# Patient Record
Sex: Male | Born: 1960 | Race: Black or African American | Hispanic: No | Marital: Married
Health system: Midwestern US, Academic
[De-identification: ages and names within clinical notes are randomized; demographics above are authoritative.]

## PROBLEM LIST (undated history)

## (undated) DIAGNOSIS — M4800 Spinal stenosis, site unspecified: Secondary | ICD-10-CM

## (undated) DIAGNOSIS — F329 Major depressive disorder, single episode, unspecified: Secondary | ICD-10-CM

## (undated) DIAGNOSIS — M5430 Sciatica, unspecified side: Secondary | ICD-10-CM

## (undated) DIAGNOSIS — F32A Depression, unspecified: Secondary | ICD-10-CM

## (undated) DIAGNOSIS — Z77120 Contact with and (suspected) exposure to mold (toxic): Secondary | ICD-10-CM

## (undated) DIAGNOSIS — K5790 Diverticulosis of intestine, part unspecified, without perforation or abscess without bleeding: Secondary | ICD-10-CM

## (undated) DIAGNOSIS — E785 Hyperlipidemia, unspecified: Secondary | ICD-10-CM

## (undated) DIAGNOSIS — I10 Essential (primary) hypertension: Secondary | ICD-10-CM

## (undated) DIAGNOSIS — M2021 Hallux rigidus, right foot: Secondary | ICD-10-CM

## (undated) HISTORY — PX: KNEE ARTHROSCOPY: SUR90

## (undated) HISTORY — DX: Diverticulosis of intestine, part unspecified, without perforation or abscess without bleeding: K57.90

## (undated) HISTORY — PX: SPINE SURGERY: SHX786

## (undated) HISTORY — PX: BACK SURGERY: SHX140

## (undated) HISTORY — DX: Essential (primary) hypertension: I10

## (undated) HISTORY — DX: Major depressive disorder, single episode, unspecified: F32.9

## (undated) HISTORY — DX: Hyperlipidemia, unspecified: E78.5

## (undated) HISTORY — DX: Depression, unspecified: F32.A

## (undated) HISTORY — DX: Sciatica, unspecified side: M54.30

---

## 2000-01-22 ENCOUNTER — Emergency Department (HOSPITAL_COMMUNITY): Admission: EM | Admit: 2000-01-22 | Discharge: 2000-01-22 | Payer: Self-pay | Admitting: Emergency Medicine

## 2000-01-22 ENCOUNTER — Encounter: Payer: Self-pay | Admitting: Emergency Medicine

## 2000-07-08 ENCOUNTER — Encounter: Payer: Self-pay | Admitting: Emergency Medicine

## 2000-07-08 ENCOUNTER — Emergency Department (HOSPITAL_COMMUNITY): Admission: EM | Admit: 2000-07-08 | Discharge: 2000-07-08 | Payer: Self-pay | Admitting: Emergency Medicine

## 2000-08-23 ENCOUNTER — Ambulatory Visit (HOSPITAL_BASED_OUTPATIENT_CLINIC_OR_DEPARTMENT_OTHER): Admission: RE | Admit: 2000-08-23 | Discharge: 2000-08-23 | Payer: Self-pay | Admitting: Orthopedic Surgery

## 2001-10-17 HISTORY — PX: CERVICAL SPINE SURGERY: SHX589

## 2002-02-07 ENCOUNTER — Emergency Department (HOSPITAL_COMMUNITY): Admission: EM | Admit: 2002-02-07 | Discharge: 2002-02-07 | Payer: Self-pay | Admitting: *Deleted

## 2002-10-11 ENCOUNTER — Emergency Department (HOSPITAL_COMMUNITY): Admission: EM | Admit: 2002-10-11 | Discharge: 2002-10-11 | Payer: Self-pay | Admitting: Emergency Medicine

## 2002-10-26 ENCOUNTER — Encounter: Payer: Self-pay | Admitting: Internal Medicine

## 2002-10-26 ENCOUNTER — Encounter: Admission: RE | Admit: 2002-10-26 | Discharge: 2002-10-26 | Payer: Self-pay | Admitting: Internal Medicine

## 2002-12-10 ENCOUNTER — Encounter: Payer: Self-pay | Admitting: Neurosurgery

## 2002-12-10 ENCOUNTER — Ambulatory Visit (HOSPITAL_COMMUNITY): Admission: RE | Admit: 2002-12-10 | Discharge: 2002-12-10 | Payer: Self-pay | Admitting: Neurosurgery

## 2003-01-23 ENCOUNTER — Emergency Department (HOSPITAL_COMMUNITY): Admission: EM | Admit: 2003-01-23 | Discharge: 2003-01-23 | Payer: Self-pay | Admitting: Emergency Medicine

## 2003-01-23 ENCOUNTER — Encounter: Payer: Self-pay | Admitting: Emergency Medicine

## 2003-01-24 ENCOUNTER — Emergency Department (HOSPITAL_COMMUNITY): Admission: EM | Admit: 2003-01-24 | Discharge: 2003-01-24 | Payer: Self-pay | Admitting: Emergency Medicine

## 2003-02-03 ENCOUNTER — Encounter: Payer: Self-pay | Admitting: Neurosurgery

## 2003-02-05 ENCOUNTER — Ambulatory Visit (HOSPITAL_COMMUNITY): Admission: RE | Admit: 2003-02-05 | Discharge: 2003-02-06 | Payer: Self-pay | Admitting: Neurosurgery

## 2003-02-05 ENCOUNTER — Encounter: Payer: Self-pay | Admitting: Neurosurgery

## 2003-06-20 ENCOUNTER — Emergency Department (HOSPITAL_COMMUNITY): Admission: EM | Admit: 2003-06-20 | Discharge: 2003-06-21 | Payer: Self-pay

## 2003-08-08 ENCOUNTER — Emergency Department (HOSPITAL_COMMUNITY): Admission: EM | Admit: 2003-08-08 | Discharge: 2003-08-08 | Payer: Self-pay | Admitting: Emergency Medicine

## 2005-07-09 ENCOUNTER — Emergency Department (HOSPITAL_COMMUNITY): Admission: EM | Admit: 2005-07-09 | Discharge: 2005-07-09 | Payer: Self-pay | Admitting: Emergency Medicine

## 2005-09-28 ENCOUNTER — Ambulatory Visit: Payer: Self-pay | Admitting: Internal Medicine

## 2006-06-28 ENCOUNTER — Emergency Department (HOSPITAL_COMMUNITY): Admission: EM | Admit: 2006-06-28 | Discharge: 2006-06-29 | Payer: Self-pay | Admitting: Emergency Medicine

## 2006-06-30 ENCOUNTER — Emergency Department (HOSPITAL_COMMUNITY): Admission: EM | Admit: 2006-06-30 | Discharge: 2006-06-30 | Payer: Self-pay | Admitting: *Deleted

## 2006-07-05 ENCOUNTER — Emergency Department (HOSPITAL_COMMUNITY): Admission: EM | Admit: 2006-07-05 | Discharge: 2006-07-05 | Payer: Self-pay | Admitting: Emergency Medicine

## 2006-07-17 ENCOUNTER — Emergency Department (HOSPITAL_COMMUNITY): Admission: EM | Admit: 2006-07-17 | Discharge: 2006-07-17 | Payer: Self-pay | Admitting: Emergency Medicine

## 2007-05-15 ENCOUNTER — Emergency Department (HOSPITAL_COMMUNITY): Admission: EM | Admit: 2007-05-15 | Discharge: 2007-05-15 | Payer: Self-pay | Admitting: Emergency Medicine

## 2007-06-21 ENCOUNTER — Emergency Department (HOSPITAL_COMMUNITY): Admission: EM | Admit: 2007-06-21 | Discharge: 2007-06-21 | Payer: Self-pay | Admitting: Emergency Medicine

## 2007-10-20 ENCOUNTER — Emergency Department (HOSPITAL_COMMUNITY): Admission: EM | Admit: 2007-10-20 | Discharge: 2007-10-20 | Payer: Self-pay | Admitting: Emergency Medicine

## 2008-06-29 ENCOUNTER — Emergency Department (HOSPITAL_COMMUNITY): Admission: EM | Admit: 2008-06-29 | Discharge: 2008-06-29 | Payer: Self-pay | Admitting: Emergency Medicine

## 2011-01-23 ENCOUNTER — Emergency Department (HOSPITAL_COMMUNITY): Payer: Medicare Other

## 2011-01-23 ENCOUNTER — Emergency Department (HOSPITAL_COMMUNITY)
Admission: EM | Admit: 2011-01-23 | Discharge: 2011-01-23 | Disposition: A | Discharge status: Home / Self Care | Payer: Medicare Other | Attending: Emergency Medicine | Admitting: Emergency Medicine

## 2011-01-23 DIAGNOSIS — M25519 Pain in unspecified shoulder: Secondary | ICD-10-CM | POA: Insufficient documentation

## 2011-01-23 DIAGNOSIS — M5412 Radiculopathy, cervical region: Secondary | ICD-10-CM | POA: Insufficient documentation

## 2011-01-23 DIAGNOSIS — F319 Bipolar disorder, unspecified: Secondary | ICD-10-CM | POA: Insufficient documentation

## 2011-01-23 DIAGNOSIS — M542 Cervicalgia: Secondary | ICD-10-CM | POA: Insufficient documentation

## 2011-03-04 NOTE — Op Note (Signed)
[place]  Patient:    Adrian Cox, Adrian Cox                      MRN: [id number] Proc. Date: 08/23/00 Adm. Date:  [id number] Attending:  Alinda Deem                           Operative Report  PREOPERATIVE DIAGNOSIS:  Right knee lateral meniscal tear.  POSTOPERATIVE DIAGNOSIS:  Right knee lateral meniscal tear.  PROCEDURE:  Arthroscopic removal of bucket-handle tear of the right lateral meniscus.  SURGEON:  Alinda Deem, M.D.  FIRST ASSISTANT:  Dorthula Matas, P.A.-C.  ANESTHESIA:  General LMA.  ESTIMATED BLOOD LOSS:  Minimal.  FLUID REPLACEMENT:  800 cc crystalloid.  DRAINS:  None.  TOURNIQUET TIME:  None.  INDICATIONS FOR PROCEDURE:  A 50 year old man who injured his knee a few weeks ago and has had persistent effusions and lateral joint line pain as well as a palpable mass consistent with a bucket handle tear of the lateral meniscus. He is taken for arthroscopic evaluation and treatment of same.  Risks and benefits of surgery understood by the patient.  He has significant pain which prevents normal activities and has an effusion.  DESCRIPTION OF PROCEDURE:  Patient identified by arm band, taken to the operating room at [facility], appropriate anesthetic monitor attached, and general LMA anesthesia induced with the patient in the supine position.  A lateral post was applied to the table and the right lower extremity prepped and draped in the usual sterile fashion from the ankle to the mid-thigh.  We began the procedure by making standard inferomedial and inferolateral peripatellar portals with a #11 blade, allowing introduction of the arthroscope with the inflow attached to the inferolateral portal and the outflow to the inferomedial portal.  The suprapatellar pouch, patella, trochlea, and medial compartment were in excellent condition, as were the cruciate ligaments.  Moving into the lateral compartment,  we immediately identified incarcerated bucket-handle tear of the lateral meniscus that went from posterior medial all the way around to anterolateral, including pretty much the entire rim of the lateral meniscus.  We began the treatment by cutting the attachment of the bucket-handle tear posterior medially, and then we made a supplemental anterolateral portal allowing introduction of a small knife and amputated the lateral limb of the bucket-handle tear, which was then extracted through the inferomedial portal.  A 4.2 mm Great White sucker shaver was then brought up to trim up any loose ends of the meniscal tear, which was smoothed and scalloped as necessary.  The articular cartilage was in excellent condition, and the cruciates were once again evaluated and found to be in good shape.  The gutters were cleared.  The scope was taken medial and lateral to the PCL, looking for any loose bodies in the posterior compartment.  At this point the knee was washed out with normal saline solution, the arthroscopic instruments removed, and a dressing of Xeroform, followed by dressing sponges, Webril, and an Ace wrap applied.  None of the portals were sutured, since none were over a quarter-inch in length.  The patient tolerated this procedure well, was awakened, and taken to the recovery room without difficulty. DD:  08/23/00 TD:  08/24/00 Job: [id number]

## 2011-03-04 NOTE — Op Note (Signed)
NAME:  Adrian Cox, Adrian Cox                         ACCOUNT NO.:  [id number]   MEDICAL RECORD NO.:  [id number]                   PATIENT TYPE:  OIB   LOCATION:  3007                                 FACILITY:  [facility]   PHYSICIAN:  Coletta Memos, M.D.                  DATE OF BIRTH:  22-Sep-1961   DATE OF PROCEDURE:  02/05/2003  DATE OF DISCHARGE:                                 OPERATIVE REPORT   PREOPERATIVE DIAGNOSIS:  1. Displaced disk C6-7 left.  2. Left cervical radiculopathy.   POSTOPERATIVE DIAGNOSIS:  1. Displaced disk C6-7 left.  2. Left cervical radiculopathy.   OPERATION PERFORMED:  Anterior cervical decompression of C6-7, arthrodesis  C6-7 with interbody plug, anterior plating Synthes small stature 18 mm  plate.   SURGEON:  Coletta Memos, M.D.   ANESTHESIA:  General.   ASSISTANT:  Hewitt Shorts, M.D.   COMPLICATIONS:  None.   INDICATIONS FOR PROCEDURE:  The patient is a 50 year old with a large disk  at C6-7 on the left side causing left upper extremity pain.  After  conservative treatment has failed and after consideration on his part, he  has agreed to undergo an anterior cervical decompression and arthrodesis.   DESCRIPTION OF PROCEDURE:  The patient was then draped in sterile fashion.  I opened the skin incision with a #10 blade and took this down to the  platysma.  I opened the platysma in a horizontal fashion using Metzenbaum  scissors in line with the incision.  The incision was made from the midline  to the medial border of the left sternocleidomastoid muscle approximately  three fingerbreadths above the sternal notch.  After dissecting rostrally  and caudally underneath the platysma, I then created an avascular plane  between the medial strap muscles and the sternocleidomastoid muscle  laterally.  The carotid artery was brought to the lateral aspect of the  wound.  I identified the anterior cervical spine and placed a spinal needle.  It showed that I  was at C6-7.  I then opened the disk space to mark it.  I  then resected the longus colli muscles using monopolar cautery.  Placed self-  retaining Caspar retractors to expose the disk space.  I then used a 15  blade to remove more of the disk and end plate and then curets.  Pituitary  rongeur was also used as was the Kerrison punches.  I then placed  distraction pins one in C6, one in C7 and opened the disk space further.  I  brought the microscope into the field and using that, removed yet more disk  material.  I used high speed air drill to remove osteophytes.  I  decompressed the nerve roots.  I was able to inspect and felt that both  nerve roots were very well decompressed.  I then fashioned the end plates  using a barrel-shaped bur and a drill.  I then placed an 8 mm bone graft  into the space.  The plate and screws were then placed, two screws in C6,  two screws in C7 first by drilling and tapping, locking screws placed.  X-  ray showed the plate and plug to be in good  position.  I then irrigated the wound.  I then closed the wound in layered  fashion using Vicryl sutures to reapproximate platysma, then subcutaneous  tissues.  Sterile dressing with Dermabond.  The patient tolerated the  procedure without difficulty.  Awakened he was moving all extremities.                                                Coletta Memos, M.D.    KC/MEDQ  D:  02/05/2003  T:  02/05/2003  Job:  [id number]

## 2011-07-03 ENCOUNTER — Emergency Department (HOSPITAL_COMMUNITY)
Admission: EM | Admit: 2011-07-03 | Discharge: 2011-07-03 | Disposition: A | Discharge status: Home / Self Care | Payer: Medicare Other | Attending: Emergency Medicine | Admitting: Emergency Medicine

## 2011-07-03 DIAGNOSIS — M25519 Pain in unspecified shoulder: Secondary | ICD-10-CM | POA: Insufficient documentation

## 2011-07-03 DIAGNOSIS — F319 Bipolar disorder, unspecified: Secondary | ICD-10-CM | POA: Insufficient documentation

## 2011-07-03 DIAGNOSIS — Z79899 Other long term (current) drug therapy: Secondary | ICD-10-CM | POA: Insufficient documentation

## 2011-12-21 ENCOUNTER — Ambulatory Visit (INDEPENDENT_AMBULATORY_CARE_PROVIDER_SITE_OTHER): Payer: Medicare Other | Admitting: Family Medicine

## 2011-12-21 VITALS — BP 146/88 | HR 94 | Temp 98.3°F | Resp 20 | Ht 72.5 in | Wt 224.0 lb

## 2011-12-21 DIAGNOSIS — K625 Hemorrhage of anus and rectum: Secondary | ICD-10-CM

## 2011-12-21 DIAGNOSIS — R5381 Other malaise: Secondary | ICD-10-CM

## 2011-12-21 DIAGNOSIS — B356 Tinea cruris: Secondary | ICD-10-CM

## 2011-12-21 DIAGNOSIS — I10 Essential (primary) hypertension: Secondary | ICD-10-CM

## 2011-12-21 DIAGNOSIS — K639 Disease of intestine, unspecified: Secondary | ICD-10-CM

## 2011-12-21 DIAGNOSIS — I517 Cardiomegaly: Secondary | ICD-10-CM

## 2011-12-21 DIAGNOSIS — R5383 Other fatigue: Secondary | ICD-10-CM

## 2011-12-21 DIAGNOSIS — E785 Hyperlipidemia, unspecified: Secondary | ICD-10-CM

## 2011-12-21 LAB — POCT CBC
HCT, POC: 47.7 % (ref 43.5–53.7)
Lymph, poc: 3 (ref 0.6–3.4)
MCH, POC: 31.1 pg (ref 27–31.2)
MCHC: 31.7 g/dL — AB (ref 31.8–35.4)
MCV: 98.3 fL — AB (ref 80–97)
POC Granulocyte: 10 — AB (ref 2–6.9)
POC LYMPH PERCENT: 22.1 %L (ref 10–50)
RDW, POC: 13.8 %
WBC: 13.7 10*3/uL — AB (ref 4.6–10.2)

## 2011-12-21 LAB — POCT UA - MICROSCOPIC ONLY
Casts, Ur, LPF, POC: NEGATIVE
Crystals, Ur, HPF, POC: NEGATIVE
Mucus, UA: NEGATIVE
WBC, Ur, HPF, POC: NEGATIVE
Yeast, UA: NEGATIVE

## 2011-12-21 LAB — POCT URINALYSIS DIPSTICK
Blood, UA: NEGATIVE
Leukocytes, UA: NEGATIVE
Nitrite, UA: NEGATIVE
Urobilinogen, UA: 1
pH, UA: 5

## 2011-12-21 MED ORDER — TERBINAFINE HCL 250 MG PO TABS: 250.0000 mg | ORAL_TABLET | Freq: Every day | ORAL | Status: AC

## 2011-12-21 MED ORDER — BUDESONIDE 32 MCG/ACT NA SUSP: 2.0000 | Freq: Every day | NASAL | Status: DC

## 2011-12-21 NOTE — Progress Notes (Signed)
History  Adrian Cox is here for his first time. He is 51 years of age. He is on a couple of medicines for his nerves, Abilify and Wellbutrin, and has had problems with depression and bipolar disorder. He is on disability, and is very sedentary in his life. He just does not have energy to do anything much. He is to keep medical problems today primarily involve a rash in his groin and in his tiredness. He has problem with tingling in his shoulders, having had a history of cervical disc disease and a titanium plate in his neck.  Past medical history: History of high blood pressure, high cholesterol, depression, and bipolar. History of several surgeries on his spine and knees  Social history: He is married no children does not smoke drink or use drugs has a high school education, not employed, does not do regular exercise although he does walk some.  Family history both parents are deceased. Father died from dementia dementia, and his mother had colon cancer.  Review of systems: Patient has had a previous colonoscopy in 2008 or blood in his stool He has some general weakness. He has night sweats at times. He does have some problems sleeping. He has lost some weight. HEENT: He has had some problems with his sinuses. He does wear glasses. His last eye exam was one year ago. Respiratory unremarkable Cardiovascular unremarkable Gastrointestinal: Appetite is diminished from what it used to be. He has been seeing some blood in his stool. Sometimes it is when he wipes, but there is more on the stool and in the toilet bowel. He gets some cramping abdominal pain. He has had a history of some constipation. No history of diabetes or thyroid problems.  Genitourinary is normal with the except of a shunt of some sexual difficulties. Musculoskeletal he has the numbness of his shoulders and upper arm portions as noted above. Neurologic normal Psychiatric carries the diagnosis of bipolar as noted above. Hematologic  unremarkable.  Physical examination  pleasant alert Afro-American male has a hand full of dread locks and a white beard. Eyes PERRLA throat clear neck supple without significant nodes or thyromegaly no carotid bruits. Chest is clear. Heart regular without murmurs. Abdomen soft without organomegaly masses or tenderness. Normal male external genitalia. Has some jock itch type rash. Digital rectal exam reveals prostate gland is normal. A smear was taken for hemasure Extremities unremarkable.  Assessment: Fatigue Tinea cruris Bipolar Sleep disturbance Chronic sinusitis Decreased appetite Right rectal bleeding Cervical discomfort The EEG History of high blood pressure History of high cholesterol  Plan: Check labs. Treat the rash. Discussed the cervical discomfort.  Results for orders placed in visit on 12/21/11  POCT CBC      Component Value Range   WBC 13.7 (*) 4.6 - 10.2 (K/uL)   Lymph, poc 3.0  0.6 - 3.4    POC LYMPH PERCENT 22.1  10 - 50 (%L)   MID (cbc) 0.7  0 - 0.9    POC MID % 5.0  0 - 12 (%M)   POC Granulocyte 10.0 (*) 2 - 6.9    Granulocyte percent 72.9  37 - 80 (%G)   RBC 4.85  4.69 - 6.13 (M/uL)   Hemoglobin 15.1  14.1 - 18.1 (g/dL)   HCT, POC 45.4  09.8 - 53.7 (%)   MCV 98.3 (*) 80 - 97 (fL)   MCH, POC 31.1  27 - 31.2 (pg)   MCHC 31.7 (*) 31.8 - 35.4 (g/dL)   RDW,  POC 13.8     Platelet Count, POC 263  142 - 424 (K/uL)   MPV 10.4  0 - 99.8 (fL)  POCT UA - MICROSCOPIC ONLY      Component Value Range   WBC, Ur, HPF, POC neg     RBC, urine, microscopic 0-1     Bacteria, U Microscopic neg     Mucus, UA neg     Epithelial cells, urine per micros 0-1     Crystals, Ur, HPF, POC neg     Casts, Ur, LPF, POC neg     Yeast, UA neg    POCT URINALYSIS DIPSTICK      Component Value Range   Color, UA yellow     Clarity, UA clear     Glucose, UA neg     Bilirubin, UA neg     Ketones, UA trace     Spec Grav, UA 1.020     Blood, UA neg     pH, UA 5.0     Protein, UA  trace     Urobilinogen, UA 1.0     Nitrite, UA neg     Leukocytes, UA Negative    POCT GLYCOSYLATED HEMOGLOBIN (HGB A1C)      Component Value Range   Hemoglobin A1C 5.0    IFOBT (OCCULT BLOOD)      Component Value Range   IFOBT Positive

## 2011-12-21 NOTE — Patient Instructions (Signed)
We are referring you to a cardiologist for evaluation of your heart. You have evidence of possible enlargement of the heart and this should be checked.  We are referring you to a gastroenterologist for reevaluation of your colon since there is blood in your stool.  Take the medications one daily for 2 weeks for the jock itch.

## 2011-12-22 ENCOUNTER — Encounter: Payer: Self-pay | Admitting: Family Medicine

## 2011-12-22 LAB — LIPID PANEL
Cholesterol: 181 mg/dL (ref 0–200)
HDL: 30 mg/dL — ABNORMAL LOW (ref >39)
Total CHOL/HDL Ratio: 6 Ratio
VLDL: 45 mg/dL — ABNORMAL HIGH (ref 0–40)

## 2011-12-22 LAB — COMPREHENSIVE METABOLIC PANEL
ALT: 30 U/L (ref 0–53)
AST: 25 U/L (ref 0–37)
BUN: 9 mg/dL (ref 6–23)
Calcium: 9.9 mg/dL (ref 8.4–10.5)
Creat: 0.92 mg/dL (ref 0.50–1.35)
Total Bilirubin: 0.9 mg/dL (ref 0.3–1.2)

## 2011-12-22 LAB — PSA: PSA: 1.2 ng/mL (ref <=4.00)

## 2011-12-22 LAB — TSH: TSH: 0.62 u[IU]/mL (ref 0.350–4.500)

## 2011-12-27 ENCOUNTER — Telehealth: Payer: Self-pay

## 2011-12-27 DIAGNOSIS — K625 Hemorrhage of anus and rectum: Secondary | ICD-10-CM

## 2011-12-27 NOTE — Telephone Encounter (Signed)
.  [facility] PT STATES HE WAS REFERRED TO HAVE A COLONOSPY DONE BUT THEY DON'T TAKE MEDICARE COMPLETE WOULD LIKE TO HAVE ANOTHER REFERRAL. PLEASE CALL [phone]

## 2011-12-28 NOTE — Telephone Encounter (Signed)
Dr Alwyn Ren please make another choice for this patient to have a colonoscopy that accept medicare complete

## 2012-01-02 NOTE — Telephone Encounter (Signed)
Can we order the referral for pt?

## 2012-01-02 NOTE — Telephone Encounter (Signed)
Spoke with patient, he states that he does not need new referral-- he is going to Dr. Elnoria Howard.  Pls d/c recent request.

## 2012-01-02 NOTE — Telephone Encounter (Signed)
Pt can go anywhere that takes his insurance.  I did the referral.

## 2012-01-02 NOTE — Telephone Encounter (Signed)
[place]?  I really do not care which place if we can find one that meets his needs.

## 2012-08-10 ENCOUNTER — Ambulatory Visit (INDEPENDENT_AMBULATORY_CARE_PROVIDER_SITE_OTHER): Payer: Medicare Other | Admitting: Family Medicine

## 2012-08-10 VITALS — BP 134/76 | HR 92 | Temp 98.4°F | Resp 18 | Ht 72.0 in | Wt 238.0 lb

## 2012-08-10 DIAGNOSIS — M549 Dorsalgia, unspecified: Secondary | ICD-10-CM

## 2012-08-10 DIAGNOSIS — N529 Male erectile dysfunction, unspecified: Secondary | ICD-10-CM

## 2012-08-10 DIAGNOSIS — J309 Allergic rhinitis, unspecified: Secondary | ICD-10-CM

## 2012-08-10 MED ORDER — SILDENAFIL CITRATE 100 MG PO TABS: 50.0000 mg | ORAL_TABLET | Freq: Every day | ORAL | Status: DC | PRN

## 2012-08-10 MED ORDER — MELOXICAM 7.5 MG PO TABS: 7.5000 mg | ORAL_TABLET | Freq: Every day | ORAL | Status: DC

## 2012-08-10 MED ORDER — FLUTICASONE PROPIONATE 50 MCG/ACT NA SUSP: 2.0000 | Freq: Every day | NASAL | Status: DC

## 2012-08-10 MED ORDER — CYCLOBENZAPRINE HCL 10 MG PO TABS: 10.0000 mg | ORAL_TABLET | Freq: Every evening | ORAL | Status: DC | PRN

## 2012-08-10 NOTE — Progress Notes (Signed)
[facility]:  Office Visit  Chief Complaint:  Chief Complaint  Patient presents with  . Sinus Problem  . Back Pain    lower back (esp when twist or bend)  . Erectile Dysfunction    HPI: Adrian Cox is a 51 y.o. male who complains of: 1. Sinusitis and rhinorhea sxs on rhinocort but needs something less expensive b/c of cost. + sinus congestion. Stopped taking it after March.  2. Erectile dysfunction x 1 month, has had problems with getting erection and maintaing erection. Deneis diabetes. Has been on Abilify for 2 years for Bipolar.  3. Back pain x 1 month, does a lot of bending and works at [employer]. Right lower back without numbness or tingling. OTC NSAID. Had back surgery 20 years ago. Had spinal surgery, lower back disc removed.   Past Medical History  Diagnosis Date  . Hypertension   . Hyperlipidemia   . Depression     bipolar   Past Surgical History  Procedure Date  . Spine surgery   . Knees   . Cervical spine surgery 2003    fusion   History   Social History  . Marital Status: Married    Spouse Name: N/A    Number of Children: N/A  . Years of Education: N/A   Social History Main Topics  . Smoking status: Never Smoker   . Smokeless tobacco: None  . Alcohol Use: No  . Drug Use: No  . Sexually Active: Yes   Other Topics Concern  . None   Social History Narrative   Patient is married, has no children. He does not smoke drink or use any substances. He has a high school education. He is currently not employed. He does some walking, but really does not get a lot of regular exercise. He has membership at [employer] but he doesn't use it   Family History  Problem Relation Age of Onset  . Cancer Mother    No Known Allergies Prior to Admission medications   Medication Sig Start Date End Date Taking? Authorizing Provider  ARIPiprazole (ABILIFY) 10 MG tablet Take 10 mg by mouth daily.   Yes Historical Provider, MD  chlorthalidone (HYGROTON)  25 MG tablet Take 25 mg by mouth daily.   Yes Historical Provider, MD  budesonide (RHINOCORT AQUA) 32 MCG/ACT nasal spray Place 2 sprays into the nose daily. 12/21/11 12/20/12  Peyton Najjar, MD  buPROPion (WELLBUTRIN) 100 MG tablet Take 100 mg by mouth 2 (two) times daily.    Historical Provider, MD  terbinafine (LAMISIL) 250 MG tablet Take 1 tablet (250 mg total) by mouth daily. 12/21/11 12/20/12  Peyton Najjar, MD     ROS: The patient denies fevers, chills, night sweats, unintentional weight loss, chest pain, palpitations, wheezing, dyspnea on exertion, nausea, vomiting, abdominal pain, dysuria, hematuria, melena, numbness, weakness, or tingling.   All other systems have been reviewed and were otherwise negative with the exception of those mentioned in the HPI and as above.    PHYSICAL EXAM: Filed Vitals:   08/10/12 1353  BP: 134/76  Pulse: 92  Temp: 98.4 F (36.9 C)  Resp: 18   Filed Vitals:   08/10/12 1353  Height: 6' (1.829 m)  Weight: 238 lb (107.956 kg)   Body mass index is 32.28 kg/(m^2).  General: Alert, no acute distress HEENT:  Normocephalic, atraumatic, oropharynx patent.  Cardiovascular:  Regular rate and rhythm, no rubs murmurs or gallops.  No Carotid bruits, radial  pulse intact. No pedal edema.  Respiratory: Clear to auscultation bilaterally.  No wheezes, rales, or rhonchi.  No cyanosis, no use of accessory musculature GI: No organomegaly, abdomen is soft and non-tender, positive bowel sounds.  No masses. Skin: No rashes. Neurologic: Facial musculature symmetric. Psychiatric: Patient is appropriate throughout our interaction. Lymphatic: No cervical lymphadenopathy Musculoskeletal: Gait intact. Right para spinal msk tenderness ROMintact but sight pain with ROM 2/2 DTR 5/5 Strength Bilateral hips nl Straight leg + right   LABS: Results for orders placed in visit on 12/21/11  POCT CBC      Component Value Range   WBC 13.7 (*) 4.6 - 10.2 K/uL   Lymph, poc 3.0   0.6 - 3.4   POC LYMPH PERCENT 22.1  10 - 50 %L   MID (cbc) 0.7  0 - 0.9   POC MID % 5.0  0 - 12 %M   POC Granulocyte 10.0 (*) 2 - 6.9   Granulocyte percent 72.9  37 - 80 %G   RBC 4.85  4.69 - 6.13 M/uL   Hemoglobin 15.1  14.1 - 18.1 g/dL   HCT, POC 16.1  09.6 - 53.7 %   MCV 98.3 (*) 80 - 97 fL   MCH, POC 31.1  27 - 31.2 pg   MCHC 31.7 (*) 31.8 - 35.4 g/dL   RDW, POC 04.5     Platelet Count, POC 263  142 - 424 K/uL   MPV 10.4  0 - 99.8 fL  POCT UA - MICROSCOPIC ONLY      Component Value Range   WBC, Ur, HPF, POC neg     RBC, urine, microscopic 0-1     Bacteria, U Microscopic neg     Mucus, UA neg     Epithelial cells, urine per micros 0-1     Crystals, Ur, HPF, POC neg     Casts, Ur, LPF, POC neg     Yeast, UA neg    POCT URINALYSIS DIPSTICK      Component Value Range   Color, UA yellow     Clarity, UA clear     Glucose, UA neg     Bilirubin, UA neg     Ketones, UA trace     Spec Grav, UA 1.020     Blood, UA neg     pH, UA 5.0     Protein, UA trace     Urobilinogen, UA 1.0     Nitrite, UA neg     Leukocytes, UA Negative    COMPREHENSIVE METABOLIC PANEL      Component Value Range   Sodium 141  135 - 145 mEq/L   Potassium 4.4  3.5 - 5.3 mEq/L   Chloride 103  96 - 112 mEq/L   CO2 26  19 - 32 mEq/L   Glucose, Bld 93  70 - 99 mg/dL   BUN 9  6 - 23 mg/dL   Creat 4.09  8.11 - 9.14 mg/dL   Total Bilirubin 0.9  0.3 - 1.2 mg/dL   Alkaline Phosphatase 86  39 - 117 U/L   AST 25  0 - 37 U/L   ALT 30  0 - 53 U/L   Total Protein 8.0  6.0 - 8.3 g/dL   Albumin 4.7  3.5 - 5.2 g/dL   Calcium 9.9  8.4 - 78.2 mg/dL  LIPID PANEL      Component Value Range   Cholesterol 181  0 - 200 mg/dL   Triglycerides 956 (*) <  150 mg/dL   HDL 30 (*) >16 mg/dL   Total CHOL/HDL Ratio 6.0     VLDL 45 (*) 0 - 40 mg/dL   LDL Cholesterol 109 (*) 0 - 99 mg/dL  TSH      Component Value Range   TSH 0.620  0.350 - 4.500 uIU/mL  PSA      Component Value Range   PSA 1.20  <=4.00 ng/mL  POCT  GLYCOSYLATED HEMOGLOBIN (HGB A1C)      Component Value Range   Hemoglobin A1C 5.0    IFOBT (OCCULT BLOOD)      Component Value Range   IFOBT Positive       EKG/XRAY:   Primary read interpreted by Dr. Conley Rolls at [facility].   ASSESSMENT/PLAN: Encounter Diagnoses  Name Primary?  . Back pain Yes  . Allergic rhinitis   . Erectile dysfunction    Rx Flonase preferred since will be covered by Medicare Rx Mobic with precuations, ROM exercises given, f/u in 2-4 weeks if no improvement or worsening sxs. No xrays for now since just msk sprain/strain. Rx Viagra for ED.    Hamilton Capri PHUONG, DO 08/10/2012 2:42 PM

## 2012-09-07 ENCOUNTER — Ambulatory Visit (INDEPENDENT_AMBULATORY_CARE_PROVIDER_SITE_OTHER): Payer: Medicare Other | Admitting: Family Medicine

## 2012-09-07 ENCOUNTER — Ambulatory Visit: Payer: Medicare Other

## 2012-09-07 VITALS — BP 128/79 | HR 90 | Temp 98.1°F | Resp 18 | Ht 72.0 in | Wt 235.8 lb

## 2012-09-07 DIAGNOSIS — R05 Cough: Secondary | ICD-10-CM

## 2012-09-07 DIAGNOSIS — R042 Hemoptysis: Secondary | ICD-10-CM

## 2012-09-07 DIAGNOSIS — R059 Cough, unspecified: Secondary | ICD-10-CM

## 2012-09-07 DIAGNOSIS — J40 Bronchitis, not specified as acute or chronic: Secondary | ICD-10-CM

## 2012-09-07 LAB — POCT CBC
Hemoglobin: 15.5 g/dL (ref 14.1–18.1)
Lymph, poc: 4.5 — AB (ref 0.6–3.4)
MCH, POC: 30.9 pg (ref 27–31.2)
MCHC: 31.6 g/dL — AB (ref 31.8–35.4)
MID (cbc): 0.7 (ref 0–0.9)
MPV: 10.2 fL (ref 0–99.8)
POC Granulocyte: 9.7 — AB (ref 2–6.9)
POC MID %: 4.5 %M (ref 0–12)
Platelet Count, POC: 317 10*3/uL (ref 142–424)
WBC: 14.9 10*3/uL — AB (ref 4.6–10.2)

## 2012-09-07 MED ORDER — ALBUTEROL SULFATE HFA 108 (90 BASE) MCG/ACT IN AERS: 2.0000 | INHALATION_SPRAY | Freq: Four times a day (QID) | RESPIRATORY_TRACT | Status: DC | PRN

## 2012-09-07 MED ORDER — LEVOFLOXACIN 500 MG PO TABS: 500.0000 mg | ORAL_TABLET | Freq: Every day | ORAL | Status: DC

## 2012-09-07 MED ORDER — BENZONATATE 100 MG PO CAPS: ORAL_CAPSULE | ORAL | Status: DC

## 2012-09-07 NOTE — Patient Instructions (Addendum)
Drink plenty of fluids  Get sufficient rest  Use the medications as ordered  Return if in all worse or if coughing up blood persists

## 2012-09-07 NOTE — Progress Notes (Signed)
Subjective: 51 year old man with history of having a cough for almost 2 weeks. He's been coughing hard at times. Today he has had about 3 instances of coughing up red blood. He does not smoke, and has not been a smoker. He is not until excessive number of coughs. He has not had your pain or head congestion or sore throat to speak of. He has not been feverish.  On disability for a metal plate in his neck.  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  Has a daughter and wife with him.  Objective: Afro-American male in no major acute distress. TMs are normal. Throat has minimal erythema on the right side, probably from a heart coughing. Neck was supple without nodes. Chest is clear to auscultation. He has minimal wheezes at end expiration of forced expiration, and it triggers a cough. Heart regular without murmurs  Assessment: Hemoptysis Cough History of metal plate and neck  Plan: Chest x-ray and CBC  [facility] reading (PRIMARY) by  Dr. Alwyn Ren Negative chest  Results for orders placed in visit on 09/07/12  POCT CBC      Component Value Range   WBC 14.9 (*) 4.6 - 10.2 K/uL   Lymph, poc 4.5 (*) 0.6 - 3.4   POC LYMPH PERCENT 30.4  10 - 50 %L   MID (cbc) 0.7  0 - 0.9   POC MID % 4.5  0 - 12 %M   POC Granulocyte 9.7 (*) 2 - 6.9   Granulocyte percent 65.1  37 - 80 %G   RBC 5.02  4.69 - 6.13 M/uL   Hemoglobin 15.5  14.1 - 18.1 g/dL   HCT, POC 45.4  09.8 - 53.7 %   MCV 97.7 (*) 80 - 97 fL   MCH, POC 30.9  27 - 31.2 pg   MCHC 31.6 (*) 31.8 - 35.4 g/dL   RDW, POC 11.9     Platelet Count, POC 317  142 - 424 K/uL   MPV 10.2  0 - 99.8 fL    Impression: Bronchitis Hemoptysisi  rx abx If blood persists rtc.

## 2013-08-22 ENCOUNTER — Other Ambulatory Visit: Payer: Self-pay | Admitting: Family Medicine

## 2013-12-27 ENCOUNTER — Emergency Department (INDEPENDENT_AMBULATORY_CARE_PROVIDER_SITE_OTHER)
Admission: EM | Admit: 2013-12-27 | Discharge: 2013-12-27 | Disposition: A | Discharge status: Home / Self Care | Payer: Medicare Other | Source: Home / Self Care | Attending: Emergency Medicine | Admitting: Emergency Medicine

## 2013-12-27 ENCOUNTER — Encounter (HOSPITAL_COMMUNITY): Payer: Self-pay | Admitting: Emergency Medicine

## 2013-12-27 DIAGNOSIS — M545 Low back pain, unspecified: Secondary | ICD-10-CM

## 2013-12-27 HISTORY — DX: Contact with and (suspected) exposure to mold (toxic): Z77.120

## 2013-12-27 LAB — POCT URINALYSIS DIP (DEVICE)
BILIRUBIN URINE: NEGATIVE
Glucose, UA: NEGATIVE mg/dL
HGB URINE DIPSTICK: NEGATIVE
KETONES UR: NEGATIVE mg/dL
Leukocytes, UA: NEGATIVE
Nitrite: NEGATIVE
PH: 7.5 (ref 5.0–8.0)
Protein, ur: NEGATIVE mg/dL
SPECIFIC GRAVITY, URINE: 1.015 (ref 1.005–1.030)
Urobilinogen, UA: 1 mg/dL (ref 0.0–1.0)

## 2013-12-27 MED ORDER — HYDROCODONE-ACETAMINOPHEN 5-325 MG PO TABS: ORAL_TABLET | ORAL | Status: DC

## 2013-12-27 MED ORDER — CYCLOBENZAPRINE HCL 5 MG PO TABS: 5.0000 mg | ORAL_TABLET | Freq: Three times a day (TID) | ORAL | Status: DC | PRN

## 2013-12-27 MED ORDER — DICLOFENAC SODIUM 75 MG PO TBEC: 75.0000 mg | DELAYED_RELEASE_TABLET | Freq: Two times a day (BID) | ORAL | Status: DC

## 2013-12-27 NOTE — Discharge Instructions (Signed)
Do exercises twice daily followed by moist heat for 15 minutes. ° ° ° ° ° °Try to be as active as possible. ° °If no better in 2 weeks, follow up with orthopedist. ° ° °

## 2013-12-27 NOTE — ED Notes (Signed)
Describes a "soreness" in right hip intermittently x 1 month with some low back pain; yesterday after using treadmill pain was worse than usual, but continues to be intermittent.  C/O frontal HA, night sweats (but describes 80 degree house with heavy covers).  Has tried soaking in hot tub and ASA.  Has hx of HTN - has been on & off meds for several years; was most recently taken off meds 1 yr ago per PCP.

## 2013-12-27 NOTE — ED Provider Notes (Signed)
Chief Complaint   Chief Complaint  Patient presents with  . Back Pain    History of Present Illness   Adrian Cox is a 53 year old male who has had a long-standing history of lower back pain going back to 2003 when he had a spinal fusion. He's had 2 surgeries on his lower back, one done by Dr. Doristine SectionVincent [name] and 1 by Dr. Barbaraann BarthelKyle Cabell. The pain has been controlled up until the last 2-3 days when it got worse after using a treadmill at his apartment complex. Right now the pain is rated 2-3/10 in intensity. His legs feel tired and tight. The pain is worse with prolonged sitting, when he stands up or use a treadmill and is better if he moves around. He denies any numbness or tingling in the legs, muscle weakness, bladder or bowel dysfunction, or loss of control of bladder or bowel. He has had some headaches. Denies any abdominal pain, fever, chills, or weight loss.  Review of Systems   Other than as noted above, the patient denies any of the following symptoms: Systemic:  No fever, chills, or unexplained weight loss. GI:  No abdominal painor incontinence of bowel. GU:  No dysuria, frequency, urgency, or hematuria. No incontinence of urine or urinary retention.  M-S:  No neck pain or arthritis. Neuro:  No paresthesias, headache, saddle anesthesia, muscular weakness, or progressive neurological deficit.  PMFSH   Past medical history, family history, social history, meds, and allergies were reviewed. Specifically, there is no history of cancer, major trauma, osteoporosis, immunosuppression, or HIV infection. He has high blood pressure.  Physical Examination    Vital signs:  BP 151/92  Pulse 96  Temp(Src) 98.6 F (37 C) (Oral)  Resp 16  SpO2 94% General:  Alert, oriented, in no distress. Abdomen:  Soft, non-tender.  No organomegaly or mass.  No pulsatile midline abdominal mass or bruit. Back:  His back has 90 of flexion, 20 of extension, 30 of lateral bending, and 90 of rotation with  pain. Straight leg raising is negative. Neuro:  Normal muscle strength, sensations and DTRs. Extremities: Pedal pulses were full, there was no edema. Skin:  Clear, warm and dry.  No rash.  Labs   Results for orders placed during the hospital encounter of 12/27/13  POCT URINALYSIS DIP (DEVICE)      Result Value Ref Range   Glucose, UA NEGATIVE  NEGATIVE mg/dL   Bilirubin Urine NEGATIVE  NEGATIVE   Ketones, ur NEGATIVE  NEGATIVE mg/dL   Specific Gravity, Urine 1.015  1.005 - 1.030   Hgb urine dipstick NEGATIVE  NEGATIVE   pH 7.5  5.0 - 8.0   Protein, ur NEGATIVE  NEGATIVE mg/dL   Urobilinogen, UA 1.0  0.0 - 1.0 mg/dL   Nitrite NEGATIVE  NEGATIVE   Leukocytes, UA NEGATIVE  NEGATIVE    Assessment   The encounter diagnosis was Lumbago.  No evidence of cauda equina syndrome. He may have spinal stenosis and I recommended a followup with Dr. Mikal Planeabell or Dr. Renae FicklePaul if no improvement in 2 weeks.  Plan     1.  Meds:  The following meds were prescribed:   Discharge Medication List as of 12/27/2013  7:20 PM    START taking these medications   Details  !! cyclobenzaprine (FLEXERIL) 5 MG tablet Take 1 tablet (5 mg total) by mouth 3 (three) times daily as needed for muscle spasms., Starting 12/27/2013, Until Discontinued, Normal    diclofenac (VOLTAREN) 75 MG EC tablet  Take 1 tablet (75 mg total) by mouth 2 (two) times daily., Starting 12/27/2013, Until Discontinued, Normal    HYDROcodone-acetaminophen (NORCO/VICODIN) 5-325 MG per tablet 1 to 2 tabs every 4 to 6 hours as needed for pain., Print     !! - Potential duplicate medications found. Please discuss with provider.      2.  Patient Education/Counseling:  The patient was given appropriate handouts, self care instructions, and instructed in symptomatic relief. The patient was encouraged to try to be as active as possible and given some exercises to do followed by moist heat.   3.  Follow up:  The patient was told to follow up here if no  better in 3 to 4 days, or sooner if becoming worse in any way, and given some red flag symptoms such as worsening pain or new neurological symptoms which would prompt immediate return.      Reuben Likes, MD 12/27/13 2147

## 2014-07-25 ENCOUNTER — Encounter (HOSPITAL_COMMUNITY): Payer: Self-pay | Admitting: Emergency Medicine

## 2014-07-25 ENCOUNTER — Emergency Department (HOSPITAL_COMMUNITY)
Admission: EM | Admit: 2014-07-25 | Discharge: 2014-07-25 | Disposition: A | Discharge status: Home / Self Care | Payer: Medicare Other | Attending: Emergency Medicine | Admitting: Emergency Medicine

## 2014-07-25 DIAGNOSIS — Z792 Long term (current) use of antibiotics: Secondary | ICD-10-CM | POA: Diagnosis not present

## 2014-07-25 DIAGNOSIS — I10 Essential (primary) hypertension: Secondary | ICD-10-CM | POA: Diagnosis not present

## 2014-07-25 DIAGNOSIS — J020 Streptococcal pharyngitis: Secondary | ICD-10-CM | POA: Diagnosis not present

## 2014-07-25 DIAGNOSIS — Z8639 Personal history of other endocrine, nutritional and metabolic disease: Secondary | ICD-10-CM | POA: Diagnosis not present

## 2014-07-25 DIAGNOSIS — Z79899 Other long term (current) drug therapy: Secondary | ICD-10-CM | POA: Insufficient documentation

## 2014-07-25 DIAGNOSIS — Z7951 Long term (current) use of inhaled steroids: Secondary | ICD-10-CM | POA: Insufficient documentation

## 2014-07-25 DIAGNOSIS — Z791 Long term (current) use of non-steroidal anti-inflammatories (NSAID): Secondary | ICD-10-CM | POA: Insufficient documentation

## 2014-07-25 DIAGNOSIS — F319 Bipolar disorder, unspecified: Secondary | ICD-10-CM | POA: Insufficient documentation

## 2014-07-25 DIAGNOSIS — J029 Acute pharyngitis, unspecified: Secondary | ICD-10-CM | POA: Diagnosis present

## 2014-07-25 LAB — RAPID STREP SCREEN (MED CTR MEBANE ONLY): Streptococcus, Group A Screen (Direct): POSITIVE — AB

## 2014-07-25 MED ORDER — PENICILLIN G BENZATHINE 1200000 UNIT/2ML IM SUSP
1.2000 10*6.[IU] | Freq: Once | INTRAMUSCULAR | Status: AC
  Administered 2014-07-25: 1.2 10*6.[IU] via INTRAMUSCULAR
  Filled 2014-07-25: qty 2

## 2014-07-25 NOTE — ED Provider Notes (Signed)
Medical screening examination/treatment/procedure(s) were performed by non-physician practitioner and as supervising physician I was immediately available for consultation/collaboration.   EKG Interpretation None      Nicky Kras, MD, FACEP   Oni Dietzman L Faryal Marxen, MD 07/25/14 1511 

## 2014-07-25 NOTE — ED Notes (Signed)
Reports sore throat, body aches, chills since Tuesday.

## 2014-07-25 NOTE — ED Provider Notes (Signed)
CSN: [id number]     Arrival date & time 07/25/14  [id number] History   First MD Initiated Contact with Patient 07/25/14 [phone]     Chief Complaint  Patient presents with  . Sore Throat     (Consider location/radiation/quality/duration/timing/severity/associated sxs/prior Treatment) HPI Comments: Patient presents today with a chief complaint of sore throat, body aches, and chills which have been present the past 3 days.  Symptoms gradually worsening.  He has not taken anything for his symptoms.  Sore throat becomes worse when swallowing.  He reports that his grand daughter was diagnosed with Strep throat 2 weeks ago.  He is unsure if he has had a fever, but reports that he has felt warm.  Temperature 99.7 F orally upon arrival in the ED.  Patient denies difficulty swallowing.  Denies cough, congestion, rhinorrhea, sinus pain, ear pain, nausea, or vomiting.    The history is provided by the patient.    Past Medical History  Diagnosis Date  . Hypertension   . Hyperlipidemia   . Depression     bipolar  . Mold exposure    Past Surgical History  Procedure Laterality Date  . Spine surgery    . Knees    . Cervical spine surgery  2003    fusion  . Back surgery     Family History  Problem Relation Age of Onset  . Cancer Mother    History  Substance Use Topics  . Smoking status: Never Smoker   . Smokeless tobacco: Not on file  . Alcohol Use: No    Review of Systems  Constitutional: Positive for fever.  HENT: Positive for sore throat. Negative for ear pain, postnasal drip, rhinorrhea, trouble swallowing and voice change.   Respiratory: Negative for cough and shortness of breath.   Gastrointestinal: Negative for nausea and vomiting.  Skin: Negative for rash.      Allergies  Review of patient's allergies indicates no known allergies.  Home Medications   Prior to Admission medications   Medication Sig Start Date End Date Taking? Authorizing Provider  albuterol (PROVENTIL  HFA;VENTOLIN HFA) 108 (90 BASE) MCG/ACT inhaler Inhale 2 puffs into the lungs every 6 (six) hours as needed for wheezing. 09/07/12   Peyton Najjaravid H Hopper, MD  ARIPiprazole (ABILIFY) 10 MG tablet Take 10 mg by mouth daily.    Historical Provider, MD  benzonatate (TESSALON) 100 MG capsule Use 1-2 tablets 3 times daily as necessary for cough. May be used with other cough medicines if needed. 09/07/12   Peyton Najjaravid H Hopper, MD  budesonide (RHINOCORT AQUA) 32 MCG/ACT nasal spray Place 2 sprays into the nose daily. 12/21/11 12/20/12  Peyton Najjaravid H Hopper, MD  buPROPion (WELLBUTRIN) 100 MG tablet Take 100 mg by mouth 2 (two) times daily.    Historical Provider, MD  chlorthalidone (HYGROTON) 25 MG tablet Take 25 mg by mouth daily.    Historical Provider, MD  cyclobenzaprine (FLEXERIL) 10 MG tablet Take 1 tablet (10 mg total) by mouth at bedtime as needed for muscle spasms. 08/10/12   Thao P Le, DO  cyclobenzaprine (FLEXERIL) 5 MG tablet Take 1 tablet (5 mg total) by mouth 3 (three) times daily as needed for muscle spasms. 12/27/13   Reuben Likesavid C Keller, MD  diclofenac (VOLTAREN) 75 MG EC tablet Take 1 tablet (75 mg total) by mouth 2 (two) times daily. 12/27/13   Reuben Likesavid C Keller, MD  fluticasone (FLONASE) 50 MCG/ACT nasal spray Place 2 sprays into the nose daily. 08/10/12   Thao P  Le, DO  HYDROcodone-acetaminophen (NORCO/VICODIN) 5-325 MG per tablet 1 to 2 tabs every 4 to 6 hours as needed for pain. 12/27/13   Reuben Likesavid C Keller, MD  levofloxacin (LEVAQUIN) 500 MG tablet Take 1 tablet (500 mg total) by mouth daily. 09/07/12   Peyton Najjaravid H Hopper, MD  meloxicam (MOBIC) 7.5 MG tablet Take 1 tablet (7.5 mg total) by mouth daily. Take with food. Do not take with other NSAIDs ie advil, Aleve, Ibuprofen 08/10/12   Thao P Le, DO  sildenafil (VIAGRA) 100 MG tablet Take 0.5-1 tablets (50-100 mg total) by mouth daily as needed for erectile dysfunction. 08/10/12   Thao P Le, DO   BP 165/91  Pulse 89  Temp(Src) 99.7 F (37.6 C) (Oral)  Resp 16  Ht 6'  (1.829 m)  Wt 235 lb (106.595 kg)  BMI 31.86 kg/m2  SpO2 99% Physical Exam  Nursing note and vitals reviewed. Constitutional: He appears well-developed and well-nourished.  HENT:  Right Ear: Tympanic membrane and ear canal normal.  Left Ear: Tympanic membrane and ear canal normal.  Nose: Nose normal.  Mouth/Throat: Uvula is midline. No trismus in the jaw. No uvula swelling. Posterior oropharyngeal edema and posterior oropharyngeal erythema present.  No trismus Normal voice phonation No drooling   Neck: Normal range of motion. Neck supple.  Cardiovascular: Normal rate, regular rhythm and normal heart sounds.   Pulmonary/Chest: Effort normal and breath sounds normal.  Lymphadenopathy:    He has cervical adenopathy.  Neurological: He is alert.  Skin: Skin is warm and dry. No rash noted.  Psychiatric: He has a normal mood and affect.    ED Course  Procedures (including critical care time) Labs Review Labs Reviewed  RAPID STREP SCREEN - Abnormal; Notable for the following:    Streptococcus, Group A Screen (Direct) POSITIVE (*)    All other components within normal limits    Imaging Review No results found.   EKG Interpretation None      MDM   Final diagnoses:  None   Patient presenting with sore throat x 3 days.  Rapid strep positive.  No signs of Peritonsillar Abscess or Retropharyngeal Abscess at this time.  Patient given PCN IM in the ED.  Patient stable for discharge.  Return precautions given.    Santiago GladHeather Nazaret Chea, PA-C 07/25/14 [phone]

## 2014-10-31 ENCOUNTER — Encounter (HOSPITAL_COMMUNITY): Payer: Self-pay | Admitting: *Deleted

## 2014-10-31 ENCOUNTER — Emergency Department (HOSPITAL_COMMUNITY)
Admission: EM | Admit: 2014-10-31 | Discharge: 2014-10-31 | Disposition: A | Discharge status: Home / Self Care | Payer: Commercial Managed Care - HMO | Attending: Emergency Medicine | Admitting: Emergency Medicine

## 2014-10-31 ENCOUNTER — Encounter: Payer: Medicare Other | Admitting: Family Medicine

## 2014-10-31 ENCOUNTER — Emergency Department (HOSPITAL_COMMUNITY): Payer: Commercial Managed Care - HMO

## 2014-10-31 DIAGNOSIS — M542 Cervicalgia: Secondary | ICD-10-CM | POA: Insufficient documentation

## 2014-10-31 DIAGNOSIS — Z77120 Contact with and (suspected) exposure to mold (toxic): Secondary | ICD-10-CM | POA: Insufficient documentation

## 2014-10-31 DIAGNOSIS — I10 Essential (primary) hypertension: Secondary | ICD-10-CM | POA: Diagnosis not present

## 2014-10-31 DIAGNOSIS — Z7951 Long term (current) use of inhaled steroids: Secondary | ICD-10-CM | POA: Insufficient documentation

## 2014-10-31 DIAGNOSIS — Z8639 Personal history of other endocrine, nutritional and metabolic disease: Secondary | ICD-10-CM | POA: Diagnosis not present

## 2014-10-31 DIAGNOSIS — Z792 Long term (current) use of antibiotics: Secondary | ICD-10-CM | POA: Diagnosis not present

## 2014-10-31 DIAGNOSIS — Z791 Long term (current) use of non-steroidal anti-inflammatories (NSAID): Secondary | ICD-10-CM | POA: Diagnosis not present

## 2014-10-31 DIAGNOSIS — Z79899 Other long term (current) drug therapy: Secondary | ICD-10-CM | POA: Diagnosis not present

## 2014-10-31 DIAGNOSIS — M25511 Pain in right shoulder: Secondary | ICD-10-CM | POA: Diagnosis not present

## 2014-10-31 DIAGNOSIS — M25519 Pain in unspecified shoulder: Secondary | ICD-10-CM

## 2014-10-31 DIAGNOSIS — M47812 Spondylosis without myelopathy or radiculopathy, cervical region: Secondary | ICD-10-CM | POA: Diagnosis not present

## 2014-10-31 DIAGNOSIS — F329 Major depressive disorder, single episode, unspecified: Secondary | ICD-10-CM | POA: Insufficient documentation

## 2014-10-31 MED ORDER — CYCLOBENZAPRINE HCL 10 MG PO TABS: 10.0000 mg | ORAL_TABLET | Freq: Two times a day (BID) | ORAL | Status: DC | PRN

## 2014-10-31 MED ORDER — PREDNISONE 20 MG PO TABS: ORAL_TABLET | ORAL | Status: DC

## 2014-10-31 MED ORDER — PREDNISONE 10 MG PO TABS: 20.0000 mg | ORAL_TABLET | Freq: Every day | ORAL | Status: DC

## 2014-10-31 MED ORDER — HYDROCODONE-ACETAMINOPHEN 5-325 MG PO TABS
2.0000 | ORAL_TABLET | Freq: Once | ORAL | Status: AC
  Administered 2014-10-31: 2 via ORAL
  Filled 2014-10-31: qty 2

## 2014-10-31 NOTE — ED Notes (Signed)
Pt given a gingerale

## 2014-10-31 NOTE — ED Provider Notes (Signed)
CSN: [id number]     Arrival date & time 10/31/14  1731 History  This chart was scribed for Louann Sjogren, PA-C with Elwin Mocha, MD by Tonye Royalty, ED Scribe. This patient was seen in room TR10C/TR10C and the patient's care was started at 7:43 PM.    Chief Complaint  Patient presents with  . Shoulder Pain   HPI  HPI Comments: Adrian Cox is a 54 y.o. male who presents to the Emergency Department complaining of right-sided neck and shoulder pain with onset 1 week ago. He states that since he had spinal fusion surgery to his cervical spine with titanium plate installed in 2003, he has had this problem every year, triggered by cold weather. He denies new injury. He states he has used Tylenol Extra Strength Arthritis and topical cream, but has not had improvement. He states he has pain even when still, and it is worse with movement. He reports associated swelling to his shoulder as well as tingling from his neck and shoulder blade to his hand. He denies numbness or weakness.   Past Medical History  Diagnosis Date  . Hypertension   . Hyperlipidemia   . Depression     bipolar  . Mold exposure    Past Surgical History  Procedure Laterality Date  . Spine surgery    . Knees    . Cervical spine surgery  2003    fusion  . Back surgery     Family History  Problem Relation Age of Onset  . Cancer Mother    History  Substance Use Topics  . Smoking status: Never Smoker   . Smokeless tobacco: Not on file  . Alcohol Use: No    Review of Systems  Constitutional: Negative for fever, chills and unexpected weight change.  Musculoskeletal:       Pain to right shoulder and neck  Neurological: Negative for weakness and numbness.       Positive tingling      Allergies  Review of patient's allergies indicates no known allergies.  Home Medications   Prior to Admission medications   Medication Sig Start Date End Date Taking? Authorizing Provider  albuterol (PROVENTIL HFA;VENTOLIN  HFA) 108 (90 BASE) MCG/ACT inhaler Inhale 2 puffs into the lungs every 6 (six) hours as needed for wheezing. 09/07/12   Peyton Najjar, MD  ARIPiprazole (ABILIFY) 10 MG tablet Take 10 mg by mouth daily.    Historical Provider, MD  benzonatate (TESSALON) 100 MG capsule Use 1-2 tablets 3 times daily as necessary for cough. May be used with other cough medicines if needed. 09/07/12   Peyton Najjar, MD  budesonide (RHINOCORT AQUA) 32 MCG/ACT nasal spray Place 2 sprays into the nose daily. 12/21/11 12/20/12  Peyton Najjar, MD  buPROPion (WELLBUTRIN) 100 MG tablet Take 100 mg by mouth 2 (two) times daily.    Historical Provider, MD  chlorthalidone (HYGROTON) 25 MG tablet Take 25 mg by mouth daily.    Historical Provider, MD  cyclobenzaprine (FLEXERIL) 10 MG tablet Take 1 tablet (10 mg total) by mouth 2 (two) times daily as needed for muscle spasms. 10/31/14   Louann Sjogren, PA-C  diclofenac (VOLTAREN) 75 MG EC tablet Take 1 tablet (75 mg total) by mouth 2 (two) times daily. 12/27/13   Reuben Likes, MD  fluticasone (FLONASE) 50 MCG/ACT nasal spray Place 2 sprays into the nose daily. 08/10/12   Thao P Le, DO  HYDROcodone-acetaminophen (NORCO/VICODIN) 5-325 MG per tablet 1 to  2 tabs every 4 to 6 hours as needed for pain. 12/27/13   Reuben Likesavid C Keller, MD  levofloxacin (LEVAQUIN) 500 MG tablet Take 1 tablet (500 mg total) by mouth daily. 09/07/12   Peyton Najjaravid H Hopper, MD  meloxicam (MOBIC) 7.5 MG tablet Take 1 tablet (7.5 mg total) by mouth daily. Take with food. Do not take with other NSAIDs ie advil, Aleve, Ibuprofen 08/10/12   Thao P Le, DO  predniSONE (DELTASONE) 20 MG tablet 2 tabs po daily x 5 days 10/31/14   Louann SjogrenVictoria L Daquana Paddock, PA-C  sildenafil (VIAGRA) 100 MG tablet Take 0.5-1 tablets (50-100 mg total) by mouth daily as needed for erectile dysfunction. 08/10/12   Thao P Le, DO   BP 155/77 mmHg  Pulse 74  Temp(Src) 97.1 F (36.2 C) (Oral)  Resp 18  SpO2 98% Physical Exam  Constitutional: He appears  well-developed and well-nourished. No distress.  HENT:  Head: Normocephalic and atraumatic.  Eyes: Conjunctivae are normal. Right eye exhibits no discharge. Left eye exhibits no discharge.  Neck: Normal range of motion.  Pulmonary/Chest: Effort normal. No respiratory distress.  Musculoskeletal:  5/5 strength equal bilaterally to upper extremities No right clavicular stepoffs, crepitus, or tenderness No deformity to right shoulder No redness, erythema, swelling. Full range of motion of right shoulder. Right-sided neck tenderness worse with range of motion. No midline tenderness.  Neurological: He is alert. Coordination normal.  Skin: He is not diaphoretic.  Psychiatric: He has a normal mood and affect. His behavior is normal.  Nursing note and vitals reviewed.   ED Course  Procedures (including critical care time)  DIAGNOSTIC STUDIES: Oxygen Saturation is 97% on room air, normal by my interpretation.    COORDINATION OF CARE: 7:51 PM Discussed treatment plan with patient at beside, including pain medication, x-ray, rest, ice, compression, Ibuprofen, and follow up to his neurosurgeon or orthopedist. The patient agrees with the plan and has no further questions at this time.   Labs Review Labs Reviewed - No data to display  Imaging Review Dg Cervical Spine Complete  10/31/2014   CLINICAL DATA:  Initial encounter for right neck and shoulder pain. Cervical fusion 13 years ago.  EXAM: CERVICAL SPINE  4+ VIEWS  COMPARISON:  01/23/2011  FINDINGS: The lateral view images through the bottom of C7. Prevertebral soft tissues are within normal limits. Anterior fixation at C6-7, without acute hardware complication. Multilevel spondylosis, with loss of intervertebral disc height and endplate osteophytes anteriorly at C3-4, C4-5, and C7-T1. Mild straightening and reversal expected cervical lordosis is likely postoperative. Bilateral neural foraminal narrowing at multiple levels, primarily secondary  to uncovertebral joint hypertrophy. Example C3-4 and C4-5 bilaterally. Lateral masses and odontoid process within normal limits.  IMPRESSION: Postsurgical and degenerative changes, without acute superimposed process.   Electronically Signed   By: Jeronimo GreavesKyle  Talbot M.D.   On: 10/31/2014 20:29   Dg Shoulder Right  10/31/2014   CLINICAL DATA:  Initial encounter for right shoulder pain. No recent trauma.  EXAM: RIGHT SHOULDER - 2+ VIEW  COMPARISON:  None.  FINDINGS: No acute fracture or dislocation. Joint spaces maintained. Visualized portion of the right hemithorax is normal.  IMPRESSION: No acute osseous abnormality.   Electronically Signed   By: Jeronimo GreavesKyle  Talbot M.D.   On: 10/31/2014 20:30     EKG Interpretation None      Meds given in ED:  Medications  HYDROcodone-acetaminophen (NORCO/VICODIN) 5-325 MG per tablet 2 tablet (2 tablets Oral Given 10/31/14 2002)    Discharge Medication  List as of 10/31/2014  8:49 PM    START taking these medications   Details  predniSONE (DELTASONE) 20 MG tablet 2 tabs po daily x 5 days, Print          MDM   Final diagnoses:  Neck pain  Shoulder pain  Right shoulder pain  Neck pain on right side   Patient X-Ray negative for obvious fracture or dislocation. Note hardware complication. Pain managed in ED. Pt without erythema, edema or warmth to joint. I doubt septic arthritis. Pt advised to follow up with PCP/orthopedics if symptoms persist. Patient given brace while in ED, RICE, ibuprofen and conservative therapy recommended and discussed. Patient to follow-up with his neurosurgeon for worsening or persistent pain or symptoms. Patient given prescription for Flexeril. Driving and sedation precautions provided.  Discussed return precautions with patient. Discussed all results and patient verbalizes understanding and agrees with plan.  I personally performed the services described in this documentation, which was scribed in my presence. The recorded information  has been reviewed and is accurate.   Louann Sjogren, PA-C 11/01/14 0127  Elwin Mocha, MD 11/01/14 [phone]

## 2014-10-31 NOTE — ED Notes (Addendum)
Pt reports every year having the same pain to right side neck/shoulder pain, triggered by cold weather. No relief with otc meds and heat.

## 2014-10-31 NOTE — Discharge Instructions (Signed)
Return to the emergency room with worsening of symptoms, new symptoms or with symptoms that are concerning, especially fevers, redness, swelling, warmth, numbness, tingling, weakness. RICE: Rest, Ice (three cycles of 20 mins on, 20mins off at least twice a day), compression/brace, elevation. Heating pad works well for back pain. Ibuprofen 400mg  (2 tablets 200mg ) every 5-6 hours for 3-5 days Call to make a follow-up appointment with your neurosurgeon for persistent or worsening symptoms. Reviewed below information and follow recommendations.  Radicular Pain Radicular pain in either the arm or leg is usually from a bulging or herniated disk in the spine. A piece of the herniated disk may press against the nerves as the nerves exit the spine. This causes pain which is felt at the tips of the nerves down the arm or leg. Other causes of radicular pain may include:  Fractures.  Heart disease.  Cancer.  An abnormal and usually degenerative state of the nervous system or nerves (neuropathy). Diagnosis may require CT or MRI scanning to determine the primary cause.  Nerves that start at the neck (nerve roots) may cause radicular pain in the outer shoulder and arm. It can spread down to the thumb and fingers. The symptoms vary depending on which nerve root has been affected. In most cases radicular pain improves with conservative treatment. Neck problems may require physical therapy, a neck collar, or cervical traction. Treatment may take many weeks, and surgery may be considered if the symptoms do not improve.  Conservative treatment is also recommended for sciatica. Sciatica causes pain to radiate from the lower back or buttock area down the leg into the foot. Often there is a history of back problems. Most patients with sciatica are better after 2 to 4 weeks of rest and other supportive care. Short term bed rest can reduce the disk pressure considerably. Sitting, however, is not a good position since this  increases the pressure on the disk. You should avoid bending, lifting, and all other activities which make the problem worse. Traction can be used in severe cases. Surgery is usually reserved for patients who do not improve within the first months of treatment. Only take over-the-counter or prescription medicines for pain, discomfort, or fever as directed by your caregiver. Narcotics and muscle relaxants may help by relieving more severe pain and spasm and by providing mild sedation. Cold or massage can give significant relief. Spinal manipulation is not recommended. It can increase the degree of disc protrusion. Epidural steroid injections are often effective treatment for radicular pain. These injections deliver medicine to the spinal nerve in the space between the protective covering of the spinal cord and back bones (vertebrae). Your caregiver can give you more information about steroid injections. These injections are most effective when given within two weeks of the onset of pain.  You should see your caregiver for follow up care as recommended. A program for neck and back injury rehabilitation with stretching and strengthening exercises is an important part of management.  SEEK IMMEDIATE MEDICAL CARE IF:  You develop increased pain, weakness, or numbness in your arm or leg.  You develop difficulty with bladder or bowel control.  You develop abdominal pain. Document Released: 11/10/2004 Document Revised: 12/26/2011 Document Reviewed: 01/26/2009 [facility] Patient Information 2015 [place], [place]. This information is not intended to replace advice given to you by your health care provider. Make sure you discuss any questions you have with your health care provider.  Shoulder Pain The shoulder is the joint that connects your arms to  your body. The bones that form the shoulder joint include the upper arm bone (humerus), the shoulder blade (scapula), and the collarbone (clavicle). The top of the  humerus is shaped like a ball and fits into a rather flat socket on the scapula (glenoid cavity). A combination of muscles and strong, fibrous tissues that connect muscles to bones (tendons) support your shoulder joint and hold the ball in the socket. Small, fluid-filled sacs (bursae) are located in different areas of the joint. They act as cushions between the bones and the overlying soft tissues and help reduce friction between the gliding tendons and the bone as you move your arm. Your shoulder joint allows a wide range of motion in your arm. This range of motion allows you to do things like scratch your back or throw a ball. However, this range of motion also makes your shoulder more prone to pain from overuse and injury. Causes of shoulder pain can originate from both injury and overuse and usually can be grouped in the following four categories:  Redness, swelling, and pain (inflammation) of the tendon (tendinitis) or the bursae (bursitis).  Instability, such as a dislocation of the joint.  Inflammation of the joint (arthritis).  Broken bone (fracture). HOME CARE INSTRUCTIONS   Apply ice to the sore area.  Put ice in a plastic bag.  Place a towel between your skin and the bag.  Leave the ice on for 15-20 minutes, 3-4 times per day for the first 2 days, or as directed by your health care provider.  Stop using cold packs if they do not help with the pain.  If you have a shoulder sling or immobilizer, wear it as long as your caregiver instructs. Only remove it to shower or bathe. Move your arm as little as possible, but keep your hand moving to prevent swelling.  Squeeze a soft ball or foam pad as much as possible to help prevent swelling.  Only take over-the-counter or prescription medicines for pain, discomfort, or fever as directed by your caregiver. SEEK MEDICAL CARE IF:   Your shoulder pain increases, or new pain develops in your arm, hand, or fingers.  Your hand or fingers  become cold and numb.  Your pain is not relieved with medicines. SEEK IMMEDIATE MEDICAL CARE IF:   Your arm, hand, or fingers are numb or tingling.  Your arm, hand, or fingers are significantly swollen or turn white or blue. MAKE SURE YOU:   Understand these instructions.  Will watch your condition.  Will get help right away if you are not doing well or get worse. Document Released: 07/13/2005 Document Revised: 02/17/2014 Document Reviewed: 09/17/2011 [facility] Patient Information 2015 [place] , [place]. This information is not intended to replace advice given to you by your health care provider. Make sure you discuss any questions you have with your health care provider.

## 2015-02-02 ENCOUNTER — Ambulatory Visit (INDEPENDENT_AMBULATORY_CARE_PROVIDER_SITE_OTHER): Payer: Commercial Managed Care - HMO | Admitting: Family Medicine

## 2015-02-02 VITALS — BP 156/88 | HR 91 | Temp 97.6°F | Resp 18 | Ht 72.5 in | Wt 237.0 lb

## 2015-02-02 DIAGNOSIS — R102 Pelvic and perineal pain: Secondary | ICD-10-CM | POA: Diagnosis not present

## 2015-02-02 DIAGNOSIS — L02215 Cutaneous abscess of perineum: Secondary | ICD-10-CM | POA: Diagnosis not present

## 2015-02-02 MED ORDER — DOXYCYCLINE HYCLATE 100 MG PO CAPS: 100.0000 mg | ORAL_CAPSULE | Freq: Two times a day (BID) | ORAL | Status: DC

## 2015-02-02 NOTE — Patient Instructions (Signed)
We drained an abscess of your perineum today.   Take a warm tub soak (10- 15 minute) three times a day for the next few days.  You can use ibuprofen and/ or tylenol as needed for pain Take the doxycyline antibiotic as directed Let me know if the area does not continue to get better

## 2015-02-02 NOTE — Progress Notes (Signed)
[facility] [address], [place] [id number] [phone]  Date:  02/02/2015   Name:  Adrian Cox   DOB:  [birth year]   MRN:  [id number]  PCP:  Janace HoardHOPPER,DAVID, MD    Chief Complaint: Recurrent Skin Infections   History of Present Illness:  Adrian Patternthony G Setzler is a 54 y.o. very pleasant male patient who presents with the following:  He has noted a lesion near his anus recently that seemed to start after straining due to some constipation.  He tried to deal with it over the last week at home. It had been really painful, but it is now feeling better.  He still has some pain with BM.  He notes that the area seems to be draining some pus.  He also has an area more on the perineum that is now also painful and swollen.  He has not had this problem in the past. He has not noted a fever.   NKDA No nausea or vomiting  There are no active problems to display for this patient.   Past Medical History  Diagnosis Date  . Hypertension   . Hyperlipidemia   . Depression     bipolar  . Mold exposure     Past Surgical History  Procedure Laterality Date  . Spine surgery    . Knees    . Cervical spine surgery  2003    fusion  . Back surgery      History  Substance Use Topics  . Smoking status: Never Smoker   . Smokeless tobacco: Not on file  . Alcohol Use: No    Family History  Problem Relation Age of Onset  . Cancer Mother     No Known Allergies  Medication list has been reviewed and updated.  Current Outpatient Prescriptions on File Prior to Visit  Medication Sig Dispense Refill  . albuterol (PROVENTIL HFA;VENTOLIN HFA) 108 (90 BASE) MCG/ACT inhaler Inhale 2 puffs into the lungs every 6 (six) hours as needed for wheezing. (Patient not taking: Reported on 02/02/2015) 1 Inhaler 0  . ARIPiprazole (ABILIFY) 10 MG tablet Take 10 mg by mouth daily.    . benzonatate (TESSALON) 100 MG capsule Use 1-2 tablets 3 times daily as necessary for cough. May be used with  other cough medicines if needed. (Patient not taking: Reported on 02/02/2015) 30 capsule 0  . budesonide (RHINOCORT AQUA) 32 MCG/ACT nasal spray Place 2 sprays into the nose daily. (Patient not taking: Reported on 02/02/2015) 1 Bottle 5  . buPROPion (WELLBUTRIN) 100 MG tablet Take 100 mg by mouth 2 (two) times daily.    . chlorthalidone (HYGROTON) 25 MG tablet Take 25 mg by mouth daily.    . cyclobenzaprine (FLEXERIL) 10 MG tablet Take 1 tablet (10 mg total) by mouth 2 (two) times daily as needed for muscle spasms. (Patient not taking: Reported on 02/02/2015) 20 tablet 0  . diclofenac (VOLTAREN) 75 MG EC tablet Take 1 tablet (75 mg total) by mouth 2 (two) times daily. (Patient not taking: Reported on 02/02/2015) 20 tablet 0  . fluticasone (FLONASE) 50 MCG/ACT nasal spray Place 2 sprays into the nose daily. (Patient not taking: Reported on 02/02/2015) 16 g 6  . HYDROcodone-acetaminophen (NORCO/VICODIN) 5-325 MG per tablet 1 to 2 tabs every 4 to 6 hours as needed for pain. (Patient not taking: Reported on 02/02/2015) 20 tablet 0  . levofloxacin (LEVAQUIN) 500 MG tablet Take 1 tablet (500 mg total) by mouth daily. (Patient  not taking: Reported on 02/02/2015) 7 tablet 0  . meloxicam (MOBIC) 7.5 MG tablet Take 1 tablet (7.5 mg total) by mouth daily. Take with food. Do not take with other NSAIDs ie advil, Aleve, Ibuprofen (Patient not taking: Reported on 02/02/2015) 30 tablet 1  . predniSONE (DELTASONE) 20 MG tablet 2 tabs po daily x 5 days (Patient not taking: Reported on 02/02/2015) 10 tablet 0  . sildenafil (VIAGRA) 100 MG tablet Take 0.5-1 tablets (50-100 mg total) by mouth daily as needed for erectile dysfunction. (Patient not taking: Reported on 02/02/2015) 5 tablet 5   No current facility-administered medications on file prior to visit.    Review of Systems:  As per HPI- otherwise negative.   Physical Examination: Filed Vitals:   02/02/15 1353  BP: 156/88  Pulse: 91  Temp: 97.6 F (36.4 C)  Resp:  18   Filed Vitals:   02/02/15 1353  Height: 6' 0.5" (1.842 m)  Weight: 237 lb (107.502 kg)   Body mass index is 31.68 kg/(m^2). Ideal Body Weight: Weight in (lb) to have BMI = 25: 186.5  GEN: WDWN, NAD, Non-toxic, A & O x 3, overweight, looks well HEENT: Atraumatic, Normocephalic. Neck supple. No masses, No LAD. Ears and Nose: No external deformity. CV: RRR, No M/G/R. No JVD. No thrill. No extra heart sounds. PULM: CTA B, no wheezes, crackles, rhonchi. No retractions. No resp. distress. No accessory muscle use. ABD: S, NT, ND. No rebound. No HSM. EXTR: No c/c/e NEURO Normal gait.  PSYCH: Normally interactive. Conversant. Not depressed or anxious appearing.  Calm demeanor.  He has an already draining small abscess at 2:00 to the anus.  It is minimally tender and is not fluctuant. Rectal exam does not reveal any fistula.  He also has a small active abscess on the perineum just posterior to the scrotum.  It is superficial, fluctuant and tender.   Procedure note:  VC obtained.  Prepped area on perineum with betadine and injected 1ml of 1% lidocaine plain.  Made small stab with 11 blade and immediately expressed a large amount of pus.  Pt tolerated well and felt relief  Assessment and Plan: Perineal abscess - Plan: doxycycline (VIBRAMYCIN) 100 MG capsule  I and D of perineal abscess as above.  Perianal abscess is already draining.  Treat with doxycycline and warm soaks for the next several days.  He will seek care if not continuing to improve   Signed Abbe Amsterdam, MD

## 2015-02-03 DIAGNOSIS — H5213 Myopia, bilateral: Secondary | ICD-10-CM | POA: Diagnosis not present

## 2015-02-03 DIAGNOSIS — Z0100 Encounter for examination of eyes and vision without abnormal findings: Secondary | ICD-10-CM | POA: Diagnosis not present

## 2015-02-03 DIAGNOSIS — H5210 Myopia, unspecified eye: Secondary | ICD-10-CM | POA: Diagnosis not present

## 2015-04-18 ENCOUNTER — Encounter (HOSPITAL_COMMUNITY): Payer: Self-pay | Admitting: *Deleted

## 2015-04-18 ENCOUNTER — Emergency Department (HOSPITAL_COMMUNITY): Payer: Commercial Managed Care - HMO

## 2015-04-18 ENCOUNTER — Emergency Department (HOSPITAL_COMMUNITY)
Admission: EM | Admit: 2015-04-18 | Discharge: 2015-04-18 | Disposition: A | Discharge status: Home / Self Care | Payer: Commercial Managed Care - HMO | Attending: Emergency Medicine | Admitting: Emergency Medicine

## 2015-04-18 DIAGNOSIS — I10 Essential (primary) hypertension: Secondary | ICD-10-CM | POA: Insufficient documentation

## 2015-04-18 DIAGNOSIS — Z8639 Personal history of other endocrine, nutritional and metabolic disease: Secondary | ICD-10-CM | POA: Insufficient documentation

## 2015-04-18 DIAGNOSIS — F319 Bipolar disorder, unspecified: Secondary | ICD-10-CM | POA: Insufficient documentation

## 2015-04-18 DIAGNOSIS — J069 Acute upper respiratory infection, unspecified: Secondary | ICD-10-CM | POA: Diagnosis not present

## 2015-04-18 DIAGNOSIS — Z792 Long term (current) use of antibiotics: Secondary | ICD-10-CM | POA: Diagnosis not present

## 2015-04-18 DIAGNOSIS — R509 Fever, unspecified: Secondary | ICD-10-CM | POA: Diagnosis not present

## 2015-04-18 DIAGNOSIS — R05 Cough: Secondary | ICD-10-CM | POA: Diagnosis not present

## 2015-04-18 DIAGNOSIS — M791 Myalgia: Secondary | ICD-10-CM | POA: Diagnosis present

## 2015-04-18 DIAGNOSIS — Z79899 Other long term (current) drug therapy: Secondary | ICD-10-CM | POA: Diagnosis not present

## 2015-04-18 LAB — RAPID STREP SCREEN (MED CTR MEBANE ONLY): Streptococcus, Group A Screen (Direct): NEGATIVE

## 2015-04-18 MED ORDER — CHLORHEXIDINE GLUCONATE 0.12 % MT SOLN: 15.0000 mL | Freq: Two times a day (BID) | OROMUCOSAL | Status: DC

## 2015-04-18 MED ORDER — ACETAMINOPHEN 325 MG PO TABS
650.0000 mg | ORAL_TABLET | Freq: Once | ORAL | Status: AC
  Administered 2015-04-18: 650 mg via ORAL
  Filled 2015-04-18: qty 2

## 2015-04-18 NOTE — ED Provider Notes (Signed)
CSN: [id number]     Arrival date & time 04/18/15  1949 History   First MD Initiated Contact with Patient 04/18/15 2142     Chief Complaint  Patient presents with  . Generalized Body Aches     (Consider location/radiation/quality/duration/timing/severity/associated sxs/prior Treatment) HPI   54 year old male with hx of HTN, [name], bipolar who presents for evaluation of URI symptoms. Patient states the past 2 days he has had fever as high as 101.0, now frontal headache, sore throat and neck discomfort. He endorsed occasional dry cough. Also complaining of generalized body aches. He denies any runny nose, sneezing, ear pain, neck stiffness, chest pain, shortness of breath, abdominal pain, nausea vomiting diarrhea, dysuria, or rash. He did take some Excedrin headache that has helped his symptoms. He has been playing golf outside for the past 2 days and decided to come here to ER after playing golf. He has had strep infection the past. He denies any recent travel and no recent sick contacts.  Past Medical History  Diagnosis Date  . Hypertension   . Hyperlipidemia   . Depression     bipolar  . Mold exposure    Past Surgical History  Procedure Laterality Date  . Spine surgery    . Knees    . Cervical spine surgery  2003    fusion  . Back surgery     Family History  Problem Relation Age of Onset  . Cancer Mother    History  Substance Use Topics  . Smoking status: Never Smoker   . Smokeless tobacco: Not on file  . Alcohol Use: No    Review of Systems  All other systems reviewed and are negative.     Allergies  Review of patient's allergies indicates no known allergies.  Home Medications   Prior to Admission medications   Medication Sig Start Date End Date Taking? Authorizing Provider  amLODipine (NORVASC) 5 MG tablet Take 5 mg by mouth daily.    Historical Provider, MD  ARIPiprazole (ABILIFY) 10 MG tablet Take 10 mg by mouth daily.    Historical Provider, MD  buPROPion  (WELLBUTRIN) 100 MG tablet Take 100 mg by mouth 2 (two) times daily.    Historical Provider, MD  chlorthalidone (HYGROTON) 25 MG tablet Take 25 mg by mouth daily.    Historical Provider, MD  doxycycline (VIBRAMYCIN) 100 MG capsule Take 1 capsule (100 mg total) by mouth 2 (two) times daily. 02/02/15   Gwenlyn Found Copland, MD  levofloxacin (LEVAQUIN) 500 MG tablet Take 1 tablet (500 mg total) by mouth daily. Patient not taking: Reported on 02/02/2015 09/07/12   Peyton Najjar, MD  lisinopril (PRINIVIL,ZESTRIL) 5 MG tablet Take 5 mg by mouth daily.    Historical Provider, MD  testosterone cypionate (DEPOTESTOTERONE CYPIONATE) 100 MG/ML injection Inject 200 mg into the muscle every 14 (fourteen) days. For IM use only    Historical Provider, MD   BP 144/68 mmHg  Pulse 104  Temp(Src) 100.1 F (37.8 C)  Resp 18  Ht 6' (1.829 m)  Wt 234 lb 5 oz (106.283 kg)  BMI 31.77 kg/m2  SpO2 95% Physical Exam  Constitutional: He is oriented to person, place, and time. He appears well-developed and well-nourished. No distress.  African-American male, appears to be in no acute distress, pleasant and conversant  HENT:  Head: Atraumatic.  Right Ear: External ear normal.  Left Ear: External ear normal.  Nose: Nose normal.  Mouth/Throat: Oropharynx is clear and moist. No oropharyngeal exudate.  Eyes: Conjunctivae are normal. Pupils are equal, round, and reactive to light.  Neck: Normal range of motion. Neck supple.  No nuchal rigidity  Cardiovascular: Normal rate, regular rhythm and intact distal pulses.   Pulmonary/Chest: Effort normal and breath sounds normal. No respiratory distress. He has no wheezes. He has no rales. He exhibits no tenderness.  Abdominal: Soft. There is no tenderness.  Lymphadenopathy:    He has no cervical adenopathy.  Neurological: He is alert and oriented to person, place, and time.  Skin: No rash noted.  Psychiatric: He has a normal mood and affect.  Nursing note and vitals  reviewed.   ED Course  Procedures (including critical care time)  Patient here with sore throat, fever, myalgias, suggestive of viral infection. Strep test is negative, chest x-ray shows no evidence of focal infiltrate concerning for pneumonia. Vital signs stable. Abdomen is nontender, no evidence of hepatosplenomegaly. Recommend rest, symptomatic treatment, return if symptoms worsen. Patient can follow-up with PCP as needed.  Labs Review Labs Reviewed  RAPID STRE[facility] SCREEN ([facility])  CULTURE, GROUP A STREP    Imaging Review Dg Chest 2 View  04/18/2015   CLINICAL DATA:  Fever and cough. Sore throat. Headache. Hypertension.  EXAM: CHEST  2 VIEW  COMPARISON:  09/07/2012  FINDINGS: Lateral view degraded by patient arm position. Lower cervical spine fixation. Midline trachea. Borderline cardiomegaly. Mediastinal contours otherwise within normal limits. No pleural effusion or pneumothorax. Clear lungs.  IMPRESSION: Borderline cardiomegaly, without acute disease or explanation for fever.   Electronically Signed   By: Jeronimo GreavesKyle  Talbot M.D.   On: 04/18/2015 20:43     EKG Interpretation None      MDM   Final diagnoses:  URI (upper respiratory infection)    BP 136/70 mmHg  Pulse 88  Temp(Src) 98.6 F (37 C) (Oral)  Resp 15  Ht 6' (1.829 m)  Wt 234 lb 5 oz (106.283 kg)  BMI 31.77 kg/m2  SpO2 96%  I have reviewed nursing notes and vital signs. I personally viewed the imaging tests through PACS system and agrees with radiologist's intepretation I reviewed available ER/hospitalization records through the EMR     Fayrene HelperBowie Masao Junker, PA-C 04/18/15 2201  Rolland PorterMark James, MD 04/23/15 1520

## 2015-04-18 NOTE — ED Notes (Signed)
The pt played golf in the hot sun all day  Feeling bad

## 2015-04-18 NOTE — ED Notes (Signed)
Pa Bowie at bedside. 

## 2015-04-18 NOTE — ED Notes (Signed)
The pt has ha d a dry cough body aches headache sorethroat since yesterday with a fever.  No nausea vomiting or diarrhea

## 2015-04-18 NOTE — Discharge Instructions (Signed)
Upper Respiratory Infection, Adult An upper respiratory infection (URI) is also sometimes known as the common cold. The upper respiratory tract includes the nose, sinuses, throat, trachea, and bronchi. Bronchi are the airways leading to the lungs. Most people improve within 1 week, but symptoms can last up to 2 weeks. A residual cough may last even longer.  CAUSES Many different viruses can infect the tissues lining the upper respiratory tract. The tissues become irritated and inflamed and often become very moist. Mucus production is also common. A cold is contagious. You can easily spread the virus to others by oral contact. This includes kissing, sharing a glass, coughing, or sneezing. Touching your mouth or nose and then touching a surface, which is then touched by another person, can also spread the virus. SYMPTOMS  Symptoms typically develop 1 to 3 days after you come in contact with a cold virus. Symptoms vary from person to person. They may include:  Runny nose.  Sneezing.  Nasal congestion.  Sinus irritation.  Sore throat.  Loss of voice (laryngitis).  Cough.  Fatigue.  Muscle aches.  Loss of appetite.  Headache.  Low-grade fever. DIAGNOSIS  You might diagnose your own cold based on familiar symptoms, since most people get a cold 2 to 3 times a year. Your caregiver can confirm this based on your exam. Most importantly, your caregiver can check that your symptoms are not due to another disease such as strep throat, sinusitis, pneumonia, asthma, or epiglottitis. Blood tests, throat tests, and X-rays are not necessary to diagnose a common cold, but they may sometimes be helpful in excluding other more serious diseases. Your caregiver will decide if any further tests are required. RISKS AND COMPLICATIONS  You may be at risk for a more severe case of the common cold if you smoke cigarettes, have chronic heart disease (such as heart failure) or lung disease (such as asthma), or if  you have a weakened immune system. The very young and very old are also at risk for more serious infections. Bacterial sinusitis, middle ear infections, and bacterial pneumonia can complicate the common cold. The common cold can worsen asthma and chronic obstructive pulmonary disease (COPD). Sometimes, these complications can require emergency medical care and may be life-threatening. PREVENTION  The best way to protect against getting a cold is to practice good hygiene. Avoid oral or hand contact with people with cold symptoms. Wash your hands often if contact occurs. There is no clear evidence that vitamin C, vitamin E, echinacea, or exercise reduces the chance of developing a cold. However, it is always recommended to get plenty of rest and practice good nutrition. TREATMENT  Treatment is directed at relieving symptoms. There is no cure. Antibiotics are not effective, because the infection is caused by a virus, not by bacteria. Treatment may include:  Increased fluid intake. Sports drinks offer valuable electrolytes, sugars, and fluids.  Breathing heated mist or steam (vaporizer or shower).  Eating chicken soup or other clear broths, and maintaining good nutrition.  Getting plenty of rest.  Using gargles or lozenges for comfort.  Controlling fevers with ibuprofen or acetaminophen as directed by your caregiver.  Increasing usage of your inhaler if you have asthma. Zinc gel and zinc lozenges, taken in the first 24 hours of the common cold, can shorten the duration and lessen the severity of symptoms. Pain medicines may help with fever, muscle aches, and throat pain. A variety of non-prescription medicines are available to treat congestion and runny nose. Your caregiver   can make recommendations and may suggest nasal or lung inhalers for other symptoms.  HOME CARE INSTRUCTIONS   Only take over-the-counter or prescription medicines for pain, discomfort, or fever as directed by your  caregiver.  Use a warm mist humidifier or inhale steam from a shower to increase air moisture. This may keep secretions moist and make it easier to breathe.  Drink enough water and fluids to keep your urine clear or pale yellow.  Rest as needed.  Return to work when your temperature has returned to normal or as your caregiver advises. You may need to stay home longer to avoid infecting others. You can also use a face mask and careful hand washing to prevent spread of the virus. SEEK MEDICAL CARE IF:   After the first few days, you feel you are getting worse rather than better.  You need your caregiver's advice about medicines to control symptoms.  You develop chills, worsening shortness of breath, or brown or red sputum. These may be signs of pneumonia.  You develop yellow or brown nasal discharge or pain in the face, especially when you bend forward. These may be signs of sinusitis.  You develop a fever, swollen neck glands, pain with swallowing, or white areas in the back of your throat. These may be signs of strep throat. SEEK IMMEDIATE MEDICAL CARE IF:   You have a fever.  You develop severe or persistent headache, ear pain, sinus pain, or chest pain.  You develop wheezing, a prolonged cough, cough up blood, or have a change in your usual mucus (if you have chronic lung disease).  You develop sore muscles or a stiff neck. Document Released: 03/29/2001 Document Revised: 12/26/2011 Document Reviewed: 01/08/2014 [employer] Patient Information 2015 [employer]. This information is not intended to replace advice given to you by your health care provider. Make sure you discuss any questions you have with your health care provider.  

## 2015-04-20 ENCOUNTER — Ambulatory Visit (INDEPENDENT_AMBULATORY_CARE_PROVIDER_SITE_OTHER): Payer: Commercial Managed Care - HMO | Admitting: Internal Medicine

## 2015-04-20 VITALS — BP 120/80 | HR 90 | Temp 99.3°F | Resp 16 | Ht 72.5 in | Wt 233.0 lb

## 2015-04-20 DIAGNOSIS — J36 Peritonsillar abscess: Secondary | ICD-10-CM | POA: Diagnosis not present

## 2015-04-20 LAB — POCT CBC
GRANULOCYTE PERCENT: 88.7 % — AB (ref 37–80)
HEMATOCRIT: 40.4 % — AB (ref 43.5–53.7)
Hemoglobin: 13.4 g/dL — AB (ref 14.1–18.1)
LYMPH, POC: 2.8 (ref 0.6–3.4)
MCH, POC: 31 pg (ref 27–31.2)
MCHC: 33.2 g/dL (ref 31.8–35.4)
MCV: 93.2 fL (ref 80–97)
MID (cbc): 0.4 (ref 0–0.9)
MPV: 9 fL (ref 0–99.8)
PLATELET COUNT, POC: 223 10*3/uL (ref 142–424)
POC Granulocyte: 25.4 — AB (ref 2–6.9)
POC LYMPH PERCENT: 9.9 %L — AB (ref 10–50)
POC MID %: 1.4 % (ref 0–12)
RBC: 4.34 M/uL — AB (ref 4.69–6.13)
RDW, POC: 13.6 %
WBC: 28.6 10*3/uL — AB (ref 4.6–10.2)

## 2015-04-20 LAB — CULTURE, GROUP A STREP: Strep A Culture: POSITIVE — AB

## 2015-04-20 MED ORDER — AMOXICILLIN 500 MG PO CAPS: 1000.0000 mg | ORAL_CAPSULE | Freq: Two times a day (BID) | ORAL | Status: AC

## 2015-04-20 MED ORDER — CEFTRIAXONE SODIUM 1 G IJ SOLR
1.0000 g | Freq: Once | INTRAMUSCULAR | Status: AC
  Administered 2015-04-20: 1 g via INTRAMUSCULAR

## 2015-04-20 MED ORDER — PREDNISONE 20 MG PO TABS: ORAL_TABLET | ORAL | Status: DC

## 2015-04-20 NOTE — Progress Notes (Signed)
Subjective:  This chart was scribed for Ellamae Siaobert [place] Retana, MD by [facility]Nadim Abu Hashem, medical scribe at [facility].The patient was seen in exam room 12 and the patient's care was started at 10:58 AM.   Patient ID: Adrian Cox, male    DOB: 1961/05/17, 54 y.o.   MRN: [id number] Chief Complaint  Patient presents with  . Sore Throat    x 3 days   HPI HPI Comments: Adrian Patternthony G Kowalke is a 54 y.o. male who presents to [facility] complaining of sore throat with associated pain with swallowing on the right side. Pain began three days ago. Seen at the ER yesterday, strep test was negative and chest x-ray was normal. He did have a fever yesterday of 101 and 99 today. He has been taking tylenol for relief. He is having more difficulty swallowing today being yesterday.  No cough or rash or abdominal pain.  Past Medical History  Diagnosis Date  . Hypertension   . Hyperlipidemia   . Depression     bipolar  . Mold exposure    Prior to Admission medications   Medication Sig Start Date End Date Taking? Authorizing Provider  amLODipine (NORVASC) 5 MG tablet Take 5 mg by mouth daily.   Yes Historical Provider, MD  chlorhexidine (PERIDEX) 0.12 % solution Use as directed 15 mLs in the mouth or throat 2 (two) times daily. 04/18/15  Yes Fayrene HelperBowie Tran, PA-C  chlorthalidone (HYGROTON) 25 MG tablet Take 25 mg by mouth daily.   Yes Historical Provider, MD  lisinopril (PRINIVIL,ZESTRIL) 5 MG tablet Take 5 mg by mouth daily.   Yes Historical Provider, MD  testosterone cypionate (DEPOTESTOTERONE CYPIONATE) 100 MG/ML injection Inject 200 mg into the muscle every 14 (fourteen) days. For IM use only   Yes Historical Provider, MD  ARIPiprazole (ABILIFY) 10 MG tablet Take 10 mg by mouth daily.    Historical Provider, MD  buPROPion (WELLBUTRIN) 100 MG tablet Take 100 mg by mouth 2 (two) times daily.    Historical Provider, MD   No Known Allergies  Review of Systems  Constitutional:  Positive for fever.  HENT: Positive for sore throat and trouble swallowing.   Eyes: Negative for visual disturbance.  Respiratory: Negative for cough and wheezing.   Cardiovascular: Negative for chest pain.  Gastrointestinal: Negative for abdominal pain.  Skin: Negative for rash.      Objective:  BP 120/80 mmHg  Pulse 90  Temp(Src) 99.3 F (37.4 C) (Oral)  Resp 16  Ht 6' 0.5" (1.842 m)  Wt 233 lb (105.688 kg)  BMI 31.15 kg/m2  SpO2 97% Physical Exam  Constitutional: He is oriented to person, place, and time. He appears well-developed and well-nourished. No distress.  HENT:  Head: Normocephalic and atraumatic.  Right Ear: External ear normal.  Left Ear: External ear normal.  Nose: Nose normal.  The throat is red with scant exudate. There is swelling and the soft palate anterior to the tonsillar pillar on the right which is tender to palpation and slightly fluctuant. It is not very erythematous and there is no purulent drainage. Right anterior cervical node is only 1+ but is tender. He has a right PC node 1 cm and tender  Eyes: Pupils are equal, round, and reactive to light.  Neck: Normal range of motion. Neck supple. No thyromegaly present.  Cardiovascular: Normal rate, regular rhythm and normal heart sounds.   No murmur heard. Pulmonary/Chest: Effort normal and breath sounds normal. No  respiratory distress.  Neurological: He is alert and oriented to person, place, and time.  Skin: Skin is warm and dry. No rash noted.  Psychiatric: He has a normal mood and affect. His behavior is normal.  Nursing note and vitals reviewed.     Results for orders placed or performed in visit on 04/20/15  POCT CBC  Result Value Ref Range   WBC 28.6 (A) 4.6 - 10.2 K/uL   Lymph, poc 2.8 0.6 - 3.4   POC LYMPH PERCENT 9.9 (A) 10 - 50 %L   MID (cbc) 0.4 0 - 0.9   POC MID % 1.4 0 - 12 %M   POC Granulocyte 25.4 (A) 2 - 6.9   Granulocyte percent 88.7 (A) 37 - 80 %G   RBC 4.34 (A) 4.69 - 6.13 M/uL    Hemoglobin 13.4 (A) 14.1 - 18.1 g/dL   HCT, POC 81.1 (A) 91.4 - 53.7 %   MCV 93.2 80 - 97 fL   MCH, POC 31.0 27 - 31.2 pg   MCHC 33.2 31.8 - 35.4 g/dL   RDW, POC 78.2 %   Platelet Count, POC 223 142 - 424 K/uL   MPV 9.0 0 - 99.8 fL    Assessment & Plan:  Abscess, peritonsillar -R  Meds ordered this encounter  Medications  . cefTRIAXone (ROCEPHIN) injection 1 g given in the office   . amoxicillin (AMOXIL) 500 MG capsule    Sig: Take 2 capsules (1,000 mg total) by mouth 2 (two) times daily.    Dispense:  40 capsule    Refill:  0  . predniSONE (DELTASONE) 20 MG tablet    Sig: 3/3/2/2/1/1 single daily dose for 6 days    Dispense:  12 tablet    Refill:  0   Recheck in 24-48 hours  ENT for I&D if not responding  I have completed the patient encounter in its entirety as documented by the scribe, with editing by me where necessary. Yavier Snider P. Merla Riches, M.D.

## 2015-04-21 ENCOUNTER — Telehealth (HOSPITAL_BASED_OUTPATIENT_CLINIC_OR_DEPARTMENT_OTHER): Payer: Self-pay | Admitting: Emergency Medicine

## 2015-04-21 NOTE — Telephone Encounter (Signed)
Post ED Visit - Positive Culture Follow-up  Culture report reviewed by antimicrobial stewardship pharmacist: []  Wes Dulaney, Pharm.D., BCPS []  Celedonio MiyamotoJeremy Frens, [address] BCPS []  Georgina PillionElizabeth Martin, Pharm.D., BCPS []  RioMinh Pham, [address]Ph[address] BCPS, AAHIVP []  Estella HuskMichelle Turner, Pharm.D., BCPS, AAHIVP []  Elder CyphersLorie Poole, [address] BCPS Garvin FilaMike Maccia PharmD  Positive strep culture Treated with amoxicillin and rocephin in PCP office, organism sensitive to the same and no further patient follow-up is required at this time.  Berle MullMiller, Darlyne Schmiesing 04/21/2015, 8:35 AM

## 2015-11-20 ENCOUNTER — Emergency Department (HOSPITAL_COMMUNITY)
Admission: EM | Admit: 2015-11-20 | Discharge: 2015-11-20 | Disposition: A | Discharge status: Home / Self Care | Payer: Commercial Managed Care - HMO | Attending: Emergency Medicine | Admitting: Emergency Medicine

## 2015-11-20 ENCOUNTER — Encounter (HOSPITAL_COMMUNITY): Payer: Self-pay | Admitting: Family Medicine

## 2015-11-20 DIAGNOSIS — Z8639 Personal history of other endocrine, nutritional and metabolic disease: Secondary | ICD-10-CM | POA: Insufficient documentation

## 2015-11-20 DIAGNOSIS — I10 Essential (primary) hypertension: Secondary | ICD-10-CM | POA: Diagnosis not present

## 2015-11-20 DIAGNOSIS — Z79899 Other long term (current) drug therapy: Secondary | ICD-10-CM | POA: Diagnosis not present

## 2015-11-20 DIAGNOSIS — M542 Cervicalgia: Secondary | ICD-10-CM

## 2015-11-20 DIAGNOSIS — F319 Bipolar disorder, unspecified: Secondary | ICD-10-CM | POA: Insufficient documentation

## 2015-11-20 LAB — CBC WITH DIFFERENTIAL/PLATELET
BASOS ABS: 0 10*3/uL (ref 0.0–0.1)
Basophils Relative: 0 %
EOS ABS: 0.1 10*3/uL (ref 0.0–0.7)
Eosinophils Relative: 1 %
HCT: 41.3 % (ref 39.0–52.0)
Hemoglobin: 13.9 g/dL (ref 13.0–17.0)
LYMPHS ABS: 4.5 10*3/uL — AB (ref 0.7–4.0)
LYMPHS PCT: 31 %
MCH: 32.6 pg (ref 26.0–34.0)
MCHC: 33.7 g/dL (ref 30.0–36.0)
MCV: 96.7 fL (ref 78.0–100.0)
Monocytes Absolute: 0.6 10*3/uL (ref 0.1–1.0)
Monocytes Relative: 4 %
NEUTROS ABS: 9.3 10*3/uL — AB (ref 1.7–7.7)
Neutrophils Relative %: 64 %
Platelets: 244 10*3/uL (ref 150–400)
RBC: 4.27 MIL/uL (ref 4.22–5.81)
RDW: 12.8 % (ref 11.5–15.5)
WBC: 14.5 10*3/uL — ABNORMAL HIGH (ref 4.0–10.5)

## 2015-11-20 LAB — BASIC METABOLIC PANEL
Anion gap: 10 (ref 5–15)
BUN: 12 mg/dL (ref 6–20)
CALCIUM: 9.1 mg/dL (ref 8.9–10.3)
CO2: 25 mmol/L (ref 22–32)
CREATININE: 0.81 mg/dL (ref 0.61–1.24)
Chloride: 105 mmol/L (ref 101–111)
GFR calc Af Amer: >60 mL/min (ref >60)
Glucose, Bld: 98 mg/dL (ref 65–99)
Potassium: 4.5 mmol/L (ref 3.5–5.1)
SODIUM: 140 mmol/L (ref 135–145)

## 2015-11-20 MED ORDER — CYCLOBENZAPRINE HCL 10 MG PO TABS: 10.0000 mg | ORAL_TABLET | Freq: Two times a day (BID) | ORAL | Status: DC | PRN

## 2015-11-20 MED ORDER — PREDNISONE 20 MG PO TABS: ORAL_TABLET | ORAL | Status: DC

## 2015-11-20 MED ORDER — PREDNISONE 20 MG PO TABS
60.0000 mg | ORAL_TABLET | Freq: Once | ORAL | Status: AC
  Administered 2015-11-20: 60 mg via ORAL
  Filled 2015-11-20: qty 3

## 2015-11-20 MED ORDER — KETOROLAC TROMETHAMINE 60 MG/2ML IM SOLN
60.0000 mg | Freq: Once | INTRAMUSCULAR | Status: AC
  Administered 2015-11-20: 60 mg via INTRAMUSCULAR
  Filled 2015-11-20: qty 2

## 2015-11-20 NOTE — ED Notes (Signed)
Pt reports neck pain every winter d/t titanium plate. States he normally is given toradol and prednisone w/ relief.

## 2015-11-20 NOTE — ED Provider Notes (Signed)
CSN: [id number]     Arrival date & time 11/20/15  1450 History   First MD Initiated Contact with Patient 11/20/15 1850     Chief Complaint  Patient presents with  . Neck Pain     (Consider location/radiation/quality/duration/timing/severity/associated sxs/prior Treatment) HPI Comments: 56 year old male nonsmoker, history of neck surgery by Dr. Mikal Plane 2003 presents with left-sided neck pain similar to multiple previous episodes he gets approximately once yearly in the winter. No new injuries. No neurologic symptoms. Patient feels well otherwise tried over-the-counter medicines with mild relief. Patient said that prednisone has worked for him in the past.  Patient is a 55 y.o. male presenting with neck pain. The history is provided by the patient.  Neck Pain Associated symptoms: no chest pain, no fever and no headaches     Past Medical History  Diagnosis Date  . Hypertension   . Hyperlipidemia   . Depression     bipolar  . Mold exposure    Past Surgical History  Procedure Laterality Date  . Spine surgery    . Knees    . Cervical spine surgery  2003    fusion  . Back surgery     Family History  Problem Relation Age of Onset  . Cancer Mother    Social History  Substance Use Topics  . Smoking status: Never Smoker   . Smokeless tobacco: None  . Alcohol Use: No    Review of Systems  Constitutional: Negative for fever and chills.  HENT: Negative for congestion.   Eyes: Negative for visual disturbance.  Respiratory: Negative for shortness of breath.   Cardiovascular: Negative for chest pain.  Gastrointestinal: Negative for vomiting and abdominal pain.  Genitourinary: Negative for dysuria and flank pain.  Musculoskeletal: Positive for neck pain. Negative for back pain and neck stiffness.  Skin: Negative for rash.  Neurological: Negative for light-headedness and headaches.      Allergies  Review of patient's allergies indicates no known allergies.  Home Medications    Prior to Admission medications   Medication Sig Start Date End Date Taking? Authorizing Provider  amLODipine (NORVASC) 5 MG tablet Take 5 mg by mouth daily.    Historical Provider, MD  ARIPiprazole (ABILIFY) 10 MG tablet Take 10 mg by mouth daily.    Historical Provider, MD  buPROPion (WELLBUTRIN) 100 MG tablet Take 100 mg by mouth 2 (two) times daily.    Historical Provider, MD  chlorhexidine (PERIDEX) 0.12 % solution Use as directed 15 mLs in the mouth or throat 2 (two) times daily. 04/18/15   Fayrene Helper, PA-C  chlorthalidone (HYGROTON) 25 MG tablet Take 25 mg by mouth daily.    Historical Provider, MD  cyclobenzaprine (FLEXERIL) 10 MG tablet Take 1 tablet (10 mg total) by mouth 2 (two) times daily as needed for muscle spasms. 11/20/15   Blane Ohara, MD  lisinopril (PRINIVIL,ZESTRIL) 5 MG tablet Take 5 mg by mouth daily.    Historical Provider, MD  predniSONE (DELTASONE) 20 MG tablet 3/3/2/2/1/1 single daily dose for 6 days 04/20/15   Tonye Pearson, MD  predniSONE (DELTASONE) 20 MG tablet 3 tabs po day one, then 2 tabs daily x 4 days 11/20/15   Blane Ohara, MD  testosterone cypionate (DEPOTESTOTERONE CYPIONATE) 100 MG/ML injection Inject 200 mg into the muscle every 14 (fourteen) days. For IM use only    Historical Provider, MD   BP 156/83 mmHg  Pulse 82  Temp(Src) 98 F (36.7 C)  Resp 16  SpO2 97% Physical  Exam  Constitutional: He is oriented to person, place, and time. He appears well-developed and well-nourished.  HENT:  Head: Normocephalic and atraumatic.  Eyes: Conjunctivae are normal. Right eye exhibits no discharge. Left eye exhibits no discharge.  Neck: Normal range of motion. Neck supple. No tracheal deviation present.  Cardiovascular: Normal rate and regular rhythm.   Pulmonary/Chest: Effort normal and breath sounds normal.  Abdominal: Soft. He exhibits no distension. There is no tenderness. There is no guarding.  Musculoskeletal: He exhibits tenderness. He exhibits no  edema.  Mild left paraspinal cervical and left trapezius mild muscle tightness.  Neurological: He is alert and oriented to person, place, and time. GCS eye subscore is 4. GCS verbal subscore is 5. GCS motor subscore is 6.  Patient has 5+ strength with flexion extension of shoulders elbows and wrists. Sensation intact and major nerve groups.  Skin: Skin is warm. No rash noted.  Psychiatric: He has a normal mood and affect.  Nursing note and vitals reviewed.   ED Course  Procedures (including critical care time) Labs Review Labs Reviewed  CBC WITH DIFFERENTIAL/PLATELET - Abnormal; Notable for the following:    WBC 14.5 (*)    Neutro Abs 9.3 (*)    Lymphs Abs 4.5 (*)    All other components within normal limits  BASIC METABOLIC PANEL    Imaging Review No results found. I have personally reviewed and evaluated these images and lab results as part of my medical decision-making.   EKG Interpretation None      MDM   Final diagnoses:  Neck pain on left side   Patient presents with left-sided neck pain no injuries, no neuro deficits. Toradol and prednisone with outpatient follow-up muscle relaxant prescription.  Results and differential diagnosis were discussed with the patient/parent/guardian. Xrays were independently reviewed by myself.  Close follow up outpatient was discussed, comfortable with the plan.   Medications  predniSONE (DELTASONE) tablet 60 mg (not administered)  ketorolac (TORADOL) injection 60 mg (not administered)    Filed Vitals:   11/20/15 1719 11/20/15 1815  BP: 112/58 156/83  Pulse: 82 82  Temp: 98 F (36.7 C)   Resp: 18 16  SpO2: 96% 97%    Final diagnoses:  Neck pain on left side       Blane Ohara, MD 11/20/15 1918

## 2015-11-20 NOTE — ED Notes (Signed)
Pt here for neck pain and swelling to the left side of neck. sts hx of neck surgery and this happens every year about this time.

## 2015-11-20 NOTE — Discharge Instructions (Signed)
If you were given medicines take as directed.  If you are on coumadin or contraceptives realize their levels and effectiveness is altered by many different medicines.  If you have any reaction (rash, tongues swelling, other) to the medicines stop taking and see a physician.    If your blood pressure was elevated in the ER make sure you follow up for management with a primary doctor or return for chest pain, shortness of breath or stroke symptoms.  Please follow up as directed and return to the ER or see a physician for new or worsening symptoms.  Thank you. Filed Vitals:   11/20/15 1719 11/20/15 1815  BP: 112/58 156/83  Pulse: 82 82  Temp: 98 F (36.7 C)   Resp: 18 16  SpO2: 96% 97%

## 2016-02-09 DIAGNOSIS — Z0100 Encounter for examination of eyes and vision without abnormal findings: Secondary | ICD-10-CM | POA: Diagnosis not present

## 2016-02-09 DIAGNOSIS — H5210 Myopia, unspecified eye: Secondary | ICD-10-CM | POA: Diagnosis not present

## 2016-02-09 DIAGNOSIS — H524 Presbyopia: Secondary | ICD-10-CM | POA: Diagnosis not present

## 2016-02-11 DIAGNOSIS — R69 Illness, unspecified: Secondary | ICD-10-CM | POA: Diagnosis not present

## 2016-02-22 ENCOUNTER — Encounter (HOSPITAL_COMMUNITY): Payer: Self-pay | Admitting: *Deleted

## 2016-02-22 ENCOUNTER — Emergency Department (HOSPITAL_COMMUNITY)
Admission: EM | Admit: 2016-02-22 | Discharge: 2016-02-22 | Disposition: A | Discharge status: Home / Self Care | Payer: Commercial Managed Care - HMO | Attending: Emergency Medicine | Admitting: Emergency Medicine

## 2016-02-22 DIAGNOSIS — M25512 Pain in left shoulder: Secondary | ICD-10-CM | POA: Insufficient documentation

## 2016-02-22 DIAGNOSIS — M549 Dorsalgia, unspecified: Secondary | ICD-10-CM

## 2016-02-22 DIAGNOSIS — M546 Pain in thoracic spine: Secondary | ICD-10-CM | POA: Insufficient documentation

## 2016-02-22 DIAGNOSIS — Z8639 Personal history of other endocrine, nutritional and metabolic disease: Secondary | ICD-10-CM | POA: Diagnosis not present

## 2016-02-22 DIAGNOSIS — R042 Hemoptysis: Secondary | ICD-10-CM | POA: Diagnosis not present

## 2016-02-22 DIAGNOSIS — F329 Major depressive disorder, single episode, unspecified: Secondary | ICD-10-CM | POA: Insufficient documentation

## 2016-02-22 DIAGNOSIS — I10 Essential (primary) hypertension: Secondary | ICD-10-CM | POA: Insufficient documentation

## 2016-02-22 DIAGNOSIS — Z79899 Other long term (current) drug therapy: Secondary | ICD-10-CM | POA: Insufficient documentation

## 2016-02-22 MED ORDER — IBUPROFEN 800 MG PO TABS: 800.0000 mg | ORAL_TABLET | Freq: Three times a day (TID) | ORAL | Status: DC

## 2016-02-22 MED ORDER — PREDNISONE 20 MG PO TABS: ORAL_TABLET | ORAL | Status: DC

## 2016-02-22 MED ORDER — KETOROLAC TROMETHAMINE 60 MG/2ML IM SOLN
60.0000 mg | Freq: Once | INTRAMUSCULAR | Status: AC
  Administered 2016-02-22: 60 mg via INTRAMUSCULAR
  Filled 2016-02-22: qty 2

## 2016-02-22 NOTE — ED Notes (Signed)
Declined W/C at D/C and was escorted to lobby by RN. 

## 2016-02-22 NOTE — ED Notes (Signed)
Pt reports hx of spinal fusion, increase in left side neck and back pain/spasms. Increases with movement. Reports this is usually relieved with toradol inj.

## 2016-02-22 NOTE — Discharge Instructions (Signed)

## 2016-02-22 NOTE — ED Provider Notes (Signed)
CSN: [id number]     Arrival date & time 02/22/16  1103 History  By signing my name below, I, Sonum Patel, attest that this documentation has been prepared under the direction and in the presence of Fayrene Helper, PA-C . Electronically Signed: Sonum Patel, Neurosurgeon. 02/22/2016. 1:09 PM.    Chief Complaint  Patient presents with  . Back Pain    The history is provided by the patient. No language interpreter was used.     HPI Comments: JADDEN YIM is a 55 y.o. male with past medical history of cervical spinal fusion who presents to the Emergency Department complaining of intermittent left shoulder pain and back pain that began a couple of months ago. He states the pain is brought on by cold weather and reports similar episodes in the past. He describes the pain as sharp and stabbing in nature. He denies fever, hemoptysis.    Past Medical History  Diagnosis Date  . Hypertension   . Hyperlipidemia   . Depression     bipolar  . Mold exposure    Past Surgical History  Procedure Laterality Date  . Spine surgery    . Knees    . Cervical spine surgery  2003    fusion  . Back surgery     Family History  Problem Relation Age of Onset  . Cancer Mother    Social History  Substance Use Topics  . Smoking status: Never Smoker   . Smokeless tobacco: None  . Alcohol Use: No    Review of Systems  Constitutional: Negative for fever.  Respiratory:       -hemoptysis       Allergies  Review of patient's allergies indicates no known allergies.  Home Medications   Prior to Admission medications   Medication Sig Start Date End Date Taking? Authorizing Provider  amLODipine (NORVASC) 5 MG tablet Take 5 mg by mouth daily.    Historical Provider, MD  ARIPiprazole (ABILIFY) 10 MG tablet Take 10 mg by mouth daily.    Historical Provider, MD  buPROPion (WELLBUTRIN) 100 MG tablet Take 100 mg by mouth 2 (two) times daily.    Historical Provider, MD  chlorhexidine (PERIDEX) 0.12 % solution Use  as directed 15 mLs in the mouth or throat 2 (two) times daily. 04/18/15   Fayrene Helper, PA-C  chlorthalidone (HYGROTON) 25 MG tablet Take 25 mg by mouth daily.    Historical Provider, MD  cyclobenzaprine (FLEXERIL) 10 MG tablet Take 1 tablet (10 mg total) by mouth 2 (two) times daily as needed for muscle spasms. 11/20/15   Blane Ohara, MD  lisinopril (PRINIVIL,ZESTRIL) 5 MG tablet Take 5 mg by mouth daily.    Historical Provider, MD  predniSONE (DELTASONE) 20 MG tablet 3/3/2/2/1/1 single daily dose for 6 days 04/20/15   Tonye Pearson, MD  predniSONE (DELTASONE) 20 MG tablet 3 tabs po day one, then 2 tabs daily x 4 days 11/20/15   Blane Ohara, MD  testosterone cypionate (DEPOTESTOTERONE CYPIONATE) 100 MG/ML injection Inject 200 mg into the muscle every 14 (fourteen) days. For IM use only    Historical Provider, MD   BP 141/81 mmHg  Pulse 75  Temp(Src) 98.2 F (36.8 C) (Oral)  Resp 18  SpO2 96% Physical Exam  Constitutional: He is oriented to person, place, and time. He appears well-developed and well-nourished.  HENT:  Head: Normocephalic and atraumatic.  Cardiovascular: Normal rate.   Pulmonary/Chest: Effort normal.  Musculoskeletal:  Tenderness to left para thoracic region  on palpation. Tenderness also along left scapular region without overlying skin change. Left shoulder and neck with full ROM.   Neurological: He is alert and oriented to person, place, and time.  Skin: Skin is warm and dry.  Psychiatric: He has a normal mood and affect.  Nursing note and vitals reviewed.   ED Course  Procedures (including critical care time)  DIAGNOSTIC STUDIES: Oxygen Saturation is 96% on RA, adequate by my interpretation.    COORDINATION OF CARE: 1:13 PM Acute on chronic upper back pain.  No red flags.  Will give Toradol injection and discharge home with prednisone and ibuprofen. Discussed treatment plan with pt at bedside and pt agreed to plan.     MDM   Final diagnoses:  Upper back  pain on left side    BP 141/81 mmHg  Pulse 75  Temp(Src) 98.2 F (36.8 C) (Oral)  Resp 18  SpO2 96%   I personally performed the services described in this documentation, which was scribed in my presence. The recorded information has been reviewed and is accurate.     Fayrene HelperBowie Natosha Bou, PA-C 02/22/16 1342  Alvira MondayErin Schlossman, MD 02/23/16 1310

## 2016-03-20 ENCOUNTER — Emergency Department (HOSPITAL_COMMUNITY)
Admission: EM | Admit: 2016-03-20 | Discharge: 2016-03-20 | Disposition: A | Discharge status: Home / Self Care | Payer: Commercial Managed Care - HMO | Attending: Emergency Medicine | Admitting: Emergency Medicine

## 2016-03-20 ENCOUNTER — Encounter (HOSPITAL_COMMUNITY): Payer: Self-pay

## 2016-03-20 DIAGNOSIS — I10 Essential (primary) hypertension: Secondary | ICD-10-CM | POA: Diagnosis not present

## 2016-03-20 DIAGNOSIS — F319 Bipolar disorder, unspecified: Secondary | ICD-10-CM | POA: Diagnosis not present

## 2016-03-20 DIAGNOSIS — M542 Cervicalgia: Secondary | ICD-10-CM | POA: Diagnosis not present

## 2016-03-20 DIAGNOSIS — E785 Hyperlipidemia, unspecified: Secondary | ICD-10-CM | POA: Diagnosis not present

## 2016-03-20 DIAGNOSIS — M549 Dorsalgia, unspecified: Secondary | ICD-10-CM | POA: Diagnosis not present

## 2016-03-20 MED ORDER — KETOROLAC TROMETHAMINE 30 MG/ML IJ SOLN
30.0000 mg | Freq: Once | INTRAMUSCULAR | Status: AC
  Administered 2016-03-20: 30 mg via INTRAMUSCULAR
  Filled 2016-03-20: qty 1

## 2016-03-20 NOTE — Discharge Instructions (Signed)
You have neck pain, possibly from a cervical strain and/or pinched nerve.  ° °SEEK IMMEDIATE MEDICAL ATTENTION IF: °You develop difficulties swallowing or breathing.  °You have new or worse numbness, weakness, tingling, or movement problems in your arms or legs.  °You develop increasing pain which is uncontrolled with medications.  °You have change in bowel or bladder function, or other concerns. ° °SEEK IMMEDIATE MEDICAL ATTENTION IF: °New numbness, tingling, weakness, or problem with the use of your arms or legs.  °Severe back pain not relieved with medications.  °Change in bowel or bladder control (if you lose control of stool or urine, or if you are unable to urinate) °Increasing pain in any areas of the body (such as chest or abdominal pain).  °Shortness of breath, dizziness or fainting.  °Nausea (feeling sick to your stomach), vomiting, fever, or sweats. ° °

## 2016-03-20 NOTE — ED Notes (Signed)
Bed: WA03 Expected date:  Expected time:  Means of arrival:  Comments: 

## 2016-03-20 NOTE — ED Notes (Signed)
Pt. C/o left upper back pain. Pain is chronic but has become worse over the past month. Pt has a doctor's appointment for next Friday for it, put pain is too bad to wait. Hx of spinal fusion.

## 2016-03-20 NOTE — ED Provider Notes (Signed)
CSN: [id number]     Arrival date & time 03/20/16  0055 History  By signing my name below, I, Phillis Haggis, attest that this documentation has been prepared under the direction and in the presence of Zadie Rhine, MD. Electronically Signed: Phillis Haggis, ED Scribe. 03/20/2016. 4:01 AM.   Chief Complaint  Patient presents with  . Back Pain   Patient is a 55 y.o. male presenting with back pain. The history is provided by the patient. No language interpreter was used.  Back Pain Location:  Generalized Quality: sharp. Radiates to:  Does not radiate Pain severity:  Mild Onset quality:  Gradual Duration:  1 month Timing:  Constant Progression:  Worsening Chronicity:  Chronic Relieved by: Toradol. Worsened by:  Sitting (being still) Associated symptoms: no abdominal pain, no bladder incontinence, no bowel incontinence, no chest pain, no fever, no headaches, no numbness and no weakness   HPI Comments: NUSSEN PULLIN is a 55 y.o. Male with a hx of HTN who presents to the Emergency Department complaining of worsening of his generalized chronic back pain onset one month ago. Pt reports a hx of C-spine fusion surgery. He is followed by a doctor for the back pain but states that the pain is too painful to wait until next week. He reports relief with the Toradol and Cortisone shots he has received in the past. He states that his pain was fine as long as he was moving, but being still worsens the pain. He denies fever, chest pain, abdominal pain, vomiting, bladder or bowel incontinence, headaches, numbness, or weakness. He denies hx of bleeding disorder or kidney disorder.   Dr. Murtis Sink is who pt is due to see next week.   Past Medical History  Diagnosis Date  . Hypertension   . Hyperlipidemia   . Depression     bipolar  . Mold exposure    Past Surgical History  Procedure Laterality Date  . Spine surgery    . Knees    . Cervical spine surgery  2003    fusion  . Back surgery      Family History  Problem Relation Age of Onset  . Cancer Mother    Social History  Substance Use Topics  . Smoking status: Never Smoker   . Smokeless tobacco: None  . Alcohol Use: No    Review of Systems  Constitutional: Negative for fever.  Cardiovascular: Negative for chest pain.  Gastrointestinal: Negative for vomiting, abdominal pain and bowel incontinence.  Genitourinary: Negative for bladder incontinence.  Musculoskeletal: Positive for back pain.  Neurological: Negative for weakness, numbness and headaches.  All other systems reviewed and are negative.  Allergies  Review of patient's allergies indicates no known allergies.  Home Medications   Prior to Admission medications   Medication Sig Start Date End Date Taking? Authorizing Provider  amLODipine (NORVASC) 5 MG tablet Take 5 mg by mouth daily.    Historical Provider, MD  ARIPiprazole (ABILIFY) 10 MG tablet Take 10 mg by mouth daily.    Historical Provider, MD  buPROPion (WELLBUTRIN) 100 MG tablet Take 100 mg by mouth 2 (two) times daily.    Historical Provider, MD  chlorhexidine (PERIDEX) 0.12 % solution Use as directed 15 mLs in the mouth or throat 2 (two) times daily. 04/18/15   Fayrene Helper, PA-C  chlorthalidone (HYGROTON) 25 MG tablet Take 25 mg by mouth daily.    Historical Provider, MD  cyclobenzaprine (FLEXERIL) 10 MG tablet Take 1 tablet (10 mg total) by  mouth 2 (two) times daily as needed for muscle spasms. 11/20/15   Blane OharaJoshua Zavitz, MD  ibuprofen (ADVIL,MOTRIN) 800 MG tablet Take 1 tablet (800 mg total) by mouth 3 (three) times daily. 02/22/16   Fayrene HelperBowie Tran, PA-C  lisinopril (PRINIVIL,ZESTRIL) 5 MG tablet Take 5 mg by mouth daily.    Historical Provider, MD  predniSONE (DELTASONE) 20 MG tablet 3 tabs po day one, then 2 tabs daily x 4 days 02/22/16   Fayrene HelperBowie Tran, PA-C  testosterone cypionate (DEPOTESTOTERONE CYPIONATE) 100 MG/ML injection Inject 200 mg into the muscle every 14 (fourteen) days. For IM use only    Historical  Provider, MD   BP 149/80 mmHg  Pulse 69  Temp(Src) 98.1 F (36.7 C) (Oral)  Resp 15  SpO2 97% Physical Exam CONSTITUTIONAL: Well developed/well nourished HEAD: Normocephalic/atraumatic EYES: EOMI/PERRL ENMT: Mucous membranes moist NECK: supple no meningeal signs SPINE/BACK:entire spine nontender, left cervical paraspinal tenderness CV: S1/S2 noted, no murmurs/rubs/gallops noted LUNGS: Lungs are clear to auscultation bilaterally, no apparent distress ABDOMEN: soft, nontender, no rebound or guarding GU:no cva tenderness NEURO: Awake/alert, equal motor 5/5 strength noted with the following: hip flexion/knee flexion/extension, foot dorsi/plantar flexion, great toe extension intact bilaterally, no clonus bilaterally,  no sensory deficit in any dermatome.  Equal patellar/achilles reflex noted (2+) in bilateral lower extremities.  Pt is able to ambulate unassisted. Equal power (5/5) with hand grip, wrist flex/extension, elbow flex/extension, and equal power with shoulder abduction/adduction.  No focal sensory deficit to light touch is noted in either UE.   Equal (2+) biceps/brachioradialis reflex in bilateral UE EXTREMITIES: pulses normal, full ROM SKIN: warm, color normal PSYCH: no abnormalities of mood noted, alert and oriented to situation    ED Course  Procedures  Medications  ketorolac (TORADOL) 30 MG/ML injection 30 mg (30 mg Intramuscular Given 03/20/16 0417)    DIAGNOSTIC STUDIES: Oxygen Saturation is 97% on RA, normal by my interpretation.    COORDINATION OF CARE: 3:59 AM-Discussed treatment plan which includes Toradol shot with pt at bedside and pt agreed to plan.   Pt improved He is ambulatory  no focal weakness Discussed strict ER return precautions   MDM   Final diagnoses:  Neck pain  Back pain, unspecified location    Nursing notes including past medical history and social history reviewed and considered in documentation  I personally performed the services  described in this documentation, which was scribed in my presence. The recorded information has been reviewed and is accurate.       Zadie Rhineonald Alfreddie Consalvo, MD 03/20/16 [phone]

## 2016-08-17 ENCOUNTER — Encounter: Payer: Self-pay | Admitting: Primary Care

## 2016-08-17 ENCOUNTER — Ambulatory Visit (INDEPENDENT_AMBULATORY_CARE_PROVIDER_SITE_OTHER): Payer: Commercial Managed Care - HMO | Admitting: Primary Care

## 2016-08-17 VITALS — BP 146/88 | HR 98 | Temp 98.0°F | Ht 73.0 in | Wt 203.8 lb

## 2016-08-17 DIAGNOSIS — F319 Bipolar disorder, unspecified: Secondary | ICD-10-CM | POA: Insufficient documentation

## 2016-08-17 DIAGNOSIS — Z0000 Encounter for general adult medical examination without abnormal findings: Secondary | ICD-10-CM

## 2016-08-17 DIAGNOSIS — I10 Essential (primary) hypertension: Secondary | ICD-10-CM | POA: Diagnosis not present

## 2016-08-17 DIAGNOSIS — E785 Hyperlipidemia, unspecified: Secondary | ICD-10-CM | POA: Diagnosis not present

## 2016-08-17 LAB — COMPREHENSIVE METABOLIC PANEL
ALT: 27 U/L (ref 0–53)
AST: 25 U/L (ref 0–37)
Albumin: 4.5 g/dL (ref 3.5–5.2)
Alkaline Phosphatase: 73 U/L (ref 39–117)
BUN: 13 mg/dL (ref 6–23)
CALCIUM: 10 mg/dL (ref 8.4–10.5)
CHLORIDE: 103 meq/L (ref 96–112)
CO2: 32 meq/L (ref 19–32)
Creatinine, Ser: 0.77 mg/dL (ref 0.40–1.50)
GFR: 134.48 mL/min (ref >60.00)
Glucose, Bld: 95 mg/dL (ref 70–99)
POTASSIUM: 5 meq/L (ref 3.5–5.1)
Sodium: 141 mEq/L (ref 135–145)
Total Bilirubin: 0.6 mg/dL (ref 0.2–1.2)
Total Protein: 7.6 g/dL (ref 6.0–8.3)

## 2016-08-17 LAB — LIPID PANEL
CHOL/HDL RATIO: 5
Cholesterol: 183 mg/dL (ref 0–200)
HDL: 35 mg/dL — ABNORMAL LOW (ref >39.00)
NONHDL: 148.26
TRIGLYCERIDES: 275 mg/dL — AB (ref 0.0–149.0)
VLDL: 55 mg/dL — AB (ref 0.0–40.0)

## 2016-08-17 LAB — LDL CHOLESTEROL, DIRECT: LDL DIRECT: 98 mg/dL

## 2016-08-17 LAB — PSA: PSA: 1.98 ng/mL (ref 0.10–4.00)

## 2016-08-17 NOTE — Progress Notes (Signed)
Pre visit review using our clinic review tool, if applicable. No additional management support is needed unless otherwise documented below in the visit note. 

## 2016-08-17 NOTE — Progress Notes (Signed)
Subjective:    Patient ID: Adrian Cox, male    DOB: Jul 27, 1961, 55 y.o.   MRN: [id number]  HPI  Mr. Adrian Cox is a 55 year old male who presents today to establish care and discuss the problems mentioned below. Will obtain old records.  1) Essential Hypertension: Previously managed on Amlodipine 5 mg and Lisinopril 5 mg. He's not had his medication for the past 1 month. He's been checking his BP at home and is getting readings of 130's-140's/60's-70's. He's been working on improvements in his lifestyle through frequent exercise and improvements in diet. He denies chest pain, visual changes, shortness of breath.  2) Bipolar Disorder: Diagnosed in early 2000's. He was previously managed on Wellbutrin and Abilify. He stopped taking his medication for years as he developed suicidal thoughts. He is able to manage his symptoms well. He denies concerns for anxiety or depression.  3) Hyperlipidemia: Lipids panel in 2013 with Triglycerides of 227, TC and LDL stable. He was previously managed on medication which he's not taken in several years. He is leading an active lifestyle through healthy diet and regular exercise.  Immunizations: -Tetanus: Completed 5-6 years ago.  -Influenza: Completed in September 2017   Diet: He endorses a healthy diet Breakfast: Protein shake Lunch: Fruit Dinner: Meat, vegetables, starch Snacks: Pretzels, nuts, trail mix, fig newtons Desserts: Occasional candy Beverages: Water  Exercise: Daily Eye exam: Completed in 2017 Dental exam: Completes annually Colonoscopy: Completed at age 55. PSA: Completed in the past.    Review of Systems  Constitutional: Negative for unexpected weight change.  HENT: Negative for rhinorrhea.   Respiratory: Negative for cough and shortness of breath.   Cardiovascular: Negative for chest pain.  Gastrointestinal: Negative for constipation and diarrhea.  Genitourinary: Negative for difficulty urinating.  Musculoskeletal: Negative  for arthralgias and myalgias.  Skin: Negative for rash.  Allergic/Immunologic: Negative for environmental allergies.  Neurological: Negative for dizziness, numbness and headaches.  Psychiatric/Behavioral:       Prior history of bipolar disorder, no recent symptoms. Denies concerns for anxiety or depression       Past Medical History:  Diagnosis Date  . Depression    bipolar  . Diverticulosis   . Hyperlipidemia   . Hypertension   . Mold exposure      Social History   Social History  . Marital status: Married    Spouse name: N/A  . Number of children: N/A  . Years of education: N/A   Occupational History  . Not on file.   Social History Main Topics  . Smoking status: Never Smoker  . Smokeless tobacco: Not on file  . Alcohol use No  . Drug use: No     Comment: Hx marijuana use - none x 2 yrs  . Sexual activity: Yes   Other Topics Concern  . Not on file   Social History Narrative   Patient is married, has no children. He does not smoke drink or use any substances. He has a high school education. He is currently not employed. He does some walking, but really does not get a lot of regular exercise. He has membership at [employer] but he doesn't use it    Past Surgical History:  Procedure Laterality Date  . BACK SURGERY    . CERVICAL SPINE SURGERY  2003   fusion  . knees    . SPINE SURGERY      Family History  Problem Relation Age of Onset  . Cancer Mother  No Known Allergies  No current outpatient prescriptions on file prior to visit.   No current facility-administered medications on file prior to visit.     BP (!) 146/88   Pulse 98   Temp 98 F (36.7 C) (Oral)   Ht 6\' 1"  (1.854 m)   Wt 203 lb 12.8 oz (92.4 kg)   SpO2 97%   BMI 26.89 kg/m    Objective:   Physical Exam  Constitutional: He is oriented to person, place, and time. He appears well-nourished.  HENT:  Right Ear: Tympanic membrane and ear canal normal.  Left Ear: Tympanic membrane  and ear canal normal.  Nose: Nose normal. Right sinus exhibits no maxillary sinus tenderness and no frontal sinus tenderness. Left sinus exhibits no maxillary sinus tenderness and no frontal sinus tenderness.  Mouth/Throat: Oropharynx is clear and moist.  Eyes: Conjunctivae and EOM are normal. Pupils are equal, round, and reactive to light.  Neck: Neck supple. Carotid bruit is not present. No thyromegaly present.  Cardiovascular: Normal rate, regular rhythm and normal heart sounds.   Pulmonary/Chest: Effort normal and breath sounds normal. He has no wheezes. He has no rales.  Abdominal: Soft. Bowel sounds are normal. There is no tenderness.  Musculoskeletal: Normal range of motion.  Neurological: He is alert and oriented to person, place, and time. He has normal reflexes. No cranial nerve deficit.  Skin: Skin is warm and dry.  Psychiatric: He has a normal mood and affect.          Assessment & Plan:

## 2016-08-17 NOTE — Assessment & Plan Note (Signed)
Elevated trigs in 2013 based off of records in [facility]. Lipid panel pending today. Encouraged to continue healthy lifestyle.

## 2016-08-17 NOTE — Patient Instructions (Addendum)
Complete lab work prior to leaving today. I will notify you of your results once received.   Continue to monitor your blood pressure. Please report readings at or above 140/90 on a consistent basis.  Continue your efforts towards a healthy lifestyle through exercise and diet.  Ensure you are consuming 64 ounces of water daily.  It was a pleasure to meet you today! Please don't hesitate to call me with any questions. Welcome to [facility]!

## 2016-08-17 NOTE — Assessment & Plan Note (Signed)
Diagnosed years ago, managed on Abilify and Wellbutrin. No medications in years per patient. Feels well managed without medications.

## 2016-08-17 NOTE — Assessment & Plan Note (Addendum)
Immunizations UTD. Colonoscopy UTD. PSA due today. Commended him healthy lifestyle changes, encouraged to continue his efforts. Exam unremarkable. Labs pending. Follow up in 1 year for repeat physical.

## 2016-08-17 NOTE — Assessment & Plan Note (Signed)
Prior history, managed on Amlodipine 5 mg and Lisinopril 5 mg. Home BP readings mostly 130/70's.  Is working on diet and is exercising regularly. Will continue to monitor. If remains above goal of 140/90, then will add in low dose Amlodipine.

## 2016-08-18 ENCOUNTER — Encounter: Payer: Self-pay | Admitting: Primary Care

## 2016-08-24 ENCOUNTER — Telehealth: Payer: Self-pay | Admitting: Primary Care

## 2016-08-24 NOTE — Telephone Encounter (Signed)
Pt called to schedule a tenutus shot. Is it ok to schedule?

## 2016-08-24 NOTE — Telephone Encounter (Signed)
Called patient and scheduled a nurse appt on 08/30/2016.

## 2016-08-24 NOTE — Telephone Encounter (Signed)
Okay to schedule

## 2016-08-25 ENCOUNTER — Other Ambulatory Visit: Payer: Self-pay | Admitting: Primary Care

## 2016-08-25 ENCOUNTER — Encounter: Payer: Self-pay | Admitting: Primary Care

## 2016-08-25 DIAGNOSIS — Z1159 Encounter for screening for other viral diseases: Secondary | ICD-10-CM

## 2016-08-29 DIAGNOSIS — R0789 Other chest pain: Secondary | ICD-10-CM | POA: Diagnosis not present

## 2016-08-29 DIAGNOSIS — R079 Chest pain, unspecified: Secondary | ICD-10-CM | POA: Diagnosis not present

## 2016-08-29 DIAGNOSIS — I10 Essential (primary) hypertension: Secondary | ICD-10-CM | POA: Diagnosis not present

## 2016-08-30 ENCOUNTER — Ambulatory Visit: Payer: Commercial Managed Care - HMO

## 2016-08-30 ENCOUNTER — Emergency Department (HOSPITAL_COMMUNITY): Payer: Commercial Managed Care - HMO

## 2016-08-30 ENCOUNTER — Other Ambulatory Visit (INDEPENDENT_AMBULATORY_CARE_PROVIDER_SITE_OTHER): Payer: Commercial Managed Care - HMO

## 2016-08-30 ENCOUNTER — Encounter (HOSPITAL_COMMUNITY): Payer: Self-pay | Admitting: Emergency Medicine

## 2016-08-30 ENCOUNTER — Emergency Department (HOSPITAL_COMMUNITY)
Admission: EM | Admit: 2016-08-30 | Discharge: 2016-08-30 | Disposition: A | Discharge status: Home / Self Care | Payer: Commercial Managed Care - HMO | Attending: Emergency Medicine | Admitting: Emergency Medicine

## 2016-08-30 DIAGNOSIS — R079 Chest pain, unspecified: Secondary | ICD-10-CM

## 2016-08-30 DIAGNOSIS — Z1159 Encounter for screening for other viral diseases: Secondary | ICD-10-CM | POA: Diagnosis not present

## 2016-08-30 LAB — BASIC METABOLIC PANEL
ANION GAP: 10 (ref 5–15)
BUN: 12 mg/dL (ref 6–20)
CHLORIDE: 104 mmol/L (ref 101–111)
CO2: 26 mmol/L (ref 22–32)
Calcium: 9.1 mg/dL (ref 8.9–10.3)
Creatinine, Ser: 0.86 mg/dL (ref 0.61–1.24)
GFR calc Af Amer: >60 mL/min (ref >60)
GLUCOSE: 105 mg/dL — AB (ref 65–99)
POTASSIUM: 3.5 mmol/L (ref 3.5–5.1)
SODIUM: 140 mmol/L (ref 135–145)

## 2016-08-30 LAB — I-STAT TROPONIN, ED
TROPONIN I, POC: 0 ng/mL (ref 0.00–0.08)
Troponin i, poc: 0 ng/mL (ref 0.00–0.08)

## 2016-08-30 LAB — CBC
HEMATOCRIT: 38.5 % — AB (ref 39.0–52.0)
HEMOGLOBIN: 13.2 g/dL (ref 13.0–17.0)
MCH: 33.1 pg (ref 26.0–34.0)
MCHC: 34.3 g/dL (ref 30.0–36.0)
MCV: 96.5 fL (ref 78.0–100.0)
Platelets: 274 10*3/uL (ref 150–400)
RBC: 3.99 MIL/uL — ABNORMAL LOW (ref 4.22–5.81)
RDW: 12.2 % (ref 11.5–15.5)
WBC: 13.8 10*3/uL — AB (ref 4.0–10.5)

## 2016-08-30 NOTE — ED Triage Notes (Signed)
Pt. reports left chest pain with mild SOB onset this evening ,.denies nausea or diaphoresis .

## 2016-08-30 NOTE — ED Provider Notes (Signed)
By signing my name below, I, Linna DarnerRussell Turner, attest that this documentation has been prepared under the direction and in the presence of physician practitioner, Layla MawKristen N Ward, DO. Electronically Signed: Linna Darnerussell Turner, Scribe. 08/30/2016. 1:56 AM.  TIME SEEN: 1:56 AM  CHIEF COMPLAINT: Chest Pain  HPI: HPI Comments: Adrian Cox is a 55 y.o. male with PMHx significant for HTN and [name] who presents to the Emergency Department complaining of sudden onset, intermittent, aching, burning, left-sided chest pain beginning shortly PTA. He notes associated chills, palpitations, chest tightness, nausea, and SOB. He believes his symptoms are related to stress in his personal life. Pt states he works out every day and does not experience any CP, SOB while he is working out. He believes he would feel better if he were "left alone" to relax. He notes a h/o stress test many years ago. He denies h/o blood clot or cardiac catheterization. Pt is not a smoker. He denies vomiting, dizziness, headache, fever, cough, or any other associated symptoms. He notes he has an appointment with his PCP coming up at 9 AM this morning and intends to discuss his chest pain and associated symptoms.  No history of PE, DVT, exogenous estrogen use, fracture, surgery, trauma, hospitalization, prolonged travel. No lower extremity swelling or pain. No calf tenderness.   PCP: Vernona RiegerKatherine Clark, NP  ROS: See HPI Constitutional: no fever  Eyes: no drainage  ENT: no runny nose   Cardiovascular:  chest pain  Resp: SOB  GI: no vomiting GU: no dysuria Integumentary: no rash  Allergy: no hives  Musculoskeletal: no leg swelling  Neurological: no slurred speech ROS otherwise negative  PAST MEDICAL HISTORY/PAST SURGICAL HISTORY:  Past Medical History:  Diagnosis Date  . Depression    bipolar  . Diverticulosis   . Hyperlipidemia   . Hypertension   . Mold exposure     MEDICATIONS:  Prior to Admission medications   Not on File     ALLERGIES:  No Known Allergies  SOCIAL HISTORY:  Social History  Substance Use Topics  . Smoking status: Never Smoker  . Smokeless tobacco: Never Used  . Alcohol use No    FAMILY HISTORY: Family History  Problem Relation Age of Onset  . Cancer Mother     EXAM: BP 165/88 (BP Location: Right Arm)   Pulse 92   Temp 97.7 F (36.5 C) (Oral)   Resp 20   SpO2 98%  CONSTITUTIONAL: Alert and oriented and responds appropriately to questions. Well-appearing; well-nourished HEAD: Normocephalic EYES: Conjunctivae clear, PERRL, EOMI ENT: normal nose; no rhinorrhea; moist mucous membranes NECK: Supple, no meningismus, no nuchal rigidity, no LAD  CARD: RRR; S1 and S2 appreciated; no murmurs, no clicks, no rubs, no gallops CHEST: TTP to left chest wall with no crepitus, ecchymosis, deformity, rash, or lesions RESP: Normal chest excursion without splinting or tachypnea; breath sounds clear and equal bilaterally; no wheezes, no rhonchi, no rales, no hypoxia or respiratory distress, speaking full sentences ABD/GI: Normal bowel sounds; non-distended; soft, non-tender, no rebound, no guarding, no peritoneal signs, no hepatosplenomegaly BACK:  The back appears normal and is non-tender to palpation, there is no CVA tenderness EXT: Normal ROM in all joints; non-tender to palpation; no edema; normal capillary refill; no cyanosis, no calf tenderness or swelling    SKIN: Normal color for age and race; warm; no rash NEURO: Moves all extremities equally, sensation to light touch intact diffusely, cranial nerves II through XII intact, normal speech PSYCH: The patient's mood and manner  are appropriate. Grooming and personal hygiene are appropriate.  MEDICAL DECISION MAKING: Patient here with atypical chest pain. Suspect that this may be musculoskeletal or related to anxiety, stress. He states he is able to workout strenuously every day and never develops any symptoms. States he thinks his symptoms are  related to stress. He is currently fighting with his wife in the room. EKG shows no new ischemic abnormality. First troponin ordered in the waiting room is negative and chest x-ray clear. He declines any pain medication or any medication to help him relax at this time. Is asking to be "left alone". No risk factors for pulmonary embolus other than age.  Plan is to repeat second troponin 3 hours after the first. Patient is comfortable with this plan. Has PCP follow-up in the morning.  ED PROGRESS: Patient is asymptomatic. Reports feeling much better after he was able to relax. Second troponin negative. I feel he is safe to be discharged with close outpatient follow-up with his PCP that he has scheduled tomorrow.  At this time, I do not feel there is any life-threatening condition present. I have reviewed and discussed all results (EKG, imaging, lab, urine as appropriate) and exam findings with patient/family. I have reviewed nursing notes and appropriate previous records.  I feel the patient is safe to be discharged home without further emergent workup and can continue workup as an outpatient as needed. Discussed usual and customary return precautions. Patient/family verbalize understanding and are comfortable with this plan.  Outpatient follow-up has been provided. All questions have been answered.    EKG Interpretation  Date/Time:  Monday August 29 2016 23:55:24 EST Ventricular Rate:  92 PR Interval:  138 QRS Duration: 90 QT Interval:  346 QTC Calculation: 427 R Axis:   68 Text Interpretation:  Normal sinus rhythm Left ventricular hypertrophy Cannot rule out Septal infarct , age undetermined Abnormal ECG No significant change since last tracing Confirmed by WARD,  DO, KRISTEN ([id number]) on 08/30/2016 12:01:39 AM      I personally performed the services described in this documentation, which was scribed in my presence. The recorded information has been reviewed and is accurate.    Layla MawKristen N  Ward, DO 08/30/16 0730

## 2016-08-30 NOTE — Discharge Instructions (Signed)
Please follow-up with your primary care provider. This is unlikely cardiac in nature given you're able to workout every day strenuously and do not ever develop chest pain or shortness of breath. Your cardiac labs, chest x-ray were normal today. Your EKG showed no new changes compared to your prior EKGs.

## 2016-08-31 LAB — HEPATITIS C ANTIBODY: HCV AB: NEGATIVE

## 2016-11-04 ENCOUNTER — Telehealth: Payer: Self-pay | Admitting: Family Medicine

## 2016-11-04 ENCOUNTER — Encounter: Payer: Self-pay | Admitting: Family Medicine

## 2016-11-04 ENCOUNTER — Ambulatory Visit (INDEPENDENT_AMBULATORY_CARE_PROVIDER_SITE_OTHER): Payer: Commercial Managed Care - HMO | Admitting: Family Medicine

## 2016-11-04 DIAGNOSIS — R6889 Other general symptoms and signs: Secondary | ICD-10-CM | POA: Diagnosis not present

## 2016-11-04 NOTE — Progress Notes (Signed)
   Subjective:    Patient ID: Adrian Cox, male    DOB: 09-25-1961, 56 y.o.   MRN: [id number]  Cough  This is a new problem. The problem has been rapidly worsening. The cough is non-productive. Associated symptoms include chills, a fever, headaches, myalgias, nasal congestion and sweats. Pertinent negatives include no ear pain, postnasal drip, sore throat or wheezing. Associated symptoms comments: Nausea and one episode of emesis  decreased appetite  lightheaded  yesterday subjective fever. The symptoms are aggravated by lying down ( chills and sweats keeping him up at night.). Risk factors: nonsmoker. Treatments tried: flonase, ibuprofen, benadryl. The treatment provided mild relief. His past medical history is significant for environmental allergies. There is no history of asthma, COPD, emphysema or pneumonia.   No recent antibiotics.  Sick contacts: cleans medical byuildings   Review of Systems  Constitutional: Positive for chills and fever.  HENT: Negative for ear pain, postnasal drip and sore throat.   Respiratory: Positive for cough. Negative for wheezing.   Musculoskeletal: Positive for myalgias.  Allergic/Immunologic: Positive for environmental allergies.  Neurological: Positive for headaches.       Objective:   Physical Exam  Constitutional: Vital signs are normal. He appears well-developed and well-nourished.  Non-toxic appearance. He does not appear ill. No distress.  HENT:  Head: Normocephalic and atraumatic.  Right Ear: Hearing, tympanic membrane, external ear and ear canal normal. No tenderness. No foreign bodies. Tympanic membrane is not retracted and not bulging.  Left Ear: Hearing, tympanic membrane, external ear and ear canal normal. No tenderness. No foreign bodies. Tympanic membrane is not retracted and not bulging.  Nose: Nose normal. No mucosal edema or rhinorrhea. Right sinus exhibits no maxillary sinus tenderness and no frontal sinus tenderness. Left sinus  exhibits no maxillary sinus tenderness and no frontal sinus tenderness.  Mouth/Throat: Uvula is midline, oropharynx is clear and moist and mucous membranes are normal. Normal dentition. No dental caries. No oropharyngeal exudate or tonsillar abscesses.  Eyes: Conjunctivae, EOM and lids are normal. Pupils are equal, round, and reactive to light. Lids are everted and swept, no foreign bodies found.  Neck: Trachea normal, normal range of motion and phonation normal. Neck supple. Carotid bruit is not present. No thyroid mass and no thyromegaly present.  Cardiovascular: Normal rate, regular rhythm, S1 normal, S2 normal, normal heart sounds, intact distal pulses and normal pulses.  Exam reveals no gallop.   No murmur heard. Pulmonary/Chest: Effort normal and breath sounds normal. No respiratory distress. He has no wheezes. He has no rhonchi. He has no rales.  Abdominal: Soft. Normal appearance and bowel sounds are normal. There is no hepatosplenomegaly. There is no tenderness. There is no rebound, no guarding and no CVA tenderness. No hernia.  Neurological: He is alert. He has normal reflexes.  Skin: Skin is warm, dry and intact. No rash noted.  Psychiatric: He has a normal mood and affect. His speech is normal and behavior is normal. Judgment normal.          Assessment & Plan:

## 2016-11-04 NOTE — Telephone Encounter (Signed)
Pt answered the phone and when I advised I was calling back after team health had not been able to contact pt; I was hung up on; I called back and got v/m. Pt would not answer the phone again. FYI to Dr Ermalene SearingBedsole

## 2016-11-04 NOTE — Assessment & Plan Note (Addendum)
Most likely flu ( high pretest probability so did not don test given flu test shortage).. Given past 72 hours.. Not candidate for tamiflu.  Pt is healthy and low risk for serious flu complications. Discussed symptomatic care.  Hydration, rest. Call if SOB, cough worsening or prolongued fever. Reviewed flu timeline. Discussed family prophylaxis, his children will call pediatrition to consider tamiflu prophylaxis. He was advised to not return to work until 24 hour after fever resolved on no antipyretics.

## 2016-11-04 NOTE — Progress Notes (Signed)
Pre visit review using our clinic review tool, if applicable. No additional management support is needed unless otherwise documented below in the visit note. 

## 2016-11-04 NOTE — Patient Instructions (Addendum)
Rest, fluids. Ibuprofen 800 mg every 8 hour with food for fever or bodyaches. Call if SOB, cough worsening or prolongued fever.  He was advised to not return to work until 24 hour after fever resolved on no antipyretics.

## 2016-11-04 NOTE — Telephone Encounter (Signed)
[facility] Client TELEPHONE ADVICE RECORD [facility] Patient Name: Nat MathNTHONY Prewitt DOB: Aug 28, 1961 Initial Comment Caller was seen today, diagnosed with flu, was told to eat soup, now his stomach hurts Nurse Assessment Guidelines Guideline Title Affirmed Question Affirmed Notes Final Disposition User FINAL ATTEMPT MADE - message left Gaddy, RN, Sunny SchleinFelicia

## 2016-11-08 ENCOUNTER — Other Ambulatory Visit: Payer: Self-pay | Admitting: Primary Care

## 2016-11-08 DIAGNOSIS — E785 Hyperlipidemia, unspecified: Secondary | ICD-10-CM

## 2016-11-17 ENCOUNTER — Other Ambulatory Visit (INDEPENDENT_AMBULATORY_CARE_PROVIDER_SITE_OTHER): Payer: Medicare HMO

## 2016-11-17 DIAGNOSIS — E785 Hyperlipidemia, unspecified: Secondary | ICD-10-CM

## 2016-11-17 LAB — LIPID PANEL
CHOLESTEROL: 181 mg/dL (ref 0–200)
HDL: 32.5 mg/dL — ABNORMAL LOW (ref >39.00)
LDL Cholesterol: 131 mg/dL — ABNORMAL HIGH (ref 0–99)
NONHDL: 148.86
Total CHOL/HDL Ratio: 6
Triglycerides: 87 mg/dL (ref 0.0–149.0)
VLDL: 17.4 mg/dL (ref 0.0–40.0)

## 2016-11-30 ENCOUNTER — Encounter: Payer: Self-pay | Admitting: Family Medicine

## 2016-12-01 ENCOUNTER — Other Ambulatory Visit: Payer: Self-pay | Admitting: Primary Care

## 2016-12-01 DIAGNOSIS — F319 Bipolar disorder, unspecified: Secondary | ICD-10-CM

## 2016-12-28 ENCOUNTER — Encounter: Payer: Self-pay | Admitting: Primary Care

## 2016-12-28 NOTE — Telephone Encounter (Signed)
error 

## 2016-12-30 ENCOUNTER — Other Ambulatory Visit: Payer: Self-pay | Admitting: Primary Care

## 2017-01-03 ENCOUNTER — Ambulatory Visit (INDEPENDENT_AMBULATORY_CARE_PROVIDER_SITE_OTHER): Payer: Medicare HMO

## 2017-01-03 VITALS — BP 130/80 | HR 79 | Temp 98.0°F | Ht 71.25 in | Wt 205.5 lb

## 2017-01-03 DIAGNOSIS — Z114 Encounter for screening for human immunodeficiency virus [HIV]: Secondary | ICD-10-CM

## 2017-01-03 DIAGNOSIS — Z0000 Encounter for general adult medical examination without abnormal findings: Secondary | ICD-10-CM

## 2017-01-03 NOTE — Patient Instructions (Signed)
Adrian Cox , Thank you for taking time to come for your Medicare Wellness Visit. I appreciate your ongoing commitment to your health goals. Please review the following plan we discussed and let me know if I can assist you in the future.   These are the goals we discussed: Goals    . Increase physical activity          Starting 01/03/17, I will continue to exercise at least 60 min daily.        This is a list of the screening recommended for you and due dates:  Health Maintenance  Topic Date Due  . Tetanus Vaccine  01/04/2027*  . Colon Cancer Screening  07/17/2021  . Flu Shot  Completed  .  Hepatitis C: One time screening is recommended by Center for Disease Control  (CDC) for  adults born from [birth year] through 1965.   Completed  . HIV Screening  Completed  *Topic was postponed. The date shown is not the original due date.   Preventive Care for Adults  A healthy lifestyle and preventive care can promote health and wellness. Preventive health guidelines for adults include the following key practices.  . A routine yearly physical is a good way to check with your health care provider about your health and preventive screening. It is a chance to share any concerns and updates on your health and to receive a thorough exam.  . Visit your dentist for a routine exam and preventive care every 6 months. Brush your teeth twice a day and floss once a day. Good oral hygiene prevents tooth decay and gum disease.  . The frequency of eye exams is based on your age, health, family medical history, use  of contact lenses, and other factors. Follow your health care provider's ecommendations for frequency of eye exams.  . Eat a healthy diet. Foods like vegetables, fruits, whole grains, low-fat dairy products, and lean protein foods contain the nutrients you need without too many calories. Decrease your intake of foods high in solid fats, added sugars, and salt. Eat the right amount of calories for you. Get  information about a proper diet from your health care provider, if necessary.  . Regular physical exercise is one of the most important things you can do for your health. Most adults should get at least 150 minutes of moderate-intensity exercise (any activity that increases your heart rate and causes you to sweat) each week. In addition, most adults need muscle-strengthening exercises on 2 or more days a week.  Silver Sneakers may be a benefit available to you. To determine eligibility, you may visit the website: www.silversneakers.com or contact program at 432 193 [phone] Mon-Fri between 8AM-8PM.   . Maintain a healthy weight. The body mass index (BMI) is a screening tool to identify possible weight problems. It provides an estimate of body fat based on height and weight. Your health care provider can find your BMI and can help you achieve or maintain a healthy weight.   For adults 20 years and older: ? A BMI below 18.5 is considered underweight. ? A BMI of 18.5 to 24.9 is normal. ? A BMI of 25 to 29.9 is considered overweight. ? A BMI of 30 and above is considered obese.   . Maintain normal blood lipids and cholesterol levels by exercising and minimizing your intake of saturated fat. Eat a balanced diet with plenty of fruit and vegetables. Blood tests for lipids and cholesterol should begin at age 57 and be repeated every  5 years. If your lipid or cholesterol levels are high, you are over 50, or you are at high risk for heart disease, you may need your cholesterol levels checked more frequently. Ongoing high lipid and cholesterol levels should be treated with medicines if diet and exercise are not working.  . If you smoke, find out from your health care provider how to quit. If you do not use tobacco, please do not start.  . If you choose to drink alcohol, please do not consume more than 2 drinks per day. One drink is considered to be 12 ounces (355 mL) of beer, 5 ounces (148 mL) of wine, or 1.5  ounces (44 mL) of liquor.  . If you are 67-75 years old, ask your health care provider if you should take aspirin to prevent strokes.  . Use sunscreen. Apply sunscreen liberally and repeatedly throughout the day. You should seek shade when your shadow is shorter than you. Protect yourself by wearing long sleeves, pants, a wide-brimmed hat, and sunglasses year round, whenever you are outdoors.  . Once a month, do a whole body skin exam, using a mirror to look at the skin on your back. Tell your health care provider of new moles, moles that have irregular borders, moles that are larger than a pencil eraser, or moles that have changed in shape or color.

## 2017-01-03 NOTE — Progress Notes (Signed)
Pre visit review using our clinic review tool, if applicable. No additional management support is needed unless otherwise documented below in the visit note. 

## 2017-01-03 NOTE — Progress Notes (Signed)
Subjective:   Adrian Cox is a 56 y.o. male who presents for an Initial Medicare Annual Wellness Visit.  Review of Systems  N/A Cardiac Risk Factors include: advanced age (>31men, >79 women);male gender;dyslipidemia;hypertension    Objective:    Today's Vitals   01/03/17 1313 01/03/17 1314  BP: 130/80   Pulse: 79   Temp: 98 F (36.7 C)   TempSrc: Oral   SpO2: 94%   Weight: 205 lb 8 oz (93.2 kg)   Height: 5' 11.25" (1.81 m)   PainSc: 7  7   PainLoc: Shoulder    Body mass index is 28.46 kg/m.  Current Medications (verified) Outpatient Encounter Prescriptions as of 01/03/2017  Medication Sig  . cholecalciferol (VITAMIN D) 1000 units tablet Take 1,000 Units by mouth daily.  Marland Kitchen FIBER PO Take 4 tablets by mouth daily.  . Multiple Vitamin (MULTIVITAMIN WITH MINERALS) TABS tablet Take 1 tablet by mouth daily.  Marland Kitchen omega-3 acid ethyl esters (LOVAZA) 1 g capsule Take 2 g by mouth daily.   No facility-administered encounter medications on file as of 01/03/2017.     Allergies (verified) Patient has no known allergies.   History: Past Medical History:  Diagnosis Date  . Depression    bipolar  . Diverticulosis   . Hyperlipidemia   . Hypertension   . Mold exposure    Past Surgical History:  Procedure Laterality Date  . BACK SURGERY    . CERVICAL SPINE SURGERY  2003   fusion  . knees    . SPINE SURGERY     Family History  Problem Relation Age of Onset  . Cancer Mother    Social History   Occupational History  . Not on file.   Social History Main Topics  . Smoking status: Never Smoker  . Smokeless tobacco: Never Used  . Alcohol use No  . Drug use: No     Comment: Hx marijuana use - none x 2 yrs  . Sexual activity: Yes   Tobacco Counseling Counseling given: No   Activities of Daily Living In your present state of health, do you have any difficulty performing the following activities: 01/03/2017  Hearing? N  Vision? N  Difficulty concentrating or  making decisions? N  Walking or climbing stairs? N  Dressing or bathing? N  Doing errands, shopping? N  Preparing Food and eating ? N  Using the Toilet? N  In the past six months, have you accidently leaked urine? N  Do you have problems with loss of bowel control? N  Managing your Medications? N  Managing your Finances? N  Housekeeping or managing your Housekeeping? N  Some recent data might be hidden    Immunizations and Health Maintenance Immunization History  Administered Date(s) Administered  . Influenza-Unspecified 07/08/2016   There are no preventive care reminders to display for this patient.  Patient Care Team: Doreene Nest, NP as PCP - General (Internal Medicine)    Assessment:   This is a routine wellness examination for Adrian Cox.    Hearing Screening   125Hz  250Hz  500Hz  1000Hz  2000Hz  3000Hz  4000Hz  6000Hz  8000Hz   Right ear:   40 40 40  40    Left ear:   40 40 40  0    Vision Screening Comments: Last vision exam in 2017 @ [facility]    Dietary issues and exercise activities discussed: Current Exercise Habits: Home exercise routine, Type of exercise: strength training/weights;stretching;Other - see comments (cardio), Time (Minutes): 60, Frequency (Times/Week):  7, Weekly Exercise (Minutes/Week): 420, Intensity: Moderate, Exercise limited by: None identified  Goals    . Increase physical activity          Starting 01/03/17, I will continue to exercise at least 60 min daily.       Depression Screen PHQ 2/9 Scores 01/03/2017 04/20/2015  PHQ - 2 Score 0 0    Fall Risk Fall Risk  01/03/2017  Falls in the past year? No    Cognitive Function: MMSE - Mini Mental State Exam 01/03/2017  Orientation to time 5  Orientation to Place 5  Registration 3  Attention/ Calculation 0  Recall 3  Language- name 2 objects 0  Language- repeat 1  Language- follow 3 step command 3  Language- read & follow direction 0  Write a sentence 0  Copy design 0  Total score 20       PLEASE NOTE: A Mini-Cog screen was completed. Maximum score is 20. A value of 0 denotes this part of Folstein MMSE was not completed or the patient failed this part of the Mini-Cog screening.   Mini-Cog Screening Orientation to Time - Max 5 pts Orientation to Place - Max 5 pts Registration - Max 3 pts Recall - Max 3 pts Language Repeat - Max 1 pts Language Follow 3 Step Command - Max 3 pts     Screening Tests Health Maintenance  Topic Date Due  . TETANUS/TDAP  01/04/2027 (Originally 10/17/1979)  . COLONOSCOPY  07/17/2021  . INFLUENZA VACCINE  Completed  . Hepatitis C Screening  Completed  . HIV Screening  Completed        Plan:     I have personally reviewed and addressed the Medicare Annual Wellness questionnaire and have noted the following in the patient's chart:  A. Medical and social history B. Use of alcohol, tobacco or illicit drugs  C. Current medications and supplements D. Functional ability and status E.  Nutritional status F.  Physical activity G. Advance directives H. List of other physicians I.  Hospitalizations, surgeries, and ER visits in previous 12 months J.  Vitals K. Screenings to include hearing, vision, cognitive, depression L. Referrals and appointments - none  In addition, I have reviewed and discussed with patient certain preventive protocols, quality metrics, and best practice recommendations. A written personalized care plan for preventive services as well as general preventive health recommendations were provided to patient.  See attached scanned questionnaire for additional information.   Signed,   Randa EvensLesia Sophiah Rolin, MHA, BS, LPN Health Coach

## 2017-01-03 NOTE — Progress Notes (Signed)
PCP notes:   Health maintenance:  Tetanus - pt declined HIV screening - completed  Abnormal screenings:   Hearing - failed  Patient concerns:   None  Nurse concerns:  None  Next PCP appt:   01/10/17 @ 1445

## 2017-01-03 NOTE — Progress Notes (Signed)
I reviewed health advisor's note, was available for consultation, and agree with documentation and plan.  

## 2017-01-07 LAB — HIV ANTIBODY (ROUTINE TESTING W REFLEX): HIV: NONREACTIVE

## 2017-01-08 ENCOUNTER — Encounter: Payer: Self-pay | Admitting: Primary Care

## 2017-01-09 ENCOUNTER — Ambulatory Visit (INDEPENDENT_AMBULATORY_CARE_PROVIDER_SITE_OTHER)
Admission: RE | Admit: 2017-01-09 | Discharge: 2017-01-09 | Disposition: A | Discharge status: Home / Self Care | Payer: Medicare HMO | Source: Ambulatory Visit | Attending: Primary Care | Admitting: Primary Care

## 2017-01-09 ENCOUNTER — Ambulatory Visit (INDEPENDENT_AMBULATORY_CARE_PROVIDER_SITE_OTHER): Payer: Medicare HMO | Admitting: Primary Care

## 2017-01-09 ENCOUNTER — Encounter: Payer: Self-pay | Admitting: Primary Care

## 2017-01-09 VITALS — BP 142/80 | HR 83 | Temp 97.8°F | Ht 71.0 in | Wt 208.0 lb

## 2017-01-09 DIAGNOSIS — G8929 Other chronic pain: Secondary | ICD-10-CM

## 2017-01-09 DIAGNOSIS — E785 Hyperlipidemia, unspecified: Secondary | ICD-10-CM

## 2017-01-09 DIAGNOSIS — M25511 Pain in right shoulder: Secondary | ICD-10-CM

## 2017-01-09 DIAGNOSIS — I10 Essential (primary) hypertension: Secondary | ICD-10-CM | POA: Diagnosis not present

## 2017-01-09 MED ORDER — DICLOFENAC SODIUM 75 MG PO TBEC: 75.0000 mg | DELAYED_RELEASE_TABLET | Freq: Two times a day (BID) | ORAL | 0 refills | Status: DC | PRN

## 2017-01-09 NOTE — Assessment & Plan Note (Signed)
Present for years. Suspect frozen shoulder vs arthritic changes. Will obtain xray today. Will have him see Sports Medicine for evaluation and possible injection. Rx for Diclofenac sent to pharmacy.

## 2017-01-09 NOTE — Assessment & Plan Note (Signed)
Overall stable, will be seeing therapist later this week.

## 2017-01-09 NOTE — Assessment & Plan Note (Signed)
Slightly above goal today, home readings stable. BP last week in our clinic stable. Continue to monitor. Continue to work on healthy lifestyle changes.

## 2017-01-09 NOTE — Assessment & Plan Note (Signed)
LDL slightly above goal, continue healthy diet/exercise. Continue Fish Oil.

## 2017-01-09 NOTE — Patient Instructions (Addendum)
Complete xray(s) prior to leaving today. I will notify you of your results once received.  You may take diclofenac 75 mg tablets twice daily as needed for pain/inflammation of the shoulder. Do not take any ibuprofen, Motrin, naproxen, Aleve with this medication.  Schedule an appointment with Dr. Dallas Schimkeopeland for further evaluation of your shoulder.  Follow up in 1 year for your annual exam or sooner if needed.  It was a pleasure to see you today!

## 2017-01-09 NOTE — Progress Notes (Signed)
Pre visit review using our clinic review tool, if applicable. No additional management support is needed unless otherwise documented below in the visit note. 

## 2017-01-09 NOTE — Progress Notes (Signed)
Subjective:    Patient ID: Adrian Cox, male    DOB: January 06, 1961, 56 y.o.   MRN: [id number]  HPI  Adrian Cox is a 56 year old male who presents today for [place] Part 2.  He met with our health advisor last week.  1) Essential Hypertension: Previously managed on antihypertensives, no recent use in months. His BP in the office last week was 130/80 and today is 142/80. He denies chest pain, shortness of breath, dizziness. He's checking his BP at home which runs 130's/80's.  2) Bipolar Disorder: Currently following with therapy, first appointment is on March 29th. No recent medication use. Overall feels better off of medications. Denies SI/HI.   3) Hyperlipidemia: LDL slightly above goal on recent labs. Currently taking Lovaza 1 gm. He is working on improvement in diet and is exercising regularly.   4) Right Shoulder Pain: Present for years, worse for the last several months. Previously had injections with significant improvement. His pain is worse with abduction any higher than his right shoulder height. His last injection was about 1 year ago. He denies numbness/tingling, recent injury/trauma. He's able to swing a golf club and do some weight lifting without any pain. Anytime he's reaching above the height of his shoulder he will experience pain. He applies medical tape and takes Ibuprofen with some improvement.   Review of Systems  Eyes: Negative for visual disturbance.  Respiratory: Negative for shortness of breath.   Cardiovascular: Negative for chest pain.  Musculoskeletal: Positive for arthralgias and myalgias.  Neurological: Negative for dizziness, weakness, numbness and headaches.  Psychiatric/Behavioral: Negative for suicidal ideas. The patient is nervous/anxious.        Past Medical History:  Diagnosis Date  . Depression    bipolar  . Diverticulosis   . Hyperlipidemia   . Hypertension   . Mold exposure      Social History   Social History  . Marital status: Married   Spouse name: N/A  . Number of children: N/A  . Years of education: N/A   Occupational History  . Not on file.   Social History Main Topics  . Smoking status: Never Smoker  . Smokeless tobacco: Never Used  . Alcohol use No  . Drug use: No     Comment: Hx marijuana use - none x 2 yrs  . Sexual activity: Yes   Other Topics Concern  . Not on file   Social History Narrative   Patient is married, has no children. He does not smoke drink or use any substances. He has a high school education. He is currently not employed. He does some walking, but really does not get a lot of regular exercise. He has membership at [employer] but he doesn't use it    Past Surgical History:  Procedure Laterality Date  . BACK SURGERY    . CERVICAL SPINE SURGERY  2003   fusion  . knees    . SPINE SURGERY      Family History  Problem Relation Age of Onset  . Cancer Mother     No Known Allergies  Current Outpatient Prescriptions on File Prior to Visit  Medication Sig Dispense Refill  . cholecalciferol (VITAMIN D) 1000 units tablet Take 1,000 Units by mouth daily.    Marland Kitchen FIBER PO Take 4 tablets by mouth daily.    . Multiple Vitamin (MULTIVITAMIN WITH MINERALS) TABS tablet Take 1 tablet by mouth daily.    Marland Kitchen omega-3 acid ethyl esters (LOVAZA) 1 g  capsule Take 2 g by mouth daily.     No current facility-administered medications on file prior to visit.     BP (!) 142/80   Pulse 83   Temp 97.8 F (36.6 C) (Oral)   Ht 5' 11" (1.803 m)   Wt 208 lb (94.3 kg)   SpO2 96%   BMI 29.01 kg/m    Objective:   Physical Exam  Constitutional: He is oriented to person, place, and time. He appears well-nourished.  HENT:  Right Ear: Tympanic membrane and ear canal normal.  Left Ear: Tympanic membrane and ear canal normal.  Nose: Nose normal. Right sinus exhibits no maxillary sinus tenderness and no frontal sinus tenderness. Left sinus exhibits no maxillary sinus tenderness and no frontal sinus tenderness.    Mouth/Throat: Oropharynx is clear and moist.  Eyes: Conjunctivae and EOM are normal. Pupils are equal, round, and reactive to light.  Neck: Neck supple. Carotid bruit is not present. No thyromegaly present.  Cardiovascular: Normal rate, regular rhythm and normal heart sounds.   Pulmonary/Chest: Effort normal and breath sounds normal. He has no wheezes. He has no rales.  Abdominal: Soft. Bowel sounds are normal. There is no tenderness.  Musculoskeletal:       Right shoulder: He exhibits decreased range of motion and pain. He exhibits no tenderness, no bony tenderness and no deformity.  Pain with lateral and frontal abduction. Unable to lift past shoulder height.   Neurological: He is alert and oriented to person, place, and time. He has normal reflexes. No cranial nerve deficit.  Skin: Skin is warm and dry.  Psychiatric: He has a normal mood and affect.          Assessment & Plan:

## 2017-01-11 DIAGNOSIS — H524 Presbyopia: Secondary | ICD-10-CM | POA: Diagnosis not present

## 2017-01-12 ENCOUNTER — Encounter: Payer: Self-pay | Admitting: Family Medicine

## 2017-01-12 ENCOUNTER — Ambulatory Visit: Payer: Medicare HMO | Admitting: Psychology

## 2017-01-12 ENCOUNTER — Ambulatory Visit (INDEPENDENT_AMBULATORY_CARE_PROVIDER_SITE_OTHER): Payer: Medicare HMO | Admitting: Family Medicine

## 2017-01-12 VITALS — BP 124/70 | HR 71 | Temp 98.6°F | Ht 71.25 in | Wt 211.0 lb

## 2017-01-12 DIAGNOSIS — M545 Low back pain, unspecified: Secondary | ICD-10-CM

## 2017-01-12 DIAGNOSIS — M7581 Other shoulder lesions, right shoulder: Secondary | ICD-10-CM

## 2017-01-12 DIAGNOSIS — G8929 Other chronic pain: Secondary | ICD-10-CM

## 2017-01-12 NOTE — Progress Notes (Signed)
Pre visit review using our clinic review tool, if applicable. No additional management support is needed unless otherwise documented below in the visit note. 

## 2017-01-12 NOTE — Progress Notes (Signed)
Dr. Karleen HampshireSpencer T. Nimra Puccinelli, MD, CAQ Sports Medicine [facility] [address] [place], [id number] Phone: [phone] Fax: [phone]  01/12/2017  [name], MRN: [id number], DOB: 02/09/1961, 56 y.o.  Primary Physician:  Morrie Sheldonlark,Katherine Kendal, NP   Chief Complaint  Patient presents with  . Shoulder Pain    Right  . Gluteus Pain    Left   Subjective:   Adrian Cox is a 56 y.o. very pleasant male patient who presents with the following:  2003 had c-spine fusion and prior L-spine surgery.  The patient noted above presents with shoulder pain that has been ongoing for many years, worsened in the last few months.   there is no history of trauma or accident. The patient denies neck pain or radicular symptoms - but h/o c-spine surgery. Denies dislocation, subluxation, separation of the shoulder. The patient does complain of pain in the overhead plane and pain with bench press and overhead press at the gym.  Pain 1-2/10 with diclofenac now  Medications Tried: diclofenac - helping a lot. Also motrin. Ice or Heat: +/- Tried PT: No KT Tape helps a lot  Prior shoulder Injury: intermittent difficulities for years Prior surgery: No Prior fracture: No Has had injections in the past  Also with L sided gluteal pain - concerned it might be his hip.  Past Medical History, Surgical History, Social History, Family History, Problem List, Medications, and Allergies have been reviewed and updated if relevant.  Patient Active Problem List   Diagnosis Date Noted  . Chronic right shoulder pain 01/09/2017  . Essential hypertension 08/17/2016  . Bipolar disorder (HCC) 08/17/2016  . Preventative health care 08/17/2016  . Hyperlipidemia 08/17/2016    Past Medical History:  Diagnosis Date  . Depression    bipolar  . Diverticulosis   . Hyperlipidemia   . Hypertension   . Mold exposure     Past Surgical History:  Procedure Laterality Date    . BACK SURGERY    . CERVICAL SPINE SURGERY  2003   fusion  . knees    . SPINE SURGERY      Social History   Social History  . Marital status: Married    Spouse name: N/A  . Number of children: N/A  . Years of education: N/A   Occupational History  . Not on file.   Social History Main Topics  . Smoking status: Never Smoker  . Smokeless tobacco: Never Used  . Alcohol use No  . Drug use: No     Comment: Hx marijuana use - none x 2 yrs  . Sexual activity: Yes   Other Topics Concern  . Not on file   Social History Narrative   Patient is married, has no children. He does not smoke drink or use any substances. He has a high school education. He is currently not employed. He does some walking, but really does not get a lot of regular exercise. He has membership at [employer] but he doesn't use it    Family History  Problem Relation Age of Onset  . Cancer Mother     No Known Allergies  Medication list reviewed and updated in full in [facility].  GEN: No fevers, chills. Nontoxic. Primarily MSK c/o today. MSK: Detailed in the HPI GI: tolerating PO intake without difficulty Neuro: No numbness, parasthesias, or tingling associated. Otherwise the pertinent positives of the ROS are noted above.   Objective:   BP  124/70   Pulse 71   Temp 98.6 F (37 C) (Oral)   Ht 5' 11.25" (1.81 m)   Wt 211 lb (95.7 kg)   BMI 29.22 kg/m    GEN: WDWN, NAD, Non-toxic, Alert & Oriented x 3 HEENT: Atraumatic, Normocephalic.  Ears and Nose: No external deformity. EXTR: No clubbing/cyanosis/edema NEURO: Normal gait.  PSYCH: Normally interactive. Conversant. Not depressed or anxious appearing.  Calm demeanor.   Shoulder: R Inspection: No muscle wasting or winging Ecchymosis/edema: neg  AC joint, scapula, clavicle: NT Cervical spine: NT, full ROM Spurling's: neg Abduction: full, 5/5 Flexion: full, 5/5 IR, full, lift-off: 5/5 ER at neutral: full, 5/5 AC crossover and  compression: neg Neer: neg Hawkins: mild pos Drop Test: neg Empty Can: neg Supraspinatus insertion: NT Bicipital groove: NT Speed's: neg Yergason's: neg Sulcus sign: neg Scapular dyskinesis: none C5-T1 intact Sensation intact Grip 5/5    HIP EXAM: SIDE: L ROM: Abduction, Flexion, Internal and External range of motion: full Pain with terminal IROM and EROM: none GTB: NT SLR: NEG Knees: No effusion FABER: NT REVERSE FABER: NT, neg Piriformis: NT at direct palpation Str: flexion: 5/5 abduction: 5/5 adduction: 5/5 Strength testing non-tender     Radiology: Dg Shoulder Right  Result Date: 01/09/2017 CLINICAL DATA:  Chronic right shoulder pain and stiff scratch the right shoulder pain and stiffness since November, 2017. No known injury. EXAM: RIGHT SHOULDER - 2+ VIEW COMPARISON:  Plain films right shoulder 10/31/2014. FINDINGS: There is no evidence of fracture or dislocation. 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 The glenohumeral joint is unremarkable. Imaged right lung and ribs appear normal. Soft tissues are unremarkable. IMPRESSION: No notable change in mild acromioclavicular osteoarthritis. The study is otherwise negative. Electronically Signed   By: Drusilla Kanner M.D.   On: 01/09/2017 15:50   The radiological images were independently reviewed by myself in the office and results were reviewed with the patient. My independent interpretation of images:  No significant GH joint OA. Mild AC joint arthropathy only. Type 2 acromium. Electronically Signed  By: Adrian Beat, MD On: 01/13/2017 3:06 PM   Assessment and Plan:   Rotator cuff tendonitis, right  Chronic left-sided low back pain without sciatica  >25 minutes spent in face to face time with patient, >50% spent in counselling or coordination of care   Mild RTC tendinopathy improved with Voltaren. Discussed ways to alter his lifting to take stress off of his RTC. Basic RTC and scap training.   I would do  no other intervention unless he worsens.   L gluteus pain is referred from spine. He has done a great job losing 50 pounds, working on leg and core str. Continue great work.  I appreciate the opportunity to evaluate this very friendly patient. If you have any question regarding her care or prognosis, do not hesitate to ask.   Follow-up: No Follow-up on file.  Signed,  Elpidio Galea. Aero Drummonds, MD   Allergies as of 01/12/2017   No Known Allergies     Medication List       Accurate as of 01/12/17 11:59 PM. Always use your most recent med list.          aspirin EC 81 MG tablet Take 81 mg by mouth daily.   cholecalciferol 1000 units tablet Commonly known as:  VITAMIN D Take 1,000 Units by mouth daily.   diclofenac 75 MG EC tablet Commonly known as:  VOLTAREN Take 1 tablet (75 mg total) by mouth 2 (two) times daily as  needed for moderate pain.   FIBER PO Take 4 tablets by mouth daily.   multivitamin with minerals Tabs tablet Take 1 tablet by mouth daily.   omega-3 acid ethyl esters 1 g capsule Commonly known as:  LOVAZA Take 2 g by mouth daily.

## 2017-01-16 ENCOUNTER — Ambulatory Visit: Payer: Medicare HMO | Admitting: Psychology

## 2017-01-20 ENCOUNTER — Encounter: Payer: Self-pay | Admitting: Family Medicine

## 2017-01-20 NOTE — Telephone Encounter (Signed)
This is Kate's patient, but can you take care of getting him rescheduled or does he need to do this himself?  See the patient's mychart note.

## 2017-01-24 DIAGNOSIS — Z0100 Encounter for examination of eyes and vision without abnormal findings: Secondary | ICD-10-CM | POA: Diagnosis not present

## 2017-01-27 ENCOUNTER — Ambulatory Visit (INDEPENDENT_AMBULATORY_CARE_PROVIDER_SITE_OTHER): Payer: Medicare HMO | Admitting: Psychology

## 2017-01-27 ENCOUNTER — Encounter: Payer: Self-pay | Admitting: Family Medicine

## 2017-01-27 DIAGNOSIS — F411 Generalized anxiety disorder: Secondary | ICD-10-CM

## 2017-01-31 NOTE — Telephone Encounter (Signed)
Did you see the attachment?   It is a Physicist, medical from [facility] stating they are due for a colonoscopy.  Looks like Jae Dire is PCP.  Will forward to her.

## 2017-02-02 ENCOUNTER — Encounter: Payer: Self-pay | Admitting: Family Medicine

## 2017-02-02 DIAGNOSIS — Z1211 Encounter for screening for malignant neoplasm of colon: Secondary | ICD-10-CM

## 2017-02-04 ENCOUNTER — Other Ambulatory Visit: Payer: Self-pay | Admitting: Primary Care

## 2017-02-04 DIAGNOSIS — G8929 Other chronic pain: Secondary | ICD-10-CM

## 2017-02-04 DIAGNOSIS — M25511 Pain in right shoulder: Principal | ICD-10-CM

## 2017-02-06 NOTE — Telephone Encounter (Signed)
Ok to refill? Electronically refill request for diclofenac (VOLTAREN) 75 MG EC tablet. Last prescribed and seen on 01/09/2017.

## 2017-02-07 ENCOUNTER — Encounter: Payer: Self-pay | Admitting: Family Medicine

## 2017-02-08 NOTE — Telephone Encounter (Signed)
See my chart messages. I printed the letter he attached to My Chart and placed it in your box. Can you get this to [facility]? Please message patient to confirm.

## 2017-02-13 ENCOUNTER — Ambulatory Visit (INDEPENDENT_AMBULATORY_CARE_PROVIDER_SITE_OTHER): Payer: Medicare HMO | Admitting: Psychology

## 2017-02-13 DIAGNOSIS — F411 Generalized anxiety disorder: Secondary | ICD-10-CM

## 2017-02-14 ENCOUNTER — Encounter: Payer: Self-pay | Admitting: Family Medicine

## 2017-02-14 NOTE — Telephone Encounter (Signed)
Yes, please have the patient follow-up non-urgently

## 2017-02-20 ENCOUNTER — Ambulatory Visit (INDEPENDENT_AMBULATORY_CARE_PROVIDER_SITE_OTHER): Payer: Medicare HMO | Admitting: Psychology

## 2017-02-20 ENCOUNTER — Ambulatory Visit: Payer: Self-pay | Admitting: Family Medicine

## 2017-02-20 DIAGNOSIS — F411 Generalized anxiety disorder: Secondary | ICD-10-CM

## 2017-02-22 ENCOUNTER — Encounter: Payer: Self-pay | Admitting: Family Medicine

## 2017-02-23 ENCOUNTER — Telehealth: Payer: Self-pay

## 2017-02-23 ENCOUNTER — Ambulatory Visit (INDEPENDENT_AMBULATORY_CARE_PROVIDER_SITE_OTHER): Payer: Medicare HMO | Admitting: Sports Medicine

## 2017-02-23 ENCOUNTER — Ambulatory Visit: Payer: Medicare HMO | Admitting: Family Medicine

## 2017-02-23 ENCOUNTER — Emergency Department (HOSPITAL_COMMUNITY)
Admission: EM | Admit: 2017-02-23 | Discharge: 2017-02-23 | Disposition: A | Discharge status: Home / Self Care | Payer: Medicare HMO | Attending: Dermatology | Admitting: Dermatology

## 2017-02-23 ENCOUNTER — Ambulatory Visit (INDEPENDENT_AMBULATORY_CARE_PROVIDER_SITE_OTHER): Payer: Medicare HMO

## 2017-02-23 ENCOUNTER — Encounter (HOSPITAL_COMMUNITY): Payer: Self-pay | Admitting: *Deleted

## 2017-02-23 ENCOUNTER — Ambulatory Visit: Payer: Self-pay

## 2017-02-23 ENCOUNTER — Encounter: Payer: Self-pay | Admitting: Sports Medicine

## 2017-02-23 VITALS — BP 180/98 | HR 64 | Ht 71.0 in | Wt 205.0 lb

## 2017-02-23 DIAGNOSIS — M25511 Pain in right shoulder: Secondary | ICD-10-CM

## 2017-02-23 DIAGNOSIS — F319 Bipolar disorder, unspecified: Secondary | ICD-10-CM

## 2017-02-23 DIAGNOSIS — M4722 Other spondylosis with radiculopathy, cervical region: Secondary | ICD-10-CM

## 2017-02-23 DIAGNOSIS — Z5321 Procedure and treatment not carried out due to patient leaving prior to being seen by health care provider: Secondary | ICD-10-CM | POA: Diagnosis not present

## 2017-02-23 DIAGNOSIS — M50321 Other cervical disc degeneration at C4-C5 level: Secondary | ICD-10-CM | POA: Diagnosis not present

## 2017-02-23 DIAGNOSIS — G8929 Other chronic pain: Secondary | ICD-10-CM | POA: Diagnosis not present

## 2017-02-23 DIAGNOSIS — M479 Spondylosis, unspecified: Secondary | ICD-10-CM | POA: Insufficient documentation

## 2017-02-23 MED ORDER — GABAPENTIN 300 MG PO CAPS: ORAL_CAPSULE | ORAL | 1 refills | Status: DC

## 2017-02-23 NOTE — Assessment & Plan Note (Signed)
Patient does have some intrinsic rotator cuff strength weakness but I believe the majority of its pain and discomfort is secondary to radicular symptoms given the fact that he is improved with activities and has post isometric pain as opposed to concentric.  He has failed underlying prior subacromial injections as well as therapeutic exercises.  If any lack of improvement with the neurologic workup consider further diagnostic imaging with MSK ultrasound.

## 2017-02-23 NOTE — ED Triage Notes (Signed)
The pt is c/o rt shoulder pain for at least  A week.  He has arthritis in it and he wants it injected he has had injections in the past.  His regular doctor is out for 3 weeks  c/o pain

## 2017-02-23 NOTE — Telephone Encounter (Addendum)
Patient was not seen at [facility] ED, he left before seen, he was evaluated at [facility] this morning. Note reviewed.

## 2017-02-23 NOTE — Telephone Encounter (Signed)
Per chart review tab pt went to [place]. 

## 2017-02-23 NOTE — Assessment & Plan Note (Addendum)
Status post single level fusion at C6-7 by Dr. 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  Cautioned on possible exacerbation of underlying bipolar disorder.  We will plan to avoid steroids due to this as well.

## 2017-02-23 NOTE — Telephone Encounter (Signed)
Pt seen at [facility] location.  Will cc PCP and [facility] as fyi.

## 2017-02-23 NOTE — ED Notes (Signed)
Patient overheard this RN attempting to find room for patient in lobby.  Patient became agitated and states "Why can't we all have rooms?  Are you just going to make us all sit out here in pain?".  This Rn approached patient and discussed wait times, apologized for delay and his discomfort, and patient states "y'all just let people die out here".  This RN apologized again and patient returned to waiting.

## 2017-02-23 NOTE — Progress Notes (Signed)
OFFICE VISIT NOTE Veverly FellsMichael D. Delorise Shinerigby, DO  [facility] [phone]  Adrian Cox - 56 y.o. male MRN [id number]  Date of birth: 05/29/1961  Visit Date: 02/23/2017  PCP: Doreene Nestlark, Katherine K, NP   Referred by: Doreene Nestlark, Katherine K, NP  Brandy/CMA acting as scribe for Dr. Berline Choughigby.  SUBJECTIVE:   Chief Complaint  Patient presents with  . pain in right shoulder   HPI: As below and per problem based documentation when appropriate.  Pt c/o ongoing right shoulder pain for several years. Pain started after he has surgery on c-spine.  No specific injury to the shoulder.  He does play golf on a regular basis and has no significant pain with this.  The pain was rated as severe last night he went to the emergency department due to severity of the pain. Subacromial injection has been beneficial in the past.  He has had issues with bipolar disorder but has reportedly tolerated cortisone injections well. The pain is described as throbbing, sharp but dull at times and is rated as 8/10.  Worsened with inactivity, raising above his head. Improves with compression sleeve, heating pad, KT tape Therapies tried include Voltaren with some relief, steroid injections a couple of years ago.   Other associated symptoms include: neck pain, pain in trap.   Pt had xray 12/2016, results were as follows: IMPRESSION: No notable change in mild acromioclavicular osteoarthritis. The study is otherwise negative.    Review of Systems  Constitutional: Negative for chills and fever.  Respiratory: Negative for shortness of breath.   Cardiovascular: Negative for chest pain and palpitations.  Musculoskeletal: Positive for joint pain and neck pain.  Skin: Negative for rash.  Neurological: Positive for tingling. Negative for tremors.    Otherwise per HPI.  HISTORY & PERTINENT PRIOR DATA:  No specialty comments available. He reports that he has never smoked. He has  never used smokeless tobacco. No results for input(s): HGBA1C, LABURIC in the last 8760 hours. Medications & Allergies reviewed per EMR Patient Active Problem List   Diagnosis Date Noted  . Osteoarthritis of spine 02/23/2017  . Chronic right shoulder pain 01/09/2017  . Essential hypertension 08/17/2016  . Bipolar disorder (HCC) 08/17/2016  . Preventative health care 08/17/2016  . Hyperlipidemia 08/17/2016   Past Medical History:  Diagnosis Date  . Depression    bipolar  . Diverticulosis   . Hyperlipidemia   . Hypertension   . Mold exposure    Family History  Problem Relation Age of Onset  . Cancer Mother    Past Surgical History:  Procedure Laterality Date  . BACK SURGERY    . CERVICAL SPINE SURGERY  2003   fusion  . knees    . SPINE SURGERY     Social History   Occupational History  . Not on file.   Social History Main Topics  . Smoking status: Never Smoker  . Smokeless tobacco: Never Used  . Alcohol use No  . Drug use: No     Comment: Hx marijuana use - none x 2 yrs  . Sexual activity: Yes    OBJECTIVE:  VS:  HT:5\' 11"  (180.3 cm)   WT:205 lb (93 kg)  BMI:28.7    BP:(!) 180/98  HR:64bpm  TEMP: ( )  RESP:98 % EXAM:  Findings:  WDWN, NAD, Non-toxic appearing Alert & appropriately interactive.  Hypomanic and pleasant Not depressed or anxious appearing No increased work of breathing. Pupils are equal. EOM  intact without nystagmus No clubbing or cyanosis of the extremities appreciated No significant rashes/lesions/ulcerations overlying the examined area. Radial pulses 2+/4.  No significant generalized UE edema. Sensation intact to light touch in upper and lower extremities.  Right Shoulder and neck:  Normal alignment. No significant deformity.  Overall quite muscular. Generalized TTP over the anterior shoulder.  No pain over the [facility] joint or posterior shoulder. Marked pain with brachial plexus squeeze on the right as well as arm squeeze test on the  right.  No significant pain on the left. Cervical range of motion significantly limited in side bending and rotation.  He has no pain with Spurling's compression tests or Lhermitte's compression test.  Internal rotation strength is 5 out of 5.  Grip strength is 4 out of 5 in the right hand compared to the left which is normal. Empty can testing, speeds testing, O'Brien's testing are all painful with a minimal amount of weakness. Minimal pain with only mild crepitation with axial loading and circumduction. Once again he has mainly only post isometric contraction pain with no pain with active contraction of the rotator cuff.  2 view x-ray of the cervical spine obtained today shows stable hardware from prior single level fusion.  No lytic lesions.  He has progression of adjacent segment disease at C4-5 and C5-6     Dg Cervical Spine 2 Or 3 Views  Result Date: 02/23/2017 CLINICAL DATA:  Chronic right shoulder pain EXAM: CERVICAL SPINE - 2-3 VIEW COMPARISON:  10/31/2014 FINDINGS: C6-7 ACDF with solid bony fusion. Degenerative disc narrowing throughout the unfused levels, most advanced at C3-4 and C4-5 where there is greatest ventral spurring. Slight retrolisthesis versus posterior ridging at C3-4, C4-5, and C5-6. No evidence of bone lesion or endplate erosion. IMPRESSION: 1. Diffuse degenerative disc narrowing and spurring, stable from 10/31/2014. 2. C6-7 ACDF with solid bony fusion. Electronically Signed   By: Marnee Spring M.D.   On: 02/23/2017 09:35   ASSESSMENT & PLAN:   Problem List Items Addressed This Visit    Bipolar disorder (HCC)    Followed by Dr. Dellia Cloud.  Reports doing well with counseling and is currently not on any mood stabilizing medications.  Discussed mood altering effects of gabapentin as well as steroids and mutually decided on avoiding corticosteroids at this time.       Chronic right shoulder pain - Primary    Patient does have some intrinsic rotator cuff strength  weakness but I believe the majority of its pain and discomfort is secondary to radicular symptoms given the fact that he is improved with activities and has post isometric pain as opposed to concentric.  He has failed underlying prior subacromial injections as well as therapeutic exercises.  If any lack of improvement with the neurologic workup consider further diagnostic imaging with MSK ultrasound.       Relevant Medications   gabapentin (NEURONTIN) 300 MG capsule   Other Relevant Orders   DG Cervical Spine 2 or 3 views (Completed)   Osteoarthritis of spine    Status post single level fusion at C6-7 by Dr. 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  Cautioned on possible exacerbation of underlying bipolar disorder.  We will plan to avoid steroids due to this as well.         Follow-up: Return in about 4 weeks (around 03/23/2017) for repeat clinical exam.   CMA/ATC served as scribe during this visit. History, Physical, and  Plan performed by medical provider. Documentation and orders reviewed and attested to.      Gaspar Bidding, DO    [facility] Physician    02/23/2017 10:47 AM

## 2017-02-23 NOTE — Patient Instructions (Addendum)
I believe that your shoulder and arm pain are coming from your neck.  We are going to try medication to see if we can get it to calm down.  If you are not improving or have any worsening in your mood please be in touch as soon as possible.

## 2017-02-23 NOTE — Assessment & Plan Note (Signed)
Followed by Dr. Dellia CloudGutterman.  Reports doing well with counseling and is currently not on any mood stabilizing medications.  Discussed mood altering effects of gabapentin as well as steroids and mutually decided on avoiding corticosteroids at this time.

## 2017-02-23 NOTE — Telephone Encounter (Signed)
PLEASE NOTE: All timestamps contained within this report are represented as [place] Standard Time. CONFIDENTIALTY NOTICE: This fax transmission is intended only for the addressee. It contains information that is legally privileged, confidential or otherwise protected from use or disclosure. If you are not the intended recipient, you are strictly prohibited from reviewing, disclosing, copying using or disseminating any of this information or taking any action in reliance on or regarding this information. If you have received this fax in error, please notify us immediately by telephone so that we can arrange for its return to us. Phone: [phone], Toll-Free: [phone], Fax: [phone] Page: 1 of 1 Call Id: [id number] [facility] Night - Client TELEPHONE ADVICE RECORD [facility] Patient Name: Adrian Cox Gender: Male DOB: [birth year] Age: [age] Y 4 M 10 D Return Phone Number: [phone] (Primary), [phone] (Secondary) City/State/Zip: [place] [address] Client [facility] - Client Client Site [facility] - Night Physician Hannah Beatopland, [place] - MD Who Is Calling Patient / Member / Family / Caregiver Call Type Triage / Clinical Relationship To Patient Self Return Phone Number [phone] (Primary) Chief Complaint Pain - Generalized Reason for Call Symptomatic / Request for Health Information Initial Comment Caller has an appt for tomorrow and has a lot of pain wants to know if he needs to go to the ER he cant sleep due to the pain. Has Shoulder pain. Nurse Assessment Nurse: Tawanna Coolerodd, RN, Pam Date/Time Adrian Cox(Eastern Time): 02/23/2017 12:09:04 AM Confirm and document reason for call. If symptomatic, describe symptoms. ---Caller has an appt for tomorrow and has a lot of pain wants to know if he needs to go to the ER he cant sleep due to the pain. Has Shoulder pain. Does the PT have any chronic  conditions? (i.e. diabetes, asthma, etc.) ---Yes List chronic conditions. ---Arthritis in his shoulder Guidelines Guideline Title Affirmed Question Disp. Time Adrian Cox(Eastern Time) Disposition Final User 02/23/2017 12:17:56 AM Clinical Call Yes Tawanna Coolerodd, RN, Pam Comments User: Egbert GaribaldiPam, Todd, RN Date/Time Adrian Cox(Eastern Time): 02/23/2017 12:17:47 AM Caller stated he was tired of answering my questions and that he was getting irritated. Stated he was going to ED and hung up.

## 2017-03-06 ENCOUNTER — Ambulatory Visit: Payer: Medicare HMO | Admitting: Family Medicine

## 2017-03-17 ENCOUNTER — Ambulatory Visit (INDEPENDENT_AMBULATORY_CARE_PROVIDER_SITE_OTHER): Payer: Medicare HMO | Admitting: Psychology

## 2017-03-17 DIAGNOSIS — F411 Generalized anxiety disorder: Secondary | ICD-10-CM | POA: Diagnosis not present

## 2017-03-20 ENCOUNTER — Ambulatory Visit: Payer: Medicare HMO | Admitting: Family Medicine

## 2017-03-20 DIAGNOSIS — Z0289 Encounter for other administrative examinations: Secondary | ICD-10-CM

## 2017-03-21 ENCOUNTER — Telehealth: Payer: Self-pay | Admitting: Primary Care

## 2017-03-21 NOTE — Telephone Encounter (Signed)
No f/u unless patient contacts for f/u

## 2017-03-21 NOTE — Telephone Encounter (Signed)
Patient did not come in for their appointment on 03/20/17 for shoulder. Please let me know if patient needs to be contacted immediately for follow up or no follow up needed. Do you want to charge the NSF?

## 2017-03-23 ENCOUNTER — Ambulatory Visit: Payer: Medicare HMO | Admitting: Sports Medicine

## 2017-03-24 ENCOUNTER — Ambulatory Visit (INDEPENDENT_AMBULATORY_CARE_PROVIDER_SITE_OTHER): Payer: Medicare HMO | Admitting: Psychology

## 2017-03-24 ENCOUNTER — Other Ambulatory Visit: Payer: Self-pay

## 2017-03-24 ENCOUNTER — Encounter: Payer: Self-pay | Admitting: Sports Medicine

## 2017-03-24 ENCOUNTER — Telehealth: Payer: Self-pay | Admitting: Primary Care

## 2017-03-24 DIAGNOSIS — F411 Generalized anxiety disorder: Secondary | ICD-10-CM

## 2017-03-24 MED ORDER — GABAPENTIN 300 MG PO CAPS: ORAL_CAPSULE | ORAL | 2 refills | Status: DC

## 2017-03-24 NOTE — Telephone Encounter (Signed)
**  Remind patient they can make refill requests via MyChart**  Medication refill request (Name & Dosage):  gabapentin (NEURONTIN) 300 MG capsule  Preferred pharmacy (Name & Address):  [facility] [phone] - [place], [place] - [address]  Other comments (if applicable):

## 2017-03-24 NOTE — Telephone Encounter (Signed)
rx refilled.

## 2017-03-27 ENCOUNTER — Encounter: Payer: Self-pay | Admitting: Sports Medicine

## 2017-03-29 ENCOUNTER — Ambulatory Visit (INDEPENDENT_AMBULATORY_CARE_PROVIDER_SITE_OTHER): Payer: Medicare HMO | Admitting: Sports Medicine

## 2017-03-29 ENCOUNTER — Encounter: Payer: Self-pay | Admitting: Sports Medicine

## 2017-03-29 VITALS — BP 160/86 | HR 81 | Ht 71.0 in | Wt 208.0 lb

## 2017-03-29 DIAGNOSIS — G8929 Other chronic pain: Secondary | ICD-10-CM | POA: Diagnosis not present

## 2017-03-29 DIAGNOSIS — M25511 Pain in right shoulder: Secondary | ICD-10-CM | POA: Diagnosis not present

## 2017-03-29 DIAGNOSIS — M4722 Other spondylosis with radiculopathy, cervical region: Secondary | ICD-10-CM | POA: Diagnosis not present

## 2017-03-29 NOTE — Progress Notes (Signed)
OFFICE VISIT NOTE Veverly FellsMichael D. Delorise Shinerigby, DO  [place] [facility] [phone]  Adrian Cox - 56 y.o. male MRN [id number]  Date of birth: Feb 26, 1961  Visit Date: 03/29/2017  PCP: Doreene Nestlark, Katherine K, NP   Referred by: Doreene Nestlark, Katherine K, NP  Orlie DakinBrandy Shelton, CMA acting as scribe for Dr. Berline Choughigby.  SUBJECTIVE:   Chief Complaint  Patient presents with  . Follow-up    chronic right shoulder pain, osetoarthritis of c-spine   HPI: As below and per problem based documentation when appropriate.  Pt presents today in follow-up of chronic right shoulder pain and osteoarthritis of c-spine. He is still taking Gabapentin and getting relief from pain with this. He is no longer taking Diclofenac. He is still using his compression sleeve but has stopped using the KT tape. He reports that over all he is doing well. He doesn't have much but, just occasional stiffness. He is very happy because he can play golf now. He reports that his shoulder sometimes will feel "exhausted" after a long active day.   Pt denies fever, chills, night sweats.     Review of Systems  Constitutional: Negative for chills and fever.  Respiratory: Negative for shortness of breath and wheezing.   Cardiovascular: Negative for chest pain, palpitations and leg swelling.  Musculoskeletal: Negative for falls.  Neurological: Negative for dizziness, tingling and headaches.  Endo/Heme/Allergies: Does not bruise/bleed easily.    Otherwise per HPI.  HISTORY & PERTINENT PRIOR DATA:  No specialty comments available. He reports that he has never smoked. He has never used smokeless tobacco. No results for input(s): HGBA1C, LABURIC in the last 8760 hours. Medications & Allergies reviewed per EMR Patient Active Problem List   Diagnosis Date Noted  . Osteoarthritis of spine 02/23/2017  . Chronic right shoulder pain 01/09/2017  . Essential hypertension 08/17/2016  . Bipolar disorder (HCC)  08/17/2016  . Preventative health care 08/17/2016  . Hyperlipidemia 08/17/2016   Past Medical History:  Diagnosis Date  . Depression    bipolar  . Diverticulosis   . Hyperlipidemia   . Hypertension   . Mold exposure    Family History  Problem Relation Age of Onset  . Cancer Mother    Past Surgical History:  Procedure Laterality Date  . BACK SURGERY    . CERVICAL SPINE SURGERY  2003   fusion  . knees    . SPINE SURGERY     Social History   Occupational History  . Not on file.   Social History Main Topics  . Smoking status: Never Smoker  . Smokeless tobacco: Never Used  . Alcohol use No  . Drug use: No     Comment: Hx marijuana use - none x 2 yrs  . Sexual activity: Yes    OBJECTIVE:  VS:  HT:5\' 11"  (180.3 cm)   WT:208 lb (94.3 kg)  BMI:29.1    BP:(!) 160/86  HR:81bpm  TEMP: ( )  RESP:94 % EXAM: Findings:  WDWN, NAD, Non-toxic appearing Alert & appropriately interactive Not depressed or anxious appearing No increased work of breathing. Pupils are equal. EOM intact without nystagmus No clubbing or cyanosis of the extremities appreciated No significant rashes/lesions/ulcerations overlying the examined area. Radial pulses 2+/4.  No significant generalized UE edema. Sensation intact to light touch in upper extremities.  Shoulder and neck: Limited side bending and rotation of the neck this is only minimally uncomfortable for him.  Strength is 5+/5.  He has no significant  pain with Hawkins or Neer's.  Mild pain with brachial plexus squeeze as well as arm squeeze test.  Upper extremity reflexes symmetric.     No results found. ASSESSMENT & PLAN:   Problem List Items Addressed This Visit    Chronic right shoulder pain    Some underlying scapular dyskinesis in the setting of cervical radiculitis.  Continue with posterior chain exercises and plan to consider titrating off of gabapentin 8 weeks if he is doing well.      Osteoarthritis of spine - Primary     Symptoms have significantly improved. We will continue with gabapentin for additional 8 weeks we will plan to see him back at that time to discuss titrating off this.  He should continue with his therapeutic exercises as previously outlined.         Follow-up: Return in about 8 weeks (around 05/24/2017).   CMA/ATC served as Neurosurgeon during this visit. History, Physical, and Plan performed by medical provider. Documentation and orders reviewed and attested to.      Gaspar Bidding, DO    [facility] Physician

## 2017-04-05 NOTE — Assessment & Plan Note (Signed)
Symptoms have significantly improved. We will continue with gabapentin for additional 8 weeks we will plan to see him back at that time to discuss titrating off this.  He should continue with his therapeutic exercises as previously outlined.

## 2017-04-05 NOTE — Assessment & Plan Note (Signed)
Some underlying scapular dyskinesis in the setting of cervical radiculitis.  Continue with posterior chain exercises and plan to consider titrating off of gabapentin 8 weeks if he is doing well.

## 2017-04-17 ENCOUNTER — Ambulatory Visit: Payer: Medicare HMO | Admitting: Psychology

## 2017-04-18 ENCOUNTER — Ambulatory Visit: Payer: Medicare HMO | Admitting: Psychology

## 2017-05-16 ENCOUNTER — Telehealth: Payer: Self-pay | Admitting: Internal Medicine

## 2017-05-16 NOTE — Telephone Encounter (Signed)
Received referral on 4.19.18 for patient to have next colon at our office. Patient last colon was 2013 with Dr.Hung. Patient not requesting certain doctor, he just wants all [facility] docs. Previous records received and placed on DOD for morning 4.19.18; Dr.Pyrtle's desk for review.

## 2017-05-17 ENCOUNTER — Encounter: Payer: Self-pay | Admitting: Internal Medicine

## 2017-05-17 ENCOUNTER — Ambulatory Visit: Payer: Self-pay | Admitting: Psychology

## 2017-05-17 NOTE — Telephone Encounter (Signed)
Dr.Pyrtle reviewed records and accepted for patient to be scheduled for a direct colonoscopy. Patient has been notified of this and has been scheduled.

## 2017-05-24 ENCOUNTER — Ambulatory Visit: Payer: Self-pay | Admitting: Sports Medicine

## 2017-05-24 ENCOUNTER — Ambulatory Visit (INDEPENDENT_AMBULATORY_CARE_PROVIDER_SITE_OTHER): Payer: Medicare HMO | Admitting: Psychology

## 2017-05-24 DIAGNOSIS — F411 Generalized anxiety disorder: Secondary | ICD-10-CM

## 2017-06-01 ENCOUNTER — Other Ambulatory Visit: Payer: Self-pay

## 2017-06-01 MED ORDER — GABAPENTIN 300 MG PO CAPS: ORAL_CAPSULE | ORAL | 0 refills | Status: DC

## 2017-06-05 ENCOUNTER — Other Ambulatory Visit: Payer: Self-pay | Admitting: Sports Medicine

## 2017-07-04 ENCOUNTER — Ambulatory Visit (AMBULATORY_SURGERY_CENTER): Payer: Self-pay | Admitting: *Deleted

## 2017-07-04 VITALS — Ht 71.0 in | Wt 207.2 lb

## 2017-07-04 DIAGNOSIS — Z800 Family history of malignant neoplasm of digestive organs: Secondary | ICD-10-CM

## 2017-07-04 MED ORDER — NA SULFATE-K SULFATE-MG SULF 17.5-3.13-1.6 GM/177ML PO SOLN: ORAL | 0 refills | Status: DC

## 2017-07-04 NOTE — Progress Notes (Signed)
No allergies to eggs or soy. No problems with anesthesia.  Pt given Emmi instructions for colonoscopy  No oxygen use  No diet drug use  

## 2017-07-05 ENCOUNTER — Encounter: Payer: Self-pay | Admitting: Internal Medicine

## 2017-07-07 ENCOUNTER — Ambulatory Visit: Payer: Medicare HMO | Admitting: Psychology

## 2017-07-10 ENCOUNTER — Telehealth: Payer: Self-pay | Admitting: Internal Medicine

## 2017-07-10 NOTE — Telephone Encounter (Signed)
Prep is $92. Notified patient that Suprep sample kit will be at front desk on 4 th floor for him to pick up. He states he will come on Friday to get this. suprep sample 1 kit Lot# [id number], exp. 6/20, take as directed.

## 2017-07-18 ENCOUNTER — Encounter: Payer: Self-pay | Admitting: Internal Medicine

## 2017-07-18 ENCOUNTER — Ambulatory Visit (AMBULATORY_SURGERY_CENTER): Payer: Medicare HMO | Admitting: Internal Medicine

## 2017-07-18 VITALS — BP 124/69 | HR 61 | Temp 98.0°F | Resp 16 | Ht 71.0 in | Wt 207.0 lb

## 2017-07-18 DIAGNOSIS — I10 Essential (primary) hypertension: Secondary | ICD-10-CM | POA: Diagnosis not present

## 2017-07-18 DIAGNOSIS — Z1211 Encounter for screening for malignant neoplasm of colon: Secondary | ICD-10-CM

## 2017-07-18 DIAGNOSIS — Z1212 Encounter for screening for malignant neoplasm of rectum: Secondary | ICD-10-CM

## 2017-07-18 DIAGNOSIS — Z800 Family history of malignant neoplasm of digestive organs: Secondary | ICD-10-CM | POA: Diagnosis present

## 2017-07-18 DIAGNOSIS — K5730 Diverticulosis of large intestine without perforation or abscess without bleeding: Secondary | ICD-10-CM | POA: Diagnosis not present

## 2017-07-18 MED ORDER — SODIUM CHLORIDE 0.9 % IV SOLN: 500.0000 mL | INTRAVENOUS | Status: DC

## 2017-07-18 NOTE — Patient Instructions (Addendum)
  Handouts given: Diverticulosis and Hemorrhoids.   YOU HAD AN ENDOSCOPIC PROCEDURE TODAY AT THE [facility]:   Refer to the procedure report that was given to you for any specific questions about what was found during the examination.  If the procedure report does not answer your questions, please call your gastroenterologist to clarify.  If you requested that your care partner not be given the details of your procedure findings, then the procedure report has been included in a sealed envelope for you to review at your convenience later.  YOU SHOULD EXPECT: Some feelings of bloating in the abdomen. Passage of more gas than usual.  Walking can help get rid of the air that was put into your GI tract during the procedure and reduce the bloating. If you had a lower endoscopy (such as a colonoscopy or flexible sigmoidoscopy) you may notice spotting of blood in your stool or on the toilet paper. If you underwent a bowel prep for your procedure, you may not have a normal bowel movement for a few days.  Please Note:  You might notice some irritation and congestion in your nose or some drainage.  This is from the oxygen used during your procedure.  There is no need for concern and it should clear up in a day or so.  SYMPTOMS TO REPORT IMMEDIATELY:   Following lower endoscopy (colonoscopy or flexible sigmoidoscopy):  Excessive amounts of blood in the stool  Significant tenderness or worsening of abdominal pains  Swelling of the abdomen that is new, acute  Fever of 100F or higher    For urgent or emergent issues, a gastroenterologist can be reached at any hour by calling [phone].   DIET:  We do recommend a small meal at first, but then you may proceed to your regular diet.  Drink plenty of fluids but you should avoid alcoholic beverages for 24 hours.  ACTIVITY:  You should plan to take it easy for the rest of today and you should NOT DRIVE or use heavy machinery until tomorrow  (because of the sedation medicines used during the test).    FOLLOW UP: Our staff will call the number listed on your records the next business day following your procedure to check on you and address any questions or concerns that you may have regarding the information given to you following your procedure. If we do not reach you, we will leave a message.  However, if you are feeling well and you are not experiencing any problems, there is no need to return our call.  We will assume that you have returned to your regular daily activities without incident.  If any biopsies were taken you will be contacted by phone or by letter within the next 1-3 weeks.  Please call us at [phone] if you have not heard about the biopsies in 3 weeks.    SIGNATURES/CONFIDENTIALITY: You and/or your care partner have signed paperwork which will be entered into your electronic medical record.  These signatures attest to the fact that that the information above on your After Visit Summary has been reviewed and is understood.  Full responsibility of the confidentiality of this discharge information lies with you and/or your care-partner. 

## 2017-07-18 NOTE — Progress Notes (Signed)
Report given to PACU, vss 

## 2017-07-18 NOTE — Op Note (Signed)
[facility] Patient Name: Adrian Cox Procedure Date: 07/18/2017 10:40 AM MRN: [id number] Endoscopist: Beverley Fiedler , MD Age: 56 Referring MD:  Date of Birth: 05-12-1961 Gender: Male Account #: [id number] Procedure:                Colonoscopy Indications:              Screening in patient at increased risk: Family                            history of 1st-degree relative with colorectal                            cancer, Last colonoscopy 5 years ago (with Dr.                            Elnoria Howard, removal of 1 hyperplastic colon polyp) Medicines:                Monitored Anesthesia Care Procedure:                Pre-Anesthesia Assessment:                           - Prior to the procedure, a History and Physical                            was performed, and patient medications and                            allergies were reviewed. The patient's tolerance of                            previous anesthesia was also reviewed. The risks                            and benefits of the procedure and the sedation                            options and risks were discussed with the patient.                            All questions were answered, and informed consent                            was obtained. Prior Anticoagulants: The patient has                            taken no previous anticoagulant or antiplatelet                            agents. ASA Grade Assessment: II - A patient with                            mild systemic disease. After reviewing the risks  and benefits, the patient was deemed in                            satisfactory condition to undergo the procedure.                           After obtaining informed consent, the colonoscope                            was passed under direct vision. Throughout the                            procedure, the patient's blood pressure, pulse, and                            oxygen saturations were  monitored continuously. The                            Colonoscope was introduced through the anus and                            advanced to the the terminal ileum. The colonoscopy                            was performed without difficulty. The patient                            tolerated the procedure well. The quality of the                            bowel preparation was good. The terminal ileum,                            ileocecal valve, appendiceal orifice, and rectum                            were photographed. Scope In: 10:52:05 AM Scope Out: 11:04:07 AM Scope Withdrawal Time: 0 hours 9 minutes 52 seconds  Total Procedure Duration: 0 hours 12 minutes 2 seconds  Findings:                 The digital rectal exam was normal.                           Scattered aphthous appearing erosions in the                            terminal ileum with other ileal mucosa appearing                            normal.                           Multiple small and large-mouthed diverticula were  found in the sigmoid colon, ascending colon and                            cecum.                           Internal hemorrhoids were found during                            retroflexion. The hemorrhoids were small. Complications:            No immediate complications. Estimated Blood Loss:     Estimated blood loss: none. Impression:               - Scattered ileal erosions (query NSAID-related),                            otherwise the examined portion of the ileum was                            normal.                           - Moderate diverticulosis in the sigmoid colon, in                            the ascending colon and in the cecum.                           - Internal hemorrhoids.                           - No specimens collected. Recommendation:           - Patient has a contact number available for                            emergencies. The signs and symptoms  of potential                            delayed complications were discussed with the                            patient. Return to normal activities tomorrow.                            Written discharge instructions were provided to the                            patient.                           - Resume previous diet.                           - Continue present medications.                           -  Repeat colonoscopy in 5 years for screening                            purposes. Beverley Fiedler, MD 07/18/2017 11:09:16 AM This report has been signed electronically.

## 2017-07-19 ENCOUNTER — Telehealth: Payer: Self-pay | Admitting: *Deleted

## 2017-07-19 NOTE — Telephone Encounter (Signed)
  Follow up Call-  Call back number 07/18/2017  Post procedure Call Back phone  # [phone]  Permission to leave phone message Yes  Some recent data might be hidden     Patient questions:  Do you have a fever, pain , or abdominal swelling? No. Pain Score  0 *  Have you tolerated food without any problems? Yes.    Have you been able to return to your normal activities? Yes.    Do you have any questions about your discharge instructions: Diet   No. Medications  No. Follow up visit  No.  Do you have questions or concerns about your Care? No.  Actions: * If pain score is 4 or above: No action needed, pain <4.

## 2017-08-08 ENCOUNTER — Ambulatory Visit: Payer: Self-pay | Admitting: Psychology

## 2017-08-23 ENCOUNTER — Ambulatory Visit: Payer: Self-pay | Admitting: Psychology

## 2017-08-29 ENCOUNTER — Other Ambulatory Visit: Payer: Self-pay | Admitting: Sports Medicine

## 2017-09-07 ENCOUNTER — Other Ambulatory Visit: Payer: Self-pay | Admitting: Sports Medicine

## 2017-10-20 ENCOUNTER — Ambulatory Visit (INDEPENDENT_AMBULATORY_CARE_PROVIDER_SITE_OTHER): Payer: Medicare HMO | Admitting: Psychology

## 2017-10-20 DIAGNOSIS — F411 Generalized anxiety disorder: Secondary | ICD-10-CM

## 2017-10-23 ENCOUNTER — Ambulatory Visit: Payer: Medicare HMO | Admitting: Sports Medicine

## 2017-10-23 DIAGNOSIS — Z0289 Encounter for other administrative examinations: Secondary | ICD-10-CM

## 2017-10-24 ENCOUNTER — Ambulatory Visit: Payer: Self-pay | Admitting: Family Medicine

## 2017-10-27 DIAGNOSIS — H524 Presbyopia: Secondary | ICD-10-CM | POA: Diagnosis not present

## 2017-10-30 ENCOUNTER — Other Ambulatory Visit: Payer: Self-pay

## 2017-10-30 ENCOUNTER — Emergency Department (HOSPITAL_COMMUNITY)
Admission: EM | Admit: 2017-10-30 | Discharge: 2017-10-30 | Disposition: A | Discharge status: Home / Self Care | Payer: Medicare HMO | Attending: Emergency Medicine | Admitting: Emergency Medicine

## 2017-10-30 ENCOUNTER — Encounter (HOSPITAL_COMMUNITY): Payer: Self-pay

## 2017-10-30 DIAGNOSIS — G8929 Other chronic pain: Secondary | ICD-10-CM

## 2017-10-30 DIAGNOSIS — M5440 Lumbago with sciatica, unspecified side: Secondary | ICD-10-CM | POA: Diagnosis not present

## 2017-10-30 DIAGNOSIS — Z79899 Other long term (current) drug therapy: Secondary | ICD-10-CM | POA: Insufficient documentation

## 2017-10-30 DIAGNOSIS — Z7982 Long term (current) use of aspirin: Secondary | ICD-10-CM | POA: Diagnosis not present

## 2017-10-30 DIAGNOSIS — M549 Dorsalgia, unspecified: Secondary | ICD-10-CM | POA: Diagnosis present

## 2017-10-30 DIAGNOSIS — I10 Essential (primary) hypertension: Secondary | ICD-10-CM | POA: Diagnosis not present

## 2017-10-30 DIAGNOSIS — M545 Low back pain: Secondary | ICD-10-CM

## 2017-10-30 DIAGNOSIS — M5442 Lumbago with sciatica, left side: Secondary | ICD-10-CM | POA: Diagnosis not present

## 2017-10-30 MED ORDER — METHOCARBAMOL 500 MG PO TABS: 500.0000 mg | ORAL_TABLET | Freq: Two times a day (BID) | ORAL | 0 refills | Status: DC | PRN

## 2017-10-30 MED ORDER — KETOROLAC TROMETHAMINE 60 MG/2ML IM SOLN
30.0000 mg | Freq: Once | INTRAMUSCULAR | Status: AC
  Administered 2017-10-30: 30 mg via INTRAMUSCULAR
  Filled 2017-10-30: qty 2

## 2017-10-30 MED ORDER — METHYLPREDNISOLONE 4 MG PO TBPK: ORAL_TABLET | ORAL | 0 refills | Status: DC

## 2017-10-30 NOTE — ED Provider Notes (Signed)
[facility] EMERGENCY DEPARTMENT Provider Note   CSN: [id number] Arrival date & time: 10/30/17  1705     History   Chief Complaint Chief Complaint  Patient presents with  . Back Pain    HPI Adrian Cox is a 57 y.o. male.  The history is provided by the patient and medical records. No language interpreter was used.  Back Pain   Pertinent negatives include no chest pain, no fever, no numbness, no abdominal pain, no dysuria and no weakness.   Adrian Cox is a 57 y.o. male  with a PMH of HTN, [name], bipolar disporder who presents to the Emergency Department complaining of left-sided aching back pain intermittently over the last 4-5 months. Pain will occasionally radiate down left leg to the knee. Never has had radiation go down to the foot. Patient just started a job at [employer] a few weeks ago and feels as if the prolonged standing and/or cold has worsened his back pain. He has been taking gabapentin for his chronic shoulder pain but that does not seem to be improving his back pain. No other medications or treatments prior to arrival for symptoms. Patient denies upper back or neck pain. No fever, saddle anesthesia, weakness, numbness, urinary complaints including retention/incontinence. No history of cancer, IVDU, or recent spinal procedures. He has a physical coming up on January 29th.   Past Medical History:  Diagnosis Date  . Depression    bipolar  . Diverticulosis   . Hyperlipidemia   . Hypertension   . Mold exposure     Patient Active Problem List   Diagnosis Date Noted  . Osteoarthritis of spine 02/23/2017  . Chronic right shoulder pain 01/09/2017  . Essential hypertension 08/17/2016  . Bipolar disorder (HCC) 08/17/2016  . Preventative health care 08/17/2016  . Hyperlipidemia 08/17/2016    Past Surgical History:  Procedure Laterality Date  . BACK SURGERY    . CERVICAL SPINE SURGERY  2003   fusion  . KNEE ARTHROSCOPY Right   . KNEE ARTHROSCOPY  Left   . SPINE SURGERY         Home Medications    Prior to Admission medications   Medication Sig Start Date End Date Taking? Authorizing Provider  aspirin EC 81 MG tablet Take 81 mg by mouth daily.    [provider]  cholecalciferol (VITAMIN D) 1000 units tablet Take 1,000 Units by mouth daily.    [provider]  FIBER PO Take 4 tablets by mouth daily.    [provider]  gabapentin (NEURONTIN) 300 MG capsule TAKE 1 CAPSULE THREE TIMES DAILY AS NEEDED (ov before next refill) 09/11/17   Andrena Mews, DO  methocarbamol (ROBAXIN) 500 MG tablet Take 1 tablet (500 mg total) by mouth 2 (two) times daily as needed. 10/30/17   Ollivander See, Chase Picket, PA-C  Multiple Vitamin (MULTIVITAMIN WITH MINERALS) TABS tablet Take 1 tablet by mouth daily.    [provider]  omega-3 acid ethyl esters (LOVAZA) 1 g capsule Take 2 g by mouth daily.    [provider]    Family History Family History  Problem Relation Age of Onset  . Cancer Mother   . Colon cancer Mother 38    Social History Social History   Tobacco Use  . Smoking status: Never Smoker  . Smokeless tobacco: Never Used  Substance Use Topics  . Alcohol use: No  . Drug use: No    Comment: Hx marijuana use - none  x 2 yrs     Allergies   Patient has no known allergies.   Review of Systems Review of Systems  Constitutional: Negative for chills and fever.  Respiratory: Negative for shortness of breath.   Cardiovascular: Negative for chest pain.  Gastrointestinal: Negative for abdominal pain, nausea and vomiting.  Genitourinary: Negative for difficulty urinating and dysuria.  Musculoskeletal: Positive for back pain.  Skin: Negative for color change and wound.  Neurological: Negative for weakness and numbness.     Physical Exam Updated Vital Signs BP (!) 165/88 (BP Location: Right Arm)   Pulse 86   Resp 20   Ht 5\' 11"  (1.803 m)   Wt 92.5 kg (204 lb)   SpO2 95%   BMI 28.45  kg/m   Physical Exam  Constitutional: He is oriented to person, place, and time. He appears well-developed and well-nourished.  Neck:  No midline or paraspinal tenderness. Full ROM without pain.  Cardiovascular: Normal rate, regular rhythm, normal heart sounds and intact distal pulses.  Pulmonary/Chest: Effort normal and breath sounds normal. No respiratory distress.  Abdominal: Soft. Bowel sounds are normal. He exhibits no distension. There is no tenderness.  Musculoskeletal:       Back:  No midline T/L tenderness. Tenderness to palpation as depicted in image. 5/5 muscle strength in bilateral LE's. Straight leg raises are negative bilaterally for radicular symptoms but left SLR does reproduce low back pain. Able to ambulate independently with steady gait.  Neurological: He is alert and oriented to person, place, and time. He has normal reflexes.  Bilateral lower extremities neurovascularly intact.  Skin: Skin is warm and dry. No rash noted. No erythema.  Nursing note and vitals reviewed.    ED Treatments / Results  Labs (all labs ordered are listed, but only abnormal results are displayed) Labs Reviewed - No data to display  EKG  EKG Interpretation None       Radiology No results found.  Procedures Procedures (including critical care time)  Medications Ordered in ED Medications  ketorolac (TORADOL) injection 30 mg (30 mg Intramuscular Given 10/30/17 1924)     Initial Impression / Assessment and Plan / ED Course  I have reviewed the triage vital signs and the nursing notes.  Pertinent labs & imaging results that were available during my care of the patient were reviewed by me and considered in my medical decision making (see chart for details).    Adrian Cox is a 57 y.o. male who presents to ED for several months of left-sided back pain. Hx of similar and told it could be sciatica or fibromyalgia.   Patient demonstrates no lower extremity weakness, saddle  anesthesia, bowel or bladder incontinence or neuro deficits. No concern for cauda equina. No fevers or other infectious symptoms to suggest that the patient's back pain is due to an infection. Lower extremities are neurovascularly intact and patient is ambulating without difficulty. I have reviewed return precautions, including the development of any of these signs or symptoms and the patient has voiced understanding. I reviewed symptomatic home care instructions and PCP follow-up if symptoms do not improve. He has physical scheduled with PCP on 1/29 and was encouraged to keep this appointment. RX for robaxin given. Toradol given in ED. Patient voiced understanding and agreement with plan. All questions answered.   Final Clinical Impressions(s) / ED Diagnoses   Final diagnoses:  Chronic left-sided low back pain, with sciatica presence unspecified    ED Discharge Orders  Ordered    methylPREDNISolone (MEDROL DOSEPAK) 4 MG TBPK tablet  Status:  Discontinued     10/30/17 1915    methocarbamol (ROBAXIN) 500 MG tablet  2 times daily PRN     10/30/17 1919       Lan Entsminger, Chase Picket, PA-C 10/30/17 1928    Jacalyn Lefevre, MD 10/30/17 1948

## 2017-10-30 NOTE — Discharge Instructions (Signed)
It was my pleasure taking care of you today!   You have been seen in the Emergency Department today for back pain.   Take muscle relaxer (robaxin) twice daily and 500mg  Tylenol as needed for pain. In addition to this, use ice and/or heat for additional pain relief.  Your back pain should get better over the next 2 weeks. Please follow up with your doctor this week for a recheck if still having symptoms.  COLD THERAPY DIRECTIONS:  Ice or gel packs can be used to reduce both pain and swelling. Ice is the most helpful within the first 24 to 48 hours after an injury or flareup from overusing a muscle or joint.  Ice is effective, has very few side effects, and is safe for most people to use.    Return to the ED for worsening back pain, fever, weakness or numbness of either leg, or if you develop either (1) an inability to urinate or have bowel movements, or (2) loss of your ability to control your bathroom functions (if you start having "accidents"), or if you develop other new symptoms that concern you.

## 2017-10-30 NOTE — ED Notes (Signed)
Declined W/C at D/C and was escorted to lobby by RN. 

## 2017-10-30 NOTE — ED Triage Notes (Addendum)
Pt reports pain in his lower back that radiates down his left leg. Pt reports pain has been ongoing for months. Pt ambulatory. Denies any bowel or bladder changes.

## 2017-11-01 ENCOUNTER — Telehealth: Payer: Self-pay | Admitting: Family Medicine

## 2017-11-01 NOTE — Telephone Encounter (Signed)
Patient needs a  f/u for Sciatica and I don't see an appt I can get him in soon but a first slot of the day and we were told not to do that. Please advise.

## 2017-11-03 ENCOUNTER — Ambulatory Visit: Payer: Self-pay | Admitting: Family Medicine

## 2017-11-08 NOTE — Telephone Encounter (Signed)
Pt was set up for 1/17 but canceled and is rescheduled for 11/09/17. Pt is aware.

## 2017-11-09 ENCOUNTER — Ambulatory Visit (INDEPENDENT_AMBULATORY_CARE_PROVIDER_SITE_OTHER)
Admission: RE | Admit: 2017-11-09 | Discharge: 2017-11-09 | Disposition: A | Discharge status: Home / Self Care | Payer: Medicare HMO | Source: Ambulatory Visit | Attending: Family Medicine | Admitting: Family Medicine

## 2017-11-09 ENCOUNTER — Other Ambulatory Visit: Payer: Self-pay

## 2017-11-09 ENCOUNTER — Encounter: Payer: Self-pay | Admitting: Family Medicine

## 2017-11-09 ENCOUNTER — Ambulatory Visit (INDEPENDENT_AMBULATORY_CARE_PROVIDER_SITE_OTHER): Payer: Medicare HMO | Admitting: Family Medicine

## 2017-11-09 DIAGNOSIS — G8929 Other chronic pain: Secondary | ICD-10-CM

## 2017-11-09 DIAGNOSIS — M545 Low back pain, unspecified: Secondary | ICD-10-CM

## 2017-11-09 DIAGNOSIS — M5440 Lumbago with sciatica, unspecified side: Secondary | ICD-10-CM

## 2017-11-09 MED ORDER — PREDNISONE 20 MG PO TABS
ORAL_TABLET | ORAL | 0 refills | Status: DC
Start: 2017-11-09 — End: 2017-12-15

## 2017-11-09 MED ORDER — OXYCODONE HCL 5 MG PO CAPS: 5.0000 mg | ORAL_CAPSULE | Freq: Three times a day (TID) | ORAL | 0 refills | Status: DC | PRN

## 2017-11-09 NOTE — Addendum Note (Signed)
Addended by: Alvina ChouWALSH, Jakarie Pember J on: 11/09/2017 10:38 AM   Modules accepted: Orders

## 2017-11-09 NOTE — Progress Notes (Signed)
   Subjective:    Patient ID: Adrian Cox, male    DOB: August 15, 1961, 57 y.o.   MRN: [id number]  HPI    57 year old male presents for ER follow up for chronic left sided back pain   Seen in ER on 10/30/2016   Pain started 4-5 months ago.  Left low back pain occ radiating to left leg.  Cannot get comfortable  Worse in last few months significantly worse.  Worse with prolonged standing  No fall, cannot play golf because of this Works at [employer] Not heavy lifting.    Occ numbness in left leg.  Left leg feels some weaker, occ gives out.  No groin numbness.   Treated with robaxin and toradol in ED. Helped some for a few days.  He is using tylenol over the counter He has been using gabapentin 300 mg TID for chronic shoulder pain and OA of  C-spine followed by Dr. Berline Choughigby.   Dr.  Renae FicklePaul, [facility] treated bulging disc in 90s.     2008 X-ray of lumbar spine Impression: 1. No evidence of spondylolysis or spondylolisthesis.  2. Mild Bilateral facet arthropathy at L4-L5 and L5-S1 is similar to prior.   Review of Systems  Constitutional: Negative for fatigue and fever.  HENT: Negative for ear pain.   Eyes: Negative for pain.  Respiratory: Negative for cough and shortness of breath.   Cardiovascular: Negative for chest pain, palpitations and leg swelling.  Gastrointestinal: Negative for abdominal pain.  Genitourinary: Negative for dysuria.  Musculoskeletal: Positive for back pain and gait problem. Negative for arthralgias.  Neurological: Negative for syncope, light-headedness and headaches.  Psychiatric/Behavioral: Negative for dysphoric mood.       Objective:   Physical Exam  Constitutional: Vital signs are normal. He appears well-developed and well-nourished. He appears distressed.  unable to get comfortable sitting, has to stand  HENT:  Head: Normocephalic.  Right Ear: Hearing normal.  Left Ear: Hearing normal.  Nose: Nose normal.  Mouth/Throat: Oropharynx is clear and  moist and mucous membranes are normal.  Neck: Trachea normal. Carotid bruit is not present. No thyroid mass and no thyromegaly present.  Cardiovascular: Normal rate, regular rhythm and normal pulses. Exam reveals no gallop, no distant heart sounds and no friction rub.  No murmur heard. No peripheral edema  Pulmonary/Chest: Effort normal and breath sounds normal. No respiratory distress.  Musculoskeletal:       Lumbar back: He exhibits decreased range of motion, tenderness and bony tenderness.  ttp in left sciatic notch, SLR positive  Neurological: He displays no atrophy. No sensory deficit. He exhibits abnormal muscle tone.   Tingling in left leg, decreased strength in fott flexion  Skin: Skin is warm, dry and intact. No rash noted.  Psychiatric: He has a normal mood and affect. His speech is normal and behavior is normal. Thought content normal.          Assessment & Plan:

## 2017-11-09 NOTE — Patient Instructions (Addendum)
We will call with  X-ray results.  Start prednisone taper. Continue low back stretches.  Use narcotic pain med for severe breakthrough pain.  Keep follow up as scheduled.

## 2017-11-09 NOTE — Assessment & Plan Note (Addendum)
Send for X-ray. Start pred taper and home PT. Treat breakthrough pain with oxycodone short term as SE to acetaminophen  in past and  Will be on prednisone.  Tramadol not effective in past.  [facility]CRS shows no red flags for pt.. Last narcotic prescription 2016  Send in 5 day supply and send for UDS and pain management contract. Already on fairly high dose gabapentin..  May need to try higher dose for control of chronic pain.  Close follow up.

## 2017-11-12 LAB — PAIN MGMT, PROFILE 8 W/CONF, U
6 ACETYLMORPHINE: NEGATIVE ng/mL (ref <10)
ALCOHOL METABOLITES: NEGATIVE ng/mL (ref <500)
AMPHETAMINES: NEGATIVE ng/mL (ref <500)
BENZODIAZEPINES: NEGATIVE ng/mL (ref <100)
Buprenorphine, Urine: NEGATIVE ng/mL (ref <5)
Cocaine Metabolite: NEGATIVE ng/mL (ref <150)
Creatinine: 306 mg/dL
MARIJUANA METABOLITE: POSITIVE ng/mL — AB (ref <20)
MDMA: NEGATIVE ng/mL (ref <500)
Marijuana Metabolite: 346 ng/mL — ABNORMAL HIGH (ref <5)
OPIATES: NEGATIVE ng/mL (ref <100)
OXIDANT: NEGATIVE ug/mL (ref <200)
Oxycodone: NEGATIVE ng/mL (ref <100)
pH: 7.01 (ref 4.5–9.0)

## 2017-11-14 ENCOUNTER — Ambulatory Visit (INDEPENDENT_AMBULATORY_CARE_PROVIDER_SITE_OTHER): Payer: Medicare HMO | Admitting: Family Medicine

## 2017-11-14 ENCOUNTER — Encounter: Payer: Self-pay | Admitting: Family Medicine

## 2017-11-14 ENCOUNTER — Other Ambulatory Visit: Payer: Self-pay

## 2017-11-14 VITALS — BP 171/97 | HR 71 | Temp 97.7°F | Ht 71.5 in | Wt 201.5 lb

## 2017-11-14 DIAGNOSIS — Z125 Encounter for screening for malignant neoplasm of prostate: Secondary | ICD-10-CM | POA: Diagnosis not present

## 2017-11-14 DIAGNOSIS — Z0000 Encounter for general adult medical examination without abnormal findings: Secondary | ICD-10-CM

## 2017-11-14 DIAGNOSIS — Z23 Encounter for immunization: Secondary | ICD-10-CM

## 2017-11-14 DIAGNOSIS — E785 Hyperlipidemia, unspecified: Secondary | ICD-10-CM

## 2017-11-14 DIAGNOSIS — F1290 Cannabis use, unspecified, uncomplicated: Secondary | ICD-10-CM | POA: Insufficient documentation

## 2017-11-14 DIAGNOSIS — M545 Low back pain: Secondary | ICD-10-CM | POA: Diagnosis not present

## 2017-11-14 DIAGNOSIS — G8929 Other chronic pain: Secondary | ICD-10-CM | POA: Diagnosis not present

## 2017-11-14 DIAGNOSIS — I10 Essential (primary) hypertension: Secondary | ICD-10-CM | POA: Diagnosis not present

## 2017-11-14 DIAGNOSIS — F1291 Cannabis use, unspecified, in remission: Secondary | ICD-10-CM | POA: Insufficient documentation

## 2017-11-14 DIAGNOSIS — F1210 Cannabis abuse, uncomplicated: Secondary | ICD-10-CM | POA: Diagnosis not present

## 2017-11-14 LAB — COMPREHENSIVE METABOLIC PANEL
ALK PHOS: 65 U/L (ref 39–117)
ALT: 21 U/L (ref 0–53)
AST: 16 U/L (ref 0–37)
Albumin: 4.4 g/dL (ref 3.5–5.2)
BUN: 12 mg/dL (ref 6–23)
CHLORIDE: 104 meq/L (ref 96–112)
CO2: 34 mEq/L — ABNORMAL HIGH (ref 19–32)
Calcium: 9.4 mg/dL (ref 8.4–10.5)
Creatinine, Ser: 0.78 mg/dL (ref 0.40–1.50)
GFR: 131.9 mL/min (ref >60.00)
GLUCOSE: 101 mg/dL — AB (ref 70–99)
POTASSIUM: 4.6 meq/L (ref 3.5–5.1)
SODIUM: 142 meq/L (ref 135–145)
Total Bilirubin: 0.5 mg/dL (ref 0.2–1.2)
Total Protein: 7.8 g/dL (ref 6.0–8.3)

## 2017-11-14 LAB — LIPID PANEL
Cholesterol: 182 mg/dL (ref 0–200)
HDL: 39.6 mg/dL (ref >39.00)
LDL CALC: 125 mg/dL — AB (ref 0–99)
NONHDL: 142.59
Total CHOL/HDL Ratio: 5
Triglycerides: 90 mg/dL (ref 0.0–149.0)
VLDL: 18 mg/dL (ref 0.0–40.0)

## 2017-11-14 LAB — PSA, MEDICARE: PSA: 1.63 ng/ml (ref 0.10–4.00)

## 2017-11-14 MED ORDER — DICLOFENAC SODIUM 75 MG PO TBEC: 75.0000 mg | DELAYED_RELEASE_TABLET | Freq: Two times a day (BID) | ORAL | 0 refills | Status: DC

## 2017-11-14 NOTE — Assessment & Plan Note (Signed)
Recommended cessation. Discussed risks.

## 2017-11-14 NOTE — Progress Notes (Signed)
Subjective:    Patient ID: Adrian Cox, male    DOB: [birth year], 57 y.o.   MRN: [id number]  HPI  The patient presents for annual medicare wellness, complete physical and review of chronic health problems. He/She also has the following acute concerns today: ACUTE FLARE OF CHRONIC LOW BACK PAIN  I have personally reviewed the Medicare Annual Wellness questionnaire and have noted 1. The patient's medical and social history 2. Their use of alcohol, tobacco or illicit drugs 3. Their current medications and supplements 4. The patient's functional ability including ADL's, fall risks, home safety risks and hearing or visual             impairment. 5. Diet and physical activities 6. Evidence for depression or mood disorders 7.         Updated provider list Cognitive evaluation was performed and recorded on pt medicare questionnaire form. The patients weight, height, BMI and visual acuity have been recorded in the chart  I have made referrals, counseling and provided education to the patient based review of the above and I have provided the pt with a written personalized care plan for preventive services.   Documentation of this information was scanned into the electronic record under the media tab.    Acute flare of  Chronic low back pain... Left radiculopathy    > 6 weeks  Per pt prednisone helped some with back pain initially but still flaring up On day 5 of 6.  Oxycodone helps some but temporarily.  No nausea. No perineal numbness,   His chronic shoulder pain feels better.  Feels PT in past.  On gabapentin.  Discussed UDS with pt.. Positive for marijuana.. Encouraged to stop.  He has trouble going to work at [employer]... Pain with prolongued standing siting and twisting of low back..   1/24 X-ray lumbar spine:  Degenerative changes L4-L5 and L5-S1. 5 mm anterolisthesis L4 on L5.  Hypertension:   Poor control but pt in pain.  Using medication without problems or lightheadedness:   none Chest pain with exertion:none Edema:none Short of breath:none Average home BPs: 130/70s Other issues:  Elevated Cholesterol:  Due for re-eval. Lab Results  Component Value Date   CHOL 181 11/17/2016   HDL 32.50 (L) 11/17/2016   LDLCALC 131 (H) 11/17/2016   LDLDIRECT 98.0 08/17/2016   TRIG 87.0 11/17/2016   CHOLHDL 6 11/17/2016   Using medications without problems: Muscle aches:  Diet compliance: good Exercise:  [employer] and golf when no back pain.Marland Kitchen. daily Other complaints:  Body mass index is 27.71 kg/m.  Wt Readings from Last 3 Encounters:  11/14/17 201 lb 8 oz (91.4 kg)  11/09/17 203 lb (92.1 kg)  10/30/17 204 lb (92.5 kg)     Social History /Family History/Past Medical History reviewed in detail and updated in EMR if needed. Blood pressure (!) 171/97, pulse 71, temperature 97.7 F (36.5 C), temperature source Oral, height 5' 11.5" (1.816 m), weight 201 lb 8 oz (91.4 kg).   Advance directives and end of life planning reviewed in detail with patient and documented in EMR. Patient given handout on advance care directives if needed. HCPOA and living will updated if needed.  Fall Risk  11/14/2017 01/03/2017  Falls in the past year? No No   Dep[facility] screen [facility] 2[facility] 11/14/2017 01/03/2017 04/20/2015  Decreased Interest 0 0 0  Down, Depressed, Hopeless 1 0 0  PHQ - 2 Score 1 0 0    Review of Systems  Constitutional: Negative for  fatigue and fever.  HENT: Negative for ear pain.   Eyes: Negative for pain.  Respiratory: Negative for cough and shortness of breath.   Cardiovascular: Negative for chest pain, palpitations and leg swelling.  Gastrointestinal: Negative for abdominal pain.  Genitourinary: Negative for dysuria.  Musculoskeletal: Negative for arthralgias.  Neurological: Negative for syncope, light-headedness and headaches.  Psychiatric/Behavioral: Negative for dysphoric mood.       Objective:   Physical Exam  Constitutional: Vital signs are normal. He appears  well-developed and well-nourished.  Non-toxic appearance. He does not appear ill. No distress.  unable to get comfortable sitting, has to stand  HENT:  Head: Normocephalic and atraumatic.  Right Ear: Hearing, tympanic membrane, external ear and ear canal normal.  Left Ear: Hearing, tympanic membrane, external ear and ear canal normal.  Nose: Nose normal.  Mouth/Throat: Uvula is midline, oropharynx is clear and moist and mucous membranes are normal.  Eyes: Conjunctivae, EOM and lids are normal. Pupils are equal, round, and reactive to light. Lids are everted and swept, no foreign bodies found.  Neck: Trachea normal, normal range of motion and phonation normal. Neck supple. Carotid bruit is not present. No thyroid mass and no thyromegaly present.  Cardiovascular: Normal rate, regular rhythm, S1 normal, S2 normal, intact distal pulses and normal pulses. Exam reveals no gallop, no distant heart sounds and no friction rub.  No murmur heard. No peripheral edema  Pulmonary/Chest: Effort normal and breath sounds normal. No respiratory distress. He has no wheezes. He has no rhonchi. He has no rales.  Abdominal: Soft. Normal appearance and bowel sounds are normal. There is no hepatosplenomegaly. There is no tenderness. There is no rebound, no guarding and no CVA tenderness. No hernia.  Musculoskeletal:       Lumbar back: He exhibits decreased range of motion, tenderness and bony tenderness.  ttp in left sciatic notch, SLR positive  Lymphadenopathy:    He has no cervical adenopathy.  Neurological: He is alert. He has normal strength and normal reflexes. He displays no atrophy. No cranial nerve deficit or sensory deficit. He exhibits abnormal muscle tone. Gait normal.   Tingling in left leg, decreased strength in fott flexion  Skin: Skin is warm, dry and intact. No rash noted.  Psychiatric: He has a normal mood and affect. His speech is normal and behavior is normal. Judgment and thought content normal.            Assessment & Plan:  The patient's preventative maintenance and recommended screening tests for an annual wellness exam were reviewed in full today. Brought up to date unless services declined.  Counselled on the importance of diet, exercise, and its role in overall health and mortality. The patient's FH and SH was reviewed, including their home life, tobacco status, and drug and alcohol status.    Vaccines: Uptodate with flu per pt  Tdap due..  Given Prostate Cancer Screen: Due  Colon Cancer Screen:  07/2017  Dr. Rhea Belton.. Repeat in 10 years.      Smoking Status:none ETOH/ drug ZOX:WRUE/[id number].. Discussed  Associated health risk  Hep C: done   reports that  has never smoked. he has never used smokeless tobacco. He reports that he uses drugs. Drug: Marijuana. He reports that he does not drink alcohol.  Marland Kitchen

## 2017-11-14 NOTE — Assessment & Plan Note (Signed)
Elevated due to pain.. Per pt before back pain flare BP was at goal at home. Encouraged exercise, weight loss, healthy eating habits.

## 2017-11-14 NOTE — Assessment & Plan Note (Signed)
Refer to PT. Start NSAID... Diclofenac.  Send for MRI eval.. May need referral to back specialist.  Limitations for work.

## 2017-11-14 NOTE — Assessment & Plan Note (Signed)
Due for re-eval. 

## 2017-11-14 NOTE — Patient Instructions (Addendum)
Please stop at the front desk to set up referral to PT and for MRI L spine  Please stop at the lab to have labs drawn.  Follow BP at home.. Goal BP < 140/90  Complete prednisone taper. Start diclofenac twice daily for pain and inflammation.  We will call with results.

## 2017-11-15 ENCOUNTER — Encounter: Payer: Self-pay | Admitting: Family Medicine

## 2017-11-15 ENCOUNTER — Encounter: Payer: Self-pay | Admitting: *Deleted

## 2017-11-17 ENCOUNTER — Encounter: Payer: Self-pay | Admitting: Sports Medicine

## 2017-11-17 ENCOUNTER — Telehealth: Payer: Self-pay | Admitting: Family Medicine

## 2017-11-17 NOTE — Telephone Encounter (Signed)
Agreed -

## 2017-11-17 NOTE — Telephone Encounter (Signed)
I spoke with [name] at [facility]; pt has appt on 11/20/17 at 7 AM for MRI lumbar spine with contrast. Thereasa Distanceodney said needs to change order;if pt needs contrast order should be MRI with or w/o contrast and if pt does not need contrast should read MRI w/o contrast. If possible please change order rather than putting in new order.

## 2017-11-17 NOTE — Telephone Encounter (Signed)
Copied from CRM [id number]. Topic: Quick Communication - See Telephone Encounter >> Nov 17, 2017  9:09 AM Guinevere FerrariMorris, Emaline Karnes E, NT wrote: CRM for notification. See Telephone encounter for: [name] is calling to see if the doctor or nurse could give him a call back regarding the that was received for the patient to have an MRI. Pls call back [phone]  11/17/17.

## 2017-11-17 NOTE — Addendum Note (Signed)
Addended by: Damita LackLORING, Guiselle Mian S on: 11/17/2017 12:02 PM   Modules accepted: Orders

## 2017-11-17 NOTE — Telephone Encounter (Signed)
New order for MRI Lumbar Spine w/wo contrast ordered.  I don't know of a way to change previous order.  Unable to cancel previous order due to appointment is linked to that order.

## 2017-11-20 ENCOUNTER — Telehealth: Payer: Self-pay | Admitting: Family Medicine

## 2017-11-20 ENCOUNTER — Ambulatory Visit
Admission: RE | Admit: 2017-11-20 | Discharge: 2017-11-20 | Disposition: A | Discharge status: Home / Self Care | Payer: Medicare HMO | Source: Ambulatory Visit | Attending: Family Medicine | Admitting: Family Medicine

## 2017-11-20 DIAGNOSIS — M545 Low back pain, unspecified: Secondary | ICD-10-CM

## 2017-11-20 DIAGNOSIS — M48061 Spinal stenosis, lumbar region without neurogenic claudication: Secondary | ICD-10-CM | POA: Diagnosis not present

## 2017-11-20 DIAGNOSIS — G8929 Other chronic pain: Secondary | ICD-10-CM

## 2017-11-20 MED ORDER — OXYCODONE HCL 5 MG PO CAPS: 5.0000 mg | ORAL_CAPSULE | Freq: Three times a day (TID) | ORAL | 0 refills | Status: DC | PRN

## 2017-11-20 MED ORDER — GADOBENATE DIMEGLUMINE 529 MG/ML IV SOLN
20.0000 mL | Freq: Once | INTRAVENOUS | Status: AC | PRN
  Administered 2017-11-20: 19 mL via INTRAVENOUS

## 2017-11-20 NOTE — Telephone Encounter (Signed)
MRI of Lumbar Spine w/Contrast cancelled.

## 2017-11-20 NOTE — Telephone Encounter (Signed)
Lupita LeashDonna, MRI w contrast has been detached from the appt scheduled for later today. Can you please cancel the W contrast order?

## 2017-11-20 NOTE — Addendum Note (Signed)
Addended by: Damita LackLORING, Chariah Bailey S on: 11/20/2017 10:08 AM   Modules accepted: Orders

## 2017-11-21 ENCOUNTER — Telehealth: Payer: Self-pay | Admitting: Family Medicine

## 2017-11-21 DIAGNOSIS — G8929 Other chronic pain: Secondary | ICD-10-CM

## 2017-11-21 DIAGNOSIS — M545 Low back pain: Principal | ICD-10-CM

## 2017-11-21 NOTE — Telephone Encounter (Signed)
Referral made 

## 2017-11-23 ENCOUNTER — Ambulatory Visit: Payer: Self-pay | Admitting: Psychology

## 2017-12-04 ENCOUNTER — Encounter: Payer: Self-pay | Admitting: Family Medicine

## 2017-12-04 ENCOUNTER — Ambulatory Visit: Payer: Medicare HMO | Admitting: Sports Medicine

## 2017-12-04 ENCOUNTER — Other Ambulatory Visit: Payer: Self-pay | Admitting: Family Medicine

## 2017-12-04 NOTE — Telephone Encounter (Signed)
Pt stopped by requesting prescription for pain. I advised him to fill out triage form and placed it in rx tower.

## 2017-12-04 NOTE — Telephone Encounter (Signed)
Pt made a comment about wanting a gun to kill himself because the pain was so bad. Informed Artelia LarocheRena so she can call him.

## 2017-12-04 NOTE — Telephone Encounter (Addendum)
Was given note that pt having consistent breakthrough pain; when sitting or prolong standing pain is untolerable. Pt request med for pain; then Irving Burtonmily comes to me with triage note and said pt has already gone but told Irving Burtonmily he wanted to get a gun to kill himself because the pain was so bad.  When I called pt he was in examing room with his father in law to see Dr Patsy Lageropland; I went to talk with pt in another exam room. Pt said had low back pain that goes down lt leg for "awhile" pt is out of oxy and [facility] Oxycodone 5 mg last refilled # 15 on 11/20/17; pt last seen annual on 11/14/17. Pt is out of pain med. Will send request to Dr Ermalene SearingBedsole per Lupita Leashonna CMA. Pt has appt for epidural on 12/11/17. Pt said he was just joking about getting a gun and killing himself. I advised pt he should not joke about harming himself because we have to take statements like that very seriously. Pt assured me he is hurting badly but he is not going to harm himself or anyone else. Advised Dr Ermalene SearingBedsole out of office today but will return on 12/05/17. If pt condition changes or worsens prior to cb from Dr [facility] Bro[facility] ArrowBedsole's office pt will go to ED.

## 2017-12-05 ENCOUNTER — Other Ambulatory Visit: Payer: Self-pay | Admitting: Family Medicine

## 2017-12-05 ENCOUNTER — Ambulatory Visit: Payer: Medicare HMO | Attending: Family Medicine | Admitting: Physical Therapy

## 2017-12-05 ENCOUNTER — Other Ambulatory Visit: Payer: Self-pay

## 2017-12-05 ENCOUNTER — Encounter: Payer: Self-pay | Admitting: Physical Therapy

## 2017-12-05 DIAGNOSIS — M62830 Muscle spasm of back: Secondary | ICD-10-CM | POA: Diagnosis not present

## 2017-12-05 DIAGNOSIS — M5440 Lumbago with sciatica, unspecified side: Secondary | ICD-10-CM | POA: Diagnosis not present

## 2017-12-05 DIAGNOSIS — R262 Difficulty in walking, not elsewhere classified: Secondary | ICD-10-CM | POA: Insufficient documentation

## 2017-12-05 MED ORDER — OXYCODONE HCL 5 MG PO CAPS: 5.0000 mg | ORAL_CAPSULE | Freq: Three times a day (TID) | ORAL | 0 refills | Status: DC | PRN

## 2017-12-05 NOTE — Telephone Encounter (Signed)
Reviewed notes.  Refilled pain medication .Marland Kitchen. Pt needs to keep appt for epidural and follow up with PMR if pain not controlled afterward.

## 2017-12-05 NOTE — Telephone Encounter (Signed)
Copied from CRM [id number]. Topic: General - Other >> Dec 05, 2017  2:49 PM Raquel SarnaHayes, Teresa G wrote: Oxycodone 5 mg  Pt is in severe pain after physical therapy.  Pt is needing a refill for Rx asap.  [facility] [address] [facility] [place], [place] - [address] [place] [address] Phone: [phone] Fax: [phone]

## 2017-12-05 NOTE — Telephone Encounter (Signed)
Copied from CRM #[id number]. >> Dec 05, 2017  2:49 PM Raquel SarnaHayes, Teresa G wrote: Oxycodone 5 mg  Pt is in severe pain after physical therapy.  Pt is needing a refill for Rx asap.  [facility] [address] [facility] [place], [place] - [address] [place] [address] Phone: [phone] Fax: [phone]

## 2017-12-05 NOTE — Telephone Encounter (Signed)
Pain med filled today 12/05/17 by provider

## 2017-12-06 NOTE — Therapy (Signed)
[facility] [address] [place], [place], [address] Phone: [phone]   Fax:  [phone]  Physical Therapy Evaluation  Patient Details  Name: Adrian Cox MRN: [id number] Date of Birth: 22-Apr-1961 Referring Provider: Kerby NoraBedsole, Amy NP   Encounter Date: 12/05/2017  PT End of Session - 12/05/17 1045    Visit Number  1    Number of Visits  12    Date for PT Re-Evaluation  01/16/18    PT Start Time  0938    PT Stop Time  1044    PT Time Calculation (min)  66 min    Activity Tolerance  Patient tolerated treatment well    Behavior During Therapy  [facility] for tasks assessed/performed       Past Medical History:  Diagnosis Date  . Depression    bipolar  . Diverticulosis   . Hyperlipidemia   . Hypertension   . Mold exposure     Past Surgical History:  Procedure Laterality Date  . BACK SURGERY    . CERVICAL SPINE SURGERY  2003   fusion  . KNEE ARTHROSCOPY Right   . KNEE ARTHROSCOPY Left   . SPINE SURGERY      There were no vitals filed for this visit.   Subjective Assessment - 12/05/17 1000    Subjective  Pain in lower back radiating to left leg and occasionally to right.     Pertinent History  pain in lower back for 20 years or more and has had episodes ever since and back surgery Lumbar laminectomy and then also had cervical spine fusion 2003. this episode began a couple of years ago and has progressively worse with inital pain beginning in his back and now has radiating symptoms into bilateral LE's     Limitations  Sitting;Standing;Lifting;House hold activities    How long can you sit comfortably?  unable to tolerate sitting    How long can you stand comfortably?  <30 min.     How long can you walk comfortably?  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  Yes    Pain Score  10-Worst pain ever best 8/10; worst 10+/10; currently 10+/10    Pain Location  Back    Pain Orientation  Right;Left;Lower    Pain Descriptors / Indicators  Aching;Numbness;Tightness    Pain Type  Acute pain;Chronic pain    Pain Onset  More than a month ago    Pain Frequency  Constant         [facility] PT Assessment - 12/05/17 1008      Assessment   Medical Diagnosis  acute exacerbation of chronic lower back pain    Referring Provider  Kerby NoraBedsole, Amy NP    Onset Date/Surgical Date  10/18/15    Hand Dominance  Right    Prior Therapy  none in last 2 years since this episode began      Home Environment   Living Environment  Private residence    Living Arrangements  Alone;Other (Comment) pet dog    Type of Home  Apartment    Home Access  Stairs to enter    Entrance Stairs-Number of Steps  30    Entrance Stairs-Rails  Right;Left  Home Layout  One level      Prior Function   Level of Independence  Independent    Vocation  Part time employment    Vocation Requirements  golf, read, walk, exercise      Cognition   Overall Cognitive Status  Within Functional Limits for tasks assessed      Observation/Other Assessments   Focus on Therapeutic Outcomes (FOTO)   36      ROM / Strength   AROM / PROM / Strength  AROM;Strength      AROM   Overall AROM Comments  lumbar spine limited 50% lfexion/ extension with pain; bilateral LE's WFL throughout      Strength   Overall Strength Comments  gross strength WFL bilateral LE's hip flexion, knee flexion/extension, ankle DF and great toe extension      Palpation   Palpation comment  guarding and spasms palpable throughout thoracic and lumbar spine bilaterally, left >right      Slump test   Comment  positive both LE's      Straight Leg Raise   Comment  left 30 degrees positive             Objective measurements completed on examination: See above findings.              PT  Education - 12/05/17 1721    Education provided  Yes    Education Details  POC, posture awareness, body mechanics for daily tasks    Person(s) Educated  Patient    Methods  Explanation;Demonstration;Verbal cues;Handout    Comprehension  Verbal cues required;Returned demonstration;Verbalized understanding          PT Long Term Goals - 12/05/17 1726      PT LONG TERM GOAL #1   Title  patient will improve FOTO score to 40 to demonstrate improved function with daily tasks with less back pain, difficulty     Baseline  FOTO 36    Status  New    Target Date  12/26/17      PT LONG TERM GOAL #2   Title  patient will improve FOTO score to 48 to demonstrate improved function with daily tasks with less back pain, difficulty     Baseline  FOTO 36    Status  New    Target Date  01/16/18      PT LONG TERM GOAL #3   Title  patient will be independent with home program of self management for pain control, exercises for strength, flexibility once discharged    Baseline  patient with limited knowledge of appropriate pain control and exercise prorgression without guidance, assistance, cuing    Status  New    Target Date  01/16/18             Plan - 12/05/17 1721    Clinical Impression Statement  Patient is a 57 year old male with acute exacerbation of lower back pain with left LE radicular symptoms. He has limted motion in all directions with lumbar spine, guarded posture with moderate to severe spasms palpable along bilateral paraspinal muscles thoracic to lumbar spine. He has a FOTO score of 36 and moderate self perceived impairment. He has limited knowledge of appropriate pain control strategies or exercise progression and will benefit from physical therapy intervention to imrpove function with daily tasks.     History and Personal Factors relevant to plan of care:  pain in lower back for 20 years or more and has had episodes ever  since and back surgery Lumbar laminectomy and then also had  cervical spine fusion 2003. this episode began a couple of years ago and has progressively worse with inital pain beginning in his back and now has radiating symptoms into bilateral LE's     Clinical Presentation  Evolving    Clinical Presentation due to:  initially pain in back and now radiating into left LE    Clinical Decision Making  Moderate    Rehab Potential  Fair    Clinical Impairments Affecting Rehab Potential  (+)motivated, acute condition (-)chronic back pain, co morbidities    PT Frequency  2x / week    PT Duration  6 weeks    PT Treatment/Interventions  Cryotherapy;Electrical Stimulation;Ultrasound;Moist Heat;Iontophoresis 4mg /ml Dexamethasone;Therapeutic activities;Therapeutic exercise;Patient/family education;Neuromuscular re-education;Manual techniques    PT Next Visit Plan  pain control, exercise progression    PT Home Exercise Plan  body mechanics to decreased back strain, pain control    Consulted and Agree with Plan of Care  Patient       Patient will benefit from skilled therapeutic intervention in order to improve the following deficits and impairments:  Pain, Improper body mechanics, Increased muscle spasms, Decreased activity tolerance, Decreased range of motion, Decreased strength, Impaired perceived functional ability, Difficulty walking  Visit Diagnosis: Acute bilateral low back pain with sciatica, sciatica laterality unspecified - Plan: PT plan of care cert/re-cert  Muscle spasm of back - Plan: PT plan of care cert/re-cert  Difficulty in walking, not elsewhere classified - Plan: PT plan of care cert/re-cert     Problem List Patient Active Problem List   Diagnosis Date Noted  . Marijuana abuse, continuous 11/14/2017  . Acute exacerbation of chronic low back pain 11/09/2017  . Osteoarthritis of spine 02/23/2017  . Chronic right shoulder pain 01/09/2017  . Essential hypertension 08/17/2016  . Bipolar disorder (HCC) 08/17/2016  . Preventative health care  08/17/2016  . Hyperlipidemia 08/17/2016    Beacher May PT 12/06/2017, 5:34 PM  [facility] PH[facility]ICAL AND SPORTS MEDICINE [address] [place], [address] Phone: [phone]   Fax:  [phone]  Name: Adrian Cox MRN: [id number] Date of Birth: 31-Aug-1961

## 2017-12-07 ENCOUNTER — Ambulatory Visit: Payer: Medicare HMO | Admitting: Physical Therapy

## 2017-12-11 ENCOUNTER — Ambulatory Visit (INDEPENDENT_AMBULATORY_CARE_PROVIDER_SITE_OTHER): Payer: Medicare HMO | Admitting: Physical Medicine and Rehabilitation

## 2017-12-11 ENCOUNTER — Ambulatory Visit (INDEPENDENT_AMBULATORY_CARE_PROVIDER_SITE_OTHER): Payer: Medicare HMO

## 2017-12-11 ENCOUNTER — Encounter (INDEPENDENT_AMBULATORY_CARE_PROVIDER_SITE_OTHER): Payer: Self-pay | Admitting: Physical Medicine and Rehabilitation

## 2017-12-11 VITALS — BP 175/96 | HR 74 | Temp 98.7°F

## 2017-12-11 DIAGNOSIS — M961 Postlaminectomy syndrome, not elsewhere classified: Secondary | ICD-10-CM

## 2017-12-11 DIAGNOSIS — M25552 Pain in left hip: Secondary | ICD-10-CM | POA: Diagnosis not present

## 2017-12-11 DIAGNOSIS — M5116 Intervertebral disc disorders with radiculopathy, lumbar region: Secondary | ICD-10-CM | POA: Diagnosis not present

## 2017-12-11 DIAGNOSIS — M48062 Spinal stenosis, lumbar region with neurogenic claudication: Secondary | ICD-10-CM | POA: Diagnosis not present

## 2017-12-11 DIAGNOSIS — M4316 Spondylolisthesis, lumbar region: Secondary | ICD-10-CM

## 2017-12-11 DIAGNOSIS — M5416 Radiculopathy, lumbar region: Secondary | ICD-10-CM | POA: Diagnosis not present

## 2017-12-11 MED ORDER — BETAMETHASONE SOD PHOS & ACET 6 (3-3) MG/ML IJ SUSP
12.0000 mg | Freq: Once | INTRAMUSCULAR | Status: AC
  Administered 2017-12-11: 12 mg

## 2017-12-11 NOTE — Procedures (Signed)
S1 Lumbosacral Transforaminal Epidural Steroid Injection - Sub-Pedicular Approach with Fluoroscopic Guidance   [name]      Date of Birth: 12-20-1960 MRN: [id number] PCP: Excell SeltzerBedsole, Amy E, MD      Visit Date: 12/11/2017   Universal Protocol:    Date/Time: 02/25/199:17 AM  Consent Given By: the patient  Position:  PRONE  Additional Comments: Vital signs were monitored before and after the procedure. Patient was prepped and draped in the usual sterile fashion. The correct patient, procedure, and site was verified.   Injection Procedure Details:  Procedure Site One Meds Administered:  Meds ordered this encounter  Medications  . betamethasone acetate-betamethasone sodium phosphate (CELESTONE) injection 12 mg    Laterality: Left  Location/Site:  S1 Foramen   Needle size: 22 ga.  Needle type: Spinal  Needle Placement: Transforaminal  Findings:   -Comments: Excellent flow of contrast along the nerve and into the epidural space.  Procedure Details: After squaring off the sacral end-plate to get a true AP view, the C-arm was positioned so that the best possible view of the S1 foramen was visualized. The soft tissues overlying this structure were infiltrated with 2-3 ml. of 1% Lidocaine without Epinephrine.    The spinal needle was inserted toward the target using a "trajectory" view along the fluoroscope beam.  Under AP and lateral visualization, the needle was advanced so it did not puncture dura. Biplanar projections were used to confirm position. Aspiration was confirmed to be negative for CSF and/or blood. A 1-2 ml. volume of Isovue-250 was injected and flow of contrast was noted at each level. Radiographs were obtained for documentation purposes.   After attaining the desired flow of contrast documented above, a 0.5 to 1.0 ml test dose of 0.25% Marcaine was injected into each respective transforaminal space.  The patient was observed for 90 seconds post  injection.  After no sensory deficits were reported, and normal lower extremity motor function was noted,   the above injectate was administered so that equal amounts of the injectate were placed at each foramen (level) into the transforaminal epidural space.   Additional Comments:  The patient tolerated the procedure well Dressing: Band-Aid    Post-procedure details: Patient was observed during the procedure. Post-procedure instructions were reviewed.  Patient left the clinic in stable condition.

## 2017-12-11 NOTE — Progress Notes (Deleted)
Pt states a sharp constant pain in lower back that radiates through the left leg with some tingling in left leg. Pt states pain has been going on for years now. Pt states sitting and standing for too long makes the pain worse. Pt states pain medication makes pain better. +Driver, -BT, -Dye Allergies. ACDF Cabbel, prior lumbar surgery Dr. Renae FicklePaul ?

## 2017-12-11 NOTE — Progress Notes (Signed)
Adrian Cox - 57 y.o. male MRN [id number]  Date of birth: 10-Feb-1961  Office Visit Note: Visit Date: 12/11/2017 PCP: Excell Seltzer, MD Referred by: Excell Seltzer, MD  Subjective: Chief Complaint  Patient presents with  . Lower Back - Pain  . Left Leg - Pain, Tingling   HPI: [name] is a 57 year old gentleman who comes in today at the request of Dr. Kerby Nora for evaluation and management of his chronic worsening severe low back left hip and leg pain.  He reports a history of sometime in the fall of having worsening symptoms in the left low back and leg.  He actually reports many years of similar symptoms with exacerbations at times.  In January of this year he did go to the emergency department for evaluation of sort of acute onset and flareup of his left hip and leg pain.  He has been completing physical therapy as well as using gabapentin.  He was also treated with prednisone for a few days as well as a small amount of oxycodone through Dr. Ermalene Searing.  He continues to report a sharp constant pain in the lower back that radiates into the left buttock and posterior lateral hip and leg in a pretty classic L5 or S1 distribution.  He reports that standing for too long or sitting for too long makes the pain worse.  He can change position but just cannot get comfortable.  He reports no symptoms on the right.  He has had no focal weakness.  He has had no red flag complaints of bowel or bladder dysfunction.  His case is complicated by history of bipolar disorder as well as prior left L5 hemilaminectomy and discectomy which was performed in the 1990s by Dr. Renae Fickle actually in our office, [facility].  He has had a prior cervical decompression and fusion by Dr. Franky Macho at [facility] neurosurgery.  He has been followed by Dr. Berline Chough as well for his neck pain.  As far as I know he has not had electrodiagnostic studies of the left lower limb.  He has had a new MRI with contrast performed recently and  this was shown to the patient today and gone over with him with details and spine models.  He has not had no interventions or injections in the lumbar spine.  He does work at [employer] and he says they are good about allowing him to work without heavy lifting.    Review of Systems  Constitutional: Negative for chills, fever, malaise/fatigue and weight loss.  HENT: Negative for hearing loss and sinus pain.   Eyes: Negative for blurred vision, double vision and photophobia.  Respiratory: Negative for cough and shortness of breath.   Cardiovascular: Negative for chest pain, palpitations and leg swelling.  Gastrointestinal: Negative for abdominal pain, nausea and vomiting.  Genitourinary: Negative for flank pain.  Musculoskeletal: Positive for back pain and joint pain. Negative for myalgias.  Skin: Negative for itching and rash.  Neurological: Positive for tingling. Negative for tremors, focal weakness and weakness.  Endo/Heme/Allergies: Negative.   Psychiatric/Behavioral: Negative for depression.  All other systems reviewed and are negative.  Otherwise per HPI.  Assessment & Plan: Visit Diagnoses:  1. Lumbar radiculopathy   2. Spinal stenosis of lumbar region with neurogenic claudication   3. Pain in left hip   4. Post laminectomy syndrome   5. Spondylolisthesis of lumbar region   6. Radiculopathy due to lumbar intervertebral disc disorder     Plan: Findings:  Patient with prior history of lumbar surgery now with chronic back pain particularly on the left side with radicular complaints have been ongoing for years but at least 2-3 months of severe worsening left radicular pain.  MRI findings are interesting in that at L4-5 he does have significant facet arthropathy bilaterally with small facet joint cysts and moderate to severe canal stenosis.  This alone could give him some symptoms on the left side that do fit with his complaints.  He has a grade 1 listhesis anteriorly at this level.  L5-S1 he  has a nonenhancing fairly large cyst within the laminectomy surgical bed.  This is reviewed in the contents of the cyst are really the same gray consistency is some of the fat layers and do not look like fluid in general.  He does have more of a right central disc protrusion.  He does have some granulation and scar tissue.  All of these findings could give him chronic back pain with some left hip and leg pain.  I think given the severity of his symptoms and the fact that he is having to use some oxycodone and his history we should go ahead today complete a diagnostic S1 transforaminal epidural steroid injection on the left.  Depending on the relief could look at an L5 injection versus facet joint block.  Unfortunately with the findings I think he may end up going on to need surgical attention.  He is only 57 years old and does continue to work however.  I do want to make a referral to Dr. Franky Machoabbell who is seen in the past for his cervical spine.  This would be more of a consultation and they could at least ask him what his thoughts are on the patient's surgical needs.  There is nothing right at this point that would warrant surgery right away except for his level of pain.  We will see him back in 2-3 weeks as well.    Meds & Orders:  Meds ordered this encounter  Medications  . betamethasone acetate-betamethasone sodium phosphate (CELESTONE) injection 12 mg    Orders Placed This Encounter  Procedures  . XR C-ARM NO REPORT  . Epidural Steroid injection    Follow-up: Return in about 2 weeks (around 12/25/2017).   Procedures: No procedures performed  S1 Lumbosacral Transforaminal Epidural Steroid Injection - Sub-Pedicular Approach with Fluoroscopic Guidance   [name]      Date of Birth: 1961/04/18 MRN: [id number] PCP: Excell SeltzerBedsole, Amy E, MD      Visit Date: 12/11/2017   Universal Protocol:    Date/Time: 02/25/199:17 AM  Consent Given By: the patient  Position:  PRONE  Additional  Comments: Vital signs were monitored before and after the procedure. Patient was prepped and draped in the usual sterile fashion. The correct patient, procedure, and site was verified.   Injection Procedure Details:  Procedure Site One Meds Administered:  Meds ordered this encounter  Medications  . betamethasone acetate-betamethasone sodium phosphate (CELESTONE) injection 12 mg    Laterality: Left  Location/Site:  S1 Foramen   Needle size: 22 ga.  Needle type: Spinal  Needle Placement: Transforaminal  Findings:   -Comments: Excellent flow of contrast along the nerve and into the epidural space.  Procedure Details: After squaring off the sacral end-plate to get a true AP view, the C-arm was positioned so that the best possible view of the S1 foramen was visualized. The soft tissues overlying this structure were infiltrated with 2-3 ml.  of 1% Lidocaine without Epinephrine.    The spinal needle was inserted toward the target using a "trajectory" view along the fluoroscope beam.  Under AP and lateral visualization, the needle was advanced so it did not puncture dura. Biplanar projections were used to confirm position. Aspiration was confirmed to be negative for CSF and/or blood. A 1-2 ml. volume of Isovue-250 was injected and flow of contrast was noted at each level. Radiographs were obtained for documentation purposes.   After attaining the desired flow of contrast documented above, a 0.5 to 1.0 ml test dose of 0.25% Marcaine was injected into each respective transforaminal space.  The patient was observed for 90 seconds post injection.  After no sensory deficits were reported, and normal lower extremity motor function was noted,   the above injectate was administered so that equal amounts of the injectate were placed at each foramen (level) into the transforaminal epidural space.   Additional Comments:  The patient tolerated the procedure well Dressing: Band-Aid     Post-procedure details: Patient was observed during the procedure. Post-procedure instructions were reviewed.  Patient left the clinic in stable condition.   Clinical History: MRI LUMBAR SPINE WITHOUT AND WITH CONTRAST  TECHNIQUE: Multiplanar and multiecho pulse sequences of the lumbar spine were obtained without and with intravenous contrast.  CONTRAST:  19mL MULTIHANCE GADOBENATE DIMEGLUMINE 529 MG/ML IV SOLN  COMPARISON:  Lumbar spine radiographs November 09, 2017  FINDINGS: SEGMENTATION: For the purposes of this report, the last well-formed intervertebral disc is reported as L5-S1.  ALIGNMENT: Grade 1 L4-5 anterolisthesis and grade 1 L5-S1 retrolisthesis.  VERTEBRAE: Vertebral bodies are intact. Moderate L4-5, moderate to severe L5-S1 disc height loss with disc desiccation compatible with degenerative discs. Mild associated chronic discogenic endplate changes. No acute abnormal bone marrow signal. No suspicious osseous or disc enhancement. L2 superior endplate old Schmorl's node.  CONUS MEDULLARIS AND CAUDA EQUINA: Conus medullaris terminates at T12-L1 and demonstrates normal morphology and signal characteristics. Cauda equina is normal. No abnormal cord, leptomeningeal or epidural enhancement.  PARASPINAL AND OTHER SOFT TISSUES: Included prevertebral and paraspinal soft tissues are normal.  DISC LEVELS:  T12-L1, L1-2, L2-3: No disc bulge, canal stenosis nor neural foraminal narrowing.  L3-4: Annular bulging, small RIGHT extraforaminal disc protrusion. Minimal facet arthropathy and ligamentum flavum redundancy without canal stenosis. Mild RIGHT greater than LEFT neural foraminal narrowing.  L4-5: Anterolisthesis. Moderate broad-based disc bulge. Moderate to severe facet arthropathy with trace reactive effusions. Moderate to severe canal stenosis. Moderate to severe RIGHT, moderate LEFT neural foraminal narrowing. Peripherally enhancing bilateral  facet synovial cysts within bilateral paraspinal soft tissues measuring to 14 mm.  L5-S1: Status post LEFT laminectomy. Nonenhancing 7 x 11 x 12 mm cyst within laminectomy surgical bed. Retrolisthesis. Moderate RIGHT central disc protrusion with enhancing annular fissure/discectomy defect. No canal stenosis though, granulation tissue and narrowing the lateral recesses may affect the traversing S1 nerves. Minimal facet arthropathy. No canal stenosis.  IMPRESSION: 1. Status post LEFT L5 hemilaminectomy. Grade 1 L4-5 anterolisthesis and grade 1 L5-S1 retrolisthesis. No acute osseous process. 2. 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 L5-S1 lateral recess stenosis and granulation tissue may affect the traversing S1 nerves. 3. Neural foraminal narrowing L3-4 through L5-S1: Moderate to severe on the RIGHT at L4-5.   Electronically Signed   By: Awilda Metro M.D.   On: 11/20/2017 22:39  He reports that  has never smoked. he has never used smokeless tobacco. No results for input(s): HGBA1C, LABURIC in the last 8760 hours.  Objective:  VS:  HT:    WT:   BMI:     BP:(!) 175/96  HR:74bpm  TEMP:98.7 F (37.1 C)(Oral)  RESP:97 % Physical Exam  Constitutional: He is oriented to person, place, and time. He appears well-developed and well-nourished. No distress.  HENT:  Head: Normocephalic and atraumatic.  Nose: Nose normal.  Mouth/Throat: Oropharynx is clear and moist.  Eyes: Conjunctivae are normal. Pupils are equal, round, and reactive to light.  Neck: Normal range of motion. Neck supple. No tracheal deviation present.  Cardiovascular: Regular rhythm and intact distal pulses.  Pulmonary/Chest: Effort normal and breath sounds normal.  Abdominal: Soft. He exhibits no distension. There is no rebound and no guarding.  Musculoskeletal: He exhibits no deformity.  The patient ambulates without aid with a antalgic gait to the left.  They have no pain with hip internal or external  rotation.  He does have concordant low back pain with extension rotation to the left.. They have no pain over the greater trochanters or PSIS.  On manual muscle testing there is 5/5 strength in all muscle groups of the lower extremities bilaterally without deficits.  There is no clonus bilaterally.  Slump test is equivocally positive on the left.   Neurological: He is alert and oriented to person, place, and time. He exhibits normal muscle tone. Coordination normal.  Skin: Skin is warm. No rash noted.  Psychiatric: He has a normal mood and affect. His behavior is normal.  Nursing note and vitals reviewed.   Ortho Exam Imaging: Xr C-arm No Report  Result Date: 12/11/2017 Please see Notes or Procedures tab for imaging impression.   Past Medical/Family/Surgical/Social History: Medications & Allergies reviewed per EMR Patient Active Problem List   Diagnosis Date Noted  . Marijuana abuse, continuous 11/14/2017  . Acute exacerbation of chronic low back pain 11/09/2017  . Osteoarthritis of spine 02/23/2017  . Chronic right shoulder pain 01/09/2017  . Essential hypertension 08/17/2016  . Bipolar disorder (HCC) 08/17/2016  . Preventative health care 08/17/2016  . Hyperlipidemia 08/17/2016   Past Medical History:  Diagnosis Date  . Depression    bipolar  . Diverticulosis   . Hyperlipidemia   . Hypertension   . Mold exposure    Family History  Problem Relation Age of Onset  . Cancer Mother   . Colon cancer Mother 71   Past Surgical History:  Procedure Laterality Date  . BACK SURGERY    . CERVICAL SPINE SURGERY  2003   fusion  . KNEE ARTHROSCOPY Right   . KNEE ARTHROSCOPY Left   . SPINE SURGERY     Social History   Occupational History  . Not on file  Tobacco Use  . Smoking status: Never Smoker  . Smokeless tobacco: Never Used  Substance and Sexual Activity  . Alcohol use: No  . Drug use: Yes    Types: Marijuana    Comment: Hx marijuana use - none x 2 yrs  . Sexual  activity: Yes

## 2017-12-11 NOTE — Patient Instructions (Signed)

## 2017-12-12 ENCOUNTER — Encounter: Payer: Self-pay | Admitting: Physical Therapy

## 2017-12-12 ENCOUNTER — Ambulatory Visit: Payer: Medicare HMO | Admitting: Physical Therapy

## 2017-12-12 ENCOUNTER — Telehealth (INDEPENDENT_AMBULATORY_CARE_PROVIDER_SITE_OTHER): Payer: Self-pay | Admitting: Physical Medicine and Rehabilitation

## 2017-12-12 DIAGNOSIS — R262 Difficulty in walking, not elsewhere classified: Secondary | ICD-10-CM

## 2017-12-12 DIAGNOSIS — M62830 Muscle spasm of back: Secondary | ICD-10-CM | POA: Diagnosis not present

## 2017-12-12 DIAGNOSIS — M5440 Lumbago with sciatica, unspecified side: Secondary | ICD-10-CM | POA: Diagnosis not present

## 2017-12-12 NOTE — Therapy (Addendum)
[facility] [address], [place], [address] Phone: [phone]   Fax:  [phone]  Physical Therapy Treatment  Patient Details  Name: Adrian Cox MRN: [id number] Date of Birth: 27-Apr-1961 Referring Provider: Eliezer Lofts NP   Encounter Date: 12/12/2017  PT End of Session - 12/12/17 0943    Visit Number  2    Number of Visits  12    Date for PT Re-Evaluation  01/16/18    Authorization Type  2 of 6 FOTO    PT Start Time  0925    PT Stop Time  0942    PT Time Calculation (min)  17 min    Activity Tolerance  Patient tolerated treatment well    Behavior During Therapy  [facility] for tasks assessed/performed       Past Medical History:  Diagnosis Date  . Depression    bipolar  . Diverticulosis   . Hyperlipidemia   . Hypertension   . Mold exposure     Past Surgical History:  Procedure Laterality Date  . BACK SURGERY    . CERVICAL SPINE SURGERY  2003   fusion  . KNEE ARTHROSCOPY Right   . KNEE ARTHROSCOPY Left   . SPINE SURGERY      There were no vitals filed for this visit.  Subjective Assessment - 12/12/17 0924    Subjective  Patient reports he had epidural injection yesterday and he feels less tense in muscles and still having symptoms in LE's that is aggravated by sitting. He is arriving late today due to traffic.    Pertinent History  pain in lower back for 20 years or more and has had episodes ever since and back surgery Lumbar laminectomy and then also had cervical spine fusion 2003. this episode began a couple of years ago and has progressively worse with inital pain beginning in his back and now has radiating symptoms into bilateral LE's     Limitations  Sitting;Standing;Lifting;House hold activities    How long can you sit comfortably?  unable to tolerate sitting    How long can you stand comfortably?  <30 min.     How long can you walk comfortably?  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  Yes    Pain Score  3  3/10 standing/walking; 5/10 sitting    Pain Location  Back    Pain Orientation  Lower    Pain Descriptors / Indicators  Aching;Numbness numbness left lower back/LE and left foot tight    Pain Type  Chronic pain;Acute pain    Pain Onset  More than a month ago    Pain Frequency  Constant    Aggravating Factors   sitting, bending    Pain Relieving Factors  standing, walking    Effect of Pain on Daily Activities  limits driving, sitting activities        Objective: Gait; WFL independent without AD  Treatment:  Therapeutic exercise: patient performed with demonstration, verbal and tactile cues of therapist: goal: improve FOTO, MODI, independent with home program Standing: Scapular rows high and low: 15 reps with black resistive tubing Straight arm pull downs to side of hips x 15 black resistive tubing walking on diagonal with  5# dumbbells in hands x 2 min  Patient response to treatment: patient demonstrated improved technique with exercises with minimal VC for correct alignment and repeated demonstration. Patient reported fatigue and feeling like his muscles were working in his back and sides with diagonal walking with weights.      PT Education - 12/12/17 0930    Education provided  Yes    Education Details  HEP: core strengthting standing scapular rows, walking with weight on diagonal    Person(s) Educated  Patient    Methods  Explanation;Demonstration;Verbal cues    Comprehension  Verbal cues required;Returned demonstration;Verbalized understanding          PT Long Term Goals - 12/05/17 1726      PT LONG TERM GOAL #1   Title  patient will improve FOTO score to 40 to demonstrate improved function with daily tasks with less back pain, difficulty     Baseline  FOTO 36     Status  New    Target Date  12/26/17      PT LONG TERM GOAL #2   Title  patient will improve FOTO score to 48 to demonstrate improved function with daily tasks with less back pain, difficulty     Baseline  FOTO 36    Status  New    Target Date  01/16/18      PT LONG TERM GOAL #3   Title  patient will be independent with home program of self management for pain control, exercises for strength, flexibility once discharged    Baseline  patient with limited knowledge of appropriate pain control and exercise prorgression without guidance, assistance, cuing    Status  New    Target Date  01/16/18            Plan - 12/12/17 0945    Clinical Impression Statement   Patient is progressing with exercises for core strengthening with minimal cuing and instruciton. sore at end of session. Limited session due to patient arriving >20 minutes late to appointment.     Rehab Potential  Fair    Clinical Impairments Affecting Rehab Potential  (+)motivated, acute condition (-)chronic back pain, co morbidities    PT Frequency  2x / week    PT Duration  6 weeks    PT Treatment/Interventions  Cryotherapy;Electrical Stimulation;Ultrasound;Moist Heat;Iontophoresis 68m/ml Dexamethasone;Therapeutic activities;Therapeutic exercise;Patient/family education;Neuromuscular re-education;Manual techniques    PT Next Visit Plan  pain control, exercise progression    PT Home Exercise Plan  body mechanics to decreased back strain, pain control       Patient will benefit from skilled therapeutic intervention in order to improve the following deficits and impairments:  Pain, Improper body mechanics, Increased muscle spasms, Decreased activity tolerance, Decreased range of motion, Decreased strength, Impaired perceived functional ability, Difficulty walking  Visit Diagnosis: Acute bilateral low back pain with sciatica, sciatica laterality unspecified  Muscle spasm of back  Difficulty in walking, not elsewhere  classified     Problem List Patient Active Problem List   Diagnosis Date Noted  . Marijuana abuse, continuous 11/14/2017  . Acute exacerbation of chronic low back pain 11/09/2017  . Osteoarthritis of spine 02/23/2017  . Chronic right shoulder pain 01/09/2017  . Essential hypertension 08/17/2016  . Bipolar disorder ([place] 08/17/2016  . Preventative health care 08/17/2016  . Hyperlipidemia 08/17/2016    BAldona Lento2/26/2019, 7:43 PM  [facility] [address] [place] [address]Phone: [phone]  Fax:  [phone]  Name: KRUZE ATCHLEY MRN: [id number] Date of Birth: 06-09-1961      PHYSICAL THERAPY DISCHARGE SUMMARY  Visits from Start of Care:  2   Plan: Patient agrees to discharge.  Patient goals were met. Patient is being discharged due to meeting the stated rehab goals.  ?????     Lyndee Hensen, PT, DPT 2:48 PM  01/02/19

## 2017-12-12 NOTE — Telephone Encounter (Signed)
Regina with [facility] called advising that patient had been terminated from their practice since 2005 - a referral was sent to Dr. Franky Machoabbell at their location so a referral needs to be sent somewhere else for this patient. Thanks

## 2017-12-12 NOTE — Telephone Encounter (Signed)
Please advise 

## 2017-12-13 NOTE — Telephone Encounter (Signed)
Can we schedule with Dr. Otelia SergeantNitka as new patient from me - he has had remote lumbar surgery but ACDF with [facility]

## 2017-12-13 NOTE — Telephone Encounter (Signed)
See messages below. Can you schedule this patient or let me know when a good time would be and I will call him?

## 2017-12-15 ENCOUNTER — Encounter: Payer: Self-pay | Admitting: Sports Medicine

## 2017-12-15 ENCOUNTER — Ambulatory Visit: Payer: Medicare HMO | Admitting: Sports Medicine

## 2017-12-15 VITALS — BP 150/90 | HR 67 | Ht 71.5 in | Wt 208.6 lb

## 2017-12-15 DIAGNOSIS — M9908 Segmental and somatic dysfunction of rib cage: Secondary | ICD-10-CM | POA: Diagnosis not present

## 2017-12-15 DIAGNOSIS — M9902 Segmental and somatic dysfunction of thoracic region: Secondary | ICD-10-CM

## 2017-12-15 DIAGNOSIS — M47812 Spondylosis without myelopathy or radiculopathy, cervical region: Secondary | ICD-10-CM

## 2017-12-15 DIAGNOSIS — M25511 Pain in right shoulder: Secondary | ICD-10-CM | POA: Diagnosis not present

## 2017-12-15 DIAGNOSIS — G8929 Other chronic pain: Secondary | ICD-10-CM | POA: Diagnosis not present

## 2017-12-15 DIAGNOSIS — M9901 Segmental and somatic dysfunction of cervical region: Secondary | ICD-10-CM

## 2017-12-15 NOTE — Assessment & Plan Note (Signed)
His prior right shoulder pain had significantly improved prior to the last several weeks.  He has had worsening and he relates to undergoing the epidural steroid injection for his lumbar spine I suspect this is coming from more functional position from laying prone which is not a position he typically is in.  There were significant osteopathic findings in his upper thoracic spine and with manipulation today he reported great improvement and I suspect that he will only do better without having to increase any type of pain medication at this time.  He can continue with a muscle relaxer as needed and work on therapeutic exercises that were reviewed again today with him.  Care was taken to avoid any type of high velocity low amplitude manipulation to the cervical spine and only gentle soft tissue work was performed.  If any lack of improvement can consider further medications but at this time he would like to see how he does with just the manipulation.

## 2017-12-15 NOTE — Progress Notes (Signed)
[place]. [facility] at [facility] [phone]  Adrian Cox - 56 y.o. male MRN [id number]  Date of birth: 02-Jul-1961  Visit Date: 12/15/2017  PCP: Excell Seltzer, MD   Referred by: Excell Seltzer, MD   Scribe for today's visit: Stevenson Clinch, CMA     SUBJECTIVE:  Adrian Cox is here for No chief complaint on file.  03/29/17: Pt presents today in follow-up of chronic right shoulder pain and osteoarthritis of c-spine. He is still taking Gabapentin and getting relief from pain with this. He is no longer taking Diclofenac. He is still using his compression sleeve but has stopped using the KT tape. He reports that over all he is doing well. He doesn't have much but, just occasional stiffness. He is very happy because he can play golf now. He reports that his shoulder sometimes will feel "exhausted" after a long active day.  Pt denies fever, chills, night sweats.   12/15/17 Compared to the last office visit on 03/29/17, his previously described symptoms show no change.  Had lumbar epidural on Monday and is now having pain in B shoulders. Current symptoms are moderate & are nonradiating He has been taking Oxycodone and Gabapentin.  He is going to PT and has completed 2 visits so far.   ROS Reports night time disturbances. Denies fevers, chills, or night sweats. Denies unexplained weight loss. Denies personal history of cancer. Denies changes in bowel or bladder habits. Denies recent unreported falls. Denies new or worsening dyspnea or wheezing. Denies headaches or dizziness.  Reports numbness, tingling or weakness  In the extremities - in B LEs. Denies dizziness or presyncopal episodes Reports lower extremity edema - L LE.   HISTORY & PERTINENT PRIOR DATA:  Prior History reviewed and updated per electronic medical record.  Significant history, findings, studies and interim changes include:  reports that  has never smoked.  he has never used smokeless tobacco. No results for input(s): HGBA1C, LABURIC, CREATINE in the last 8760 hours. No specialty comments available. Problem  Osteoarthritis of Spine   Status post single level fusion at C6-7 by Dr. Mikal Plane Moderate adjacent level disease with right-sided radiculitis     OBJECTIVE:  VS:  HT:5' 11.5" (181.6 cm)   WT:208 lb 9.6 oz (94.6 kg)  BMI:28.69    BP:(!) 150/90  HR:67bpm  TEMP: ( )  RESP:97 %   PHYSICAL EXAM: Constitutional: WDWN, Non-toxic appearing. Psychiatric: Alert & appropriately interactive.  Not depressed or anxious appearing. Respiratory: No increased work of breathing.  Trachea Midline Eyes: Pupils are equal.  EOM intact without nystagmus.  No scleral icterus  NEUROVASCULAR exam: No clubbing or cyanosis appreciated No significant venous stasis changes Capillary Refill: normal, less than 2 seconds   Right shoulder is overall well aligned but held in slight protraction.  He has full overhead range of motion.  Internal and external rotation strength is intact.  He has no significant pain with empty can testing, speeds testing or O'Brien's testing.  Otherwise exam per osteopathic findings.  No pain with arm squeeze test or with brachial plexus squeeze.  Reflexes are normal.  ASSESSMENT & PLAN:   1. Somatic dysfunction of thoracic region   2. Somatic dysfunction of cervical region   3. Somatic dysfunction of rib cage region   4. Spondylosis of cervical region without myelopathy or radiculopathy   5. Chronic right shoulder pain    ++++++++++++++++++++++++++++++++++++++++++++ Orders & Meds: No orders of  the defined types were placed in this encounter.  No orders of the defined types were placed in this encounter.   ++++++++++++++++++++++++++++++++++++++++++++ PLAN:   No additional findings.  Osteoarthritis of spine His prior right shoulder pain had significantly improved prior to the last several weeks.  He has had worsening and he  relates to undergoing the epidural steroid injection for his lumbar spine I suspect this is coming from more functional position from laying prone which is not a position he typically is in.  There were significant osteopathic findings in his upper thoracic spine and with manipulation today he reported great improvement and I suspect that he will only do better without having to increase any type of pain medication at this time.  He can continue with a muscle relaxer as needed and work on therapeutic exercises that were reviewed again today with him.  Care was taken to avoid any type of high velocity low amplitude manipulation to the cervical spine and only gentle soft tissue work was performed.  If any lack of improvement can consider further medications but at this time he would like to see how he does with just the manipulation.   Follow-up: Return in about 3 weeks (around 01/05/2018).   Pertinent documentation may be included in additional procedure notes, imaging studies, problem based documentation and patient instructions. Please see these sections of the encounter for additional information regarding this visit. CMA/ATC served as Neurosurgeonscribe during this visit. History, Physical, and Plan performed by medical provider. Documentation and orders reviewed and attested to.      Andrena MewsMichael D Amoni Scallan, DO    [place]

## 2017-12-15 NOTE — Procedures (Signed)
PROCEDURE NOTE : OSTEOPATHIC MANIPULATION The decision today to treat with Osteopathic Manipulative Therapy (OMT) was based on physical exam findings. Verbal consent was obtained following a discussion with the patient regarding the of risks, benefits and potential side effects, including an acute pain flare,post manipulation soreness and need for repeat treatments.     Contraindications to OMT reviewed and include: prior C-spine to   Manipulation was performed as below: Regions treated: Cervical spine (soft tissue only), Thoracic Spine, Ribs Techniques used: HVLA, muscle energy, myofascial release and Articulatory  The patient tolerated the treatment well and reported Resolved symptoms following treatment today. Patient was given medications, exercises, stretches and lifestyle modifications per AVS and verbally.     OSTEOPATHIC/STRUCTURAL EXAM FINDINGS:   Head Tilt:  right Sup. Scapula:  right Restricted Shoulder:  right  SOMATIC DYSFUNCTION         Cervical: C7, Extension, Side bend: right and Rotation: right        Thoracic: T2-T6 - Neutral Side bend: left and Rotation: right           Lumbar:              Ribs: Posterior Rib 8 on Right  Posterior Rib 3 on LEFT        Sacrum:             Pelvis:                 UE:                 LE:

## 2017-12-15 NOTE — Patient Instructions (Signed)

## 2017-12-17 ENCOUNTER — Encounter: Payer: Self-pay | Admitting: Family Medicine

## 2017-12-18 ENCOUNTER — Ambulatory Visit: Payer: Medicare HMO | Attending: Family Medicine | Admitting: Physical Therapy

## 2017-12-20 ENCOUNTER — Ambulatory Visit: Payer: Medicare HMO | Admitting: Physical Therapy

## 2017-12-20 NOTE — Telephone Encounter (Signed)
Is this ok to schedule with you? Looks like per Dr. Alvester MorinNewton he had a Lumbar ACDF with [facility], but per Dr. Sueanne Margaritaabbell's office patient was terminated from their office in 2005 so they will not see the patient back.  Please advise.

## 2017-12-20 NOTE — Telephone Encounter (Signed)
His name sounds familiar and I probably have seen him before, review of SRS though does not show any records, I will see him if he can provide records of previous visits to Dr. Mikal [facility]aneabell and to [facility] and any other provider he  Has been to for this condition. Adrian Cox

## 2017-12-25 ENCOUNTER — Other Ambulatory Visit: Payer: Self-pay | Admitting: Sports Medicine

## 2017-12-25 ENCOUNTER — Ambulatory Visit: Payer: Medicare HMO | Admitting: Physical Therapy

## 2017-12-25 ENCOUNTER — Encounter: Payer: Self-pay | Admitting: Family Medicine

## 2017-12-26 ENCOUNTER — Telehealth (INDEPENDENT_AMBULATORY_CARE_PROVIDER_SITE_OTHER): Payer: Self-pay | Admitting: Physical Medicine and Rehabilitation

## 2017-12-26 ENCOUNTER — Ambulatory Visit (INDEPENDENT_AMBULATORY_CARE_PROVIDER_SITE_OTHER): Payer: Medicare HMO | Admitting: Physical Medicine and Rehabilitation

## 2017-12-26 ENCOUNTER — Encounter (INDEPENDENT_AMBULATORY_CARE_PROVIDER_SITE_OTHER): Payer: Self-pay | Admitting: Physical Medicine and Rehabilitation

## 2017-12-26 VITALS — BP 150/81 | HR 76

## 2017-12-26 DIAGNOSIS — M25552 Pain in left hip: Secondary | ICD-10-CM | POA: Diagnosis not present

## 2017-12-26 DIAGNOSIS — M961 Postlaminectomy syndrome, not elsewhere classified: Secondary | ICD-10-CM

## 2017-12-26 DIAGNOSIS — M48062 Spinal stenosis, lumbar region with neurogenic claudication: Secondary | ICD-10-CM | POA: Diagnosis not present

## 2017-12-26 DIAGNOSIS — M5416 Radiculopathy, lumbar region: Secondary | ICD-10-CM | POA: Diagnosis not present

## 2017-12-26 MED ORDER — HYDROCODONE-ACETAMINOPHEN 5-325 MG PO TABS: 1.0000 | ORAL_TABLET | Freq: Four times a day (QID) | ORAL | 0 refills | Status: DC | PRN

## 2017-12-26 NOTE — Progress Notes (Signed)
  Numeric Pain Rating Scale and Functional Assessment Average Pain 8 Pain Right Now 9 My pain is constant, sharp and aching Pain is worse with: sitting and standing Pain improves with: Walking   In the last MONTH (on 0-10 scale) has pain interfered with the following?  1. General activity like being  able to carry out your everyday physical activities such as walking, climbing stairs, carrying groceries, or moving a chair?  Rating(10)  2. Relation with others like being able to carry out your usual social activities and roles such as  activities at home, at work and in your community. Rating(10)  3. Enjoyment of life such that you have  been bothered by emotional problems such as feeling anxious, depressed or irritable?  Rating(10)

## 2017-12-27 ENCOUNTER — Ambulatory Visit: Payer: Medicare HMO | Admitting: Physical Therapy

## 2017-12-27 NOTE — Telephone Encounter (Signed)
I called and lmom for him to see if he can get his records from Dr. Sueanne Margaritaabbell's office for us to review, an dwe can call him back to schedule appt [facility]

## 2017-12-28 ENCOUNTER — Encounter (INDEPENDENT_AMBULATORY_CARE_PROVIDER_SITE_OTHER): Payer: Self-pay | Admitting: Physical Medicine and Rehabilitation

## 2017-12-28 NOTE — Progress Notes (Signed)
Adrian Cox - 57 y.o. male MRN [id number]  Date of birth: 1960-11-15  Office Visit Note: Visit Date: 12/26/2017 PCP: Excell SeltzerBedsole, Amy E, MD Referred by: Excell SeltzerBedsole, Amy E, MD  Subjective: Chief Complaint  Patient presents with  . Lower Back - Pain  . Left Hip - Pain  . Left Leg - Pain, Numbness   HPI: Adrian Cox is a 57 year old gentleman that comes in today after having had left S1 transforaminal epidural steroid injection on February 25 in our office.  He reports that for the first week he felt really great and then the pain started to return this past Monday.  He now endorses average pain of an 8 out of 10 which is constant sharp and aching into the left hip and leg worse with sitting and standing.  He reports that the only thing other than the injection that helped has been oxycodone.  Dr. Ermalene SearingBedsole his primary care physician had given him a small amount of this.  He continues to have this left hip and leg pain with MRI findings which are significant.  He has stenosis at L4-5 multifactorial.  He has issues at L5-S1 with prior laminectomy as well as cystic structure in the laminectomy bed that could affect the S1 nerve root.  His prior surgeries by [facility] Alphonzo Lemmingsabbel [facility].  He has not been back to them for treatment for this particular episode.  He has been in physical therapy which has not helped that much but has helped some.  His history is complicated by bipolar disorder and history of chronic marijuana use.  He denies any new trauma or focal weakness.    Review of Systems  Constitutional: Negative for chills, fever, malaise/fatigue and weight loss.  HENT: Negative for hearing loss and sinus pain.   Eyes: Negative for blurred vision, double vision and photophobia.  Respiratory: Negative for cough and shortness of breath.   Cardiovascular: Negative for chest pain, palpitations and leg swelling.  Gastrointestinal: Negative for abdominal pain, nausea and  vomiting.  Genitourinary: Negative for flank pain.  Musculoskeletal: Positive for back pain and joint pain. Negative for myalgias.  Skin: Negative for itching and rash.  Neurological: Positive for tingling. Negative for tremors, focal weakness and weakness.  Endo/Heme/Allergies: Negative.   Psychiatric/Behavioral: Negative for depression.  All other systems reviewed and are negative.  Otherwise per HPI.  Assessment & Plan: Visit Diagnoses:  1. Lumbar radiculopathy   2. Spinal stenosis of lumbar region with neurogenic claudication   3. Post laminectomy syndrome   4. Pain in left hip     Plan: Findings:  Diagnostic S1 transforaminal injection did seem to alleviate his symptoms for about 1 week and then his symptoms returned and are just as bad now as they were prior to the injection.  He has significant issues of the lumbar spine which seemed to me could be potentially surgical given the amount of stenosis and prior surgery.  We discussed this at length and I want him to return to see Dr. Franky Machoabbell.  I also want to try an L5 transforaminal injection just to see if we get more relief at a different level that can be long-lasting he does want to proceed with that.  I discussed with him the use of chronic opioids being probably a bad idea in his condition.  I told him I would not prescribe oxycodone at this point but I would give him a small amount of hydrocodone to see  if we can help him out until we do the injection.  Depending on issues with surgical consultation and ongoing pain he might be better off in a comprehensive pain management program either locally or at a tertiary care center like [facility].  He continues to do his exercises and be active.  His exam is fairly benign other than severe pain complaints.  We will see him back for the injection.    Meds & Orders:  Meds ordered this encounter  Medications  . HYDROcodone-acetaminophen (NORCO/VICODIN) 5-325 MG tablet    Sig: Take 1 tablet  by mouth every 6 (six) hours as needed for moderate pain.    Dispense:  30 tablet    Refill:  0   No orders of the defined types were placed in this encounter.   Follow-up: No Follow-up on file.   Procedures: No procedures performed  No notes on file   Clinical History: MRI LUMBAR SPINE WITHOUT AND WITH CONTRAST  TECHNIQUE: Multiplanar and multiecho pulse sequences of the lumbar spine were obtained without and with intravenous contrast.  CONTRAST:  19mL MULTIHANCE GADOBENATE DIMEGLUMINE 529 MG/ML IV SOLN  COMPARISON:  Lumbar spine radiographs November 09, 2017  FINDINGS: SEGMENTATION: For the purposes of this report, the last well-formed intervertebral disc is reported as L5-S1.  ALIGNMENT: Grade 1 L4-5 anterolisthesis and grade 1 L5-S1 retrolisthesis.  VERTEBRAE: Vertebral bodies are intact. Moderate L4-5, moderate to severe L5-S1 disc height loss with disc desiccation compatible with degenerative discs. Mild associated chronic discogenic endplate changes. No acute abnormal bone marrow signal. No suspicious osseous or disc enhancement. L2 superior endplate old Schmorl's node.  CONUS MEDULLARIS AND CAUDA EQUINA: Conus medullaris terminates at T12-L1 and demonstrates normal morphology and signal characteristics. Cauda equina is normal. No abnormal cord, leptomeningeal or epidural enhancement.  PARASPINAL AND OTHER SOFT TISSUES: Included prevertebral and paraspinal soft tissues are normal.  DISC LEVELS:  T12-L1, L1-2, L2-3: No disc bulge, canal stenosis nor neural foraminal narrowing.  L3-4: Annular bulging, small RIGHT extraforaminal disc protrusion. Minimal facet arthropathy and ligamentum flavum redundancy without canal stenosis. Mild RIGHT greater than LEFT neural foraminal narrowing.  L4-5: Anterolisthesis. Moderate broad-based disc bulge. Moderate to severe facet arthropathy with trace reactive effusions. Moderate to severe canal stenosis.  Moderate to severe RIGHT, moderate LEFT neural foraminal narrowing. Peripherally enhancing bilateral facet synovial cysts within bilateral paraspinal soft tissues measuring to 14 mm.  L5-S1: Status post LEFT laminectomy. Nonenhancing 7 x 11 x 12 mm cyst within laminectomy surgical bed. Retrolisthesis. Moderate RIGHT central disc protrusion with enhancing annular fissure/discectomy defect. No canal stenosis though, granulation tissue and narrowing the lateral recesses may affect the traversing S1 nerves. Minimal facet arthropathy. No canal stenosis.  IMPRESSION: 1. Status post LEFT L5 hemilaminectomy. Grade 1 L4-5 anterolisthesis and grade 1 L5-S1 retrolisthesis. No acute osseous process. 2. 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 L5-S1 lateral recess stenosis and granulation tissue may affect the traversing S1 nerves. 3. Neural foraminal narrowing L3-4 through L5-S1: Moderate to severe on the RIGHT at L4-5.   Electronically Signed   By: Awilda Metro M.D.   On: 11/20/2017 22:39   He reports that  has never smoked. he has never used smokeless tobacco. No results for input(s): HGBA1C, LABURIC in the last 8760 hours.  Objective:  VS:  HT:    WT:   BMI:     BP:(!) 150/81  HR:76bpm  TEMP: ( )  RESP:  Physical Exam  Constitutional: He is oriented to person, place,  and time. He appears well-developed and well-nourished. No distress.  HENT:  Head: Normocephalic and atraumatic.  Nose: Nose normal.  Mouth/Throat: Oropharynx is clear and moist.  Eyes: Conjunctivae are normal. Pupils are equal, round, and reactive to light.  Neck: Normal range of motion. Neck supple. No tracheal deviation present.  Cardiovascular: Regular rhythm and intact distal pulses.  Pulmonary/Chest: Effort normal and breath sounds normal.  Abdominal: Soft. He exhibits no distension. There is no rebound and no guarding.  Musculoskeletal: He exhibits no deformity.  Patient does seem to endorse pain with  slump testing on the left.  He is uncomfortable sitting in the exam chair.  He does arise from a seated position with some difficulty.  He does have pain with extension of the lumbar spine.  He has no pain with rotation of the hips.  No pain over the greater trochanters.  He has good distal strength.  Neurological: He is alert and oriented to person, place, and time. He exhibits normal muscle tone. Coordination normal.  Skin: Skin is warm. No rash noted.  Psychiatric: He has a normal mood and affect. His behavior is normal.  Nursing note and vitals reviewed.   Ortho Exam Imaging: No results found.  Past Medical/Family/Surgical/Social History: Medications & Allergies reviewed per EMR, new medications updated. Patient Active Problem List   Diagnosis Date Noted  . Marijuana abuse, continuous 11/14/2017  . Acute exacerbation of chronic low back pain 11/09/2017  . Osteoarthritis of spine 02/23/2017  . Chronic right shoulder pain 01/09/2017  . Essential hypertension 08/17/2016  . Bipolar disorder (HCC) 08/17/2016  . Preventative health care 08/17/2016  . Hyperlipidemia 08/17/2016   Past Medical History:  Diagnosis Date  . Depression    bipolar  . Diverticulosis   . Hyperlipidemia   . Hypertension   . Mold exposure    Family History  Problem Relation Age of Onset  . Cancer Mother   . Colon cancer Mother 11   Past Surgical History:  Procedure Laterality Date  . BACK SURGERY    . CERVICAL SPINE SURGERY  2003   fusion  . KNEE ARTHROSCOPY Right   . KNEE ARTHROSCOPY Left   . SPINE SURGERY     Social History   Occupational History  . Not on file  Tobacco Use  . Smoking status: Never Smoker  . Smokeless tobacco: Never Used  Substance and Sexual Activity  . Alcohol use: No  . Drug use: Yes    Types: Marijuana    Comment: Hx marijuana use - none x 2 yrs  . Sexual activity: Yes

## 2017-12-29 ENCOUNTER — Ambulatory Visit (HOSPITAL_COMMUNITY): Payer: Self-pay | Admitting: Psychiatry

## 2017-12-29 MED ORDER — PREGABALIN 75 MG PO CAPS: 75.0000 mg | ORAL_CAPSULE | Freq: Two times a day (BID) | ORAL | 1 refills | Status: DC

## 2017-12-29 NOTE — Telephone Encounter (Signed)
Auth No / Request ID  [id number]  Status  Auto-Approved  Decision  Approved  Effective Date  01/15/2018  Expiration Date  03/01/2018   Called pt and left vm#1.

## 2018-01-01 ENCOUNTER — Ambulatory Visit: Payer: Medicare HMO | Admitting: Physical Therapy

## 2018-01-01 NOTE — Telephone Encounter (Signed)
Called pt and left vm #2.  

## 2018-01-03 ENCOUNTER — Encounter (INDEPENDENT_AMBULATORY_CARE_PROVIDER_SITE_OTHER): Payer: Self-pay | Admitting: Physical Medicine and Rehabilitation

## 2018-01-03 ENCOUNTER — Ambulatory Visit: Payer: Medicare HMO | Admitting: Physical Therapy

## 2018-01-04 ENCOUNTER — Telehealth (INDEPENDENT_AMBULATORY_CARE_PROVIDER_SITE_OTHER): Payer: Self-pay | Admitting: *Deleted

## 2018-01-04 NOTE — Telephone Encounter (Signed)
Pt is scheduled for 01/09/18 for ESI. Pt was informed to have driver and he is not on any BTs.

## 2018-01-05 ENCOUNTER — Ambulatory Visit: Payer: Medicare HMO | Admitting: Sports Medicine

## 2018-01-05 ENCOUNTER — Encounter: Payer: Self-pay | Admitting: Sports Medicine

## 2018-01-05 DIAGNOSIS — Z0289 Encounter for other administrative examinations: Secondary | ICD-10-CM

## 2018-01-08 ENCOUNTER — Encounter: Payer: Medicare HMO | Admitting: Physical Therapy

## 2018-01-09 ENCOUNTER — Ambulatory Visit (INDEPENDENT_AMBULATORY_CARE_PROVIDER_SITE_OTHER): Payer: Medicare HMO

## 2018-01-09 ENCOUNTER — Encounter (INDEPENDENT_AMBULATORY_CARE_PROVIDER_SITE_OTHER): Payer: Self-pay | Admitting: Physical Medicine and Rehabilitation

## 2018-01-09 ENCOUNTER — Ambulatory Visit (INDEPENDENT_AMBULATORY_CARE_PROVIDER_SITE_OTHER): Payer: Medicare HMO | Admitting: Physical Medicine and Rehabilitation

## 2018-01-09 VITALS — BP 153/92 | HR 68 | Temp 97.8°F

## 2018-01-09 DIAGNOSIS — M48062 Spinal stenosis, lumbar region with neurogenic claudication: Secondary | ICD-10-CM | POA: Diagnosis not present

## 2018-01-09 DIAGNOSIS — M961 Postlaminectomy syndrome, not elsewhere classified: Secondary | ICD-10-CM

## 2018-01-09 DIAGNOSIS — M5416 Radiculopathy, lumbar region: Secondary | ICD-10-CM

## 2018-01-09 MED ORDER — BETAMETHASONE SOD PHOS & ACET 6 (3-3) MG/ML IJ SUSP
12.0000 mg | Freq: Once | INTRAMUSCULAR | Status: AC
  Administered 2018-01-09: 12 mg

## 2018-01-09 MED ORDER — PREGABALIN 75 MG PO CAPS: 75.0000 mg | ORAL_CAPSULE | Freq: Two times a day (BID) | ORAL | 6 refills | Status: DC

## 2018-01-09 NOTE — Patient Instructions (Signed)

## 2018-01-09 NOTE — Progress Notes (Signed)
   Numeric Pain Rating Scale and Functional Assessment Average Pain 10   In the last MONTH (on 0-10 scale) has pain interfered with the following?  1. General activity like being  able to carry out your everyday physical activities such as walking, climbing stairs, carrying groceries, or moving a chair?  Rating(8)   +Driver, -BT, -Dye Allergies.  

## 2018-01-10 ENCOUNTER — Encounter (INDEPENDENT_AMBULATORY_CARE_PROVIDER_SITE_OTHER): Payer: Self-pay | Admitting: Physical Medicine and Rehabilitation

## 2018-01-11 ENCOUNTER — Other Ambulatory Visit (INDEPENDENT_AMBULATORY_CARE_PROVIDER_SITE_OTHER): Payer: Self-pay | Admitting: Physical Medicine and Rehabilitation

## 2018-01-11 ENCOUNTER — Encounter: Payer: Medicare HMO | Admitting: Physical Therapy

## 2018-01-11 MED ORDER — HYDROCODONE-ACETAMINOPHEN 5-325 MG PO TABS: 1.0000 | ORAL_TABLET | Freq: Two times a day (BID) | ORAL | 0 refills | Status: DC | PRN

## 2018-01-11 NOTE — Progress Notes (Signed)
Controlled substance database was queried.  No aberrant or other pain medications prescribed recently.  This would be a short course to see if the injection has helped him.  He is likely going to need referral to a spine surgeon.

## 2018-01-16 ENCOUNTER — Encounter (INDEPENDENT_AMBULATORY_CARE_PROVIDER_SITE_OTHER): Payer: Self-pay | Admitting: Physical Medicine and Rehabilitation

## 2018-01-16 ENCOUNTER — Telehealth (INDEPENDENT_AMBULATORY_CARE_PROVIDER_SITE_OTHER): Payer: Self-pay | Admitting: Physical Medicine and Rehabilitation

## 2018-01-18 ENCOUNTER — Other Ambulatory Visit (INDEPENDENT_AMBULATORY_CARE_PROVIDER_SITE_OTHER): Payer: Self-pay | Admitting: Physical Medicine and Rehabilitation

## 2018-01-18 DIAGNOSIS — M48062 Spinal stenosis, lumbar region with neurogenic claudication: Secondary | ICD-10-CM

## 2018-01-18 DIAGNOSIS — M5416 Radiculopathy, lumbar region: Secondary | ICD-10-CM

## 2018-01-18 DIAGNOSIS — M961 Postlaminectomy syndrome, not elsewhere classified: Secondary | ICD-10-CM

## 2018-01-18 NOTE — Progress Notes (Signed)
Adrian Cox - 57 y.o. male MRN [id number]  Date of birth: [birth year]  Office Visit Note: Visit Date: 01/09/2018 PCP: Excell Seltzer, MD Referred by: Excell Seltzer, MD  Subjective: Chief Complaint  Patient presents with  . Lower Back - Pain  . Right Leg - Pain  . Left Leg - Pain   HPI: Adrian Cox is a 57 year old gentleman with history of prior lumbar surgery by Dr. Franky Macho now with stairstep listhesis of L4 on 5 and L5 on S1 with central canal lateral recess stenosis.  He comes in today for planned left L5 transforaminal injection but he is having really pain on both sides now both low back and radicular pain.  No new trauma or other issues.  We have given him a small amount of hydrocodone which she asked for refills for breakthrough pain.  He has had a history of financial issues in the past but he does have insurance now.  Because of those financial issues Dr. Sueanne Margarita office will not see him again.  This was many years ago but they are still not been a centimeter at this point.  I do think he needs surgical referral and will try to see if Dr. Otelia Sergeant can see him in our office.  If he cannot seen in a timely fashion we will try to get him to Dr. Venita Lick at [facility].  Mr. Whetsell is interested in surgical treatment as his pain has become fairly severe.  He is still trying to work.  He has not followed up with his primary care physician since we have seen him.  Today we did complete a diagnostic of a therapeutic bilateral L4 transforaminal epidural steroid injection.  Also Dr. Ermalene Searing who tried to start him on Lyrica and they were getting approval to get some aid in the cost of the medication.  He has brought the paperwork in for me today and I did fill it out.  We did write a prescription for Lyrica.   ROS Otherwise per HPI.  Assessment & Plan: Visit Diagnoses:  1. Lumbar radiculopathy   2. Spinal stenosis of lumbar region with neurogenic claudication   3. Post  laminectomy syndrome     Plan: No additional findings.   Meds & Orders:  Meds ordered this encounter  Medications  . betamethasone acetate-betamethasone sodium phosphate (CELESTONE) injection 12 mg  . pregabalin (LYRICA) 75 MG capsule    Sig: Take 1 capsule (75 mg total) by mouth 2 (two) times daily. Start at bedtime , increase to twice per day after 1 week if tolerated.    Dispense:  60 capsule    Refill:  6    Orders Placed This Encounter  Procedures  . XR C-ARM NO REPORT  . Epidural Steroid injection    Follow-up: Return if symptoms worsen or fail to improve.   Procedures: No procedures performed  Lumbosacral Transforaminal Epidural Steroid Injection - Sub-Pedicular Approach with Fluoroscopic Guidance  [name]      Date of Birth: 1961-07-28 MRN: [id number] PCP: Excell Seltzer, MD      Visit Date: 01/09/2018   Universal Protocol:    Date/Time: 01/09/2018  Consent Given By: the patient  Position: PRONE  Additional Comments: Vital signs were monitored before and after the procedure. Patient was prepped and draped in the usual sterile fashion. The correct patient, procedure, and site was verified.   Injection Procedure Details:  Procedure Site One Meds Administered:  Meds ordered this  encounter  Medications  . betamethasone acetate-betamethasone sodium phosphate (CELESTONE) injection 12 mg  . pregabalin (LYRICA) 75 MG capsule    Sig: Take 1 capsule (75 mg total) by mouth 2 (two) times daily. Start at bedtime , increase to twice per day after 1 week if tolerated.    Dispense:  60 capsule    Refill:  6    Laterality: Bilateral  Location/Site:  L4-L5  Needle size: 22 G  Needle type: Spinal  Needle Placement: Transforaminal  Findings:    -Comments: Excellent flow of contrast along the nerve and into the epidural space.  Procedure Details: After squaring off the end-plates to get a true AP view, the C-arm was positioned so that an  oblique view of the foramen as noted above was visualized. The target area is just inferior to the "nose of the scotty dog" or sub pedicular. The soft tissues overlying this structure were infiltrated with 2-3 ml. of 1% Lidocaine without Epinephrine.  The spinal needle was inserted toward the target using a "trajectory" view along the fluoroscope beam.  Under AP and lateral visualization, the needle was advanced so it did not puncture dura and was located close the 6 O'Clock position of the pedical in AP tracterory. Biplanar projections were used to confirm position. Aspiration was confirmed to be negative for CSF and/or blood. A 1-2 ml. volume of Isovue-250 was injected and flow of contrast was noted at each level. Radiographs were obtained for documentation purposes.   After attaining the desired flow of contrast documented above, a 0.5 to 1.0 ml test dose of 0.25% Marcaine was injected into each respective transforaminal space.  The patient was observed for 90 seconds post injection.  After no sensory deficits were reported, and normal lower extremity motor function was noted,   the above injectate was administered so that equal amounts of the injectate were placed at each foramen (level) into the transforaminal epidural space.   Additional Comments:  The patient tolerated the procedure well Dressing: Band-Aid    Post-procedure details: Patient was observed during the procedure. Post-procedure instructions were reviewed.  Patient left the clinic in stable condition.    Clinical History: MRI LUMBAR SPINE WITHOUT AND WITH CONTRAST  TECHNIQUE: Multiplanar and multiecho pulse sequences of the lumbar spine were obtained without and with intravenous contrast.  CONTRAST:  19mL MULTIHANCE GADOBENATE DIMEGLUMINE 529 MG/ML IV SOLN  COMPARISON:  Lumbar spine radiographs November 09, 2017  FINDINGS: SEGMENTATION: For the purposes of this report, the last well-formed intervertebral disc is  reported as L5-S1.  ALIGNMENT: Grade 1 L4-5 anterolisthesis and grade 1 L5-S1 retrolisthesis.  VERTEBRAE: Vertebral bodies are intact. Moderate L4-5, moderate to severe L5-S1 disc height loss with disc desiccation compatible with degenerative discs. Mild associated chronic discogenic endplate changes. No acute abnormal bone marrow signal. No suspicious osseous or disc enhancement. L2 superior endplate old Schmorl's node.  CONUS MEDULLARIS AND CAUDA EQUINA: Conus medullaris terminates at T12-L1 and demonstrates normal morphology and signal characteristics. Cauda equina is normal. No abnormal cord, leptomeningeal or epidural enhancement.  PARASPINAL AND OTHER SOFT TISSUES: Included prevertebral and paraspinal soft tissues are normal.  DISC LEVELS:  T12-L1, L1-2, L2-3: No disc bulge, canal stenosis nor neural foraminal narrowing.  L3-4: Annular bulging, small RIGHT extraforaminal disc protrusion. Minimal facet arthropathy and ligamentum flavum redundancy without canal stenosis. Mild RIGHT greater than LEFT neural foraminal narrowing.  L4-5: Anterolisthesis. Moderate broad-based disc bulge. Moderate to severe facet arthropathy with trace reactive effusions. Moderate to severe canal  stenosis. Moderate to severe RIGHT, moderate LEFT neural foraminal narrowing. Peripherally enhancing bilateral facet synovial cysts within bilateral paraspinal soft tissues measuring to 14 mm.  L5-S1: Status post LEFT laminectomy. Nonenhancing 7 x 11 x 12 mm cyst within laminectomy surgical bed. Retrolisthesis. Moderate RIGHT central disc protrusion with enhancing annular fissure/discectomy defect. No canal stenosis though, granulation tissue and narrowing the lateral recesses may affect the traversing S1 nerves. Minimal facet arthropathy. No canal stenosis.  IMPRESSION: 1. Status post LEFT L5 hemilaminectomy. Grade 1 L4-5 anterolisthesis and grade 1 L5-S1 retrolisthesis. No acute osseous  process. 2. 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 L5-S1 lateral recess stenosis and granulation tissue may affect the traversing S1 nerves. 3. Neural foraminal narrowing L3-4 through L5-S1: Moderate to severe on the RIGHT at L4-5.   Electronically Signed   By: Awilda Metro M.D.   On: 11/20/2017 22:39   He reports that he has never smoked. He has never used smokeless tobacco. No results for input(s): HGBA1C, LABURIC in the last 8760 hours.  Objective:  VS:  HT:    WT:   BMI:     BP:(!) 153/92  HR:68bpm  TEMP:97.8 F (36.6 C)(Oral)  RESP:97 % Physical Exam  Musculoskeletal:  Patient ambulates without aid with good distal strength with a forward flexed lumbar spine.    Ortho Exam Imaging: No results found.  Past Medical/Family/Surgical/Social History: Medications & Allergies reviewed per EMR, new medications updated. Patient Active Problem List   Diagnosis Date Noted  . Marijuana abuse, continuous 11/14/2017  . Acute exacerbation of chronic low back pain 11/09/2017  . Osteoarthritis of spine 02/23/2017  . Chronic right shoulder pain 01/09/2017  . Essential hypertension 08/17/2016  . Bipolar disorder (HCC) 08/17/2016  . Preventative health care 08/17/2016  . Hyperlipidemia 08/17/2016   Past Medical History:  Diagnosis Date  . Depression    bipolar  . Diverticulosis   . Hyperlipidemia   . Hypertension   . Mold exposure    Family History  Problem Relation Age of Onset  . Cancer Mother   . Colon cancer Mother 53   Past Surgical History:  Procedure Laterality Date  . BACK SURGERY    . CERVICAL SPINE SURGERY  2003   fusion  . KNEE ARTHROSCOPY Right   . KNEE ARTHROSCOPY Left   . SPINE SURGERY     Social History   Occupational History  . Not on file  Tobacco Use  . Smoking status: Never Smoker  . Smokeless tobacco: Never Used  Substance and Sexual Activity  . Alcohol use: No  . Drug use: Yes    Types: Marijuana    Comment: Hx marijuana use -  none x 2 yrs  . Sexual activity: Yes

## 2018-01-18 NOTE — Telephone Encounter (Signed)
Completed.

## 2018-01-18 NOTE — Progress Notes (Signed)
Referral placed to Dr. Venita Lickahari Brooks.  This is for possible spine surgery consultation.  We will try to send the patient to Dr. Franky Machoabbell who is seen in the remote past.  Evidently there was some issue with financial problems at the time and they will not see him back.  This seems not to be an issue now as he is working and he does have insurance.  We have asked Dr. Otelia SergeantNitka in our office to see him there is been some issue with trying to get notes from Dr. Jackelyn Knifeabell's office.  The patient hopefully can get more of a timely consultation.

## 2018-01-18 NOTE — Procedures (Signed)
Lumbosacral Transforaminal Epidural Steroid Injection - Sub-Pedicular Approach with Fluoroscopic Guidance  [name]      Date of Birth: [birth year] MRN: [id number] PCP: Excell SeltzerBedsole, Amy E, MD      Visit Date: 01/09/2018   Universal Protocol:    Date/Time: 01/09/2018  Consent Given By: the patient  Position: PRONE  Additional Comments: Vital signs were monitored before and after the procedure. Patient was prepped and draped in the usual sterile fashion. The correct patient, procedure, and site was verified.   Injection Procedure Details:  Procedure Site One Meds Administered:  Meds ordered this encounter  Medications  . betamethasone acetate-betamethasone sodium phosphate (CELESTONE) injection 12 mg  . pregabalin (LYRICA) 75 MG capsule    Sig: Take 1 capsule (75 mg total) by mouth 2 (two) times daily. Start at bedtime , increase to twice per day after 1 week if tolerated.    Dispense:  60 capsule    Refill:  6    Laterality: Bilateral  Location/Site:  L4-L5  Needle size: 22 G  Needle type: Spinal  Needle Placement: Transforaminal  Findings:    -Comments: Excellent flow of contrast along the nerve and into the epidural space.  Procedure Details: After squaring off the end-plates to get a true AP view, the C-arm was positioned so that an oblique view of the foramen as noted above was visualized. The target area is just inferior to the "nose of the scotty dog" or sub pedicular. The soft tissues overlying this structure were infiltrated with 2-3 ml. of 1% Lidocaine without Epinephrine.  The spinal needle was inserted toward the target using a "trajectory" view along the fluoroscope beam.  Under AP and lateral visualization, the needle was advanced so it did not puncture dura and was located close the 6 O'Clock position of the pedical in AP tracterory. Biplanar projections were used to confirm position. Aspiration was confirmed to be negative for CSF and/or  blood. A 1-2 ml. volume of Isovue-250 was injected and flow of contrast was noted at each level. Radiographs were obtained for documentation purposes.   After attaining the desired flow of contrast documented above, a 0.5 to 1.0 ml test dose of 0.25% Marcaine was injected into each respective transforaminal space.  The patient was observed for 90 seconds post injection.  After no sensory deficits were reported, and normal lower extremity motor function was noted,   the above injectate was administered so that equal amounts of the injectate were placed at each foramen (level) into the transforaminal epidural space.   Additional Comments:  The patient tolerated the procedure well Dressing: Band-Aid    Post-procedure details: Patient was observed during the procedure. Post-procedure instructions were reviewed.  Patient left the clinic in stable condition.

## 2018-01-23 ENCOUNTER — Ambulatory Visit (INDEPENDENT_AMBULATORY_CARE_PROVIDER_SITE_OTHER): Payer: Medicare HMO | Admitting: Physical Medicine and Rehabilitation

## 2018-01-25 ENCOUNTER — Telehealth (INDEPENDENT_AMBULATORY_CARE_PROVIDER_SITE_OTHER): Payer: Self-pay | Admitting: Physical Medicine and Rehabilitation

## 2018-01-25 ENCOUNTER — Telehealth (INDEPENDENT_AMBULATORY_CARE_PROVIDER_SITE_OTHER): Payer: Self-pay | Admitting: *Deleted

## 2018-01-25 ENCOUNTER — Other Ambulatory Visit (INDEPENDENT_AMBULATORY_CARE_PROVIDER_SITE_OTHER): Payer: Self-pay | Admitting: Physical Medicine and Rehabilitation

## 2018-01-25 MED ORDER — HYDROCODONE-ACETAMINOPHEN 5-325 MG PO TABS: 1.0000 | ORAL_TABLET | Freq: Two times a day (BID) | ORAL | 0 refills | Status: DC | PRN

## 2018-01-25 NOTE — Progress Notes (Signed)
Hydrocodone refilled for the patient.  Patient's 12 month [place] CSRS database history was reviewed and no inappropriate medication refills noted.  Patient reports that he did not get much relief from the last injection although it was better than the first.  We will then have him follow-up with Dr. Ophelia CharterYates.

## 2018-01-25 NOTE — Telephone Encounter (Signed)
Patient called wanting to schedule an appointment with Dr. Alvester MorinNewton, he states he's in severe pain. CB [phone]

## 2018-01-26 ENCOUNTER — Ambulatory Visit: Payer: Self-pay | Admitting: Family Medicine

## 2018-01-30 ENCOUNTER — Other Ambulatory Visit: Payer: Self-pay | Admitting: *Deleted

## 2018-01-30 ENCOUNTER — Encounter: Payer: Self-pay | Admitting: Family Medicine

## 2018-01-30 ENCOUNTER — Ambulatory Visit: Payer: Self-pay | Admitting: Family Medicine

## 2018-01-30 ENCOUNTER — Ambulatory Visit (INDEPENDENT_AMBULATORY_CARE_PROVIDER_SITE_OTHER): Payer: Medicare HMO | Admitting: Family Medicine

## 2018-01-30 VITALS — BP 130/76 | HR 90 | Temp 97.5°F | Ht 71.5 in | Wt 201.8 lb

## 2018-01-30 DIAGNOSIS — M5440 Lumbago with sciatica, unspecified side: Secondary | ICD-10-CM

## 2018-01-30 DIAGNOSIS — G8929 Other chronic pain: Secondary | ICD-10-CM | POA: Diagnosis not present

## 2018-01-30 MED ORDER — LIDOCAINE 5 % EX PTCH: 1.0000 | MEDICATED_PATCH | CUTANEOUS | 0 refills | Status: DC

## 2018-01-30 NOTE — Assessment & Plan Note (Signed)
Discussed again with pt that narcotics are not recommended for longterm treatment of chronic back pain. Pt continues to request narcotics but is agreeable to other treatments.   He has tried high dose gabapentin, NSAIDs, prednisone, PT and ESI x 2 without much relief.   He has upcoming referral for surgical consultation.   Recommended trial of lyrica in place of gabapentin . Will also add lidoderm patches for pain.  Continue home PT.

## 2018-01-30 NOTE — Progress Notes (Signed)
   Subjective:    Patient ID: Adrian Cox, male    DOB: 1961/08/27, 57 y.o.   MRN: [id number]  HPI  57 year old male presents for follow up on chronic back pain.. Lumbar radiculopathy  Pain in  Now more on right lower back radiates through to  Bilateral legs, tingling.  He feels legs are weak bilaterally.. Improves with walking. Worse with standing and sitting.  no new urinary incontinence.  Pain is 8-10/ 10 pain scale.  S/P oral prednisone in 10/2017  Has been seeing Dr. Alvester MorinNewton  PMR 12/11/2017 ESI, left S1 transforaminal  3/26/209 ESI  Pain improves with steroid injection but pain then returns 1 week later.  He has been to PT.  Referred to Dr.  Esmeralda LinksYates/ Brooks ? for spine surgery consideration  Has appt for4/30/2019  Cannot see Dr. Franky Machoabbell who he saw in past given financial issues, they refused to see him again.  He is using  Gabapentin 300 mg TID. 600 mg at bedtime  Did not help much.  Given rx for trial of lyrica.. He has not been able to afford yet.  Vicodin has not helped for pain at all. Pt request "anything that will make my pain improve until I can see surgeon" " oxycodone works best per pt.   Working at  [employer].  May have to quit given cannot do job he was hired for.  Review of Systems  Constitutional: Negative for fatigue and fever.  HENT: Negative for ear pain.   Eyes: Negative for pain.  Respiratory: Negative for cough and shortness of breath.   Cardiovascular: Negative for chest pain, palpitations and leg swelling.  Gastrointestinal: Negative for abdominal pain.  Genitourinary: Negative for dysuria.  Musculoskeletal: Positive for back pain. Negative for arthralgias.  Neurological: Negative for syncope, light-headedness and headaches.  Psychiatric/Behavioral: Negative for dysphoric mood.       Objective:   Physical Exam  Constitutional: He is oriented to person, place, and time. Vital signs are normal. He appears well-developed and well-nourished. No distress.    unable to get comfortable sitting, has to stand  HENT:  Head: Normocephalic.  Right Ear: Hearing normal.  Left Ear: Hearing normal.  Nose: Nose normal.  Mouth/Throat: Oropharynx is clear and moist and mucous membranes are normal.  Neck: Trachea normal. Carotid bruit is not present. No thyroid mass and no thyromegaly present.  Cardiovascular: Normal rate, regular rhythm and normal pulses. Exam reveals no gallop, no distant heart sounds and no friction rub.  No murmur heard. No peripheral edema  Pulmonary/Chest: Effort normal and breath sounds normal. No respiratory distress.  Musculoskeletal:       Lumbar back: He exhibits decreased range of motion, tenderness and bony tenderness.  ttp  over central vertebra from L1 to L5, ttp  bilateralt sciatic notches, SLR negative  Neurological: He is alert and oriented to person, place, and time. He has normal strength. He displays no atrophy. No cranial nerve deficit or sensory deficit. He exhibits normal muscle tone.  Skin: Skin is warm, dry and intact. No rash noted.  Psychiatric: He has a normal mood and affect. His speech is normal and behavior is normal. Thought content normal.   Difficult to understand speech.. Slightly slurred words and agitated.. But not any different from every time I have seen him.          Assessment & Plan:

## 2018-01-30 NOTE — Patient Instructions (Addendum)
Work on home physical therapy.  Walk as much as able.  Trial of lidoderm patches as needed for   When able to try trial of lyrica in place of gabapentin.

## 2018-01-31 ENCOUNTER — Telehealth: Payer: Self-pay | Admitting: *Deleted

## 2018-01-31 NOTE — Telephone Encounter (Signed)
PA for Lidocaine 5% patch denied.  Denial placed in Dr. Daphine DeutscherBedsole's in box for review.

## 2018-01-31 NOTE — Telephone Encounter (Signed)
Received fax from [facility] requesting PA for Lidcaine 5% patch.  PA completed on CoverMyMeds.  Sent for review.  Can take up to 72 hours for a decision.

## 2018-02-01 ENCOUNTER — Encounter: Payer: Self-pay | Admitting: Family Medicine

## 2018-02-07 ENCOUNTER — Institutional Professional Consult (permissible substitution) (INDEPENDENT_AMBULATORY_CARE_PROVIDER_SITE_OTHER): Payer: Self-pay | Admitting: Orthopaedic Surgery

## 2018-02-13 ENCOUNTER — Institutional Professional Consult (permissible substitution) (INDEPENDENT_AMBULATORY_CARE_PROVIDER_SITE_OTHER): Payer: Self-pay | Admitting: Orthopaedic Surgery

## 2018-02-15 ENCOUNTER — Encounter (INDEPENDENT_AMBULATORY_CARE_PROVIDER_SITE_OTHER): Payer: Self-pay | Admitting: *Deleted

## 2018-03-14 ENCOUNTER — Encounter: Payer: Self-pay | Admitting: Medical Oncology

## 2018-03-14 ENCOUNTER — Emergency Department
Admission: EM | Admit: 2018-03-14 | Discharge: 2018-03-14 | Disposition: A | Discharge status: Home / Self Care | Payer: Medicare HMO | Attending: Emergency Medicine | Admitting: Emergency Medicine

## 2018-03-14 DIAGNOSIS — Y939 Activity, unspecified: Secondary | ICD-10-CM | POA: Diagnosis not present

## 2018-03-14 DIAGNOSIS — S91032A Puncture wound without foreign body, left ankle, initial encounter: Secondary | ICD-10-CM | POA: Diagnosis not present

## 2018-03-14 DIAGNOSIS — Y929 Unspecified place or not applicable: Secondary | ICD-10-CM | POA: Diagnosis not present

## 2018-03-14 DIAGNOSIS — Z79899 Other long term (current) drug therapy: Secondary | ICD-10-CM | POA: Diagnosis not present

## 2018-03-14 DIAGNOSIS — S60872A Other superficial bite of left wrist, initial encounter: Secondary | ICD-10-CM | POA: Diagnosis not present

## 2018-03-14 DIAGNOSIS — S71151A Open bite, right thigh, initial encounter: Secondary | ICD-10-CM | POA: Diagnosis not present

## 2018-03-14 DIAGNOSIS — Z7982 Long term (current) use of aspirin: Secondary | ICD-10-CM | POA: Diagnosis not present

## 2018-03-14 DIAGNOSIS — W540XXA Bitten by dog, initial encounter: Secondary | ICD-10-CM | POA: Diagnosis not present

## 2018-03-14 DIAGNOSIS — I10 Essential (primary) hypertension: Secondary | ICD-10-CM | POA: Diagnosis not present

## 2018-03-14 DIAGNOSIS — Y998 Other external cause status: Secondary | ICD-10-CM | POA: Diagnosis not present

## 2018-03-14 DIAGNOSIS — S70371A Other superficial bite of right thigh, initial encounter: Secondary | ICD-10-CM | POA: Diagnosis not present

## 2018-03-14 DIAGNOSIS — S91052A Open bite, left ankle, initial encounter: Secondary | ICD-10-CM | POA: Diagnosis not present

## 2018-03-14 DIAGNOSIS — S50871A Other superficial bite of right forearm, initial encounter: Secondary | ICD-10-CM | POA: Insufficient documentation

## 2018-03-14 DIAGNOSIS — S51851A Open bite of right forearm, initial encounter: Secondary | ICD-10-CM | POA: Diagnosis not present

## 2018-03-14 MED ORDER — MELOXICAM 15 MG PO TABS: 15.0000 mg | ORAL_TABLET | Freq: Every day | ORAL | 0 refills | Status: AC

## 2018-03-14 MED ORDER — AMOXICILLIN-POT CLAVULANATE 875-125 MG PO TABS
1.0000 | ORAL_TABLET | Freq: Once | ORAL | Status: AC
  Administered 2018-03-14: 1 via ORAL
  Filled 2018-03-14: qty 1

## 2018-03-14 MED ORDER — OXYCODONE-ACETAMINOPHEN 5-325 MG PO TABS
1.0000 | ORAL_TABLET | Freq: Once | ORAL | Status: AC
  Administered 2018-03-14: 1 via ORAL
  Filled 2018-03-14: qty 1

## 2018-03-14 MED ORDER — AMOXICILLIN-POT CLAVULANATE 875-125 MG PO TABS: 1.0000 | ORAL_TABLET | Freq: Two times a day (BID) | ORAL | 0 refills | Status: AC

## 2018-03-14 NOTE — ED Notes (Signed)
See triage note  Stats he was bitten by a dog this afternoon  Puncture wounds noted right forearm,left wrist and right thigh area

## 2018-03-14 NOTE — ED Triage Notes (Signed)
Pt reports he was attacked by a boxer at his home. Pt knows owner of dog and dog is a rescue dog and has all shots UTD. Bit to rt upper thigh, left shin and rt wrist area.

## 2018-03-14 NOTE — ED Provider Notes (Signed)
[facility] Emergency Department Provider Note  ____________________________________________  Time seen: Approximately 5:26 PM  I have reviewed the triage vital signs and the nursing notes.   HISTORY  Chief Complaint Animal Bite    HPI Adrian Cox is a 57 y.o. male that presents to the emergency department for evaluation of dog bite. The dog was his neighbors boxer. Dog is up to date on all vaccines. He has wounds to his right roearm, left wrist, and right thigh. He has not taken anything for the pain. He had a tetanus shot within the last year.    Past Medical History:  Diagnosis Date  . Depression    bipolar  . Diverticulosis   . Hyperlipidemia   . Hypertension   . Mold exposure     Patient Active Problem List   Diagnosis Date Noted  . Marijuana abuse, continuous 11/14/2017  . Chronic low back pain with sciatica 11/09/2017  . Osteoarthritis of spine 02/23/2017  . Chronic right shoulder pain 01/09/2017  . Essential hypertension 08/17/2016  . Bipolar disorder (HCC) 08/17/2016  . Preventative health care 08/17/2016  . Hyperlipidemia 08/17/2016    Past Surgical History:  Procedure Laterality Date  . BACK SURGERY    . CERVICAL SPINE SURGERY  2003   fusion  . KNEE ARTHROSCOPY Right   . KNEE ARTHROSCOPY Left   . SPINE SURGERY      Prior to Admission medications   Medication Sig Start Date End Date Taking? Authorizing Provider  pregabalin (LYRICA) 150 MG capsule Take 150 mg by mouth 2 (two) times daily.   Yes [provider]  amoxicillin-clavulanate (AUGMENTIN) 875-125 MG tablet Take 1 tablet by mouth 2 (two) times daily for 10 days. 03/14/18 03/24/18  Enid Derry, PA-C  aspirin EC 81 MG tablet Take 81 mg by mouth daily.    [provider]  cholecalciferol (VITAMIN D) 1000 units tablet Take 1,000 Units by mouth daily.    [provider]  FIBER PO Take 4 tablets by mouth daily.    [provider]   gabapentin (NEURONTIN) 300 MG capsule TAKE 1 CAPSULE THREE TIMES DAILY AS NEEDED 12/26/17   Andrena Mews, DO  lidocaine (LIDODERM) 5 % Place 1-2 patches onto the skin daily. Remove & Discard patch within 12 hours or as directed by MD 01/30/18   Excell Seltzer, MD  meloxicam (MOBIC) 15 MG tablet Take 1 tablet (15 mg total) by mouth daily for 10 days. 03/14/18 03/24/18  Enid Derry, PA-C  Multiple Vitamin (MULTIVITAMIN WITH MINERALS) TABS tablet Take 1 tablet by mouth daily.    [provider]  omega-3 acid ethyl esters (LOVAZA) 1 g capsule Take 2 g by mouth daily.    [provider]    Allergies Patient has no known allergies.  Family History  Problem Relation Age of Onset  . Cancer Mother   . Colon cancer Mother 36    Social History Social History   Tobacco Use  . Smoking status: Never Smoker  . Smokeless tobacco: Never Used  Substance Use Topics  . Alcohol use: No  . Drug use: Yes    Types: Marijuana    Comment: Hx marijuana use - none x 2 yrs     Review of Systems  Respiratory:  No SOB. Gastrointestinal: No nausea, no vomiting.  Musculoskeletal: Positive for arm and leg pain.  Skin: Negative for rash, ecchymosis. Positive for lacerations.   ____________________________________________   PHYSICAL EXAM:  VITAL SIGNS:  ED Triage Vitals [03/14/18 1702]  Enc Vitals Group     BP 123/64     Pulse Rate 79     Resp 18     Temp 98.4 F (36.9 C)     Temp Source Oral     SpO2 98 %     Weight 199 lb (90.3 kg)     Height  (1.803 m)     Head Circumference      Peak Flow      Pain Score 8     Pain Loc      Pain Edu?      Excl. in GC?      Constitutional: Alert and oriented. Well appearing and in no acute distress. Eyes: Conjunctivae are normal. PERRL. EOMI. Head: Atraumatic. ENT:      Ears:      Nose: No congestion/rhinnorhea.      Mouth/Throat: Mucous membranes are moist.  Neck: No stridor. Cardiovascular: Normal rate, regular  rhythm.  Good peripheral circulation. Respiratory: Normal respiratory effort without tachypnea or retractions. Lungs CTAB. Good air entry to the bases with no decreased or absent breath sounds. Musculoskeletal: Full range of motion to all extremities. No gross deformities appreciated. Neurologic:  Normal speech and language. No gross focal neurologic deficits are appreciated.  Skin:  Skin is warm, dry. Scratches and punctures to right thigh. Puncture to left ankle. Puncture to right forearm.    ____________________________________________   LABS (all labs ordered are listed, but only abnormal results are displayed)  Labs Reviewed - No data to display ____________________________________________  EKG   ____________________________________________  RADIOLOGY  No results found.  ____________________________________________    PROCEDURES  Procedure(s) performed:    Procedures    Medications  oxyCODONE-acetaminophen (PERCOCET/ROXICET) 5-325 MG per tablet 1 tablet (1 tablet Oral Given 03/14/18 1812)  amoxicillin-clavulanate (AUGMENTIN) 875-125 MG per tablet 1 tablet (1 tablet Oral Given 03/14/18 1812)     ____________________________________________   INITIAL IMPRESSION / ASSESSMENT AND PLAN / ED COURSE  Pertinent labs & imaging results that were available during my care of the patient were reviewed by me and considered in my medical decision making (see chart for details).  Review of the [place] CSRS was performed in accordance of the [facility] prior to dispensing any controlled drugs.   Patient's diagnosis is consistent with animal bite.  Rabies vaccinations are up-to-date.  Patient's tetanus is up-to-date.  Patient will be discharged home with prescriptions for Augmentin. Patient is to follow up with PCP as directed. Patient is given ED precautions to return to the ED for any worsening or new symptoms.     ____________________________________________  FINAL CLINICAL  IMPRESSION(S) / ED DIAGNOSES  Final diagnoses:  Dog bite, initial encounter      NEW MEDICATIONS STARTED DURING THIS VISIT:  ED Discharge Orders        Ordered    amoxicillin-clavulanate (AUGMENTIN) 875-125 MG tablet  2 times daily     03/14/18 1756    meloxicam (MOBIC) 15 MG tablet  Daily     03/14/18 1756          This chart was dictated using voice recognition software/Dragon. Despite best efforts to proofread, errors can occur which can change the meaning. Any change was purely unintentional.    Enid Derry, PA-C 03/14/18 1953    Loleta Rose, MD 03/15/18 [phone]

## 2018-03-15 ENCOUNTER — Encounter: Payer: Self-pay | Admitting: Family Medicine

## 2018-03-15 ENCOUNTER — Ambulatory Visit (INDEPENDENT_AMBULATORY_CARE_PROVIDER_SITE_OTHER): Payer: Medicare HMO | Admitting: Family Medicine

## 2018-03-15 ENCOUNTER — Encounter: Payer: Self-pay | Admitting: *Deleted

## 2018-03-15 VITALS — BP 146/82 | HR 70 | Temp 97.6°F | Ht 71.5 in | Wt 199.5 lb

## 2018-03-15 DIAGNOSIS — W540XXA Bitten by dog, initial encounter: Secondary | ICD-10-CM | POA: Diagnosis not present

## 2018-03-15 DIAGNOSIS — S70371A Other superficial bite of right thigh, initial encounter: Secondary | ICD-10-CM | POA: Diagnosis not present

## 2018-03-15 MED ORDER — CEFTRIAXONE SODIUM 1 G IJ SOLR
1.0000 g | Freq: Once | INTRAMUSCULAR | Status: AC
  Administered 2018-03-15: 1 g via INTRAMUSCULAR

## 2018-03-15 NOTE — Progress Notes (Signed)
Dr. Karleen Hampshire T. Tequia Wolman, MD, CAQ Sports Medicine [facility] [address] [place], [address] Phone: [phone] Fax: [phone]  03/15/2018  [name], MRN: [id number], DOB: [birth year], 57 y.o.  Primary Physician:  Excell Seltzer, MD   Chief Complaint  Patient presents with  . Animal Bite    Right Inner Thigh   Subjective:   Adrian Cox is a 57 y.o. very pleasant male patient who presents with the following:  Dog bite, DOI 03/14/2018  He describes an incident where his neighbors dog, who is a boxer bit primarily his right inner thigh, and he had some bleeding as well as some injury to the soft tissue now he is having some pain.  He did go to the emergency room last night.  His tetanus is up-to-date.  They gave him a prescription of some Augmentin, but he wanted to follow-up with [place] today for a recheck.  The dog is also in the custody of animal control, and it already was up-to-date in terms of its rabies vaccination.  Placed on Augmentin.   Give a shot of IM Rocephin.  Immunization History  Administered Date(s) Administered  . Influenza-Unspecified 07/08/2016, 06/04/2017  . Tdap 11/14/2017     Past Medical History, Surgical History, Social History, Family History, Problem List, Medications, and Allergies have been reviewed and updated if relevant.  Patient Active Problem List   Diagnosis Date Noted  . Marijuana abuse, continuous 11/14/2017  . Chronic low back pain with sciatica 11/09/2017  . Osteoarthritis of spine 02/23/2017  . Chronic right shoulder pain 01/09/2017  . Essential hypertension 08/17/2016  . Bipolar disorder (HCC) 08/17/2016  . Preventative health care 08/17/2016  . Hyperlipidemia 08/17/2016    Past Medical History:  Diagnosis Date  . Depression    bipolar  . Diverticulosis   . Hyperlipidemia   . Hypertension   . Mold exposure     Past Surgical History:  Procedure Laterality Date  . BACK  SURGERY    . CERVICAL SPINE SURGERY  2003   fusion  . KNEE ARTHROSCOPY Right   . KNEE ARTHROSCOPY Left   . SPINE SURGERY      Social History   Socioeconomic History  . Marital status: Legally Separated    Spouse name: Not on file  . Number of children: Not on file  . Years of education: Not on file  . Highest education level: Not on file  Occupational History  . Not on file  Social Needs  . Financial resource strain: Not on file  . Food insecurity:    Worry: Not on file    Inability: Not on file  . Transportation needs:    Medical: Not on file    Non-medical: Not on file  Tobacco Use  . Smoking status: Never Smoker  . Smokeless tobacco: Never Used  Substance and Sexual Activity  . Alcohol use: No  . Drug use: Yes    Types: Marijuana    Comment: Hx marijuana use - none x 2 yrs  . Sexual activity: Yes  Lifestyle  . Physical activity:    Days per week: Not on file    Minutes per session: Not on file  . Stress: Not on file  Relationships  . Social connections:    Talks on phone: Not on file    Gets together: Not on file    Attends religious service: Not on file    Active member of club  or organization: Not on file    Attends meetings of clubs or organizations: Not on file    Relationship status: Not on file  . Intimate partner violence:    Fear of current or ex partner: Not on file    Emotionally abused: Not on file    Physically abused: Not on file    Forced sexual activity: Not on file  Other Topics Concern  . Not on file  Social History Narrative   Patient is married, has no children. He does not smoke drink or use any substances. He has a high school education. He is currently not employed. He does some walking, but really does not get a lot of regular exercise. He has membership at [employer] but he doesn't use it    Family History  Problem Relation Age of Onset  . Cancer Mother   . Colon cancer Mother 48    No Known Allergies  Medication list  reviewed and updated in full in [facility].   GEN: No acute illnesses, no fevers, chills. GI: No n/v/d, eating normally Pulm: No SOB Interactive and getting along well at home.  Otherwise, ROS is as per the HPI.  Objective:   BP (!) 146/82   Pulse 70   Temp 97.6 F (36.4 C) (Oral)   Ht 5' 11.5" (1.816 m)   Wt 199 lb 8 oz (90.5 kg)   BMI 27.44 kg/m   GEN: WDWN, NAD, Non-toxic, A & O x 3 HEENT: Atraumatic, Normocephalic. Neck supple. No masses, No LAD. Ears and Nose: No external deformity. EXTR: No c/c/e NEURO Normal gait.  PSYCH: Normally interactive. Conversant. Not depressed or anxious appearing.  Calm demeanor.   On the right inner thigh, the patient does have some evidence of healing tooth marks as well as some tenderness around the soft tissue.  There is no active bleeding anywhere.  There is also some minor scratches on the patient's arm as well as his left leg anteriorly.  Laboratory and Imaging Data:  Assessment and Plan:   Dog bite, initial encounter - Plan: cefTRIAXone (ROCEPHIN) injection 1 g  All appropriate care has been undertaken.  He will be unable to get his Augmentin for at least one day, so I am going to give him a shot of Rocephin in the office.  I reassured him regarding his injuries, and would anticipate there will heal, but he should fill his prescription for Augmentin.  Follow-up: No follow-ups on file.  Meds ordered this encounter  Medications  . cefTRIAXone (ROCEPHIN) injection 1 g    Order Specific Question:   Antibiotic Indication:    Answer:   Other Indication (list below)    Order Specific Question:   Other Indication:    Answer:   Dog Bite   Signed,  Jamine Highfill T. Jamarie Mussa, MD   Allergies as of 03/15/2018   No Known Allergies     Medication List        Accurate as of 03/15/18  9:44 AM. Always use your most recent med list.          amoxicillin-clavulanate 875-125 MG tablet Commonly known as:  AUGMENTIN Take 1 tablet by  mouth 2 (two) times daily for 10 days.   aspirin EC 81 MG tablet Take 81 mg by mouth daily.   cholecalciferol 1000 units tablet Commonly known as:  VITAMIN D Take 1,000 Units by mouth daily.   FIBER PO Take 4 tablets by mouth daily.   lidocaine 5 %  Commonly known as:  LIDODERM Place 1-2 patches onto the skin daily. Remove & Discard patch within 12 hours or as directed by MD   meloxicam 15 MG tablet Commonly known as:  MOBIC Take 1 tablet (15 mg total) by mouth daily for 10 days.   multivitamin with minerals Tabs tablet Take 1 tablet by mouth daily.   omega-3 acid ethyl esters 1 g capsule Commonly known as:  LOVAZA Take 2 g by mouth daily.   pregabalin 150 MG capsule Commonly known as:  LYRICA Take 150 mg by mouth 2 (two) times daily.

## 2018-03-16 ENCOUNTER — Ambulatory Visit: Payer: Self-pay | Admitting: *Deleted

## 2018-03-16 NOTE — Telephone Encounter (Signed)
Noted. Appt 6/4 is appropriate as no sign of infection per pt. Also pt on antibitoics prophylactically

## 2018-03-16 NOTE — Telephone Encounter (Signed)
Pt has appt on 03/20/18 at 9:45 with Dr Ermalene Searing.

## 2018-03-16 NOTE — Telephone Encounter (Signed)
Pt called with a dog bite that happened on Wednesday. He went to the ED on that day and to see Dr. Patsy Lager on Thursday. He received an antibiotic injection and was given a prescription for them to be picked up today.  He denies fever, or drainage from bite. Does not look infected. He states it feels like blood is running down his leg. There is not bleeding at the site.  No protocol noted. Appointment made for Monday per pt request.  Home care advice given to him with verbal understanding. Will route to flow at [facility] at [facility]  Answer Assessment - Initial Assessment Questions 1. ANIMAL: "What type of animal caused the bite?" "Is the injury from a bite or a claw?" If the animal is a dog or a cat, ask: "Was it a pet or a stray?" "Was it acting ill or behaving strangely?"     Dog bite, a pet and not behaving strangely 2. LOCATION: "Where is the bite located?"      Right thigh 3. SIZE: "How big is the bite?" "What does it look like?"      Large, swollen and can see teeth marks and bruised 4. ONSET: "When did the bite happen?" (Minutes or hours ago)      Wednesday 5. CIRCUMSTANCES: "Tell me how this happened."      Dog just came out behind some neighbors and dog looked at him and started attacking 6. TETANUS: "When was the last tetanus booster?"     January this year 7. PREGNANCY: "Is there any chance you are pregnant?" "When was your last menstrual period?"     n/a  Protocols used: ANIMAL BITE-A-AH

## 2018-03-18 ENCOUNTER — Other Ambulatory Visit: Payer: Self-pay

## 2018-03-18 ENCOUNTER — Emergency Department
Admission: EM | Admit: 2018-03-18 | Discharge: 2018-03-19 | Disposition: A | Discharge status: Home / Self Care | Payer: Medicare HMO | Attending: Emergency Medicine | Admitting: Emergency Medicine

## 2018-03-18 ENCOUNTER — Encounter: Payer: Self-pay | Admitting: Emergency Medicine

## 2018-03-18 DIAGNOSIS — Z5321 Procedure and treatment not carried out due to patient leaving prior to being seen by health care provider: Secondary | ICD-10-CM | POA: Diagnosis not present

## 2018-03-18 DIAGNOSIS — R42 Dizziness and giddiness: Secondary | ICD-10-CM | POA: Diagnosis not present

## 2018-03-18 LAB — URINALYSIS, COMPLETE (UACMP) WITH MICROSCOPIC
BILIRUBIN URINE: NEGATIVE
Bacteria, UA: NONE SEEN
Glucose, UA: NEGATIVE mg/dL
Ketones, ur: NEGATIVE mg/dL
LEUKOCYTES UA: NEGATIVE
Nitrite: NEGATIVE
Protein, ur: NEGATIVE mg/dL
SPECIFIC GRAVITY, URINE: 1.016 (ref 1.005–1.030)
pH: 6 (ref 5.0–8.0)

## 2018-03-18 LAB — BASIC METABOLIC PANEL
Anion gap: 9 (ref 5–15)
BUN: 11 mg/dL (ref 6–20)
CHLORIDE: 106 mmol/L (ref 101–111)
CO2: 25 mmol/L (ref 22–32)
CREATININE: 0.73 mg/dL (ref 0.61–1.24)
Calcium: 9 mg/dL (ref 8.9–10.3)
GFR calc Af Amer: >60 mL/min (ref >60)
GFR calc non Af Amer: >60 mL/min (ref >60)
GLUCOSE: 97 mg/dL (ref 65–99)
POTASSIUM: 3.7 mmol/L (ref 3.5–5.1)
Sodium: 140 mmol/L (ref 135–145)

## 2018-03-18 LAB — GLUCOSE, CAPILLARY: GLUCOSE-CAPILLARY: 79 mg/dL (ref 65–99)

## 2018-03-18 LAB — CBC
HCT: 37 % — ABNORMAL LOW (ref 40.0–52.0)
Hemoglobin: 12.7 g/dL — ABNORMAL LOW (ref 13.0–18.0)
MCH: 34 pg (ref 26.0–34.0)
MCHC: 34.3 g/dL (ref 32.0–36.0)
MCV: 99 fL (ref 80.0–100.0)
PLATELETS: 204 10*3/uL (ref 150–440)
RBC: 3.74 MIL/uL — ABNORMAL LOW (ref 4.40–5.90)
RDW: 12.8 % (ref 11.5–14.5)
WBC: 11.6 10*3/uL — AB (ref 3.8–10.6)

## 2018-03-18 LAB — TROPONIN I

## 2018-03-18 NOTE — ED Triage Notes (Signed)
Pt arrives POV to triage with c/o dizziness due to a dog bite which he sustained on Wednesday. Pt has already found information on dog's rabies vaccine. Pt states that "something don't feel right". Pt is in NAD.

## 2018-03-19 ENCOUNTER — Other Ambulatory Visit: Payer: Self-pay | Admitting: *Deleted

## 2018-03-20 ENCOUNTER — Ambulatory Visit (INDEPENDENT_AMBULATORY_CARE_PROVIDER_SITE_OTHER): Payer: Medicare HMO | Admitting: Family Medicine

## 2018-03-20 ENCOUNTER — Encounter: Payer: Self-pay | Admitting: Family Medicine

## 2018-03-20 DIAGNOSIS — S71151D Open bite, right thigh, subsequent encounter: Secondary | ICD-10-CM | POA: Diagnosis not present

## 2018-03-20 DIAGNOSIS — W540XXS Bitten by dog, sequela: Principal | ICD-10-CM

## 2018-03-20 DIAGNOSIS — W540XXD Bitten by dog, subsequent encounter: Secondary | ICD-10-CM

## 2018-03-20 DIAGNOSIS — S71151S Open bite, right thigh, sequela: Secondary | ICD-10-CM | POA: Diagnosis not present

## 2018-03-20 NOTE — Patient Instructions (Addendum)
Complete course of antibiotics.  Ice and elevate area as much as able.  Can use meloxicam for pain.  Remain out of work until 6/10 given pain in right inner thigh from dog bite and damage/swelling to thigh muscle.  Avoid squatting and prolonged standing. Remain out of gym x 1 week.

## 2018-03-20 NOTE — Assessment & Plan Note (Signed)
Significant swelling, soft tissue damage at inner thigh. Ice, elevate. No clear sign of infection. Complete prophylactic antibitoics Given job with [employer] is very labor intensive on feet... Remain out of work until 6/10, stop exercise, avoid squatting.

## 2018-03-20 NOTE — Progress Notes (Signed)
   Subjective:    Patient ID: Adrian Cox, male    DOB: 08/29/1961, 57 y.o.   MRN: [id number]  HPI   57 year old male presents following dog bite  1 week ago.   He was seen  In ED on 5/29 The dog was his neighbors boxer. Dog is up to date on all vaccines. He has wounds to his right roearm, left wrist, and right thigh.  He had a tetanus shot within the last year.    Started on augmentin prophylactically  Followed up with Dr. Salena Saner on 5/30 Given  IM Rocephin since  Was not able to start augmentin initially.  Returned to ED for dizziness, nausea on 03/18/2018: glucose 79, BMET nml, wbc 11.6  Hg 12.7  UA clear  toponin I neg  EKG stable from 2017 Left without being seen.   Today he reports swelling over bite, soreness of muscle, no discharge no associated redness.  Has some diarrhea with Augmentin.  No fever.  He is still having pain in inner thigh when standing and moving, stitting.   Review of Systems  Constitutional: Negative for fatigue and fever.  HENT: Negative for ear pain.   Eyes: Negative for pain.  Respiratory: Negative for cough and shortness of breath.   Cardiovascular: Negative for chest pain, palpitations and leg swelling.  Gastrointestinal: Negative for abdominal pain.  Genitourinary: Negative for dysuria.  Musculoskeletal: Negative for arthralgias.  Neurological: Negative for syncope, light-headedness and headaches.  Psychiatric/Behavioral: Negative for dysphoric mood.       Objective:   Physical Exam  Constitutional: Vital signs are normal. He appears well-developed and well-nourished.  HENT:  Head: Normocephalic.  Right Ear: Hearing normal.  Left Ear: Hearing normal.  Nose: Nose normal.  Mouth/Throat: Oropharynx is clear and moist and mucous membranes are normal.  Neck: Trachea normal. Carotid bruit is not present. No thyroid mass and no thyromegaly present.  Cardiovascular: Normal rate, regular rhythm and normal pulses. Exam reveals no gallop, no  distant heart sounds and no friction rub.  No murmur heard. No peripheral edema  Pulmonary/Chest: Effort normal and breath sounds normal. No respiratory distress.  Skin: Skin is warm, dry and intact. No rash noted.  Healing puncture wounds on right inner thigh ,left ankle and left arm  Right inner thigh without redness, no discharge, soreness to palpation of inner thigh and significant swelling of soft tissue.  Pulses in bilateral ankles nml and sensation in LE intact.  Psychiatric: He has a normal mood and affect. His speech is normal and behavior is normal. Thought content normal.          Assessment & Plan:

## 2018-03-21 ENCOUNTER — Other Ambulatory Visit: Payer: Self-pay | Admitting: Family Medicine

## 2018-03-23 ENCOUNTER — Ambulatory Visit: Payer: Self-pay | Admitting: Psychology

## 2018-03-25 ENCOUNTER — Encounter: Payer: Self-pay | Admitting: Family Medicine

## 2018-03-27 ENCOUNTER — Encounter: Payer: Self-pay | Admitting: Family Medicine

## 2018-03-27 ENCOUNTER — Ambulatory Visit (INDEPENDENT_AMBULATORY_CARE_PROVIDER_SITE_OTHER): Payer: Medicare HMO | Admitting: Family Medicine

## 2018-03-27 VITALS — BP 150/80 | HR 70 | Temp 97.7°F | Ht 71.5 in | Wt 212.5 lb

## 2018-03-27 DIAGNOSIS — W540XXS Bitten by dog, sequela: Secondary | ICD-10-CM | POA: Diagnosis not present

## 2018-03-27 DIAGNOSIS — M25561 Pain in right knee: Secondary | ICD-10-CM

## 2018-03-27 DIAGNOSIS — S71151S Open bite, right thigh, sequela: Secondary | ICD-10-CM

## 2018-03-27 DIAGNOSIS — M79651 Pain in right thigh: Secondary | ICD-10-CM | POA: Insufficient documentation

## 2018-03-27 MED ORDER — DICLOFENAC SODIUM 75 MG PO TBEC: 75.0000 mg | DELAYED_RELEASE_TABLET | Freq: Two times a day (BID) | ORAL | 0 refills | Status: DC

## 2018-03-27 NOTE — Assessment & Plan Note (Signed)
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  Note given to remain out of work unitl [facility].

## 2018-03-27 NOTE — Assessment & Plan Note (Signed)
Possible acute knee injury, positive MCMurrray makes meniscal injury possible,  Pain  In knee seem to come few weeks after initial injury. ? Due to abnormal gait given injury to muscle in right medial thigh.

## 2018-03-27 NOTE — Progress Notes (Signed)
   Subjective:    Patient ID: Adrian Cox, male    DOB: 27-Dec-1960, 57 y.o.   MRN: [id number]  HPI   57 year old male presents for follow up on dog bite to right upper thigh. Occurred on 5/29  He is now 2 weeks out from injury.  S/P prophylactic antibiotics.  Ice, elevation.  He reports no pain with lying down.  he has sharp  Pains in right upper thigh when standing or walking.  Pains and needles in right  Upper leg and knee. Now with new pain in right lateral knee.  No weakness. Minimal swelling in thigh in AM.. gradaully gets worse as up on feet during.. Right inner thigh becomes firm. He feels like it effects his walking ,  He has been trying to elevate and ice area.   He has improvement in left radicular leg pain with lyrica.       Review of Systems  Constitutional: Negative for fatigue and fever.  HENT: Negative for ear pain.   Eyes: Negative for pain.  Respiratory: Negative for cough and shortness of breath.   Cardiovascular: Negative for chest pain, palpitations and leg swelling.  Gastrointestinal: Negative for abdominal pain.  Genitourinary: Negative for dysuria.  Musculoskeletal: Positive for arthralgias and gait problem.  Neurological: Positive for numbness. Negative for syncope, light-headedness and headaches.  Psychiatric/Behavioral: Negative for dysphoric mood.       Objective:   Physical Exam  Constitutional: Vital signs are normal. He appears well-developed and well-nourished.  HENT:  Head: Normocephalic.  Right Ear: Hearing normal.  Left Ear: Hearing normal.  Nose: Nose normal.  Mouth/Throat: Oropharynx is clear and moist and mucous membranes are normal.  Neck: Trachea normal. Carotid bruit is not present. No thyroid mass and no thyromegaly present.  Cardiovascular: Normal rate, regular rhythm and normal pulses. Exam reveals no gallop, no distant heart sounds and no friction rub.  No murmur heard. No peripheral edema  Pulmonary/Chest: Effort  normal and breath sounds normal. No respiratory distress.  Skin: Skin is warm, dry and intact. No rash noted.  Healing puncture wounds on right inner thigh ,left ankle   Right inner thigh without redness, no discharge, soreness to palpation of inner thigh and significant swelling of soft tissue. Tissue firm  In areas in irregular pattern No numbness, no muscle weakness, but pain with adduction of hip  Diffuse swelling 1 plus pitting edema in entire right leg ankle to thigh Pulses in bilateral ankles nml and sensation in LE intact.  Psychiatric: He has a normal mood and affect. His speech is normal and behavior is normal. Thought content normal.          Assessment & Plan:

## 2018-03-27 NOTE — Assessment & Plan Note (Signed)
Continue significant pain in right medial thigh as well as swelling and evidence of deep muscle injury. Possible hematoma.  Likely nerve injury as well given pins and needles.  Will  change to diclofenac twice daily for pain and inflammation and [facility] thigh muscle possibly with US.

## 2018-03-27 NOTE — Patient Instructions (Signed)
Start diclofenac 75 mg twice daily for pain and inflammation. Please stop at the front desk to set up referral to orthopedics.

## 2018-03-29 DIAGNOSIS — M25561 Pain in right knee: Secondary | ICD-10-CM | POA: Diagnosis not present

## 2018-04-17 DIAGNOSIS — M25561 Pain in right knee: Secondary | ICD-10-CM | POA: Diagnosis not present

## 2018-04-18 ENCOUNTER — Other Ambulatory Visit: Payer: Self-pay | Admitting: Family Medicine

## 2018-04-18 NOTE — Telephone Encounter (Addendum)
Last office visit 03/27/2018 for dog bite.  Last refilled 01/09/2018 for #60 with 6 refills.  Medication list was change at ER to  Lyrica 150 mg twice a day.  Please advise??

## 2018-04-20 ENCOUNTER — Other Ambulatory Visit: Payer: Self-pay | Admitting: Family Medicine

## 2018-04-23 ENCOUNTER — Encounter: Payer: Self-pay | Admitting: Family Medicine

## 2018-05-04 NOTE — Telephone Encounter (Signed)
error 

## 2018-05-06 ENCOUNTER — Other Ambulatory Visit: Payer: Self-pay

## 2018-05-06 ENCOUNTER — Emergency Department: Payer: Medicare HMO

## 2018-05-06 ENCOUNTER — Emergency Department
Admission: EM | Admit: 2018-05-06 | Discharge: 2018-05-06 | Disposition: A | Discharge status: Home / Self Care | Payer: Medicare HMO | Attending: Emergency Medicine | Admitting: Emergency Medicine

## 2018-05-06 ENCOUNTER — Encounter: Payer: Self-pay | Admitting: Emergency Medicine

## 2018-05-06 DIAGNOSIS — S81851S Open bite, right lower leg, sequela: Secondary | ICD-10-CM | POA: Diagnosis not present

## 2018-05-06 DIAGNOSIS — Z79899 Other long term (current) drug therapy: Secondary | ICD-10-CM | POA: Diagnosis not present

## 2018-05-06 DIAGNOSIS — M792 Neuralgia and neuritis, unspecified: Secondary | ICD-10-CM | POA: Insufficient documentation

## 2018-05-06 DIAGNOSIS — I10 Essential (primary) hypertension: Secondary | ICD-10-CM | POA: Diagnosis not present

## 2018-05-06 DIAGNOSIS — Z7982 Long term (current) use of aspirin: Secondary | ICD-10-CM | POA: Insufficient documentation

## 2018-05-06 DIAGNOSIS — R52 Pain, unspecified: Secondary | ICD-10-CM

## 2018-05-06 DIAGNOSIS — M25562 Pain in left knee: Secondary | ICD-10-CM | POA: Insufficient documentation

## 2018-05-06 DIAGNOSIS — W540XXA Bitten by dog, initial encounter: Secondary | ICD-10-CM | POA: Diagnosis not present

## 2018-05-06 DIAGNOSIS — M25561 Pain in right knee: Secondary | ICD-10-CM | POA: Diagnosis not present

## 2018-05-06 DIAGNOSIS — T148XXA Other injury of unspecified body region, initial encounter: Secondary | ICD-10-CM

## 2018-05-06 MED ORDER — LIDOCAINE 5 % EX PTCH: 1.0000 | MEDICATED_PATCH | CUTANEOUS | 0 refills | Status: DC

## 2018-05-06 MED ORDER — KETOROLAC TROMETHAMINE 30 MG/ML IJ SOLN
30.0000 mg | Freq: Once | INTRAMUSCULAR | Status: AC
  Administered 2018-05-06: 30 mg via INTRAMUSCULAR
  Filled 2018-05-06: qty 1

## 2018-05-06 MED ORDER — LIDOCAINE 5 % EX PTCH
1.0000 | MEDICATED_PATCH | CUTANEOUS | Status: DC
  Administered 2018-05-06: 1 via TRANSDERMAL
  Filled 2018-05-06: qty 1

## 2018-05-06 MED ORDER — KETOROLAC TROMETHAMINE 10 MG PO TABS: 10.0000 mg | ORAL_TABLET | Freq: Four times a day (QID) | ORAL | 0 refills | Status: DC | PRN

## 2018-05-06 NOTE — ED Provider Notes (Signed)
[facility] Emergency Department Provider Note  ____________________________________________  Time seen: Approximately 7:40 AM  I have reviewed the triage vital signs and the nursing notes.   HISTORY  Chief Complaint Knee Pain    HPI Adrian Cox is a 57 y.o. male that presents to the emergency department for pain control after dog bite 2 months ago.  Patient was bitten in right leg 2 months ago.  He took a course of  Augmentin.  He continues to have shooting pain from the bite that radiates to his knee and back up into his thigh.  Symptoms have been present for 2 months.  He is seeing Dr. Odis Luster for concern.  He was given a knee brace.  He has plans for an MRI for nerve damage but is trying to get it improved with insurance.  He has a follow-up appointment with Dr. Odis Luster on the 31st.  He presents today for pain control and a work note.  He plans to make an appointment with primary care this week but they are not open today.   Past Medical History:  Diagnosis Date  . Depression    bipolar  . Diverticulosis   . Hyperlipidemia   . Hypertension   . Mold exposure     Patient Active Problem List   Diagnosis Date Noted  . Pain in right thigh 03/27/2018  . Lateral knee pain, right 03/27/2018  . Dog bite of thigh without complication, right, sequela 03/20/2018  . Marijuana abuse, continuous 11/14/2017  . Chronic low back pain with sciatica 11/09/2017  . Osteoarthritis of spine 02/23/2017  . Chronic right shoulder pain 01/09/2017  . Essential hypertension 08/17/2016  . Bipolar disorder (HCC) 08/17/2016  . Preventative health care 08/17/2016  . Hyperlipidemia 08/17/2016    Past Surgical History:  Procedure Laterality Date  . BACK SURGERY    . CERVICAL SPINE SURGERY  2003   fusion  . KNEE ARTHROSCOPY Right   . KNEE ARTHROSCOPY Left   . SPINE SURGERY      Prior to Admission medications   Medication Sig Start Date End Date Taking? Authorizing  Provider  aspirin EC 81 MG tablet Take 81 mg by mouth daily.    [provider]  cholecalciferol (VITAMIN D) 1000 units tablet Take 1,000 Units by mouth daily.    [provider]  diclofenac (VOLTAREN) 75 MG EC tablet Take 1 tablet (75 mg total) by mouth 2 (two) times daily. 03/27/18   Bedsole, Amy E, MD  FIBER PO Take 4 tablets by mouth daily.    [provider]  ketorolac (TORADOL) 10 MG tablet Take 1 tablet (10 mg total) by mouth every 6 (six) hours as needed. 05/06/18   Enid Derry, PA-C  lidocaine (LIDODERM) 5 % Place 1 patch onto the skin daily. Remove & Discard patch within 12 hours or as directed by MD 05/06/18   Enid Derry, PA-C  Multiple Vitamin (MULTIVITAMIN WITH MINERALS) TABS tablet Take 1 tablet by mouth daily.    [provider]  omega-3 acid ethyl esters (LOVAZA) 1 g capsule Take 2 g by mouth daily.    [provider]  pregabalin (LYRICA) 150 MG capsule Take 150 mg by mouth 2 (two) times daily.    [provider]  pregabalin (LYRICA) 150 MG capsule Take 1 capsule (150 mg total) by mouth 2 (two) times daily. 04/18/18   Excell Seltzer, MD    Allergies Patient has no known allergies.  Family History  Problem Relation Age of Onset  . Cancer Mother   . Colon cancer Mother 81    Social History Social History   Tobacco Use  . Smoking status: Never Smoker  . Smokeless tobacco: Never Used  Substance Use Topics  . Alcohol use: No  . Drug use: Yes    Types: Marijuana    Comment: Hx marijuana use - none x 2 yrs     Review of Systems  Constitutional: No fever/chills Gastrointestinal:  No nausea, no vomiting.  Musculoskeletal: Positive for thigh pain.  Skin: Negative for rash, ecchymosis. Positive for healed laceration.  Neurological: Negative for numbness or tingling   ____________________________________________   PHYSICAL EXAM:  VITAL SIGNS: ED Triage Vitals  Enc Vitals Group     BP 05/06/18 0735 (!)  154/70     Pulse Rate 05/06/18 0735 71     Resp 05/06/18 0735 18     Temp 05/06/18 0735 97.7 F (36.5 C)     Temp Source 05/06/18 0735 Oral     SpO2 05/06/18 0735 99 %     Weight 05/06/18 0734 200 lb (90.7 kg)     Height 05/06/18 0734 5\' 11"  (1.803 m)     Head Circumference --      Peak Flow --      Pain Score 05/06/18 0734 8     Pain Loc --      Pain Edu? --      Excl. in GC? --      Constitutional: Alert and oriented. Well appearing and in no acute distress. Eyes: Conjunctivae are normal. PERRL. EOMI. Head: Atraumatic. ENT:      Ears:      Nose: No congestion/rhinnorhea.      Mouth/Throat: Mucous membranes are moist.  Neck: No stridor.  Cardiovascular: Normal rate, regular rhythm.  Good peripheral circulation. Respiratory: Normal respiratory effort without tachypnea or retractions. Lungs CTAB. Good air entry to the bases with no decreased or absent breath sounds. Musculoskeletal: Full range of motion to all extremities. No gross deformities appreciated.  Well-healed puncture wounds to right medial thigh with underlying induration.  No erythema.  Full range of motion of hip and knee.  No calf tenderness. Neurologic:  Normal speech and language. No gross focal neurologic deficits are appreciated.  Skin:  Skin is warm, dry and intact. No rash noted. Psychiatric: Mood and affect are normal. Speech and behavior are normal. Patient exhibits appropriate insight and judgement.   ____________________________________________   LABS (all labs ordered are listed, but only abnormal results are displayed)  Labs Reviewed - No data to display ____________________________________________  EKG   ____________________________________________  RADIOLOGY Lexine Baton, personally viewed and evaluated these images (plain radiographs) as part of my medical decision making, as well as reviewing the written report by the radiologist.  Dg Femur, Min 2 Views Right  Result Date:  05/06/2018 CLINICAL DATA:  Right knee pain for 1 week. Dog bite to right thigh 2 months ago. EXAM: RIGHT FEMUR 2 VIEWS COMPARISON:  None. FINDINGS: No fracture or destructive osseous lesion is seen in the femur. The right hip and knee are located. Right hip joint space width is preserved. There is at most mild medial and lateral compartment joint space narrowing and spurring at the knee. No sizable knee joint effusion is evident. Nonspecific calcifications are noted in the soft tissues about the distal aspect of the femoral shaft. IMPRESSION: 1. No acute osseous abnormality identified. 2. Minimal right knee osteoarthrosis. Electronically Signed  By: Sebastian AcheAllen  Grady M.D.   On: 05/06/2018 08:37    ____________________________________________    PROCEDURES  Procedure(s) performed:    Procedures    Medications  lidocaine (LIDODERM) 5 % 1 patch (1 patch Transdermal Patch Applied 05/06/18 0948)  ketorolac (TORADOL) 30 MG/ML injection 30 mg (30 mg Intramuscular Given 05/06/18 0948)     ____________________________________________   INITIAL IMPRESSION / ASSESSMENT AND PLAN / ED COURSE  Pertinent labs & imaging results that were available during my care of the patient were reviewed by me and considered in my medical decision making (see chart for details).  Review of the [place] CSRS was performed in accordance of the [facility] prior to dispensing any controlled drugs.     Patient presented to the emergency department for evaluation of pain control after dog bite 2 months ago. No acute osseous abnormality on xray.  I suspect that patient has nerve injury after bite wound.  Patient has an appointment with Ortho at the end of the month and is going to follow-up with this appointment.  He is also going to see primary care this week.  He would like some medication for pain and a note for work.  We discussed imaging in the ED and patient would like to follow-up with Ortho and primary care.  IM Toradol was  given and lidocaine patch was applied.   Patient will be discharged home with prescriptions for toradol and lidocaine. Patient is to follow up with PCP and ortho as directed. Patient is given ED precautions to return to the ED for any worsening or new symptoms.     ____________________________________________  FINAL CLINICAL IMPRESSION(S) / ED DIAGNOSES  Final diagnoses:  Animal bite  Nerve pain  Acute pain of left knee      NEW MEDICATIONS STARTED DURING THIS VISIT:  ED Discharge Orders        Ordered    ketorolac (TORADOL) 10 MG tablet  Every 6 hours PRN     05/06/18 1009    lidocaine (LIDODERM) 5 %  Every 24 hours     05/06/18 1009          This chart was dictated using voice recognition software/Dragon. Despite best efforts to proofread, errors can occur which can change the meaning. Any change was purely unintentional.    Enid DerryWagner, Othell Diluzio, PA-C 05/06/18 1146    Schaevitz, Myra Rudeavid Matthew, MD 05/06/18 [phone]

## 2018-05-06 NOTE — ED Triage Notes (Addendum)
Pt arrived via POV with reports of right knee pain for the past week, states he was bit by a dog in May and states he think the pain was related from the bite.  Pt has knee brace in place and is able to bear weight but is painful and walks with a limp.   [facility] who placed the brace on.

## 2018-05-06 NOTE — ED Notes (Signed)

## 2018-05-23 ENCOUNTER — Other Ambulatory Visit: Payer: Self-pay | Admitting: *Deleted

## 2018-05-24 ENCOUNTER — Ambulatory Visit: Payer: Self-pay | Admitting: Family Medicine

## 2018-05-24 ENCOUNTER — Encounter: Payer: Self-pay | Admitting: Family Medicine

## 2018-05-24 ENCOUNTER — Ambulatory Visit (INDEPENDENT_AMBULATORY_CARE_PROVIDER_SITE_OTHER): Payer: Medicare HMO | Admitting: Family Medicine

## 2018-05-24 VITALS — BP 140/80 | HR 69 | Temp 98.3°F | Ht 71.5 in | Wt 204.0 lb

## 2018-05-24 DIAGNOSIS — W540XXS Bitten by dog, sequela: Secondary | ICD-10-CM | POA: Diagnosis not present

## 2018-05-24 DIAGNOSIS — M79651 Pain in right thigh: Secondary | ICD-10-CM

## 2018-05-24 DIAGNOSIS — S71151S Open bite, right thigh, sequela: Secondary | ICD-10-CM | POA: Diagnosis not present

## 2018-05-24 NOTE — Assessment & Plan Note (Signed)
Likely nerve damage from animal bite Tolerable pain control with lyrica.  Unable to work at job where standing long time.

## 2018-05-24 NOTE — Progress Notes (Signed)
   Subjective:    Patient ID: Adrian Cox, male    DOB: 02-19-1961, 57 y.o.   MRN: [id number]  HPI  57 year old male presents for ED follow up.   Seen ED for pain control from animal bite ( occurred 5/29) on 05/06/2018. Has pending MRI to eval pain in right knee [facility]... But appt was cancelled and he is not sure why.  He reports that after working 3 days at [employer] his pain increased dramatically.. This is what sent him to ER.  IM Toradol given and lidocaine patch applied... Given rx for both.  Nml femur film.   Today he reports pain is improved when taking lyrica 2 times daily.. Pain is improved,. tolerable  Pain increases when up on feet. No SE to lyrica.  Knee brace helps. He cannot go back. Back to work.[employer]. He plans to take early retirement. 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   Social History /Family History/Past Medical History reviewed in detail and updated in EMR if needed. Blood pressure 140/80, pulse 69, temperature 98.3 F (36.8 C), temperature source Oral, height 5' 11.5" (1.816 m), weight 204 lb (92.5 kg).   Review of Systems  Constitutional: Negative for fatigue and fever.  HENT: Negative for ear pain.   Eyes: Negative for pain.  Respiratory: Negative for cough and shortness of breath.   Cardiovascular: Negative for chest pain, palpitations and leg swelling.  Gastrointestinal: Negative for abdominal pain.  Genitourinary: Negative for dysuria.  Musculoskeletal: Positive for arthralgias, gait problem and myalgias.  Neurological: Negative for syncope, light-headedness and headaches.  Psychiatric/Behavioral: Negative for dysphoric mood.       Objective:   Physical Exam  Constitutional: Vital signs are normal. He appears well-developed and well-nourished.  HENT:  Head: Normocephalic.  Right Ear: Hearing normal.  Left Ear: Hearing normal.  Nose: Nose normal.  Mouth/Throat: Oropharynx is clear and moist and mucous membranes are  normal.  Neck: Trachea normal. Carotid bruit is not present. No thyroid mass and no thyromegaly present.  Cardiovascular: Normal rate, regular rhythm and normal pulses. Exam reveals no gallop, no distant heart sounds and no friction rub.  No murmur heard. No peripheral edema  Pulmonary/Chest: Effort normal and breath sounds normal. No respiratory distress.  Musculoskeletal:       Right knee: He exhibits normal range of motion and no swelling. Tenderness found. Medial joint line tenderness noted. No lateral joint line and no patellar tendon tenderness noted.   Very mild ttp and slight crepitus  Skin: Skin is warm, dry and intact. No rash noted.  Healed puncture wounds on right inner thigh ,left ankle and left arm  Right inner thigh without redness, no discharge,  Significant soreness to palpation of inner thigh ... No current swelling of tissue Pulses in bilateral ankles nml and sensation in LE intact.  Psychiatric: He has a normal mood and affect. His speech is normal and behavior is normal. Thought content normal.          Assessment & Plan:

## 2018-05-24 NOTE — Patient Instructions (Addendum)
Continue Lyrica twice daily for pain.  Make sure to continue home physical therapy and keep up with walking.

## 2018-06-15 ENCOUNTER — Ambulatory Visit: Payer: Medicare HMO | Admitting: Family Medicine

## 2018-06-15 DIAGNOSIS — Z0289 Encounter for other administrative examinations: Secondary | ICD-10-CM

## 2018-07-06 ENCOUNTER — Ambulatory Visit: Payer: Medicare HMO | Admitting: Psychology

## 2018-07-10 ENCOUNTER — Ambulatory Visit (INDEPENDENT_AMBULATORY_CARE_PROVIDER_SITE_OTHER): Payer: Medicare HMO | Admitting: Family Medicine

## 2018-07-10 ENCOUNTER — Encounter: Payer: Self-pay | Admitting: Family Medicine

## 2018-07-10 VITALS — BP 140/90 | HR 71 | Temp 98.3°F | Ht 71.5 in | Wt 209.0 lb

## 2018-07-10 DIAGNOSIS — S71151S Open bite, right thigh, sequela: Secondary | ICD-10-CM

## 2018-07-10 DIAGNOSIS — M5440 Lumbago with sciatica, unspecified side: Secondary | ICD-10-CM | POA: Diagnosis not present

## 2018-07-10 DIAGNOSIS — M25561 Pain in right knee: Secondary | ICD-10-CM | POA: Diagnosis not present

## 2018-07-10 DIAGNOSIS — W540XXS Bitten by dog, sequela: Secondary | ICD-10-CM | POA: Diagnosis not present

## 2018-07-10 DIAGNOSIS — G8929 Other chronic pain: Secondary | ICD-10-CM

## 2018-07-10 DIAGNOSIS — M79651 Pain in right thigh: Secondary | ICD-10-CM

## 2018-07-10 NOTE — Assessment & Plan Note (Signed)
Secondary to dog bite causing nerve damage.  Minimal improvement, but tolerable on lyrica BID 150.

## 2018-07-10 NOTE — Patient Instructions (Signed)
Please stop at the front desk to set up referral.  

## 2018-07-10 NOTE — Progress Notes (Signed)
Subjective:    Patient ID: Adrian Cox, male    DOB: [birth year], 57 y.o.   MRN: [id number]  Shoulder Pain   Pertinent negatives include no fever.    57 year old male presents for   follow up dog bite 03/14/2018, continued issues.  1. Follow up right upper thigh and right knee pain following dog bite  Chronic nerve pain in thigh resulting treated with lyrica 150 mg BID.  He does fairly well during the week.. Walking  Dog,  Up on feet, cannot walk more or stand on feet more that 30min at a time.. But by end of the week he has severe pain, cannot function well  Pain has not really improved overall since initiall bite.   He has been wearing a brace on  right knee. Does not really help ,much.  Right leg feel weak. Giving out on him. Right knee pain anterior and lateral 8-9/10 ... Knee constantly givining weigh and popping, clicking. Saw ortho.. Wanted to do MRI but there was a cost issue?  Also having pain in right buttock, that radiates to right leg 3/10.[employer]. Appears separate from knee and thigh pain.... Hx of sciatica as well that has worsened off and on since dog bite.   When he pushes on right inner thigh he has tingling and pain in right inner thigh down to right knee. Severe.   He feels he needs a cane to support him walking. Has not fallen recently. He is trying to strengthen his legs by doing bicycle.[employer].5-10 min at a time Left lower leg two healed bite marks still sore to touch.   Has tried diclofenac, ibuprofen, Ice without relief.  No joint redness, no swelling.   Reviewed [facility] note.   Review of Systems  Constitutional: Negative for fatigue and fever.  HENT: Negative for ear pain.   Eyes: Negative for pain.  Respiratory: Negative for cough and shortness of breath.   Cardiovascular: Negative for chest pain, palpitations and leg swelling.  Gastrointestinal: Negative for abdominal pain.  Genitourinary: Negative for dysuria.  Neurological: Negative for syncope,  light-headedness and headaches.  Psychiatric/Behavioral: Negative for dysphoric mood.       Objective:   Physical Exam  Constitutional: Vital signs are normal. He appears well-developed and well-nourished.  HENT:  Head: Normocephalic.  Right Ear: Hearing normal.  Left Ear: Hearing normal.  Nose: Nose normal.  Mouth/Throat: Oropharynx is clear and moist and mucous membranes are normal.  Neck: Trachea normal. Carotid bruit is not present. No thyroid mass and no thyromegaly present.  Cardiovascular: Normal rate, regular rhythm and normal pulses. Exam reveals no gallop, no distant heart sounds and no friction rub.  No murmur heard. No peripheral edema  Pulmonary/Chest: Effort normal and breath sounds normal. No respiratory distress.  Musculoskeletal:       Right knee: He exhibits decreased range of motion. He exhibits no swelling, no effusion, no ecchymosis, no deformity and no erythema. Tenderness found. Medial joint line and lateral joint line tenderness noted.       Lumbar back: He exhibits tenderness. He exhibits normal range of motion and no bony tenderness.  ttp right sciatic notch, neg right SLR  ttp in right medial and anterior thigh   ttp over healed bite marks on left lower leg  Skin: Skin is warm, dry and intact. No rash noted.  Psychiatric: He has a normal mood and affect. His speech is normal and behavior is normal. Thought content normal.  Assessment & Plan:

## 2018-07-10 NOTE — Assessment & Plan Note (Signed)
Stable control on lyrica.

## 2018-07-10 NOTE — Assessment & Plan Note (Signed)
Following dog bite. No improvement with ice brace > 2 months NSAIDS, unremarkable X-ray.  Eval with MRI right knee.

## 2018-07-17 ENCOUNTER — Ambulatory Visit
Admission: RE | Admit: 2018-07-17 | Discharge: 2018-07-17 | Disposition: A | Discharge status: Home / Self Care | Payer: Medicare HMO | Source: Ambulatory Visit | Attending: Family Medicine | Admitting: Family Medicine

## 2018-07-17 DIAGNOSIS — M25561 Pain in right knee: Secondary | ICD-10-CM

## 2018-07-30 ENCOUNTER — Other Ambulatory Visit: Payer: Self-pay

## 2018-07-30 ENCOUNTER — Emergency Department
Admission: EM | Admit: 2018-07-30 | Discharge: 2018-07-30 | Disposition: A | Discharge status: Home / Self Care | Payer: Medicare HMO | Attending: Emergency Medicine | Admitting: Emergency Medicine

## 2018-07-30 ENCOUNTER — Encounter: Payer: Self-pay | Admitting: Emergency Medicine

## 2018-07-30 DIAGNOSIS — I10 Essential (primary) hypertension: Secondary | ICD-10-CM | POA: Insufficient documentation

## 2018-07-30 DIAGNOSIS — Z79899 Other long term (current) drug therapy: Secondary | ICD-10-CM | POA: Diagnosis not present

## 2018-07-30 DIAGNOSIS — M545 Low back pain: Secondary | ICD-10-CM | POA: Diagnosis present

## 2018-07-30 DIAGNOSIS — M5442 Lumbago with sciatica, left side: Secondary | ICD-10-CM | POA: Insufficient documentation

## 2018-07-30 LAB — URINALYSIS, COMPLETE (UACMP) WITH MICROSCOPIC
BACTERIA UA: NONE SEEN
Bilirubin Urine: NEGATIVE
Glucose, UA: NEGATIVE mg/dL
Hgb urine dipstick: NEGATIVE
Ketones, ur: NEGATIVE mg/dL
Leukocytes, UA: NEGATIVE
Nitrite: NEGATIVE
PROTEIN: NEGATIVE mg/dL
SQUAMOUS EPITHELIAL / LPF: NONE SEEN (ref 0–5)
Specific Gravity, Urine: 1.006 (ref 1.005–1.030)
pH: 8 (ref 5.0–8.0)

## 2018-07-30 MED ORDER — KETOROLAC TROMETHAMINE 30 MG/ML IJ SOLN
30.0000 mg | Freq: Once | INTRAMUSCULAR | Status: AC
  Administered 2018-07-30: 30 mg via INTRAMUSCULAR
  Filled 2018-07-30: qty 1

## 2018-07-30 NOTE — Discharge Instructions (Addendum)
Follow-up with your primary care provider if any continued problems.  You may use ice or heat to your back as needed for discomfort.  Today you were given an injection for your back that you have gotten in the past which has given you great relief of your sciatica.

## 2018-07-30 NOTE — ED Notes (Signed)
Pt has hx of Sciatica and states he is having lower back pain radiating down left leg. States his left leg "gave way" twice this morning. Has felt tingling in left leg. No loss of bowel or bladder. Has taken Lyrica at home with little relief. Denies recent injury.

## 2018-07-30 NOTE — ED Provider Notes (Signed)
[facility] Emergency Department Provider Note  ____________________________________________   First MD Initiated Contact with Patient 07/30/18 1308     (approximate)  I have reviewed the triage vital signs and the nursing notes.   HISTORY  Chief Complaint Back Pain   HPI Adrian Cox is a 57 y.o. male presents to the ED with complaint of left sided sciatica that began a few days ago.  Patient denies any recent injury.  He states he has had a history of sciatica.  He states that he has a prescription for Lyrica that he takes but this is given him little relief at this time.  Patient in the past has gotten an injection of Toradol which usually makes his sciatica go away.  He denies any incontinence of bowel or bladder, no saddle anesthesias.  He rates his pain as a 10/10.  Past Medical History:  Diagnosis Date  . Depression    bipolar  . Diverticulosis   . Hyperlipidemia   . Hypertension   . Mold exposure     Patient Active Problem List   Diagnosis Date Noted  . Pain in right thigh 03/27/2018  . Lateral knee pain, right 03/27/2018  . Dog bite of thigh without complication, right, sequela 03/20/2018  . Marijuana abuse, continuous 11/14/2017  . Chronic low back pain with sciatica 11/09/2017  . Osteoarthritis of spine 02/23/2017  . Chronic right shoulder pain 01/09/2017  . Essential hypertension 08/17/2016  . Bipolar disorder (HCC) 08/17/2016  . Preventative health care 08/17/2016  . Hyperlipidemia 08/17/2016    Past Surgical History:  Procedure Laterality Date  . BACK SURGERY    . CERVICAL SPINE SURGERY  2003   fusion  . KNEE ARTHROSCOPY Right   . KNEE ARTHROSCOPY Left   . SPINE SURGERY      Prior to Admission medications   Medication Sig Start Date End Date Taking? Authorizing Provider  cholecalciferol (VITAMIN D) 1000 units tablet Take 1,000 Units by mouth daily.    [provider]  FIBER PO Take 4 tablets by mouth daily.     [provider]  Multiple Vitamin (MULTIVITAMIN WITH MINERALS) TABS tablet Take 1 tablet by mouth daily.    [provider]  omega-3 acid ethyl esters (LOVAZA) 1 g capsule Take 2 g by mouth daily.    [provider]  pregabalin (LYRICA) 150 MG capsule Take 1 capsule (150 mg total) by mouth 2 (two) times daily. 04/18/18   Excell Seltzer, MD    Allergies Patient has no known allergies.  Family History  Problem Relation Age of Onset  . Cancer Mother   . Colon cancer Mother 73    Social History Social History   Tobacco Use  . Smoking status: Never Smoker  . Smokeless tobacco: Never Used  Substance Use Topics  . Alcohol use: No  . Drug use: Yes    Types: Marijuana    Comment: Hx marijuana use - none x 2 yrs    Review of Systems Constitutional: No fever/chills Cardiovascular: Denies chest pain. Respiratory: Denies shortness of breath. Gastrointestinal: No abdominal pain.  No nausea, no vomiting. Genitourinary: Negative for dysuria. Musculoskeletal: Positive for low back pain with left leg sciatica. Skin: Negative for rash. Neurological: Negative for headaches, focal weakness or numbness. ____________________________________________   PHYSICAL EXAM:  VITAL SIGNS: ED Triage Vitals  Enc Vitals Group     BP 07/30/18 1136 (!) 159/72     Pulse Rate 07/30/18 1136 75  Resp 07/30/18 1136 18     Temp 07/30/18 1136 97.9 F (36.6 C)     Temp Source 07/30/18 1136 Oral     SpO2 07/30/18 1136 96 %     Weight 07/30/18 1137 209 lb (94.8 kg)     Height 07/30/18 1137 5\' 11"  (1.803 m)     Head Circumference --      Peak Flow --      Pain Score 07/30/18 1144 10     Pain Loc --      Pain Edu? --      Excl. in GC? --    Constitutional: Alert and oriented. Well appearing and in no acute distress. Eyes: Conjunctivae are normal.  Head: Atraumatic. Nose: Mild congestion/rhinnorhea. Neck: No stridor.   Cardiovascular: Normal rate, regular rhythm. Grossly  normal heart sounds.  Good peripheral circulation. Respiratory: Normal respiratory effort.  No retractions. Lungs CTAB. Gastrointestinal: Soft and nontender. No distention.  No CVA tenderness. Musculoskeletal: Semination of the back there is no gross deformity however there is tenderness on palpation of the left SI joint area and surrounding tissue.  There is no point tenderness on palpation of the thoracic or lumbar spine.  Range of motion is slightly restricted secondary to discomfort.  Straight leg raises increased pain but are considered negative.  Good muscle strength bilaterally. Neurologic:  Normal speech and language. No gross focal neurologic deficits are appreciated.  Reflexes are 2+ bilaterally.  No gait instability. Skin:  Skin is warm, dry and intact. No rash noted. Psychiatric: Mood and affect are normal. Speech and behavior are normal.  ____________________________________________   LABS (all labs ordered are listed, but only abnormal results are displayed)  Labs Reviewed  URINALYSIS, COMPLETE (UACMP) WITH MICROSCOPIC - Abnormal; Notable for the following components:      Result Value   Color, Urine COLORLESS (*)    APPearance CLEAR (*)    All other components within normal limits    PROCEDURES  Procedure(s) performed: None  Procedures  Critical Care performed: No  ____________________________________________   INITIAL IMPRESSION / ASSESSMENT AND PLAN / ED COURSE  As part of my medical decision making, I reviewed the following data within the electronic MEDICAL RECORD NUMBER Notes from prior ED visits and [place] Controlled Substance Database  Presents to the emergency department with complaint of left-sided sciatica that is bothering him today.  Patient states that he takes Lyrica at home but has given him little relief.  He denies any recent injury or loss of bowel or bladder continence.  Patient states that he comes once here for a Toradol shot which takes care of his  sciatica.  He denies any other symptoms.  Patient is ambulatory.  Exam is reassuring and consistent with low back pain with left-sided sciatica.  Patient was given Toradol 30 mg IM and to follow-up with his PCP if any continued problems.  ____________________________________________   FINAL CLINICAL IMPRESSION(S) / ED DIAGNOSES  Final diagnoses:  Acute left-sided low back pain with left-sided sciatica     ED Discharge Orders    None       Note:  This document was prepared using Dragon voice recognition software and may include unintentional dictation errors.    Tommi Rumps, PA-C 07/30/18 1405    Minna Antis, MD 07/30/18 [id number]

## 2018-07-30 NOTE — ED Triage Notes (Signed)
Pt reports that he is having lower back pain that shoots down his left leg. Pt is able to ambulate without difficulty.

## 2018-08-15 ENCOUNTER — Emergency Department
Admission: EM | Admit: 2018-08-15 | Discharge: 2018-08-15 | Disposition: A | Discharge status: Home / Self Care | Payer: Medicare HMO | Attending: Emergency Medicine | Admitting: Emergency Medicine

## 2018-08-15 ENCOUNTER — Encounter: Payer: Self-pay | Admitting: Emergency Medicine

## 2018-08-15 ENCOUNTER — Other Ambulatory Visit: Payer: Self-pay

## 2018-08-15 DIAGNOSIS — M545 Low back pain: Secondary | ICD-10-CM | POA: Insufficient documentation

## 2018-08-15 DIAGNOSIS — Z5321 Procedure and treatment not carried out due to patient leaving prior to being seen by health care provider: Secondary | ICD-10-CM | POA: Diagnosis not present

## 2018-08-15 NOTE — ED Notes (Signed)
Pt walking out states he will get his regular doctor to see him. States this is ridiculous. Thsi RN apologized to pt for the long wait and asked him to stay but pt refuses.

## 2018-08-15 NOTE — ED Triage Notes (Signed)
Here for lower back pain and down left leg.  Hx of sciatic pain and reports pain is breakthrough pain.  Takes lyrica at home but not helping. Pt thinks he had a toradol shot here 2 weeks ago for same pain and it helped.  No loss bowel or bladder.  Was ambulatory to first nurse desk then placed in wheelchair.

## 2018-08-16 ENCOUNTER — Telehealth: Payer: Self-pay

## 2018-08-16 NOTE — Telephone Encounter (Signed)
[facility] faxed note pt went to ED last night with sciatic nerve pain and left after not being seen after 2 hrs. Pt request appt. Unable to reach pt at 3 contact #s.

## 2018-08-16 NOTE — Telephone Encounter (Signed)
Pt called back and was given an appt on 08/17/18 at 9:15 with Dr Alphonsus Sias. Pt understand if pain worsens to go back to ED. FYI to Dr Alphonsus Sias. Copy of [facility] note in Dr Karle Starch in basket.

## 2018-08-16 NOTE — Telephone Encounter (Signed)
Will assess then Doesn't sound like emergency

## 2018-08-16 NOTE — Telephone Encounter (Addendum)
Left message for pt to call back.   Per Rena, we have no appts available.  If pt still needs to be seen, he will need to go to [facility] or ER.

## 2018-08-17 ENCOUNTER — Ambulatory Visit (INDEPENDENT_AMBULATORY_CARE_PROVIDER_SITE_OTHER): Payer: Medicare HMO | Admitting: Internal Medicine

## 2018-08-17 ENCOUNTER — Other Ambulatory Visit: Payer: Self-pay | Admitting: Family Medicine

## 2018-08-17 ENCOUNTER — Encounter: Payer: Self-pay | Admitting: Internal Medicine

## 2018-08-17 VITALS — BP 132/84 | HR 73 | Temp 97.8°F | Ht 71.5 in | Wt 211.0 lb

## 2018-08-17 DIAGNOSIS — M5416 Radiculopathy, lumbar region: Secondary | ICD-10-CM | POA: Diagnosis not present

## 2018-08-17 MED ORDER — PREDNISONE 20 MG PO TABS: 40.0000 mg | ORAL_TABLET | Freq: Every day | ORAL | 0 refills | Status: DC

## 2018-08-17 MED ORDER — OXYCODONE HCL 5 MG PO CAPS: 5.0000 mg | ORAL_CAPSULE | ORAL | 0 refills | Status: DC | PRN

## 2018-08-17 NOTE — Assessment & Plan Note (Signed)
Severe exacerbation now Will give some oxycodone (CSRS checked and no problems) Steroid burst Will forward to Dr B

## 2018-08-17 NOTE — Progress Notes (Signed)
Subjective:    Patient ID: Adrian Cox, male    DOB: [birth year], 57 y.o.   MRN: [id number]  HPI Here due to back pain  Has titanium rods in cervical spine--years ago Now feels he is having "breakthrough pain" Went to ER but wasn't seen---got appt here  lyrica bid Added tylenol 1000 three days a day  Pain is in bilateral low back--not along the lumbar spine Worse if sitting for a long time Cold makes it worse Pain goes into legs--more on left This is similar to past cervical myelopathy pain  voltaren and meloxicam hasn't really worked well  Current Outpatient Medications on File Prior to Visit  Medication Sig Dispense Refill  . cholecalciferol (VITAMIN D) 1000 units tablet Take 1,000 Units by mouth daily.    Marland Kitchen FIBER PO Take 4 tablets by mouth daily.    . Multiple Vitamin (MULTIVITAMIN WITH MINERALS) TABS tablet Take 1 tablet by mouth daily.    Marland Kitchen omega-3 acid ethyl esters (LOVAZA) 1 g capsule Take 2 g by mouth daily.    . pregabalin (LYRICA) 150 MG capsule Take 1 capsule (150 mg total) by mouth 2 (two) times daily. 60 capsule 2   No current facility-administered medications on file prior to visit.     No Known Allergies  Past Medical History:  Diagnosis Date  . Depression    bipolar  . Diverticulosis   . Hyperlipidemia   . Hypertension   . Mold exposure     Past Surgical History:  Procedure Laterality Date  . BACK SURGERY    . CERVICAL SPINE SURGERY  2003   fusion  . KNEE ARTHROSCOPY Right   . KNEE ARTHROSCOPY Left   . SPINE SURGERY      Family History  Problem Relation Age of Onset  . Cancer Mother   . Colon cancer Mother 18    Social History   Socioeconomic History  . Marital status: Married    Spouse name: Not on file  . Number of children: Not on file  . Years of education: Not on file  . Highest education level: Not on file  Occupational History  . Not on file  Social Needs  . Financial resource strain: Not on file  . Food insecurity:     Worry: Not on file    Inability: Not on file  . Transportation needs:    Medical: Not on file    Non-medical: Not on file  Tobacco Use  . Smoking status: Never Smoker  . Smokeless tobacco: Never Used  Substance and Sexual Activity  . Alcohol use: No  . Drug use: Yes    Types: Marijuana    Comment: Hx marijuana use - none x 2 yrs  . Sexual activity: Yes  Lifestyle  . Physical activity:    Days per week: Not on file    Minutes per session: Not on file  . Stress: Not on file  Relationships  . Social connections:    Talks on phone: Not on file    Gets together: Not on file    Attends religious service: Not on file    Active member of club or organization: Not on file    Attends meetings of clubs or organizations: Not on file    Relationship status: Not on file  . Intimate partner violence:    Fear of current or ex partner: Not on file    Emotionally abused: Not on file    Physically abused: Not on  file    Forced sexual activity: Not on file  Other Topics Concern  . Not on file  Social History Narrative   Patient is married, has no children. He does not smoke drink or use any substances. He has a high school education. He is currently not employed. He does some walking, but really does not get a lot of regular exercise. He has membership at [employer] but he doesn't use it   Review of Systems Right shoulder and arm pain also Sleeps okay on Memory Foam mattress     Objective:   Physical Exam  Musculoskeletal:   No localized spine or paraspinal tenderness  Neurological:  Antalgic gait Clear leg weakness---especially knee extensors           Assessment & Plan:

## 2018-08-18 ENCOUNTER — Other Ambulatory Visit: Payer: Self-pay | Admitting: Family Medicine

## 2018-08-20 NOTE — Telephone Encounter (Signed)
Last office visit 08/17/2018 for Lumbar Radiculopathy with Dr. Alphonsus Sias.  Last refilled 04/18/2018 for #60 with 2 refills.  CPE schedule 11/20/2018.  Ok to refill?

## 2018-08-20 NOTE — Telephone Encounter (Signed)
Last office visit 08/17/2018 for Lumbar Radiculopathy with Dr. Letvak.  Last refilled 04/18/2018 for #60 with 2 refills.  CPE schedule 11/20/2018.  Ok to refill? 

## 2018-09-04 ENCOUNTER — Ambulatory Visit: Payer: Medicare HMO | Admitting: Family Medicine

## 2018-09-04 ENCOUNTER — Encounter: Payer: Self-pay | Admitting: *Deleted

## 2018-09-05 ENCOUNTER — Ambulatory Visit (INDEPENDENT_AMBULATORY_CARE_PROVIDER_SITE_OTHER): Payer: Medicare HMO | Admitting: Family Medicine

## 2018-09-05 ENCOUNTER — Other Ambulatory Visit: Payer: Self-pay

## 2018-09-05 ENCOUNTER — Emergency Department (HOSPITAL_COMMUNITY)
Admission: EM | Admit: 2018-09-05 | Discharge: 2018-09-05 | Disposition: A | Discharge status: Home / Self Care | Payer: Medicare HMO | Attending: Emergency Medicine | Admitting: Emergency Medicine

## 2018-09-05 ENCOUNTER — Encounter: Payer: Self-pay | Admitting: Family Medicine

## 2018-09-05 VITALS — BP 144/62 | HR 70 | Temp 98.2°F | Ht 71.5 in | Wt 205.1 lb

## 2018-09-05 DIAGNOSIS — M5412 Radiculopathy, cervical region: Secondary | ICD-10-CM | POA: Diagnosis not present

## 2018-09-05 DIAGNOSIS — M5440 Lumbago with sciatica, unspecified side: Secondary | ICD-10-CM

## 2018-09-05 DIAGNOSIS — G8929 Other chronic pain: Secondary | ICD-10-CM

## 2018-09-05 DIAGNOSIS — I10 Essential (primary) hypertension: Secondary | ICD-10-CM | POA: Insufficient documentation

## 2018-09-05 DIAGNOSIS — M545 Low back pain: Secondary | ICD-10-CM | POA: Diagnosis present

## 2018-09-05 DIAGNOSIS — Z79899 Other long term (current) drug therapy: Secondary | ICD-10-CM | POA: Diagnosis not present

## 2018-09-05 DIAGNOSIS — M5441 Lumbago with sciatica, right side: Secondary | ICD-10-CM | POA: Insufficient documentation

## 2018-09-05 DIAGNOSIS — M5442 Lumbago with sciatica, left side: Secondary | ICD-10-CM | POA: Diagnosis not present

## 2018-09-05 DIAGNOSIS — M5416 Radiculopathy, lumbar region: Secondary | ICD-10-CM | POA: Diagnosis not present

## 2018-09-05 DIAGNOSIS — M5432 Sciatica, left side: Secondary | ICD-10-CM

## 2018-09-05 MED ORDER — LIDOCAINE 5 % EX PTCH
1.0000 | MEDICATED_PATCH | CUTANEOUS | Status: DC
  Administered 2018-09-05: 1 via TRANSDERMAL
  Filled 2018-09-05: qty 1

## 2018-09-05 MED ORDER — PREDNISONE 20 MG PO TABS: ORAL_TABLET | ORAL | 0 refills | Status: DC

## 2018-09-05 MED ORDER — KETOROLAC TROMETHAMINE 60 MG/2ML IM SOLN
60.0000 mg | Freq: Once | INTRAMUSCULAR | Status: AC
  Administered 2018-09-05: 60 mg via INTRAMUSCULAR
  Filled 2018-09-05: qty 2

## 2018-09-05 MED ORDER — CYCLOBENZAPRINE HCL 10 MG PO TABS: 5.0000 mg | ORAL_TABLET | Freq: Every day | ORAL | 0 refills | Status: DC

## 2018-09-05 MED ORDER — OXYCODONE HCL 5 MG PO CAPS: 5.0000 mg | ORAL_CAPSULE | Freq: Two times a day (BID) | ORAL | 0 refills | Status: DC | PRN

## 2018-09-05 NOTE — Patient Instructions (Signed)
Start longer and stronger course of prednisone.  Can try muscle relaxant at night.  Call Dr. Alvester MorinNewton office to be seen for re-evaluation of low back pain as well as cervical radiculopathy.  Use oxycodone as needed for severe pain.  Continue lyrica twice daily.

## 2018-09-05 NOTE — ED Triage Notes (Addendum)
Pt reports being seen at PCP for cervical radiculopathy today. Pt given prescription for oxycodone. Pt reports he doesn't have money to fill the prescription. Pt reports he is here for pain control.

## 2018-09-05 NOTE — Assessment & Plan Note (Signed)
Hx of cervical fusion.  Worsen pain in last few weeks... Was temporarily better with prednsione.  No known injury., no focal vertebral ttp. No clear cause for repeat X-ray.. See 2018 X-ray.  Will treat with longer pred taper, muscle relaxant and refer back to Dr. Alvester MorinNewton for possible other treatment options in neck.   No red flags for narcotic sue.. Will  Refill oxycodone but will plan as a temporary prescription for acute flare.

## 2018-09-05 NOTE — ED Provider Notes (Signed)
[facility] Provider Note   CSN: [id number] Arrival date & time: 09/05/18  1654     History   Chief Complaint Chief Complaint  Patient presents with  . Back Pain    HPI [place] Patternthony G Pfefferkorn is a 57 y.o. male.  The history is provided by the patient.  Back Pain   This is a chronic problem. The current episode started more than 1 week ago. The problem occurs daily. The problem has not changed since onset.The pain is associated with no known injury. The pain is present in the lumbar spine. The quality of the pain is described as aching. The pain radiates to the left knee. The pain is at a severity of 5/10. The pain is moderate. The symptoms are aggravated by bending, twisting and certain positions. Pertinent negatives include no chest pain, no fever, no numbness, no weight loss, no headaches, no abdominal pain, no abdominal swelling, no bowel incontinence, no perianal numbness, no bladder incontinence, no dysuria, no pelvic pain, no paresis and no weakness. He has tried NSAIDs and analgesics for the symptoms. The treatment provided mild relief.    Past Medical History:  Diagnosis Date  . Depression    bipolar  . Diverticulosis   . Hyperlipidemia   . Hypertension   . Mold exposure     Patient Active Problem List   Diagnosis Date Noted  . Cervical radiculopathy 09/05/2018  . Lumbar radiculopathy 08/17/2018  . Pain in right thigh 03/27/2018  . Lateral knee pain, right 03/27/2018  . Dog bite of thigh without complication, right, sequela 03/20/2018  . Marijuana abuse, continuous 11/14/2017  . Chronic low back pain with sciatica 11/09/2017  . Osteoarthritis of spine 02/23/2017  . Chronic right shoulder pain 01/09/2017  . Essential hypertension 08/17/2016  . Bipolar disorder (HCC) 08/17/2016  . Preventative health care 08/17/2016  . Hyperlipidemia 08/17/2016    Past Surgical History:  Procedure Laterality Date  . BACK SURGERY    .  CERVICAL SPINE SURGERY  2003   fusion  . KNEE ARTHROSCOPY Right   . KNEE ARTHROSCOPY Left   . SPINE SURGERY          Home Medications    Prior to Admission medications   Medication Sig Start Date End Date Taking? Authorizing Provider  cholecalciferol (VITAMIN D) 1000 units tablet Take 1,000 Units by mouth daily.    [provider]  cyclobenzaprine (FLEXERIL) 10 MG tablet Take 0.5-1 tablets (5-10 mg total) by mouth at bedtime. 09/05/18   Bedsole, Amy E, MD  FIBER PO Take 4 tablets by mouth daily.    [provider]  Multiple Vitamin (MULTIVITAMIN WITH MINERALS) TABS tablet Take 1 tablet by mouth daily.    [provider]  omega-3 acid ethyl esters (LOVAZA) 1 g capsule Take 2 g by mouth daily.    [provider]  oxycodone (OXY-IR) 5 MG capsule Take 1 capsule (5 mg total) by mouth 2 (two) times daily as needed. 09/05/18   Bedsole, Amy E, MD  predniSONE (DELTASONE) 20 MG tablet 3 tabs by mouth daily x 5 days, then 2 tabs by mouth daily x 5 days then 1 tab by mouth daily x 5 days 09/05/18   Bedsole, Amy E, MD  pregabalin (LYRICA) 150 MG capsule TAKE 1 CAPSULE BY MOUTH TWICE A DAY 08/20/18   Bedsole, Amy E, MD    Family History Family History  Problem Relation Age of Onset  . Cancer Mother   .  Colon cancer Mother 1    Social History Social History   Tobacco Use  . Smoking status: Never Smoker  . Smokeless tobacco: Never Used  Substance Use Topics  . Alcohol use: No  . Drug use: Yes    Types: Marijuana    Comment: Hx marijuana use - none x 2 yrs     Allergies   Patient has no known allergies.   Review of Systems Review of Systems  Constitutional: Negative for chills, fever and weight loss.  HENT: Negative for ear pain and sore throat.   Eyes: Negative for pain and visual disturbance.  Respiratory: Negative for cough and shortness of breath.   Cardiovascular: Negative for chest pain and palpitations.  Gastrointestinal: Negative for  abdominal pain, bowel incontinence and vomiting.  Genitourinary: Negative for bladder incontinence, dysuria, hematuria and pelvic pain.  Musculoskeletal: Positive for back pain. Negative for arthralgias.  Skin: Negative for color change and rash.  Neurological: Negative for seizures, syncope, weakness, numbness and headaches.  All other systems reviewed and are negative.    Physical Exam Updated Vital Signs  ED Triage Vitals  Enc Vitals Group     BP 09/05/18 1805 (!) 133/100     Pulse Rate 09/05/18 1805 72     Resp 09/05/18 1805 18     Temp 09/05/18 1659 97.8 F (36.6 C)     Temp Source 09/05/18 1659 Oral     SpO2 09/05/18 1805 100 %     Weight --      Height --      Head Circumference --      Peak Flow --      Pain Score 09/05/18 1700 10     Pain Loc --      Pain Edu? --      Excl. in GC? --     Physical Exam  Constitutional: He is oriented to person, place, and time. He appears well-developed and well-nourished.  HENT:  Head: Normocephalic and atraumatic.  Mouth/Throat: No oropharyngeal exudate.  Eyes: Pupils are equal, round, and reactive to light. Conjunctivae and EOM are normal.  Neck: Normal range of motion. Neck supple.  Cardiovascular: Normal rate, regular rhythm, normal heart sounds and intact distal pulses.  No murmur heard. Pulmonary/Chest: Effort normal and breath sounds normal. No respiratory distress.  Abdominal: Soft. Bowel sounds are normal. He exhibits no distension. There is no tenderness.  Musculoskeletal: Normal range of motion. He exhibits tenderness (TTP to left piriformis area ). He exhibits no edema.  Neurological: He is alert and oriented to person, place, and time. No cranial nerve deficit or sensory deficit. He exhibits normal muscle tone. Coordination normal.  5+/5 strength, normal sensation  Skin: Skin is warm and dry.  Psychiatric: He has a normal mood and affect.  Nursing note and vitals reviewed.    ED Treatments / Results   Labs (all labs ordered are listed, but only abnormal results are displayed) Labs Reviewed - No data to display  EKG None  Radiology No results found.  Procedures Procedures (including critical care time)  Medications Ordered in ED Medications  lidocaine (LIDODERM) 5 % 1 patch (1 patch Transdermal Patch Applied 09/05/18 1755)  ketorolac (TORADOL) injection 60 mg (60 mg Intramuscular Given 09/05/18 1755)     Initial Impression / Assessment and Plan / ED Course  I have reviewed the triage vital signs and the nursing notes.  Pertinent labs & imaging results that were available during my care of the patient were  reviewed by me and considered in my medical decision making (see chart for details).     REMER COUSE is a 57 year old male who presents to the ED with chronic back pain.  Patient with normal vitals.  No fever.  Patient with slightly worse back pain today.  Was seen by his primary care doctor today.  Patient given prescription for muscle relaxant and prednisone and oxycodone and Lyrica.  Patient states that he was in too much pain to go get his medications refilled.  Patient denies any loss of bowel or bladder.  No fever.  No signs of cauda equina on exam.  Patient likely with chronic sciatic pain.  He does follow-up with chronic pain for epidural injections.  Patient was given Toradol shot, lidocaine patch while in the ED.  Recommend that he fill his prescriptions.  Follow-up with primary care doctor and pain doctor.  Given return precautions.  But at this time no concern for acute spine process.  Patient with no midline spinal tenderness, no neurological deficits.  This chart was dictated using voice recognition software.  Despite best efforts to proofread,  errors can occur which can change the documentation meaning.   Final Clinical Impressions(s) / ED Diagnoses   Final diagnoses:  Sciatica of left side    ED Discharge Orders    None       Virgina Norfolk,  DO 09/05/18 1819

## 2018-09-05 NOTE — Assessment & Plan Note (Signed)
On lyrica.. Fairly stable control. Recommended heat, home PT.

## 2018-09-05 NOTE — ED Notes (Signed)
Patient given discharge instructions and verbalized understanding.  Patient stable to discharge at this time.  Patient is alert and oriented to baseline.  No distressed noted at this time.  All belongings taken with the patient at discharge.   

## 2018-09-05 NOTE — Progress Notes (Signed)
Subjective:    Patient ID: Adrian Cox, male    DOB: 1961-05-25, 57 y.o.   MRN: [id number]  HPI  57 year old male with history of cervical fusion, chronic low back pain with radiculopathy to left leg and dog bite causing chronic pain in right thigh/knee.   He reports today that he has been falling more often lately... 2 falls in last week. He reports the falls were related to left leg weakness and lef t leg giving way.  Using cane.  Having pain in left low back, left leg tight MRI 11/2017: IMPRESSION: 1. Status post LEFT L5 hemilaminectomy. Grade 1 L4-5 anterolisthesis and grade 1 L5-S1 retrolisthesis. No acute osseous process. 2. 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 L5-S1 lateral recess stenosis and granulation tissue may affect the traversing S1 nerves. 3. Neural foraminal narrowing L3-4 through L5-S1: Moderate to severe on the RIGHT at L4-5.  He has also noted right arm pain in last  Several week.  Pain in right neck radiates to elbow. Pain greater with moving head to right.  Weakness and numbness  In thumb side of hand. 10/10 pain at times.  ESI  in spring 2019 Dr. Alvester MorinNewton  No clear cause of flare..  No known injury or fall where neck injured.   X-ray: Cervical spine 02/2017: IMPRESSION: 1. Diffuse degenerative disc narrowing and spurring, stable from 10/31/2014.   Prednisone 10 days given  08/17/2018.Marland Kitchen. Helped temporarily.  Dr. Franky Machoabbell Surgery in 2003.. Rod in neck, fusion   tylenol extra strength and  oxycodone helped his pain.. Needs a refill  on lyrica.  UDS neg in 10/2017 Sign pain contract 10/2017... Does have new pharmacy given moved. No red flags on NCCSR reviewed today.  Review of Systems  Constitutional: Negative for fatigue and fever.  HENT: Negative for ear pain.   Eyes: Negative for pain.  Respiratory: Negative for cough and shortness of breath.   Cardiovascular: Negative for chest pain, palpitations and leg swelling.  Gastrointestinal: Negative  for abdominal pain.  Genitourinary: Negative for dysuria.  Musculoskeletal: Positive for back pain. Negative for arthralgias.  Neurological: Negative for syncope, light-headedness and headaches.  Psychiatric/Behavioral: Negative for dysphoric mood.       Objective:   Physical Exam  Constitutional: Vital signs are normal. He appears well-developed and well-nourished.  HENT:  Head: Normocephalic.  Right Ear: Hearing normal.  Left Ear: Hearing normal.  Nose: Nose normal.  Mouth/Throat: Oropharynx is clear and moist and mucous membranes are normal.  Neck: Trachea normal. Carotid bruit is not present. No thyroid mass and no thyromegaly present.  Cardiovascular: Normal rate, regular rhythm and normal pulses. Exam reveals no gallop, no distant heart sounds and no friction rub.  No murmur heard. No peripheral edema  Pulmonary/Chest: Effort normal and breath sounds normal. No respiratory distress.  Musculoskeletal:       Right shoulder: He exhibits normal range of motion and no tenderness.       Cervical back: He exhibits decreased range of motion and tenderness. He exhibits no bony tenderness.       Lumbar back: He exhibits decreased range of motion and tenderness. He exhibits no bony tenderness.  Positive spurling on right, neg SLR bilaterally  neg right neers, drip arm, full ROM right shoulder  Skin: Skin is warm, dry and intact. No rash noted.  Psychiatric: He has a normal mood and affect. His speech is normal and behavior is normal. Thought content normal.  Assessment & Plan:

## 2018-09-06 ENCOUNTER — Telehealth (INDEPENDENT_AMBULATORY_CARE_PROVIDER_SITE_OTHER): Payer: Self-pay | Admitting: Physical Medicine and Rehabilitation

## 2018-09-06 ENCOUNTER — Other Ambulatory Visit: Payer: Self-pay

## 2018-09-06 NOTE — Patient Outreach (Signed)
[facility]) Care Management  09/06/2018  Adrian Cox 1961-06-26 [id number]    Telephone Screen Referral Date :09/06/2018 Referral Source:[facility] ED Census Referral Reason:ED Utilization Insurance:[employer]  Outreach attempt # 1 to the patient for screening.  HIPAA verified with patient.  Discussed and offered [facility] with patient. Patient verbally agreed to services.  The patient was able to complete the screening.    Social:The patient states that he and his wife are currently living with her father.  He has lost his job and he and his wife are having problems and getting a divorce.  He states that he has no support and is currently looking for somewhere else to live because he and his wife will be getting a divorce. He states that he is independent with his ADLS/IADLS.  He no longer has transportation to his appointments because the car is in his wife's name. Durable medical equipment in the home include: cane, eyeglasses and blood pressure cuff.  Conditions: Per chart review and conversation with the patient his Medical conditions consist of HTN  the patient states that he has that under control and no longer takes medication.  Low back pain with sciatica, Bipolar Hyperlipidemia and Depression.  The patient scored a 14 on his PHQ-9.  He states that he does see a psychiatrist but has not seen him in six months. Advised the patient to call and get an appointment.  The patient verbalized understanding.  Medications: The patient states that he is on four medications.  He states that since he lost his job he is on a limited income and needs help paying for his Lyrica that cost him forty-five dollars.  He states that he is able to manage his medicines.  Appointments: The patient states that he saw his primary care Yesterday.   Plan: RN Health Coach will close the case at this time.  I will send a referral to Pharmacy for medication assistance for Lyrica and  a referral to social work for transportation and housing assistance.   Juanell Fairlyraci Patryk Conant RN, BSN, [facility] RN Health Coach Disease Management [facility] Direct Dial:  [phone] Fax: [phone]

## 2018-09-06 NOTE — Telephone Encounter (Signed)
Call patient to schedule OV.

## 2018-09-07 ENCOUNTER — Other Ambulatory Visit: Payer: Self-pay | Admitting: *Deleted

## 2018-09-07 NOTE — Patient Outreach (Signed)
[facility]) Care Management  09/07/2018  Adrian Cox 09/13/1961 [id number]  Patient referred to this social worker to assist with housing resources and transportation. Per referral, patient recently lost his job and he is going through a divorce and needs to find alternative housing. Patient answered the phone, however the reception was bad. Patient requested that this social worker call him on Monday morning.  Plan: This social worker will call patient on 09/10/18 to discuss community resource needs.  Adriana ReamsChrystal Land, LCSW [facility] Care Management [phone]

## 2018-09-10 ENCOUNTER — Other Ambulatory Visit: Payer: Self-pay | Admitting: *Deleted

## 2018-09-10 ENCOUNTER — Telehealth: Payer: Self-pay

## 2018-09-10 NOTE — Patient Outreach (Signed)
[facility]) Care Management  09/10/2018  Adrian Cox 09-Oct-1961 [id number]   Patient referred to this social worker to assist with housing and. transportation needs.Per patient, he is currently living with his estranged wife who works as a Water engineernurse technician for a ALF, and his wife's father. Per patient, he is currently on disability and has recently lost income at [employer] due to being bit by a dog in 5/19 which caused an eviction from his last apartment.  Per patient, due to the dog bite he suffers from nerve damage and cannot stand or sit for long periods of time. He is currently being followed by pain management. [facility] but they were not able to assist him.  Per patient, he would like resources to move but understands the difficulty due to his credit history and limited income. This Child psychotherapistsocial worker discussed Section 8 housing and low income housing options, however patient states that at this time he would rather stay where he is. Per patient, he gave up his Section 8 several years ago and is not interested in re-applying. This Child psychotherapistsocial worker provided patient with emotional support related to his current situation. This Child psychotherapistsocial worker will sign however, encouraged patient to call if there are any additional questions or concerns.  Adriana ReamsChrystal Silena Wyss, LCSW [facility] Care Management [phone]

## 2018-09-10 NOTE — Telephone Encounter (Addendum)
Pt was seen 09/05/18; pt started prednisone 20 mg taking 3 tabs per day for 5 days on 08/17/18. Pt is supposed to start taking prednisone 20 mg taking 2 tabs per day on 09/11/18. Pt has been taking prednisone 20 mg taking 1 tab q 4 hrs x 3 tabs per day; pt said he could not stand to take all 3 tabs at one time; pt having stomach cramping that is on and off day and night. Pt has been taking Emetrol for occasional nausea and ginger root daily which helps the stomach cramps. No N&V & diarrhea now. Pt has taken prednisone 10 mg before and that did not cause his stomach to cramp. Pt wants to know if should decrease prednisone or stop prednisone or what to do. [facility].  Pt request cb on 09/11/18.

## 2018-09-11 ENCOUNTER — Other Ambulatory Visit: Payer: Self-pay | Admitting: Pharmacist

## 2018-09-11 NOTE — Telephone Encounter (Signed)
Okay.. Have him decrease prednisone to 1 tab of 20 mg daily x 5 days then 1/2 tab of 20 mg x 5 days then off.

## 2018-09-11 NOTE — Patient Outreach (Signed)
[facility]) Care Management [place]  09/11/2018  Adrian Cox 1961-06-10 [id number]  Reason for referral: medication assistance  [facility] pharmacy case is being closed due to the following reasons:  Goals have been met.  Patient states he is able to afford generic Lyrica and has no additional pharmacy needs at this time.  Patient has been provided [facility] CM contact information if assistance needed in the future.    Thank you for allowing [facility] pharmacy to be involved in this patient's care.    Regina Eck, PharmD, [place]  [phone]

## 2018-09-11 NOTE — Telephone Encounter (Signed)
Mr. Adrian Cox notified as instructed by telephone.

## 2018-09-12 ENCOUNTER — Ambulatory Visit: Payer: Self-pay | Admitting: Pharmacist

## 2018-09-21 ENCOUNTER — Ambulatory Visit (INDEPENDENT_AMBULATORY_CARE_PROVIDER_SITE_OTHER): Payer: Medicare HMO | Admitting: Physical Medicine and Rehabilitation

## 2018-09-26 ENCOUNTER — Ambulatory Visit (INDEPENDENT_AMBULATORY_CARE_PROVIDER_SITE_OTHER): Payer: Self-pay | Admitting: Physical Medicine and Rehabilitation

## 2018-10-23 ENCOUNTER — Ambulatory Visit (INDEPENDENT_AMBULATORY_CARE_PROVIDER_SITE_OTHER): Payer: Self-pay | Admitting: Physical Medicine and Rehabilitation

## 2018-10-24 ENCOUNTER — Other Ambulatory Visit: Payer: Self-pay

## 2018-10-24 NOTE — Telephone Encounter (Signed)
Last office visit 09/05/2018 for lumbar radiculopathy.  Last refilled 09/05/2018 for #30 with no refills.  CPE scheduled 11/20/2018.  UDS/Contract 11/09/2017.  See MyChart message.

## 2018-10-25 MED ORDER — OXYCODONE HCL 5 MG PO CAPS: 5.0000 mg | ORAL_CAPSULE | Freq: Two times a day (BID) | ORAL | 0 refills | Status: DC | PRN

## 2018-10-25 NOTE — Telephone Encounter (Signed)
At upcoming OV  Will set pt up on 3 month pain management visits.

## 2018-11-16 ENCOUNTER — Ambulatory Visit: Payer: Self-pay

## 2018-11-16 ENCOUNTER — Telehealth: Payer: Self-pay | Admitting: Family Medicine

## 2018-11-16 DIAGNOSIS — Z125 Encounter for screening for malignant neoplasm of prostate: Secondary | ICD-10-CM

## 2018-11-16 DIAGNOSIS — E785 Hyperlipidemia, unspecified: Secondary | ICD-10-CM

## 2018-11-16 NOTE — Telephone Encounter (Signed)
-----   Message from Robert Bellow, LPN sent at 6/46/8032  3:35 PM EST ----- Regarding: Labs 1/31 Lab orders needed. Thank you.

## 2018-11-19 ENCOUNTER — Ambulatory Visit (INDEPENDENT_AMBULATORY_CARE_PROVIDER_SITE_OTHER): Payer: Medicare Other

## 2018-11-19 VITALS — BP 150/90 | HR 70 | Temp 98.3°F | Ht 72.5 in | Wt 214.2 lb

## 2018-11-19 DIAGNOSIS — Z125 Encounter for screening for malignant neoplasm of prostate: Secondary | ICD-10-CM

## 2018-11-19 DIAGNOSIS — Z0000 Encounter for general adult medical examination without abnormal findings: Secondary | ICD-10-CM

## 2018-11-19 DIAGNOSIS — E785 Hyperlipidemia, unspecified: Secondary | ICD-10-CM

## 2018-11-19 LAB — COMPREHENSIVE METABOLIC PANEL
ALBUMIN: 4.5 g/dL (ref 3.5–5.2)
ALT: 46 U/L (ref 0–53)
AST: 42 U/L — ABNORMAL HIGH (ref 0–37)
Alkaline Phosphatase: 67 U/L (ref 39–117)
BUN: 8 mg/dL (ref 6–23)
CALCIUM: 9.5 mg/dL (ref 8.4–10.5)
CHLORIDE: 106 meq/L (ref 96–112)
CO2: 28 mEq/L (ref 19–32)
Creatinine, Ser: 0.78 mg/dL (ref 0.40–1.50)
GFR: 123.66 mL/min (ref >60.00)
Glucose, Bld: 98 mg/dL (ref 70–99)
POTASSIUM: 4.1 meq/L (ref 3.5–5.1)
Sodium: 142 mEq/L (ref 135–145)
TOTAL PROTEIN: 7.2 g/dL (ref 6.0–8.3)
Total Bilirubin: 0.8 mg/dL (ref 0.2–1.2)

## 2018-11-19 LAB — LIPID PANEL
Cholesterol: 169 mg/dL (ref 0–200)
HDL: 31 mg/dL — ABNORMAL LOW (ref >39.00)
LDL CALC: 105 mg/dL — AB (ref 0–99)
NonHDL: 137.87
TRIGLYCERIDES: 164 mg/dL — AB (ref 0.0–149.0)
Total CHOL/HDL Ratio: 5
VLDL: 32.8 mg/dL (ref 0.0–40.0)

## 2018-11-19 LAB — PSA, MEDICARE: PSA: 2.21 ng/ml (ref 0.10–4.00)

## 2018-11-19 NOTE — Progress Notes (Signed)
Subjective:   Adrian Cox is a 58 y.o. male who presents for Medicare Annual/Subsequent preventive examination.  Review of Systems:  N/A Cardiac Risk Factors include: advanced age (>6055men, 43>65 women);dyslipidemia;hypertension;male gender     Objective:    Vitals: BP (!) 150/90 (BP Location: Right Arm, Patient Position: Sitting, Cuff Size: Normal)   Pulse 70   Temp 98.3 F (36.8 C) (Oral)   Ht 6' 0.5" (1.842 m) Comment: shoes  Wt 214 lb 4 oz (97.2 kg)   SpO2 97%   BMI 28.66 kg/m   Body mass index is 28.66 kg/m.  Advanced Directives 11/19/2018 09/05/2018 08/15/2018 07/30/2018 05/06/2018 03/14/2018 12/05/2017  Does Patient Have a Medical Advance Directive? No No No No No No No  Type of Advance Directive - - - - - - -  Would patient like information on creating a medical advance directive? Yes (MAU/Ambulatory/Procedural Areas - Information given);No - Patient declined - - No - Patient declined No - Patient declined No - Patient declined No - Patient declined    Tobacco Social History   Tobacco Use  Smoking Status Never Smoker  Smokeless Tobacco Never Used     Counseling given: No   Clinical Intake:  Pre-visit preparation completed: Yes  Pain Score: 3      Nutritional Status: BMI 25 -29 Overweight Nutritional Risks: None Diabetes: No  How often do you need to have someone help you when you read instructions, pamphlets, or other written materials from your doctor or pharmacy?: 1 - Never What is the last grade level you completed in school?: 12th grade + 3 yrs college  Interpreter Needed?: No  Comments: pt lives with spouse Information entered by :: LPinson, LPN  Past Medical History:  Diagnosis Date  . Depression    bipolar  . Diverticulosis   . Hyperlipidemia   . Hypertension   . Mold exposure    Past Surgical History:  Procedure Laterality Date  . BACK SURGERY    . CERVICAL SPINE SURGERY  2003   fusion  . KNEE ARTHROSCOPY Right   . KNEE  ARTHROSCOPY Left   . SPINE SURGERY     Family History  Problem Relation Age of Onset  . Cancer Mother   . Colon cancer Mother [id number]   Social History   Socioeconomic History  . Marital status: Married    Spouse name: Not on file  . Number of children: Not on file  . Years of education: Not on file  . Highest education level: Not on file  Occupational History  . Not on file  Social Needs  . Financial resource strain: Not on file  . Food insecurity:    Worry: Not on file    Inability: Not on file  . Transportation needs:    Medical: Not on file    Non-medical: Not on file  Tobacco Use  . Smoking status: Never Smoker  . Smokeless tobacco: Never Used  Substance and Sexual Activity  . Alcohol use: No  . Drug use: Yes    Types: Marijuana    Comment: Hx marijuana use - none x 2 yrs  . Sexual activity: Yes  Lifestyle  . Physical activity:    Days per week: Not on file    Minutes per session: Not on file  . Stress: Not on file  Relationships  . Social connections:    Talks on phone: Not on file    Gets together: Not on file    Attends religious  service: Not on file    Active member of club or organization: Not on file    Attends meetings of clubs or organizations: Not on file    Relationship status: Not on file  Other Topics Concern  . Not on file  Social History Narrative   Patient is married, has no children. He does not smoke drink or use any substances. He has a high school education. He is currently not employed. He does some walking, but really does not get a lot of regular exercise. He has membership at [facility] [employer] but he doesn't use it    Outpatient Encounter Medications as of 11/19/2018  Medication Sig  . cholecalciferol (VITAMIN D) 1000 units tablet Take 1,000 Units by mouth daily.  Marland Kitchen FIBER PO Take 4 tablets by mouth daily.  . Multiple Vitamin (MULTIVITAMIN WITH MINERALS) TABS tablet Take 1 tablet by mouth daily.  Marland Kitchen oxycodone (OXY-IR) 5 MG capsule Take 1 capsule (5  mg total) by mouth 2 (two) times daily as needed.  . pregabalin (LYRICA) 150 MG capsule TAKE 1 CAPSULE BY MOUTH TWICE A DAY  . [DISCONTINUED] cyclobenzaprine (FLEXERIL) 10 MG tablet Take 0.5-1 tablets (5-10 mg total) by mouth at bedtime.  . [DISCONTINUED] omega-3 acid ethyl esters (LOVAZA) 1 g capsule Take 2 g by mouth daily.  . [DISCONTINUED] predniSONE (DELTASONE) 20 MG tablet 3 tabs by mouth daily x 5 days, then 2 tabs by mouth daily x 5 days then 1 tab by mouth daily x 5 days   No facility-administered encounter medications on file as of 11/19/2018.     Activities of Daily Living In your present state of health, do you have any difficulty performing the following activities: 11/19/2018  Hearing? N  Vision? N  Difficulty concentrating or making decisions? N  Walking or climbing stairs? N  Dressing or bathing? N  Doing errands, shopping? N  Preparing Food and eating ? N  Using the Toilet? N  In the past six months, have you accidently leaked urine? N  Do you have problems with loss of bowel control? N  Managing your Medications? N  Managing your Finances? N  Housekeeping or managing your Housekeeping? N  Some recent data might be hidden    Patient Care Team: Excell Seltzer, MD as PCP - General (Family Medicine)   Assessment:   This is a routine wellness examination for Adrian Cox.   Hearing Screening   125Hz  250Hz  500Hz  1000Hz  2000Hz  3000Hz  4000Hz  6000Hz  8000Hz   Right ear:   40 40 40  40    Left ear:   40 40 40  40    Vision Screening Comments: Vision exam in 2019 with Dr. Caryn Section   Exercise Activities and Dietary recommendations Current Exercise Habits: Home exercise routine, Type of exercise: Other - see comments;strength training/weights;stretching;treadmill;walking;exercise ball(cardio), Time (Minutes): 60, Frequency (Times/Week): 7, Weekly Exercise (Minutes/Week): 420, Intensity: Moderate, Exercise limited by: None identified  Goals    . Increase physical activity     Starting  11/19/2018, I will continue to exercise at least 60 minutes daily.        Fall Risk Fall Risk  11/19/2018 11/14/2017 01/03/2017  Falls in the past year? 1 No No  Comment multiple falls due to sciatic pain and weakness of legs - -  Number falls in past yr: 1 - -  Injury with Fall? 0 - -   Depression Screen PHQ 2/9 Scores 11/19/2018 09/06/2018 11/14/2017 01/03/2017  PHQ - 2 Score 2 2 1  0  PHQ- 9 Score 6 14 - -    Cognitive Function MMSE - Mini Mental State Exam 11/19/2018 01/03/2017  Orientation to time 5 5  Orientation to Place 5 5  Registration 3 3  Attention/ Calculation 0 0  Recall 3 3  Language- name 2 objects 0 0  Language- repeat 1 1  Language- follow 3 step command 3 3  Language- read & follow direction 0 0  Write a sentence 0 0  Copy design 0 0  Total score 20 20       PLEASE NOTE: A Mini-Cog screen was completed. Maximum score is 20. A value of 0 denotes this part of Folstein MMSE was not completed or the patient failed this part of the Mini-Cog screening.   Mini-Cog Screening Orientation to Time - Max 5 pts Orientation to Place - Max 5 pts Registration - Max 3 pts Recall - Max 3 pts Language Repeat - Max 1 pts Language Follow 3 Step Command - Max 3 pts   Immunization History  Administered Date(s) Administered  . Influenza,inj,Quad PF,6+ Mos 06/26/2018  . Influenza-Unspecified 07/08/2016, 06/04/2017  . Tdap 11/14/2017    Screening Tests Health Maintenance  Topic Date Due  . COLONOSCOPY  07/18/2022  . TETANUS/TDAP  11/15/2027  . INFLUENZA VACCINE  Completed  . Hepatitis C Screening  Completed  . HIV Screening  Completed       Plan:     I have personally reviewed, addressed, and noted the following in the patient's chart:  A. Medical and social history B. Use of alcohol, tobacco or illicit drugs  C. Current medications and supplements D. Functional ability and status E.  Nutritional status F.  Physical activity G. Advance directives H. List of other  physicians I.  Hospitalizations, surgeries, and ER visits in previous 12 months J.  Vitals K. Screenings to include hearing, vision, cognitive, depression L. Referrals and appointments - none  In addition, I have reviewed and discussed with patient certain preventive protocols, quality metrics, and best practice recommendations. A written personalized care plan for preventive services as well as general preventive health recommendations were provided to patient.  See attached scanned questionnaire for additional information.   Signed,   Randa EvensLesia Pinson, MHA, BS, LPN Health Coach

## 2018-11-19 NOTE — Progress Notes (Signed)
PCP notes:   Health maintenance:  No gaps identified.  Abnormal screenings:   Fall risk - hx of multiple falls Fall Risk  11/19/2018 11/14/2017 01/03/2017  Falls in the past year? 1 No No  Comment multiple falls due to sciatic pain and weakness of legs - -  Number falls in past yr: 1 - -  Injury with Fall? 0 - -   Depression score: 6 Depression screen [facility] 2/9 11/19/2018 09/06/2018 11/14/2017 01/03/2017 04/20/2015  Decreased Interest 1 1 0 0 0  Down, Depressed, Hopeless 1 1 1  0 0  PHQ - 2 Score 2 2 1  0 0  Altered sleeping 1 2 - - -  Tired, decreased energy 0 1 - - -  Change in appetite 2 2 - - -  Feeling bad or failure about yourself  0 2 - - -  Trouble concentrating 1 1 - - -  Moving slowly or fidgety/restless 0 3 - - -  Suicidal thoughts 0 1 - - -  PHQ-9 Score 6 14 - - -  Difficult doing work/chores Not difficult at all - - - -   Patient concerns:   None  Nurse concerns:  None  Next PCP appt:   11/20/18 @ 1000

## 2018-11-19 NOTE — Patient Instructions (Signed)
Adrian Cox , Thank you for taking time to come for your Medicare Wellness Visit. I appreciate your ongoing commitment to your health goals. Please review the following plan we discussed and let me know if I can assist you in the future.   These are the goals we discussed: Goals    . Increase physical activity     Starting 11/19/2018, I will continue to exercise at least 60 minutes daily.        This is a list of the screening recommended for you and due dates:  Health Maintenance  Topic Date Due  . Colon Cancer Screening  07/18/2022  . Tetanus Vaccine  11/15/2027  . Flu Shot  Completed  .  Hepatitis C: One time screening is recommended by Center for Disease Control  (CDC) for  adults born from [birth year] through 1966.   Completed  . HIV Screening  Completed   Preventive Care for Adults  A healthy lifestyle and preventive care can promote health and wellness. Preventive health guidelines for adults include the following key practices.  . A routine yearly physical is a good way to check with your health care provider about your health and preventive screening. It is a chance to share any concerns and updates on your health and to receive a thorough exam.  . Visit your dentist for a routine exam and preventive care every 6 months. Brush your teeth twice a day and floss once a day. Good oral hygiene prevents tooth decay and gum disease.  . The frequency of eye exams is based on your age, health, family medical history, use  of contact lenses, and other factors. Follow your health care provider's recommendations for frequency of eye exams.  . Eat a healthy diet. Foods like vegetables, fruits, whole grains, low-fat dairy products, and lean protein foods contain the nutrients you need without too many calories. Decrease your intake of foods high in solid fats, added sugars, and salt. Eat the right amount of calories for you. Get information about a proper diet from your health care provider, if  necessary.  . Regular physical exercise is one of the most important things you can do for your health. Most adults should get at least 150 minutes of moderate-intensity exercise (any activity that increases your heart rate and causes you to sweat) each week. In addition, most adults need muscle-strengthening exercises on 2 or more days a week.  Silver Sneakers may be a benefit available to you. To determine eligibility, you may visit the website: www.silversneakers.com or contact program at 951-560-0455 Mon-Fri between 8AM-8PM.   . Maintain a healthy weight. The body mass index (BMI) is a screening tool to identify possible weight problems. It provides an estimate of body fat based on height and weight. Your health care provider can find your BMI and can help you achieve or maintain a healthy weight.   For adults 20 years and older: ? A BMI below 18.5 is considered underweight. ? A BMI of 18.5 to 24.9 is normal. ? A BMI of 25 to 29.9 is considered overweight. ? A BMI of 30 and above is considered obese.   . Maintain normal blood lipids and cholesterol levels by exercising and minimizing your intake of saturated fat. Eat a balanced diet with plenty of fruit and vegetables. Blood tests for lipids and cholesterol should begin at age 56 and be repeated every 5 years. If your lipid or cholesterol levels are high, you are over 50, or you are at  high risk for heart disease, you may need your cholesterol levels checked more frequently. Ongoing high lipid and cholesterol levels should be treated with medicines if diet and exercise are not working.  . If you smoke, find out from your health care provider how to quit. If you do not use tobacco, please do not start.  . If you choose to drink alcohol, please do not consume more than 2 drinks per day. One drink is considered to be 12 ounces (355 mL) of beer, 5 ounces (148 mL) of wine, or 1.5 ounces (44 mL) of liquor.  . If you are 37-25 years old, ask your  health care provider if you should take aspirin to prevent strokes.  . Use sunscreen. Apply sunscreen liberally and repeatedly throughout the day. You should seek shade when your shadow is shorter than you. Protect yourself by wearing long sleeves, pants, a wide-brimmed hat, and sunglasses year round, whenever you are outdoors.  . Once a month, do a whole body skin exam, using a mirror to look at the skin on your back. Tell your health care provider of new moles, moles that have irregular borders, moles that are larger than a pencil eraser, or moles that have changed in shape or color.

## 2018-11-19 NOTE — Progress Notes (Signed)
I reviewed health advisor's note, was available for consultation, and agree with documentation and plan.   Signed,  Trini Soldo T. Ceniya Fowers, MD  

## 2018-11-20 ENCOUNTER — Ambulatory Visit (INDEPENDENT_AMBULATORY_CARE_PROVIDER_SITE_OTHER): Payer: Self-pay | Admitting: Physical Medicine and Rehabilitation

## 2018-11-20 ENCOUNTER — Ambulatory Visit (INDEPENDENT_AMBULATORY_CARE_PROVIDER_SITE_OTHER): Payer: Medicare Other | Admitting: Family Medicine

## 2018-11-20 ENCOUNTER — Encounter: Payer: Self-pay | Admitting: Family Medicine

## 2018-11-20 VITALS — BP 120/60 | HR 74 | Temp 98.4°F | Ht 72.5 in | Wt 216.0 lb

## 2018-11-20 DIAGNOSIS — G894 Chronic pain syndrome: Secondary | ICD-10-CM | POA: Insufficient documentation

## 2018-11-20 DIAGNOSIS — F1210 Cannabis abuse, uncomplicated: Secondary | ICD-10-CM

## 2018-11-20 DIAGNOSIS — M5440 Lumbago with sciatica, unspecified side: Secondary | ICD-10-CM

## 2018-11-20 DIAGNOSIS — G8929 Other chronic pain: Secondary | ICD-10-CM

## 2018-11-20 DIAGNOSIS — F319 Bipolar disorder, unspecified: Secondary | ICD-10-CM

## 2018-11-20 DIAGNOSIS — E785 Hyperlipidemia, unspecified: Secondary | ICD-10-CM

## 2018-11-20 DIAGNOSIS — R7989 Other specified abnormal findings of blood chemistry: Secondary | ICD-10-CM | POA: Insufficient documentation

## 2018-11-20 DIAGNOSIS — R945 Abnormal results of liver function studies: Secondary | ICD-10-CM

## 2018-11-20 DIAGNOSIS — I10 Essential (primary) hypertension: Secondary | ICD-10-CM | POA: Diagnosis not present

## 2018-11-20 DIAGNOSIS — Z0000 Encounter for general adult medical examination without abnormal findings: Secondary | ICD-10-CM

## 2018-11-20 MED ORDER — OXYCODONE HCL 5 MG PO CAPS: 5.0000 mg | ORAL_CAPSULE | Freq: Every day | ORAL | 0 refills | Status: DC

## 2018-11-20 NOTE — Progress Notes (Addendum)
Subjective:    Patient ID: Adrian Cox, male    DOB: Oct 07, 1961, 58 y.o.   MRN: [id number]  HPI The patient presents for complete physical and review of chronic health problems. He/She also has the following acute concerns today: none  The patient saw Lu Duffel, LPN for medicare wellness. Note reviewed in detail and important notes copied below.  No gaps identified.  Abnormal screenings:   Fall risk - hx of multiple falls Fall Risk  11/19/2018 11/14/2017 01/03/2017  Falls in the past year? 1 No No  Comment multiple falls due to sciatic pain and weakness of legs - -  Number falls in past yr: 1 - -  Injury with Fall? 0 - -   Depression score: 6 Depression screen [facility] 2/9 11/19/2018 09/06/2018 11/14/2017 01/03/2017 04/20/2015  Decreased Interest 1 1 0 0 0  Down, Depressed, Hopeless 1 1 1  0 0  PHQ - 2 Score 2 2 1  0 0  Altered sleeping 1 2 - - -  Tired, decreased energy 0 1 - - -  Change in appetite 2 2 - - -  Feeling bad or failure about yourself  0 2 - - -  Trouble concentrating 1 1 - - -  Moving slowly or fidgety/restless 0 3 - - -  Suicidal thoughts 0 1 - - -  PHQ-9 Score 6 14 - - -  Difficult doing work/chores Not difficult at all         Elevated Cholesterol:  7 % 10 year risk of CVD. Lab Results  Component Value Date   CHOL 169 11/19/2018   HDL 31.00 (L) 11/19/2018   LDLCALC 105 (H) 11/19/2018   LDLDIRECT 98.0 08/17/2016   TRIG 164.0 (H) 11/19/2018   CHOLHDL 5 11/19/2018  Using medications without problems: Muscle aches:  Diet compliance: moderate Exercise: walking some occ, getting back into golf. Other complaints:  Hypertension:    Good control on BP on  No medication. BP Readings from Last 3 Encounters:  11/20/18 120/60  11/19/18 (!) 150/90  09/05/18 (!) 133/100  Using medication without problems or lightheadedness:  none Chest pain with exertion:none Edema:none Short of breath:none Average home BPs: Other issues:   Chronic pain: On lyrica  150 mg  BID.  He is using 1 cap as needed for flares... recently once a week. He has 15 capsules left. Indication for chronic opioid:  Chronic back apoin and right thigh pain form dog bite. Medication and dose: oxycodone 5 mg daily # pills per month: < 30 Last UDS date: 11/09/2017 Opioid Treatment Agreement signed (Y/N):   Today.. renew Opioid Treatment Agreement last reviewed with patient:   Y 11/20/2018 [facility] reviewed this encounter (include red flags):   no red flags, reviewed     Liver function test slightly elevated AST42. Has been taking some acetaminophen. No ETOH.  no family history of liver issues.  Neg Hep C in past.   Bipolar disorder:  He has been off medication for this > 5 years ago. He denies any   Followed by counsleing.. Dr. Salli Real.  Social History /Family History/Past Medical History reviewed in detail and updated in EMR if needed. Blood pressure 120/60, pulse 74, temperature 98.4 F (36.9 C), temperature source Oral, height 6' 0.5" (1.842 m), weight 216 lb (98 kg), SpO2 93 %.   Review of Systems  Constitutional: Negative for fatigue and fever.  HENT: Negative for ear pain.   Eyes: Negative for pain.  Respiratory: Negative for cough and  shortness of breath.   Cardiovascular: Negative for chest pain, palpitations and leg swelling.  Gastrointestinal: Negative for abdominal pain.  Genitourinary: Negative for dysuria.  Musculoskeletal: Negative for arthralgias.  Neurological: Negative for syncope, light-headedness and headaches.  Psychiatric/Behavioral: Negative for dysphoric mood.       Objective:   Physical Exam Constitutional:      General: He is not in acute distress.    Appearance: Normal appearance. He is well-developed. He is not ill-appearing or toxic-appearing.  HENT:     Head: Normocephalic and atraumatic.     Right Ear: Hearing, tympanic membrane, ear canal and external ear normal.     Left Ear: Hearing, tympanic membrane, ear canal and  external ear normal.     Nose: Nose normal.     Mouth/Throat:     Pharynx: Uvula midline.  Eyes:     General: Lids are normal. Lids are everted, no foreign bodies appreciated.     Conjunctiva/sclera: Conjunctivae normal.     Pupils: Pupils are equal, round, and reactive to light.  Neck:     Musculoskeletal: Normal range of motion and neck supple.     Thyroid: No thyroid mass or thyromegaly.     Vascular: No carotid bruit.     Trachea: Trachea and phonation normal.  Cardiovascular:     Rate and Rhythm: Normal rate and regular rhythm.     Pulses: Normal pulses.     Heart sounds: S1 normal and S2 normal. No murmur. No gallop.   Pulmonary:     Breath sounds: Normal breath sounds. No wheezing, rhonchi or rales.  Abdominal:     General: Bowel sounds are normal.     Palpations: Abdomen is soft.     Tenderness: There is no abdominal tenderness. There is no guarding or rebound.     Hernia: No hernia is present.  Lymphadenopathy:     Cervical: No cervical adenopathy.  Skin:    General: Skin is warm and dry.     Findings: No rash.  Neurological:     Mental Status: He is alert.     Cranial Nerves: No cranial nerve deficit.     Sensory: No sensory deficit.     Gait: Gait normal.     Deep Tendon Reflexes: Reflexes are normal and symmetric.  Psychiatric:        Speech: Speech normal.        Behavior: Behavior normal.        Judgment: Judgment normal.           Assessment & Plan:  The patient's preventative maintenance and recommended screening tests for an annual wellness exam were reviewed in full today. Brought up to date unless services declined.  Counselled on the importance of diet, exercise, and its role in overall health and mortality. The patient's FH and SH was reviewed, including their home life, tobacco status, and drug and alcohol status.    Vaccines uptodate with flu and Tdap: Prostate Cancer Screen:  Increase in PSA in last year.. follow  Lab Results  Component  Value Date   PSA 2.21 11/19/2018   PSA 1.63 11/14/2017   PSA 1.98 08/17/2016  Colon Cancer Screen: 07/2017, repeat in 5 years      Smoking Status:none ETOH/ drug use: No ETOH/ does use marijuana  Hep C: done  HIV screen:  done

## 2018-11-20 NOTE — Assessment & Plan Note (Signed)
Encouraged pt to stop... using for pain... he reports he is decreasing use.

## 2018-11-20 NOTE — Assessment & Plan Note (Signed)
On lyrica 150 mg  BID.  He is using 1 cap as needed for flares... recently once a week. He has 15 capsules left. Indication for chronic opioid:  Chronic back apin and right thigh pain form dog bite. Medication and dose: oxycodone 5 mg daily # pills per month: < 30 Last UDS date: 11/09/2017 Opioid Treatment Agreement signed (Y/N):   Today.. renew Opioid Treatment Agreement last reviewed with patient:   Y 11/20/2018 [facility] reviewed this encounter (include red flags):   no red flags, reviewed   Follow up in 3 months... Rx from today should last until 3 month follow up.

## 2018-11-20 NOTE — Patient Instructions (Addendum)
Stop acetaminophen (tylenol).  Return in 2-4 weeks for lab only to do a hepatic panel.  get back to regualr exercise as tolerated.

## 2018-11-20 NOTE — Assessment & Plan Note (Signed)
7 % 10 year risk. Encouraged exercise, weight loss, healthy eating habits.

## 2018-11-20 NOTE — Assessment & Plan Note (Signed)
Stable control on lyrica. 

## 2018-11-20 NOTE — Assessment & Plan Note (Signed)
Stop tylenol and re-eval in 2-4 weeks. No ETOH.

## 2018-11-20 NOTE — Assessment & Plan Note (Signed)
Good control on no med in office today.

## 2018-11-20 NOTE — Assessment & Plan Note (Signed)
On no med.. doing okay, no mania off medication.  Going  To counseling off and on.\ No Si, no HI.

## 2018-11-22 ENCOUNTER — Ambulatory Visit (INDEPENDENT_AMBULATORY_CARE_PROVIDER_SITE_OTHER): Payer: Self-pay | Admitting: Physical Medicine and Rehabilitation

## 2018-11-22 ENCOUNTER — Encounter (INDEPENDENT_AMBULATORY_CARE_PROVIDER_SITE_OTHER): Payer: Self-pay

## 2018-11-23 LAB — PAIN MGMT, PROFILE 8 W/CONF, U
6 ACETYLMORPHINE: NEGATIVE ng/mL (ref <10)
Alcohol Metabolites: NEGATIVE ng/mL (ref <500)
Amphetamines: NEGATIVE ng/mL (ref <500)
Benzodiazepines: NEGATIVE ng/mL (ref <100)
Buprenorphine, Urine: NEGATIVE ng/mL (ref <5)
Cocaine Metabolite: NEGATIVE ng/mL (ref <150)
Creatinine: 98.9 mg/dL
MDMA: NEGATIVE ng/mL (ref <500)
Marijuana Metabolite: >500 ng/mL — ABNORMAL HIGH (ref <5)
Marijuana Metabolite: POSITIVE ng/mL — AB (ref <20)
Opiates: NEGATIVE ng/mL (ref <100)
Oxidant: NEGATIVE ug/mL (ref <200)
Oxycodone: NEGATIVE ng/mL (ref <100)
pH: 6.9 (ref 4.5–9.0)

## 2018-12-07 ENCOUNTER — Other Ambulatory Visit: Payer: Self-pay | Admitting: Family Medicine

## 2018-12-07 NOTE — Telephone Encounter (Signed)
Last office visit 11/20/2018 for CPE.  Last refilled 08/20/2018 for #60 with 2 refills. Next Appt:  11/29/2019 for CPE.

## 2018-12-08 IMAGING — MR MR LUMBAR SPINE WO/W CM
6 of 7 series · 34 of 48 positions shown · IV contrast (multihance)
Comparison: Lumbar spine radiographs November 09, 2017

CLINICAL DATA: Severe low back pain radiating to bilateral lower
extremities for many years. History of cervical and lumbar spine
surgery.

EXAM:
MRI LUMBAR SPINE WITHOUT AND WITH CONTRAST
TECHNIQUE: Multiplanar and multiecho pulse sequences of the lumbar spine were
obtained without and with intravenous contrast.
CONTRAST:  19mL MULTIHANCE GADOBENATE DIMEGLUMINE 529 MG/ML IV SOLN

[Series 3: T2 · sagittal · 4.0mm · 0.81mm/px · 5 of 17 slices shown (1 of 2)]
[im 1/17]
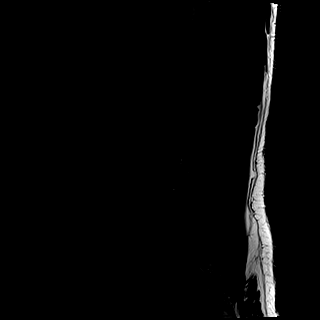
[im 5/17]
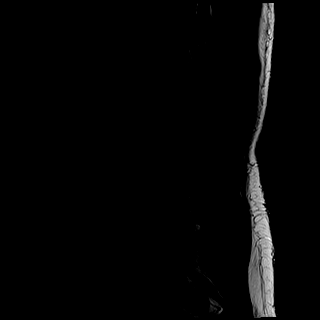
[im 9/17]
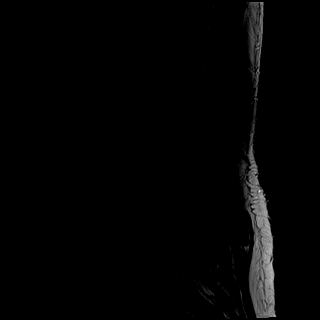
[im 13/17]
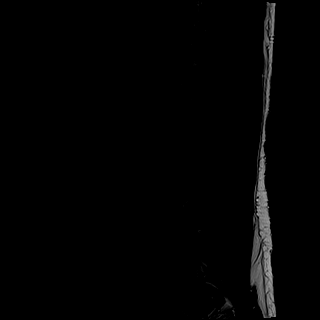
[im 17/17]
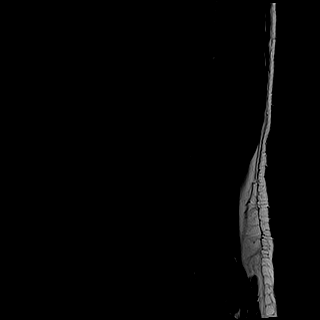

[Series 4: T1 · sagittal · 4.0mm · 0.81mm/px · 5 of 17 slices shown (1 of 2)]
[im 1/17]
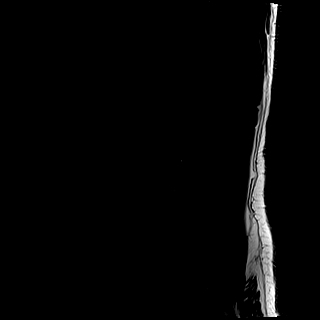
[im 5/17]
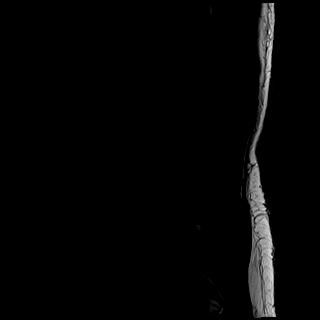
[im 9/17]
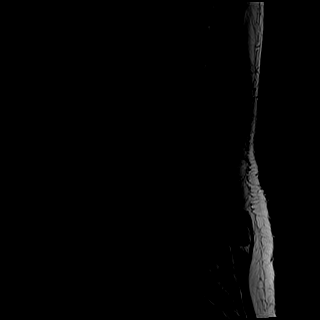
[im 13/17]
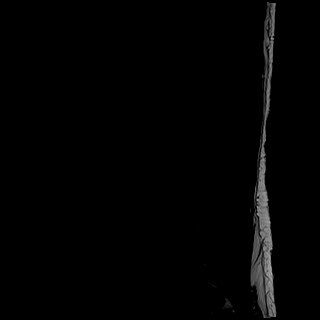
[im 17/17]
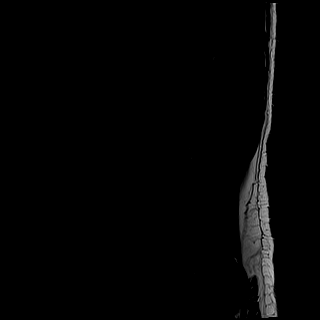

[Series 6: T2 · axial · 4.0mm · 0.78mm/px · z∈[-80,+145]mm · 8 of 40 slices shown (2 of 2)]
[im 1/40]
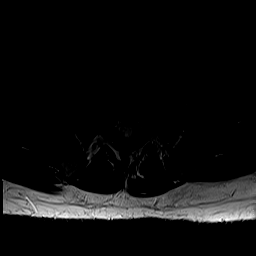
[im 5/40]
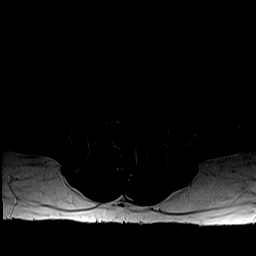
[im 14/40]
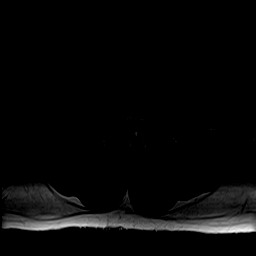
[im 18/40]
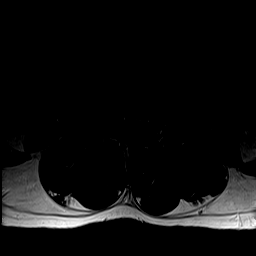
[im 22/40]
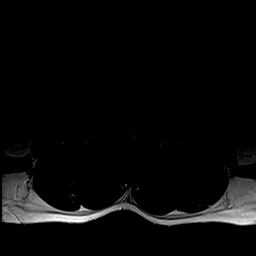
[im 27/40]
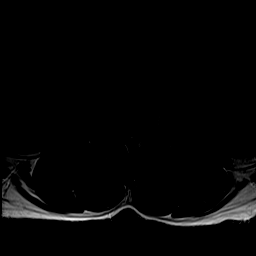
[im 35/40]
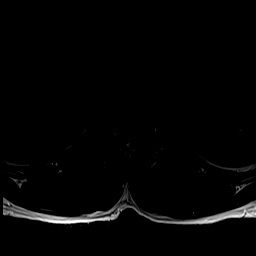
[im 40/40]
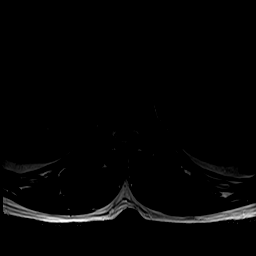

[Series 7: T1 · axial · 4.0mm · 0.39mm/px · z∈[-80,+145]mm · 8 of 40 slices shown (2 of 2)]
[im 1/40]
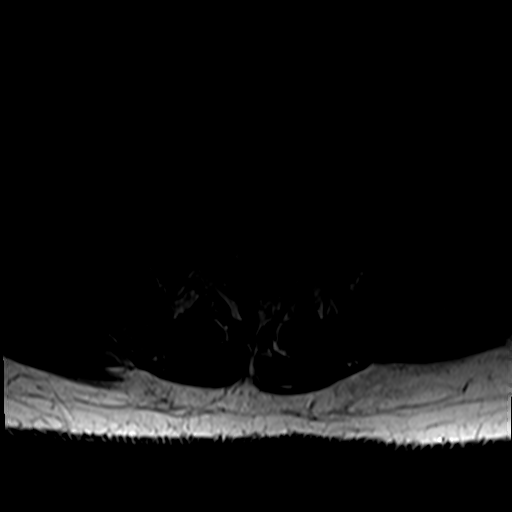
[im 5/40]
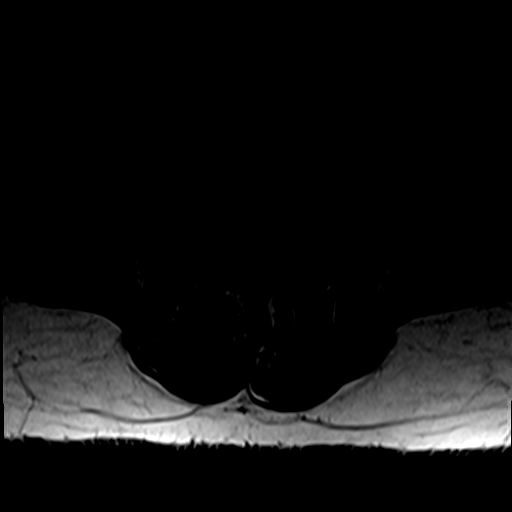
[im 14/40]
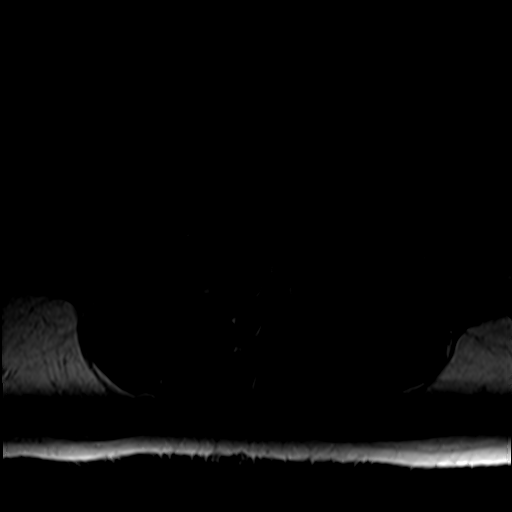
[im 18/40]
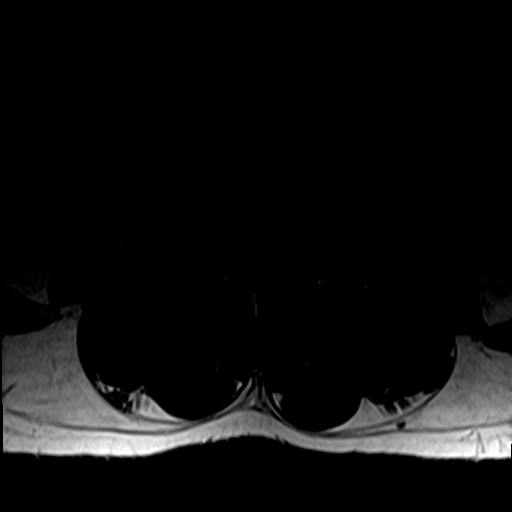
[im 22/40]
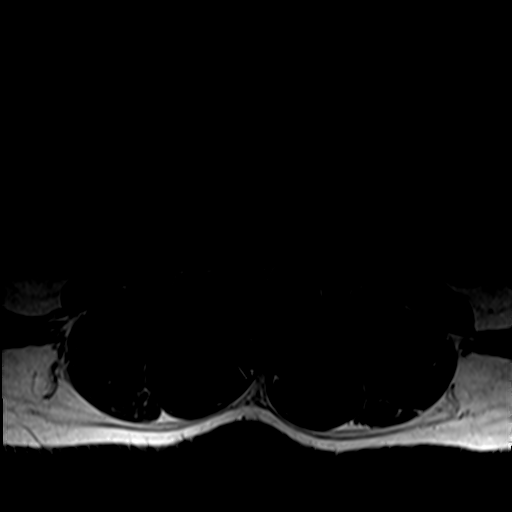
[im 27/40]
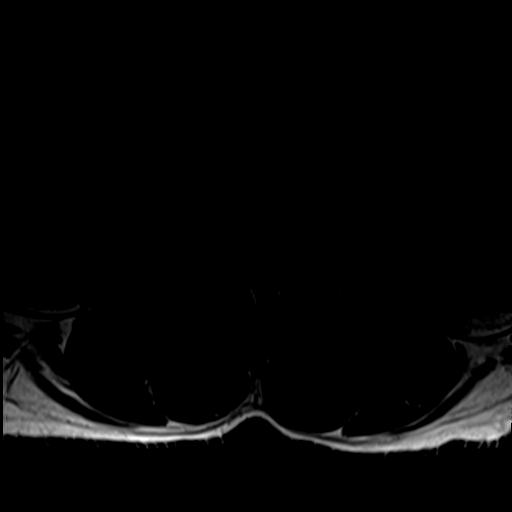
[im 35/40]
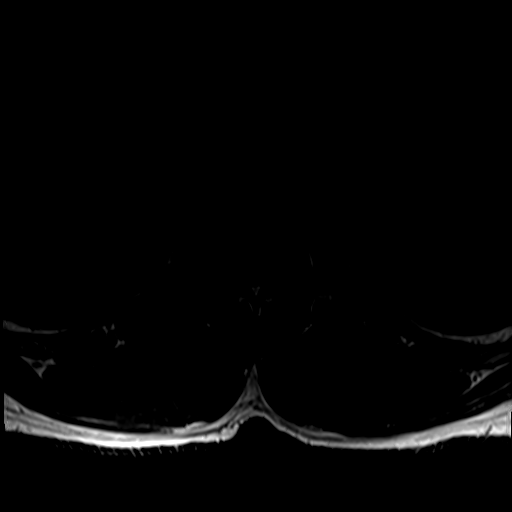
[im 40/40]
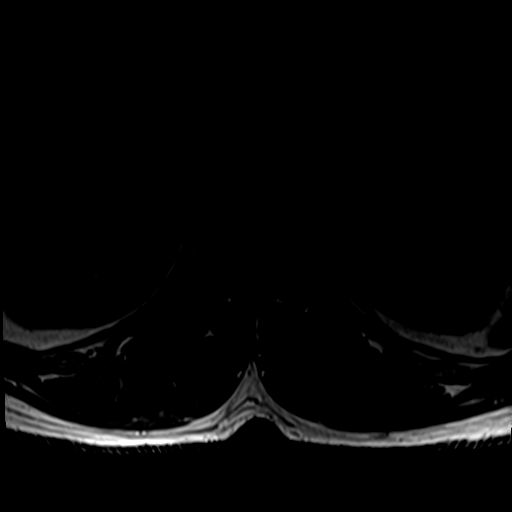

[Series 8: STIR · sagittal · 4.0mm · 1.02mm/px · 4 of 17 slices shown]
[im 1/17]
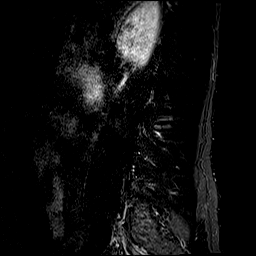
[im 6/17]
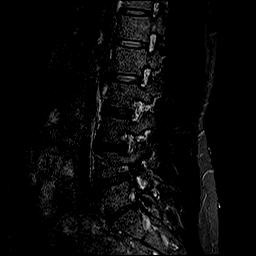
[im 11/17]
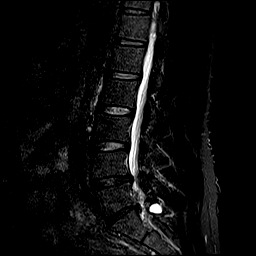
[im 17/17]
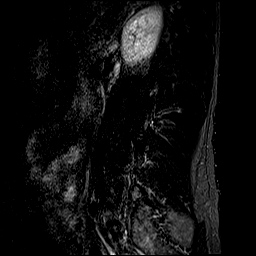

[Series 9: T1 fat-sat post-contrast · sagittal · 4.0mm · 0.81mm/px · 4 of 17 slices shown]
[im 1/17]
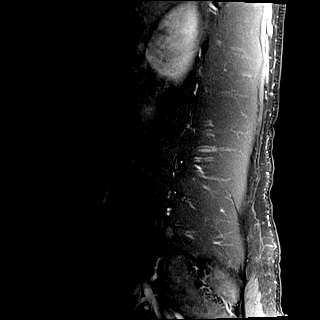
[im 6/17]
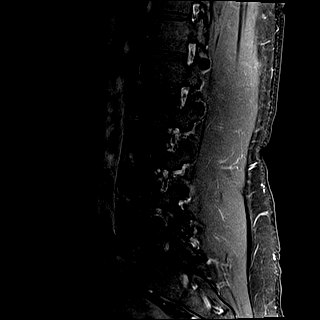
[im 11/17]
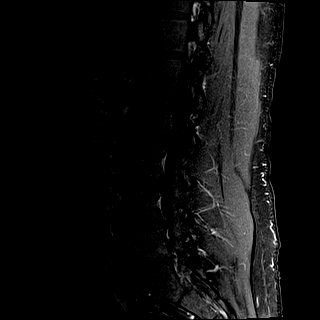
[im 17/17]
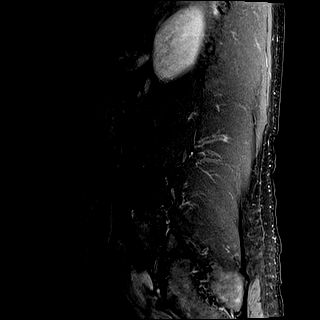

[34 of 48 positions shown; findings below may reference images not displayed]

FINDINGS: SEGMENTATION: For the purposes of this report, the last well-formed
intervertebral disc is reported as L5-S1.

ALIGNMENT: Grade 1 L4-5 anterolisthesis and grade 1 L5-S1
retrolisthesis.

VERTEBRAE: Vertebral bodies are intact. Moderate L4-5, moderate to
severe L5-S1 disc height loss with disc desiccation compatible with
degenerative discs. Mild associated chronic discogenic endplate
changes. No acute abnormal bone marrow signal. No suspicious osseous
or disc enhancement. L2 superior endplate old Schmorl's node.

CONUS MEDULLARIS AND CAUDA EQUINA: Conus medullaris terminates at
T12-L1 and demonstrates normal morphology and signal
characteristics. Cauda equina is normal. No abnormal cord,
leptomeningeal or epidural enhancement.

PARASPINAL AND OTHER SOFT TISSUES: Included prevertebral and
paraspinal soft tissues are normal.

DISC LEVELS:

T12-L1, L1-2, L2-3: No disc bulge, canal stenosis nor neural
foraminal narrowing.

L3-4: Annular bulging, small RIGHT extraforaminal disc protrusion.
Minimal facet arthropathy and ligamentum flavum redundancy without
canal stenosis. Mild RIGHT greater than LEFT neural foraminal
narrowing.

L4-5: Anterolisthesis. Moderate broad-based disc bulge. Moderate to
severe facet arthropathy with trace reactive effusions. Moderate to
severe canal stenosis. Moderate to severe RIGHT, moderate LEFT
neural foraminal narrowing. Peripherally enhancing bilateral facet
synovial cysts within bilateral paraspinal soft tissues measuring to
14 mm.

L5-S1: Status post LEFT laminectomy. Nonenhancing 7 x 11 x 12 mm
cyst within laminectomy surgical bed. Retrolisthesis. Moderate RIGHT
central disc protrusion with enhancing annular fissure/discectomy
defect. No canal stenosis though, granulation tissue and narrowing
the lateral recesses may affect the traversing S1 nerves. Minimal
facet arthropathy. No canal stenosis.
IMPRESSION: 1. Status post LEFT L5 hemilaminectomy. Grade 1 L4-5 anterolisthesis
and grade 1 L5-S1 retrolisthesis. No acute osseous process.
2. Moderate to severe canal stenosis L4-5. L5-S1 lateral recess
stenosis and granulation tissue may affect the traversing S1 nerves.
3. Neural foraminal narrowing L3-4 through L5-S1: Moderate to severe
on the RIGHT at L4-5.

## 2019-02-04 ENCOUNTER — Other Ambulatory Visit: Payer: Self-pay

## 2019-02-04 ENCOUNTER — Ambulatory Visit (INDEPENDENT_AMBULATORY_CARE_PROVIDER_SITE_OTHER): Payer: Medicare Other | Admitting: Family Medicine

## 2019-02-04 ENCOUNTER — Encounter: Payer: Self-pay | Admitting: Family Medicine

## 2019-02-04 VITALS — BP 156/79 | HR 71 | Temp 97.2°F | Ht 72.5 in | Wt 215.0 lb

## 2019-02-04 DIAGNOSIS — M7989 Other specified soft tissue disorders: Secondary | ICD-10-CM | POA: Diagnosis not present

## 2019-02-04 DIAGNOSIS — M79662 Pain in left lower leg: Secondary | ICD-10-CM | POA: Diagnosis not present

## 2019-02-04 NOTE — Progress Notes (Signed)
Virtual Visit via Video Note  I connected with Adrian Cox on 02/04/19 at  2:30 PM EDT by a video enabled telemedicine application and verified that I am speaking with the correct person using two identifiers.  Patient was in his home and I was in my office.   I discussed the limitations of evaluation and management by telemedicine and the availability of in person appointments. The patient expressed understanding and agreed to proceed.  History of Present Illness: This is a 58 yo male who requests virtual visit today to discuss swelling left legs for 2 weeks.  He cannot recall any trauma or overuse.  Pain swelling in his left calf below his knee radiating above his knee to his hip his wife has noticed swelling.  He has not been able to palpate any knots.  He has not noted any skin discoloration.  He feels that the back of his calf is somewhat warm.  He is currently on oxycodone for chronic back pain and has been taking 1 a day with a little relief of his pain.  The pain is constant and achy.  Swelling may go down a little bit at night.  He does not have any history of DVT or PE.  He denies any chest pain, shortness of breath or fever. The triage nurse tried to get him to come in for an office visit today that he did not want to come in due to concerns for coronavirus.  Past Medical History:  Diagnosis Date  . Depression    bipolar  . Diverticulosis   . Hyperlipidemia   . Hypertension   . Mold exposure    Past Surgical History:  Procedure Laterality Date  . BACK SURGERY    . CERVICAL SPINE SURGERY  2003   fusion  . KNEE ARTHROSCOPY Right   . KNEE ARTHROSCOPY Left   . SPINE SURGERY     Family History  Problem Relation Age of Onset  . Cancer Mother   . Colon cancer Mother 66   Social History   Tobacco Use  . Smoking status: Never Smoker  . Smokeless tobacco: Never Used  Substance Use Topics  . Alcohol use: No  . Drug use: Yes    Types: Marijuana    Comment: Hx marijuana  use - none x 2 yrs      Observations/Objective: The patient is alert and answers questions appropriately.  He is walking about his home.  Head normocephalic.  Visible skin is unremarkable.  He is normally conversive and does not appear short of breath.  He attempted to show me his leg through his camera but due to positioning and lighting it was difficult to get a good view.  Assessment and Plan: 1. Pain of left calf -No known injury, to be on the safe side we will get a Doppler ultrasound to rule out a DVT since he is having unilateral pain and swelling. -He was instructed to follow-up if he developed shortness of breath, increased pain, fever. - US Venous Img Lower Unilateral Left; Future  2. Calf swelling - US Venous Img Lower Unilateral Left; Future   Olean Ree, FNP-BC  [facility], [facility]  02/04/2019 3:10 PM   Follow Up Instructions:    I discussed the assessment and treatment plan with the patient. The patient was provided an opportunity to ask questions and all were answered. The patient agreed with the plan and demonstrated an understanding of the instructions.  The patient was advised to call back or seek an in-person evaluation if the symptoms worsen or if the condition fails to improve as anticipated.  Elby Beck, FNP

## 2019-02-05 ENCOUNTER — Ambulatory Visit
Admission: RE | Admit: 2019-02-05 | Discharge: 2019-02-05 | Disposition: A | Discharge status: Home / Self Care | Payer: Medicare Other | Source: Ambulatory Visit | Attending: Family Medicine | Admitting: Family Medicine

## 2019-02-05 ENCOUNTER — Telehealth: Payer: Self-pay

## 2019-02-05 DIAGNOSIS — M79662 Pain in left lower leg: Secondary | ICD-10-CM | POA: Diagnosis not present

## 2019-02-05 DIAGNOSIS — M7989 Other specified soft tissue disorders: Secondary | ICD-10-CM | POA: Diagnosis not present

## 2019-02-05 DIAGNOSIS — R2242 Localized swelling, mass and lump, left lower limb: Secondary | ICD-10-CM | POA: Diagnosis not present

## 2019-02-05 NOTE — Telephone Encounter (Signed)
Stephanie with [facility] called report for US Venous lower lt leg; report is in Epic; impression; negative for DVT in lt lower extremity. Pt is not waiting. Harlin Heys FNP out of office. Will send to Harlin Heys FNP and Dr Ermalene Searing who is in office today.

## 2019-02-05 NOTE — Telephone Encounter (Signed)
Called patient to review negative doppler ultrasound. Encouraged patient to schedule in office visit for evaluation. He stated he wanted to give it a couple more days and would follow up if not improved. I instructed him to let us know if worsening swelling, pain or change in color or temperature of leg. He verbalized understanding.  FYI to PCP.

## 2019-02-06 ENCOUNTER — Ambulatory Visit (INDEPENDENT_AMBULATORY_CARE_PROVIDER_SITE_OTHER): Payer: Medicare Other | Admitting: Psychology

## 2019-02-06 DIAGNOSIS — F411 Generalized anxiety disorder: Secondary | ICD-10-CM

## 2019-02-20 ENCOUNTER — Other Ambulatory Visit: Payer: Self-pay | Admitting: Family Medicine

## 2019-02-21 NOTE — Telephone Encounter (Signed)
Last office visit 02/04/2019 with D. Leone Payor for left calf pain.  Last refilled 12/07/2018 for #60 with 1 refill.  CPE scheduled for 12/08/2019.

## 2019-02-27 ENCOUNTER — Telehealth: Payer: Self-pay

## 2019-02-27 NOTE — Telephone Encounter (Signed)
Last office visit 02/04/2019 with D. Leone Payor for Pain in left calf.  Last refilled Oxycodone 11/20/2018 for #30 with no refills.  Next Appt: 11/29/2019 for CPE.  UDS/Contract 11/20/2018.

## 2019-02-27 NOTE — Telephone Encounter (Signed)
Call.. needs 3 month pain management OV virtually prior to refills.

## 2019-02-28 ENCOUNTER — Encounter: Payer: Self-pay | Admitting: Family Medicine

## 2019-02-28 ENCOUNTER — Ambulatory Visit (INDEPENDENT_AMBULATORY_CARE_PROVIDER_SITE_OTHER): Payer: Medicare Other | Admitting: Family Medicine

## 2019-02-28 VITALS — BP 167/100 | HR 75 | Temp 97.9°F | Ht 72.5 in | Wt 215.0 lb

## 2019-02-28 DIAGNOSIS — M79605 Pain in left leg: Secondary | ICD-10-CM

## 2019-02-28 DIAGNOSIS — G8929 Other chronic pain: Secondary | ICD-10-CM

## 2019-02-28 DIAGNOSIS — M79651 Pain in right thigh: Secondary | ICD-10-CM | POA: Diagnosis not present

## 2019-02-28 DIAGNOSIS — I10 Essential (primary) hypertension: Secondary | ICD-10-CM

## 2019-02-28 DIAGNOSIS — G894 Chronic pain syndrome: Secondary | ICD-10-CM

## 2019-02-28 DIAGNOSIS — M5440 Lumbago with sciatica, unspecified side: Secondary | ICD-10-CM

## 2019-02-28 MED ORDER — OXYCODONE HCL 5 MG PO CAPS: 5.0000 mg | ORAL_CAPSULE | Freq: Every day | ORAL | 0 refills | Status: DC

## 2019-02-28 NOTE — Assessment & Plan Note (Signed)
Previously well controlled. Pt assymptomatic. Will follow at home over next 2 weeks.. he will call or email measurements.

## 2019-02-28 NOTE — Assessment & Plan Note (Signed)
Indication for chronic opioid:  Chronic back pain and right thigh pain form dog bite. Also some chronic right shouder pain Medication and dose: oxycodone 5 mg daily.. in last 2 weeks using daily # pills per month: usually < 30 per month, last 30 lasted 3 months Last UDS date: 11/20/2018 Opioid Treatment Agreement signed (Y/N):   Today.. renew Opioid Treatment Agreement last reviewed with patient:   Y 11/20/2018 NCCSRS reviewed this encounter (include red flags):   no red flags reviewed 02/28/19 Also on lyrica 150 mg  BID.

## 2019-02-28 NOTE — Telephone Encounter (Signed)
Today 5/15 @ 3:40

## 2019-02-28 NOTE — Patient Instructions (Addendum)
Follow  blood pressure at home.. Call or email in mychart with measurments in 2 weeks. Call if your leg and low back pain not improving with heat, home stretching and lyrica twice daily.

## 2019-02-28 NOTE — Telephone Encounter (Signed)
Please schedule virtual appointment with Dr. Ermalene Searing for pain management.

## 2019-02-28 NOTE — Assessment & Plan Note (Signed)
May be due to acute worsening or chronic low back pain.  No falls, no red flags.  Pt will use heat, home PT and take lyrica twice daily.Marland Kitchen if not better in 2 weeks will call for likely in office follow up.

## 2019-02-28 NOTE — Addendum Note (Signed)
Addended by: Damita Lack on: 02/28/2019 08:22 AM   Modules accepted: Orders

## 2019-02-28 NOTE — Progress Notes (Signed)
VIRTUAL VISIT Due to national recommendations of social distancing due to COVID 19, a virtual visit is felt to be most appropriate for this patient at this time.   I connected with the patient on 02/28/19 at  3:40 PM EDT by virtual telehealth platform and verified that I am speaking with the correct person using two identifiers.   I discussed the limitations, risks, security and privacy concerns of performing an evaluation and management service by  virtual telehealth platform and the availability of in person appointments. I also discussed with the patient that there may be a patient responsible charge related to this service. The patient expressed understanding and agreed to proceed.  Patient location: Home Provider Location: [facility] Participants: Kerby Nora and Adair Patter   Chief Complaint  Patient presents with  . Pain Management    History of Present Illness: 58 year old male with chronic back pain and right thigh pain from past dog bite resulting in nerve pain in thigh presents for 3 month chronic pain management OV.   Indication for chronic opioid:  Chronic back pain and right thigh pain form dog bite. Also some chronic right shouder pain Medication and dose: oxycodone 5 mg daily.. in last 2 weeks using daily # pills per month: usually < 30 per month, last 30 lasted 3 months Last UDS date: 11/20/2018 Opioid Treatment Agreement signed (Y/N):   Today.. renew Opioid Treatment Agreement last reviewed with patient:   Y 11/20/2018 NCCSRS reviewed this encounter (include red flags):   no red flags reviewed 02/28/19 Also on lyrica 150 mg  BID.  Hypertension:  BP per pt report very elevated on no medication today.  No Chest pain, no SOB, no edema  Still some pain in left calf.. from buttocks to lower leg. lYruica helps with the pain.  neg DVT last week BP Readings from Last 3 Encounters:  02/28/19 (!) 167/100  02/04/19 (!) 156/79  11/20/18 120/60     Using  medication without problems or lightheadedness:  Chest pain with exertion: Edema: Short of breath: Average home BPs: Other issues:  COVID 19 screen No recent travel or known exposure to COVID19 The patient denies respiratory symptoms of COVID 19 at this time.  The importance of social distancing was discussed today.   Review of Systems  Constitutional: Negative for chills and fever.  HENT: Negative for congestion and ear pain.   Eyes: Negative for pain and redness.  Respiratory: Negative for cough and shortness of breath.   Cardiovascular: Negative for chest pain, palpitations and leg swelling.  Gastrointestinal: Negative for abdominal pain, blood in stool, constipation, diarrhea, nausea and vomiting.  Genitourinary: Negative for dysuria.  Musculoskeletal: Negative for falls and myalgias.  Skin: Negative for rash.  Neurological: Negative for dizziness.  Psychiatric/Behavioral: Negative for depression. The patient is not nervous/anxious.       Past Medical History:  Diagnosis Date  . Depression    bipolar  . Diverticulosis   . Hyperlipidemia   . Hypertension   . Mold exposure     reports that he has never smoked. He has never used smokeless tobacco. He reports current drug use. Drug: Marijuana. He reports that he does not drink alcohol.   Current Outpatient Medications:  .  cholecalciferol (VITAMIN D) 1000 units tablet, Take 1,000 Units by mouth daily., Disp: , Rfl:  .  FIBER PO, Take 4 tablets by mouth daily., Disp: , Rfl:  .  Multiple Vitamin (MULTIVITAMIN WITH MINERALS) TABS tablet, Take  1 tablet by mouth daily., Disp: , Rfl:  .  oxycodone (OXY-IR) 5 MG capsule, Take 1 capsule (5 mg total) by mouth daily., Disp: 30 capsule, Rfl: 0 .  pregabalin (LYRICA) 150 MG capsule, TAKE 1 CAPSULE BY MOUTH TWICE A DAY, Disp: 60 capsule, Rfl: 0   Observations/Objective: Blood pressure (!) 167/100, pulse 75, temperature 97.9 F (36.6 C), temperature source Oral, height 6' 0.5" (1.842  m), weight 215 lb (97.5 kg).  Physical Exam  Physical Exam Constitutional:      General: The patient is not in acute distress. Pulmonary:     Effort: Pulmonary effort is normal. No respiratory distress.  Neurological:     Mental Status: The patient is alert and oriented to person, place, and time.  Psychiatric:        Mood and Affect: Mood normal.        Behavior: Behavior normal.   Essential hypertension Previously well controlled. Pt assymptomatic. Will follow at home over next 2 weeks.. he will call or email measurements.  Left leg pain May be due to acute worsening or chronic low back pain.  No falls, no red flags.  Pt will use heat, home PT and take lyrica twice daily.Marland Kitchen. if not better in 2 weeks will call for likely in office follow up.  Chronic pain syndrome Indication for chronic opioid:  Chronic back pain and right thigh pain form dog bite. Also some chronic right shouder pain Medication and dose: oxycodone 5 mg daily.. in last 2 weeks using daily # pills per month: usually < 30 per month, last 30 lasted 3 months Last UDS date: 11/20/2018 Opioid Treatment Agreement signed (Y/N):   Today.. renew Opioid Treatment Agreement last reviewed with patient:   Y 11/20/2018 NCCSRS reviewed this encounter (include red flags):   no red flags reviewed 02/28/19 Also on lyrica 150 mg  BID.   Assessment and Plan    I discussed the assessment and treatment plan with the patient. The patient was provided an opportunity to ask questions and all were answered. The patient agreed with the plan and demonstrated an understanding of the instructions.   The patient was advised to call back or seek an in-person evaluation if the symptoms worsen or if the condition fails to improve as anticipated.     Kerby NoraAmy , MD

## 2019-03-01 NOTE — Progress Notes (Signed)
Appointment  8/15

## 2019-03-15 ENCOUNTER — Encounter: Payer: Self-pay | Admitting: Family Medicine

## 2019-03-15 ENCOUNTER — Ambulatory Visit (INDEPENDENT_AMBULATORY_CARE_PROVIDER_SITE_OTHER): Payer: Medicare Other | Admitting: Family Medicine

## 2019-03-15 VITALS — BP 186/106 | Temp 98.7°F | Ht 72.5 in | Wt 213.0 lb

## 2019-03-15 DIAGNOSIS — I10 Essential (primary) hypertension: Secondary | ICD-10-CM | POA: Diagnosis not present

## 2019-03-15 MED ORDER — LISINOPRIL 10 MG PO TABS: 10.0000 mg | ORAL_TABLET | Freq: Every day | ORAL | 3 refills | Status: DC

## 2019-03-15 NOTE — Assessment & Plan Note (Signed)
Restart lisinopril at 10 mg daily.  Follow at home, may need to increase pr add back amlodipine.  Eval with BMET in 2 weeks at follow up OV.

## 2019-03-15 NOTE — Progress Notes (Signed)
VIRTUAL VISIT Due to national recommendations of social distancing due to COVID 19, a virtual visit is felt to be most appropriate for this patient at this time.   I connected with the patient on 03/15/19 at  3:20 PM EDT by virtual telehealth platform and verified that I am speaking with the correct person using two identifiers.   I discussed the limitations, risks, security and privacy concerns of performing an evaluation and management service by  virtual telehealth platform and the availability of in person appointments. I also discussed with the patient that there may be a patient responsible charge related to this service. The patient expressed understanding and agreed to proceed.  Patient location: Home Provider [facility] Participants: Kerby NoraAmy Bedsole and Adair PatterAnthony G Ramsburg   Chief Complaint  Patient presents with  . Follow-up    HTN & Pain    History of Present Illness: 58 year old male presents for 2 week follow up HTN.  Last pain visit  On 02/28/2019 At that time BP was  elevated .. it has continued to remain high on pt measurements at home. BP Readings from Last 3 Encounters:  03/15/19 (!) 186/106  02/28/19 (!) 167/100  02/04/19 (!) 156/79   In past he was on lisinopril and amlodipine.. but had stopped given improvement off medication No associated headache, chest pain or blurred vision. He is sweating a lot.  NoSOB, no edema   Continues to have low back pain.Marland Kitchen. Has seen Dr. Alvester MorinNewton and Dr. Franky Machoabbell I  past COVID 19 screen No recent travel or known exposure to COVID19 The patient denies respiratory symptoms of COVID 19 at this time.  The importance of social distancing was discussed today.   Review of Systems  Constitutional: Negative for chills and fever.  HENT: Negative for congestion and ear pain.   Eyes: Negative for pain and redness.  Respiratory: Negative for cough and shortness of breath.   Cardiovascular: Negative for chest pain, palpitations and  leg swelling.  Gastrointestinal: Negative for abdominal pain, blood in stool, constipation, diarrhea, nausea and vomiting.  Genitourinary: Negative for dysuria.  Musculoskeletal: Negative for falls and myalgias.  Skin: Negative for rash.  Neurological: Negative for dizziness.  Psychiatric/Behavioral: Negative for depression. The patient is not nervous/anxious.       Past Medical History:  Diagnosis Date  . Depression    bipolar  . Diverticulosis   . Hyperlipidemia   . Hypertension   . Mold exposure     reports that he has never smoked. He has never used smokeless tobacco. He reports current drug use. Drug: Marijuana. He reports that he does not drink alcohol.   Current Outpatient Medications:  .  cholecalciferol (VITAMIN D) 1000 units tablet, Take 1,000 Units by mouth daily., Disp: , Rfl:  .  FIBER PO, Take 4 tablets by mouth daily., Disp: , Rfl:  .  Multiple Vitamin (MULTIVITAMIN WITH MINERALS) TABS tablet, Take 1 tablet by mouth daily., Disp: , Rfl:  .  oxycodone (OXY-IR) 5 MG capsule, Take 1 capsule (5 mg total) by mouth daily., Disp: 30 capsule, Rfl: 0 .  pregabalin (LYRICA) 150 MG capsule, TAKE 1 CAPSULE BY MOUTH TWICE A DAY, Disp: 60 capsule, Rfl: 0   Observations/Objective: Blood pressure (!) 186/106, temperature 98.7 F (37.1 C), temperature source Oral, height 6' 0.5" (1.842 m), weight 213 lb (96.6 kg).  Physical Exam  Physical Exam Constitutional:      General: The patient is not in acute distress. Pulmonary:  Effort: Pulmonary effort is normal. No respiratory distress.  Neurological:     Mental Status: The patient is alert and oriented to person, place, and time.  Psychiatric:        Mood and Affect: Mood normal.        Behavior: Behavior normal.   Assessment and Plan Essential hypertension Restart lisinopril at 10 mg daily.  Follow at home, may need to increase pr add back amlodipine.  Eval with BMET in 2 weeks at follow up OV.     I discussed the  assessment and treatment plan with the patient. The patient was provided an opportunity to ask questions and all were answered. The patient agreed with the plan and demonstrated an understanding of the instructions.   The patient was advised to call back or seek an in-person evaluation if the symptoms worsen or if the condition fails to improve as anticipated.     Kerby Nora, MD

## 2019-03-15 NOTE — Patient Instructions (Signed)
Start lisinopril 10 mg daily.  Follow BP at home .. goal < 140/90.  Go to ER if shortness of breht or chest pain.

## 2019-03-18 NOTE — Progress Notes (Signed)
appointment 6/16 pt aware

## 2019-03-28 ENCOUNTER — Ambulatory Visit (INDEPENDENT_AMBULATORY_CARE_PROVIDER_SITE_OTHER): Payer: Medicare Other | Admitting: Family Medicine

## 2019-03-28 ENCOUNTER — Telehealth: Payer: Self-pay

## 2019-03-28 ENCOUNTER — Encounter: Payer: Self-pay | Admitting: Family Medicine

## 2019-03-28 DIAGNOSIS — Z7189 Other specified counseling: Secondary | ICD-10-CM | POA: Insufficient documentation

## 2019-03-28 NOTE — Telephone Encounter (Signed)
Most liekly he does not need to isolate or be tested as long as assymptmoatic. To review case with him in detail have him make a virtual OV

## 2019-03-28 NOTE — Progress Notes (Signed)
VIRTUAL VISIT Due to national recommendations of social distancing due to [place] 19, a virtual visit is felt to be most appropriate for this patient at this time.   I connected with the patient on 03/28/19 at  4:20 PM EDT by virtual telehealth platform and verified that I am speaking with the correct person using two identifiers.   I discussed the limitations, risks, security and privacy concerns of performing an evaluation and management service by  virtual telehealth platform and the availability of in person appointments. I also discussed with the patient that there may be a patient responsible charge related to this service. The patient expressed understanding and agreed to proceed.  Patient location: Home Provider Location: [facility] Participants: Eliezer Lofts and Shan Levans   Chief Complaint  Patient presents with  . Discuss Covid Concerns    History of Present Illness: 58 year old male presents with concerns of covid19 exposure. He has been around his father in law was tested for exposure at workplace.  No  known symptoms in father in law when patient saw him 4 days ago.  No ST, no fever, no congestion.. dry throat and cough  Off and on. He does have some allergies.  no diarreha, no emesis.  no change in taste or smell COVID 19 screen No recent travel or known exposure to COVID19 The patient denies respiratory symptoms of COVID 19 at this time.  The importance of social distancing was discussed today.   Review of Systems  Constitutional: Negative for chills and fever.  HENT: Negative for congestion and ear pain.   Eyes: Negative for pain and redness.  Respiratory: Negative for cough and shortness of breath.   Cardiovascular: Negative for chest pain, palpitations and leg swelling.  Gastrointestinal: Negative for abdominal pain, blood in stool, constipation, diarrhea, nausea and vomiting.  Genitourinary: Negative for dysuria.  Musculoskeletal: Negative for  falls and myalgias.  Skin: Negative for rash.  Neurological: Negative for dizziness.  Psychiatric/Behavioral: Negative for depression. The patient is not nervous/anxious.       Past Medical History:  Diagnosis Date  . Depression    bipolar  . Diverticulosis   . Hyperlipidemia   . Hypertension   . Mold exposure     reports that he has never smoked. He has never used smokeless tobacco. He reports current drug use. Drug: Marijuana. He reports that he does not drink alcohol.   Current Outpatient Medications:  .  cholecalciferol (VITAMIN D) 1000 units tablet, Take 1,000 Units by mouth daily., Disp: , Rfl:  .  FIBER PO, Take 4 tablets by mouth daily., Disp: , Rfl:  .  lisinopril (ZESTRIL) 10 MG tablet, Take 1 tablet (10 mg total) by mouth daily., Disp: 90 tablet, Rfl: 3 .  Multiple Vitamin (MULTIVITAMIN WITH MINERALS) TABS tablet, Take 1 tablet by mouth daily., Disp: , Rfl:  .  oxycodone (OXY-IR) 5 MG capsule, Take 1 capsule (5 mg total) by mouth daily., Disp: 30 capsule, Rfl: 0 .  pregabalin (LYRICA) 150 MG capsule, TAKE 1 CAPSULE BY MOUTH TWICE A DAY, Disp: 60 capsule, Rfl: 0   Observations/Objective: Blood pressure (!) 144/109, pulse 90, temperature 98.2 F (36.8 C), temperature source Oral, height 6' 0.5" (1.842 m), weight 215 lb (97.5 kg).  Physical Exam  Physical Exam Constitutional:      General: The patient is not in acute distress. Pulmonary:     Effort: Pulmonary effort is normal. No respiratory distress.  Neurological:  Mental Status: The patient is alert and oriented to person, place, and time.  Psychiatric:        Mood and Affect: Mood normal.        Behavior: Behavior normal.   Assessment and Plan   Advice Given About Covid-19 Virus Infection Indirect possible exposure. No symptoms.  Await test of relative.  Check daily temps.  If symptoms begin... consider testing for patient.  For now continue social distancing, but no clear reason for full full home  isolation. Pt does not work.   I discussed the assessment and treatment plan with the patient. The patient was provided an opportunity to ask questions and all were answered. The patient agreed with the plan and demonstrated an understanding of the instructions.   The patient was advised to call back or seek an in-person evaluation if the symptoms worsen or if the condition fails to improve as anticipated.     Kerby NoraAmy Bedsole, MD

## 2019-03-28 NOTE — Telephone Encounter (Signed)
Pt went to his FIL house on 03-22-19. FIL was sent home from work yesterday with possible Covid and was tested due to 3-5 employees testing positive. FIL is asymptomatic. Pt is asking if he needs to be tested and quantine. Please call pt at [phone].

## 2019-03-28 NOTE — Telephone Encounter (Signed)
Mr. Hoehn notified as instructed by telephone.  Virtural appointment scheduled today at 4:20 pm with Dr. Diona Browner.

## 2019-03-28 NOTE — Assessment & Plan Note (Signed)
Indirect possible exposure. No symptoms.  Await test of relative.  Check daily temps.  If symptoms begin... consider testing for patient.  For now continue social distancing, but no clear reason for full full home isolation. Pt does not work.

## 2019-03-28 NOTE — Patient Instructions (Signed)
Call if symptoms begin. If they do treat as below. Isolate until father in laws test returns.  How to care for yourself at home:   1) Drink plenty of fluids 2) Get lots of rest 3) Wash your hands regularly - for 20 seconds 4) Cover your mouth when you cough -- ideally cough into your elbow 5) Take tylenol - can do up to 1000 mg every 8 hours (or do smaller doses more frequently) - Avoid Ibuprofen if able 6) Stay home - avoid contact with other people. Ideally have someone bring your groceries, etc.  7) Avoid touching your eyes, nose, mouth 8) Over the counter cold medication may be helpful  You leave home again - when ALL the following are TRUE:  1) No fever for at least 72 hours (without taking any medication) 2) Other symptoms have improved - no longer with cough or shortness of breath 3) At least 7 days since you got sick  Call the clinic immediately or consider going to the emergency room if:  1) Trouble Breathing 2) Persistent pain or pressure in the chest 3) New confusion or inability to wake 4) Bluish lips or face 5) Notify them that you may have COVID-19  For close contacts (people in your home)  1) Help the person you are with stay home - grocery, pharmacy, etc 2) Help monitor their symptoms for worsening illness 3) Ideally sleep in a separate room and try to use separate bathroom if possible 4) Try to limit care giver to ONE person - all others (including pets) should avoid contact 5) Clean surfaces often and wash laundry often 6) Monitor yourself for symptoms. Make sure you contact your health care provider and avoid work as soon as you develop symptoms 7) Ideally would recommend working from home or not going to work if able.    Medications:  You may have heard about medications currently being used to treat COVID-19 - including Remdesivir and Hydroxychloroquine/Chloroquine. Currently there are no FDA approved medications to treat COVID-19 and these medicines are only  being used as part of a clinical trials (still studying the effect). They are only being used in hospitalized patients because they come with significant risks.   The only proven treatment is symptomatic care - rest, fluids, tylenol.    Below is more detailed information from the [facility]  Individuals who are confirmed to have, or are being evaluated for, COVID-19 should follow the prevention steps below until a healthcare provider or local or state health department says they can return to normal activities.  Stay home except to get medical care You should restrict activities outside your home, except for getting medical care. Do not go to work, school, or public areas, and do not use public transportation or taxis.  Call ahead before visiting your doctor Before your medical appointment, call the healthcare provider and tell them that you are being evaluated for, COVID-19 infection.  Monitor your symptoms Seek prompt medical attention if your illness is worsening (e.g., difficulty breathing).   Wear a facemask You should wear a facemask that covers your nose and mouth when you are in the same room with other people and when you visit a healthcare provider.   Separate yourself from other people in your home As much as possible, you should stay in a different room from other people in your home. Also, you should use a separate bathroom, if available.  Avoid sharing household items You should not share dishes, drinking  glasses, cups, eating utensils, towels, bedding, or other items with other people in your home. After using these items, you should wash them thoroughly with soap and water.  Cover your coughs and sneezes Cover your mouth and nose with a tissue when you cough or sneeze, or you can cough or sneeze into your sleeve. Throw used tissues in a lined trash can, and immediately wash your hands with soap and water for at least 20 seconds or use an alcohol-based  hand rub.  Wash your [facility] your hands often and thoroughly with soap and water for at least 20 seconds. You can use an alcohol-based hand sanitizer if soap and water are not available and if your hands are not visibly dirty. Avoid touching your eyes, nose, and mouth with unwashed hands.   Prevention Steps for Caregivers and Household Members of Individuals Confirmed to have, or Being Evaluated for, COVID-19 Infection Being Cared for in the Home  If you live with, or provide care at home for, a person confirmed to have, or being evaluated for, COVID-19 infection please follow these guidelines to prevent infection:  Follow healthcare provider's instructions Make sure that you understand and can help the patient follow any healthcare provider instructions for all care.  Provide for the patient's basic needs You should help the patient with basic needs in the home and provide support for getting groceries, prescriptions, and other personal needs.  Monitor the patient's symptoms If they are getting sicker, call his or her medical provider.   Limit the number of people who have contact with the patient  If possible, have only one caregiver for the patient.  Other household members should stay in another home or place of residence. If this is not possible, they should stay  in another room, or be separated from the patient as much as possible. Use a separate bathroom, if available.  Restrict visitors who do not have an essential need to be in the home.  Keep older adults, very young children, and other sick people away from the patient Keep older adults, very young children, and those who have compromised immune systems or chronic health conditions away from the patient. This includes people with chronic heart, lung, or kidney conditions, diabetes, and cancer.  Ensure good ventilation Make sure that shared spaces in the home have good air flow, such as from an air conditioner or  an opened window, weather permitting.  Wash your hands often  Wash your hands often and thoroughly with soap and water for at least 20 seconds. You can use an alcohol based hand sanitizer if soap and water are not available and if your hands are not visibly dirty.  Avoid touching your eyes, nose, and mouth with unwashed hands.  Use disposable paper towels to dry your hands. If not available, use dedicated cloth towels and replace them when they become wet.  Wear a facemask and gloves  Wear a disposable facemask at all times in the room and gloves when you touch or have contact with the patient's blood, body fluids, and/or secretions or excretions, such as sweat, saliva, sputum, nasal mucus, vomit, urine, or feces.  Ensure the mask fits over your nose and mouth tightly, and do not touch it during use.  Throw out disposable facemasks and gloves after using them. Do not reuse.  Wash your hands immediately after removing your facemask and gloves.  If your personal clothing becomes contaminated, carefully remove clothing and launder. Wash your hands after handling contaminated  clothing.  Place all used disposable facemasks, gloves, and other waste in a lined container before disposing them with other household waste.  Remove gloves and wash your hands immediately after handling these items.  Do not share dishes, glasses, or other household items with the patient  Avoid sharing household items. You should not share dishes, drinking glasses, cups, eating utensils, towels, bedding, or other items with a patient who is confirmed to have, or being evaluated for, COVID-19 infection.  After the person uses these items, you should wash them thoroughly with soap and water.  Wash laundry thoroughly  Immediately remove and wash clothes or bedding that have blood, body fluids, and/or secretions or excretions, such as sweat, saliva, sputum, nasal mucus, vomit, urine, or feces, on them.  Wear gloves  when handling laundry from the patient.  Read and follow directions on labels of laundry or clothing items and detergent. In general, wash and dry with the warmest temperatures recommended on the label.  Clean all areas the individual has used often  Clean all touchable surfaces, such as counters, tabletops, doorknobs, bathroom fixtures, toilets, phones, keyboards, tablets, and bedside tables, every day. Also, clean any surfaces that may have blood, body fluids, and/or secretions or excretions on them.  Wear gloves when cleaning surfaces the patient has come in contact with.  Use a diluted bleach solution (e.g., dilute bleach with 1 part bleach and 10 parts water) or a household disinfectant with a label that says EPA-registered for coronaviruses. To make a bleach solution at home, add 1 tablespoon of bleach to 1 quart (4 cups) of water. For a larger supply, add  cup of bleach to 1 gallon (16 cups) of water.  Read labels of cleaning products and follow recommendations provided on product labels. Labels contain instructions for safe and effective use of the cleaning product including precautions you should take when applying the product, such as wearing gloves or eye protection and making sure you have good ventilation during use of the product.  Remove gloves and wash hands immediately after cleaning.  Monitor yourself for signs and symptoms of illness Caregivers and household members are considered close contacts, should monitor their health, and will be asked to limit movement outside of the home to the extent possible. Follow the monitoring steps for close contacts listed on the symptom monitoring form.

## 2019-04-02 ENCOUNTER — Ambulatory Visit: Payer: Medicare Other | Admitting: Family Medicine

## 2019-04-18 ENCOUNTER — Other Ambulatory Visit: Payer: Self-pay | Admitting: Family Medicine

## 2019-04-18 NOTE — Telephone Encounter (Signed)
Last office visit 03/28/2019 for Covid Exposure.  Last refilled 02/21/2019 for #60 with no refills.  Next Appt: 06/06/2019 for pain management.

## 2019-05-03 ENCOUNTER — Encounter: Payer: Self-pay | Admitting: Family Medicine

## 2019-05-03 MED ORDER — PREGABALIN 150 MG PO CAPS: 150.0000 mg | ORAL_CAPSULE | Freq: Two times a day (BID) | ORAL | 2 refills | Status: DC

## 2019-05-14 ENCOUNTER — Encounter: Payer: Self-pay | Admitting: Family Medicine

## 2019-05-14 ENCOUNTER — Other Ambulatory Visit: Payer: Self-pay

## 2019-05-14 ENCOUNTER — Ambulatory Visit (INDEPENDENT_AMBULATORY_CARE_PROVIDER_SITE_OTHER): Payer: Medicare Other | Admitting: Family Medicine

## 2019-05-14 VITALS — BP 130/80 | HR 73 | Temp 97.1°F | Ht 72.5 in | Wt 221.5 lb

## 2019-05-14 DIAGNOSIS — I10 Essential (primary) hypertension: Secondary | ICD-10-CM

## 2019-05-14 DIAGNOSIS — M25511 Pain in right shoulder: Secondary | ICD-10-CM

## 2019-05-14 DIAGNOSIS — M5440 Lumbago with sciatica, unspecified side: Secondary | ICD-10-CM

## 2019-05-14 DIAGNOSIS — G8929 Other chronic pain: Secondary | ICD-10-CM

## 2019-05-14 DIAGNOSIS — G894 Chronic pain syndrome: Secondary | ICD-10-CM | POA: Diagnosis not present

## 2019-05-14 DIAGNOSIS — M5412 Radiculopathy, cervical region: Secondary | ICD-10-CM

## 2019-05-14 MED ORDER — OXYCODONE HCL 5 MG PO CAPS: 5.0000 mg | ORAL_CAPSULE | Freq: Every day | ORAL | 0 refills | Status: DC

## 2019-05-14 MED ORDER — DICLOFENAC SODIUM 75 MG PO TBEC: 75.0000 mg | DELAYED_RELEASE_TABLET | Freq: Two times a day (BID) | ORAL | 0 refills | Status: DC

## 2019-05-14 NOTE — Assessment & Plan Note (Signed)
Will try to add diclofenac as this seemed to help per notes in 2018. Refer back to Dr. Ernestina Patches for likely epidural steroid injection.

## 2019-05-14 NOTE — Progress Notes (Signed)
Chief Complaint  Patient presents with  . Back Pain  . Shoulder Pain    History of Present Illness: HPI   58 year old male with history of chronic low back pain and chronic right shoulder pain presents with continued pain in right shoulder and left leg.    2018 Saw Dr. Berline Choughigby for underlying scapular dyskinesis in the setting of cervical radiculitis. Treated with gabapentin at that time.  He is now on lyrica 150 mg BID for chronic pain.    Was previously using oxycodone 5 mg daily prn.  Refill needed at this point. Indication for chronic opioid:  Chronic back pain and right thigh pain form dog bite. Medication and dose: oxycodone 5 mg daily # pills per month: usually < 30 per month, last 30 lasted 3 months Last UDS date: 11/21/2018 Opioid Treatment Agreement signed (Y/N):   Y 02/18/2019 Opioid Treatment Agreement last reviewed with patient:   Y  NCCSRS reviewed this encounter (include red flags):   no red flags except multiple pharmacies, reviewed 05/14/2019  Went to [facility] once given insurance was not covering.. plan to only go to [facility] from now on as using [facility].   No new falls, no new issues.  Pain  From right neck into right upper arm. No new numbness, no weakness.  7-8/10  On pain scale. [place] head hurts more.  He is doing neck and shoulder stretching, walking 1-2 miles daily  Has seen Dr. Alvester MorinNewton for  epidural in neck  02/2018.    HTN improved control on lisinopril   COVID 19 screen No recent travel or known exposure to COVID19 The patient denies respiratory symptoms of COVID 19 at this time.  The importance of social distancing was discussed today.   Review of Systems  Constitutional: Negative for chills and fever.  HENT: Negative for congestion and ear pain.   Eyes: Negative for pain and redness.  Respiratory: Negative for cough and shortness of breath.   Cardiovascular: Negative for chest pain, palpitations and leg swelling.  Gastrointestinal: Negative for  abdominal pain, blood in stool, constipation, diarrhea, nausea and vomiting.  Genitourinary: Negative for dysuria.  Musculoskeletal: Negative for falls and myalgias.  Skin: Negative for rash.  Neurological: Negative for dizziness.  Psychiatric/Behavioral: Negative for depression. The patient is not nervous/anxious.       Past Medical History:  Diagnosis Date  . Depression    bipolar  . Diverticulosis   . Hyperlipidemia   . Hypertension   . Mold exposure     reports that he has never smoked. He has never used smokeless tobacco. He reports current drug use. Drug: Marijuana. He reports that he does not drink alcohol.   Current Outpatient Medications:  .  cholecalciferol (VITAMIN D) 1000 units tablet, Take 1,000 Units by mouth daily., Disp: , Rfl:  .  lisinopril (ZESTRIL) 10 MG tablet, Take 1 tablet (10 mg total) by mouth daily., Disp: 90 tablet, Rfl: 3 .  pregabalin (LYRICA) 150 MG capsule, Take 1 capsule (150 mg total) by mouth 2 (two) times daily., Disp: 60 capsule, Rfl: 2   Observations/Objective: Blood pressure 130/80, pulse 73, temperature (!) 97.1 F (36.2 C), temperature source Temporal, height 6' 0.5" (1.842 m), weight 221 lb 8 oz (100.5 kg).  Physical Exam Constitutional:      Appearance: He is well-developed.  HENT:     Head: Normocephalic.     Right Ear: Hearing normal.     Left Ear: Hearing normal.  Nose: Nose normal.  Neck:     Thyroid: No thyroid mass or thyromegaly.     Vascular: No carotid bruit.     Trachea: Trachea normal.  Cardiovascular:     Rate and Rhythm: Normal rate and regular rhythm.     Pulses: Normal pulses.     Heart sounds: Heart sounds not distant. No murmur. No friction rub. No gallop.      Comments: No peripheral edema Pulmonary:     Effort: Pulmonary effort is normal. No respiratory distress.     Breath sounds: Normal breath sounds.  Musculoskeletal:     Right shoulder: He exhibits normal range of motion, no tenderness and no bony  tenderness.     Cervical back: He exhibits decreased range of motion and tenderness. He exhibits no bony tenderness.  Skin:    General: Skin is warm and dry.     Findings: No rash.  Neurological:     General: No focal deficit present.     Mental Status: He is alert and oriented to person, place, and time.     Cranial Nerves: Cranial nerves are intact.     Sensory: Sensation is intact.     Motor: Motor function is intact.     Coordination: Coordination is intact.  Psychiatric:        Speech: Speech normal.        Behavior: Behavior normal.        Thought Content: Thought content normal.      Assessment and Plan   Essential hypertension Well controlled. Continue current medication.   Chronic pain syndrome Was previously using oxycodone 5 mg daily prn.  Refill needed at this point. Indication for chronic opioid:Chronic back apoin and right thigh pain form dog bite. Medication and dose:oxycodone 5 mg daily # pills per month: usually < 30 per month, last 30 lasted 3 months Last UDS date: 11/21/2018 Opioid Treatment Agreement signed (Y/N):Y 02/18/2019 Opioid Treatment Agreement last reviewed with patient:Y  NCCSRS reviewed this encounter (include red flags):no red flags except multiple pharmacies, reviewed 05/14/2019  Went to [facility] once given insurance was not covering.. plan to only go to [facility] from now on as using [facility].  Cervical radiculopathy  Will try to add diclofenac as this seemed to help per notes in 2018. Refer back to Dr. Ernestina Patches for likely epidural steroid injection.       Eliezer Lofts, MD

## 2019-05-14 NOTE — Assessment & Plan Note (Signed)
Was previously using oxycodone 5 mg daily prn.  Refill needed at this point. Indication for chronic opioid:Chronic back apoin and right thigh pain form dog bite. Medication and dose:oxycodone 5 mg daily # pills per month: usually < 30 per month, last 30 lasted 3 months Last UDS date: 11/21/2018 Opioid Treatment Agreement signed (Y/N):Y 02/18/2019 Opioid Treatment Agreement last reviewed with patient:Y  NCCSRS reviewed this encounter (include red flags):no red flags except multiple pharmacies, reviewed 05/14/2019  Went to [facility] once given insurance was not covering.. plan to only go to [facility] from now on as using [facility].

## 2019-05-14 NOTE — Assessment & Plan Note (Signed)
Well controlled. Continue current medication.  

## 2019-05-14 NOTE — Patient Instructions (Addendum)
Call to set up appt for cervical radiculopathy with Dr. Ernestina Patches.  Can start diclofenac twice  Daily to help with shoulder pain.  Limit oxycodone use. Cancel 05/2019 appt. Reschedule for 08/13/2019

## 2019-05-28 ENCOUNTER — Ambulatory Visit: Payer: Medicare Other | Admitting: Physical Medicine and Rehabilitation

## 2019-05-31 ENCOUNTER — Ambulatory Visit: Payer: Medicare Other | Admitting: Family Medicine

## 2019-06-06 ENCOUNTER — Ambulatory Visit: Payer: Medicare Other | Admitting: Family Medicine

## 2019-06-18 ENCOUNTER — Ambulatory Visit: Payer: Medicare Other | Admitting: Physical Medicine and Rehabilitation

## 2019-06-25 ENCOUNTER — Encounter: Payer: Self-pay | Admitting: Physical Medicine and Rehabilitation

## 2019-06-25 ENCOUNTER — Ambulatory Visit (INDEPENDENT_AMBULATORY_CARE_PROVIDER_SITE_OTHER): Payer: Medicare Other | Admitting: Physical Medicine and Rehabilitation

## 2019-06-25 ENCOUNTER — Telehealth: Payer: Self-pay | Admitting: Physical Medicine and Rehabilitation

## 2019-06-25 VITALS — BP 168/82 | HR 75

## 2019-06-25 DIAGNOSIS — M5416 Radiculopathy, lumbar region: Secondary | ICD-10-CM

## 2019-06-25 DIAGNOSIS — M48062 Spinal stenosis, lumbar region with neurogenic claudication: Secondary | ICD-10-CM

## 2019-06-25 DIAGNOSIS — M5412 Radiculopathy, cervical region: Secondary | ICD-10-CM | POA: Diagnosis not present

## 2019-06-25 DIAGNOSIS — G8929 Other chronic pain: Secondary | ICD-10-CM

## 2019-06-25 DIAGNOSIS — M25512 Pain in left shoulder: Secondary | ICD-10-CM

## 2019-06-25 DIAGNOSIS — M25511 Pain in right shoulder: Secondary | ICD-10-CM | POA: Diagnosis not present

## 2019-06-25 DIAGNOSIS — M4322 Fusion of spine, cervical region: Secondary | ICD-10-CM | POA: Diagnosis not present

## 2019-06-25 DIAGNOSIS — M961 Postlaminectomy syndrome, not elsewhere classified: Secondary | ICD-10-CM

## 2019-06-25 NOTE — Progress Notes (Signed)
  Numeric Pain Rating Scale and Functional Assessment Average Pain 8 Pain Right Now 5 My pain is constant and sharp Pain is worse with: sitting and standing Pain improves with: medication   In the last MONTH (on 0-10 scale) has pain interfered with the following?  1. General activity like being  able to carry out your everyday physical activities such as walking, climbing stairs, carrying groceries, or moving a chair?  Rating(7)  2. Relation with others like being able to carry out your usual social activities and roles such as  activities at home, at work and in your community. Rating(7)  3. Enjoyment of life such that you have  been bothered by emotional problems such as feeling anxious, depressed or irritable?  Rating(10)

## 2019-06-26 NOTE — Telephone Encounter (Signed)
Per [employer] online portal no PA is needed for [id number].

## 2019-06-27 ENCOUNTER — Encounter: Payer: Self-pay | Admitting: Physical Medicine and Rehabilitation

## 2019-06-27 NOTE — Progress Notes (Signed)
Adrian Cox - 58 y.o. male MRN [id number]  Date of birth: 10-Mar-1961  Office Visit Note: Visit Date: 06/25/2019 PCP: Excell SeltzerBedsole, Amy E, MD Referred by: Excell SeltzerBedsole, Amy E, MD  Subjective: Chief Complaint  Patient presents with   Lower Back - Pain   Left Shoulder - Pain   Neck - Pain   Right Shoulder - Pain   Left Leg - Pain   Right Leg - Pain   HPI: Adrian Adrian Cox is a 58 y.o. male who comes in today For evaluation management of new problem at least for me is right shoulder neck pain which is been ongoing chronic for many years with worsening over the last 6 months.  He is getting mostly shoulder pain into the upper arm that appears to be worse in cold weather and worse with activity but also can just be worse at night.  He denies any real paresthesias in the hands.  No prior electrodiagnostic study of the neck that he is aware of.  He does have a history of C6-7 ACDF.  He had cervical spine x-rays in 2016 and 2018 both showing solid fusion with some adjacent level spondylosis.  He has not had any advanced imaging since 2007.  Prior ACDF was performed by Dr. Coletta MemosKyle Cabbell.  He reports no new injury no trauma.  He does get again more right than left shoulder pain.  He has had work-up of his right shoulder in the past through Dr. Gaspar BiddingMichael Rigby and some other physicians including his family physicians.  He denies any focal weakness.  By way of review the last time I saw the patient was in early 2019 we completed epidural injection with some relief of symptoms of his low back.  He has had prior laminectomy at L5-S1 also by Dr. Franky Machoabbell.  He has had MRI of the lumbar spine more recently showing pretty severe disease at L4-5 and L5-S1 with facet arthropathy and listhesis and stenosis.  He also has significant facet joint cyst in the laminectomy bed at L5-S1.  Unfortunately he needed surgical referral at the time and Dr. Otelia SergeantNitka wanted his notes from Dr. Franky Machoabbell and evidently according to the notes in  the chart they were not delivered in a very timely fashion and so the patient was not getting consultation by Dr. Otelia SergeantNitka.  We then sent the patient a referral to [facility].  It appears that they tried calling the patient many times and never set up an appointment with him.  He has since gone on just to continue to have chronic pain of the low back and bilateral legs.  He gets worse pain with standing and walking consistent with neurogenic claudication and stenosis.  He again has had no new trauma pain is constant and sharp.  Medication tends to help to some degree and he is on a pain agreement with opioid medications through Dr. Ermalene SearingBedsole.  He is unable to be seen by Dr. Franky Machoabbell or [facility] do to some issues with pain in her insurance.  Review of Systems  Constitutional: Negative for chills, fever, malaise/fatigue and weight loss.  HENT: Negative for hearing loss and sinus pain.   Eyes: Negative for blurred vision, double vision and photophobia.  Respiratory: Negative for cough and shortness of breath.   Cardiovascular: Negative for chest pain, palpitations and leg swelling.  Gastrointestinal: Negative for abdominal pain, nausea and vomiting.  Genitourinary: Negative for flank pain.  Musculoskeletal:  Positive for back pain, falls, joint pain and neck pain. Negative for myalgias.  Skin: Negative for itching and rash.  Neurological: Negative for tremors, focal weakness and weakness.  Endo/Heme/Allergies: Negative.   Psychiatric/Behavioral: Negative for depression.  All other systems reviewed and are negative.  Otherwise per HPI.  Assessment & Plan: Visit Diagnoses:  1. Radiculopathy, cervical region   2. Cervical vertebral fusion   3. Chronic pain of both shoulders   4. Post laminectomy syndrome   5. Spinal stenosis of lumbar region with neurogenic claudication   6. Lumbar radiculopathy     Plan: Findings:  1.  Neck pain  and cervicalgia with referral in the right more than left shoulder radiating down into the forearms to some degree without paresthesias in the hands.  He has prior ACDF at C6-7.  He has not had any advanced imaging since 2007 that I can see.  X-rays do show solid fusion with some adjacent level spondylosis.  I think as part of the work-up he needs MRI of the cervical spine.  We will obtain that and then look at further treatment plans which could include epidural injection and I cannot rule out that maybe he has an intrinsic shoulder issue or myofascial pain.  Exam does not show large trigger points but there is some taut bands.  He has some impingement signs with his not reproducing all of his pain.  2.  Once again the patient is having low back pain worse with standing and ambulating radiates down both legs consistent with lumbar stenosis.  He has MRI findings of severe stenosis multifactorial L4-5 prior laminectomy at L5-S1 he does have listhesis he also has facet joint cyst pretty significant particular at L5-S1 in the laminectomy bed.  Patient probably does need this surgically looked at which would probably be a lumbar fusion.  Once again we are going to try to get him to see Dr. Otelia Sergeant.  I realize maybe they had trouble getting notes from Dr. Franky Macho maybe they can try again but I think we are at a loss in terms of any interventional injections.    Meds & Orders: No orders of the defined types were placed in this encounter.   Orders Placed This Encounter  Procedures   MR CERVICAL SPINE WO CONTRAST    Follow-up: Return for MRI review after completion.   Procedures: No procedures performed  No notes on file   Clinical History: RIGHT SHOULDER - 2+ VIEW  COMPARISON:  Plain films right shoulder 10/31/2014.  FINDINGS: There is no evidence of fracture or dislocation. 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 The glenohumeral joint is unremarkable. Imaged right lung and ribs appear  normal. Soft tissues are unremarkable.  IMPRESSION: No notable change in mild acromioclavicular osteoarthritis. The study is otherwise negative.   Electronically Signed   By: Drusilla Kanner M.D.   On: 01/09/2017 15:50 ----------- EXAM: CERVICAL SPINE - 2-3 VIEW  COMPARISON: 10/31/2014  FINDINGS: C6-7 ACDF with solid bony fusion.  Degenerative disc narrowing throughout the unfused levels, most advanced at C3-4 and C4-5 where there is greatest ventral spurring. Slight retrolisthesis versus posterior ridging at C3-4, C4-5, and C5-6.  No evidence of bone lesion or endplate erosion.  IMPRESSION: 1. Diffuse degenerative disc narrowing and spurring, stable from 10/31/2014. 2. C6-7 ACDF with solid bony fusion.   Electronically Signed By: Marnee Spring M.D. On: 02/23/2017 09:35 ------ MRI LUMBAR SPINE WITHOUT AND WITH CONTRAST  TECHNIQUE: Multiplanar and multiecho pulse sequences of the lumbar spine were  obtained without and with intravenous contrast.  CONTRAST: 19mL MULTIHANCE GADOBENATE DIMEGLUMINE 529 MG/ML IV SOLN  COMPARISON: Lumbar spine radiographs November 09, 2017  FINDINGS: SEGMENTATION: For the purposes of this report, the last well-formed intervertebral disc is reported as L5-S1.  ALIGNMENT: Grade 1 L4-5 anterolisthesis and grade 1 L5-S1 retrolisthesis.  VERTEBRAE: Vertebral bodies are intact. Moderate L4-5, moderate to severe L5-S1 disc height loss with disc desiccation compatible with degenerative discs. Mild associated chronic discogenic endplate changes. No acute abnormal bone marrow signal. No suspicious osseous or disc enhancement. L2 superior endplate old Schmorl's node.  CONUS MEDULLARIS AND CAUDA EQUINA: Conus medullaris terminates at T12-L1 and demonstrates normal morphology and signal characteristics. Cauda equina is normal. No abnormal cord, leptomeningeal or epidural enhancement.  PARASPINAL AND OTHER SOFT TISSUES:  Included prevertebral and paraspinal soft tissues are normal.  DISC LEVELS:  T12-L1, L1-2, L2-3: No disc bulge, canal stenosis nor neural foraminal narrowing.  L3-4: Annular bulging, small RIGHT extraforaminal disc protrusion. Minimal facet arthropathy and ligamentum flavum redundancy without canal stenosis. Mild RIGHT greater than LEFT neural foraminal narrowing.  L4-5: Anterolisthesis. Moderate broad-based disc bulge. Moderate to severe facet arthropathy with trace reactive effusions. Moderate to severe canal stenosis. Moderate to severe RIGHT, moderate LEFT neural foraminal narrowing. Peripherally enhancing bilateral facet synovial cysts within bilateral paraspinal soft tissues measuring to 14 mm.  L5-S1: Status post LEFT laminectomy. Nonenhancing 7 x 11 x 12 mm cyst within laminectomy surgical bed. Retrolisthesis. Moderate RIGHT central disc protrusion with enhancing annular fissure/discectomy defect. No canal stenosis though, granulation tissue and narrowing the lateral recesses may affect the traversing S1 nerves. Minimal facet arthropathy. No canal stenosis.  IMPRESSION: 1. Status post LEFT L5 hemilaminectomy. Grade 1 L4-5 anterolisthesis and grade 1 L5-S1 retrolisthesis. No acute osseous process. 2. 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 L5-S1 lateral recess stenosis and granulation tissue may affect the traversing S1 nerves. 3. Neural foraminal narrowing L3-4 through L5-S1: Moderate to severe on the RIGHT at L4-5.   Electronically Signed By: Awilda Metroourtnay Bloomer M.D. On: 11/20/2017 22:39   He reports that he has never smoked. He has never used smokeless tobacco. No results for input(s): HGBA1C, LABURIC in the last 8760 hours.  Objective:  VS:  HT:     WT:    BMI:      BP:(!) 168/82   HR:75bpm   TEMP: ( )   RESP:  Physical Exam Vitals signs and nursing note reviewed.  Constitutional:      General: He is not in acute distress.    Appearance: He is  well-developed. He is obese.  HENT:     Head: Normocephalic and atraumatic.     Nose: Nose normal.     Mouth/Throat:     Mouth: Mucous membranes are moist.     Pharynx: Oropharynx is clear.  Eyes:     Conjunctiva/sclera: Conjunctivae normal.     Pupils: Pupils are equal, round, and reactive to light.  Neck:     Musculoskeletal: Neck supple. Muscular tenderness present. No neck rigidity.     Trachea: No tracheal deviation.  Cardiovascular:     Rate and Rhythm: Normal rate and regular rhythm.     Pulses: Normal pulses.  Pulmonary:     Effort: Pulmonary effort is normal.     Breath sounds: Normal breath sounds.  Abdominal:     General: There is no distension.     Palpations: Abdomen is soft.     Tenderness: There is no guarding or rebound.  Musculoskeletal:  General: No deformity.     Right lower leg: No edema.     Left lower leg: No edema.     Comments: Patient slow to arise from a seated position and does have pain with extension rotation and facet loading of the lumbar spine.  He has no pain with hip rotation he has good distal strength without clonus.  Cervical exam shows patient sits with forward flexed cervical spine has some pain at end ranges of rotation has some limited rotation range of motion.  No real focal trigger points but some taut bands seen in the levator scapula and trapezius.  Shoulder on the right shows some impingement with external rotation more than internal rotation.  This does not reproduce all of his shoulder pain.  Left shoulder does not show impingement.  He has good strength in the upper extremities bilaterally with good wrist extension and long finger flexion and abduction.  He has negative Hoffmann sign bilaterally.  Lymphadenopathy:     Cervical: No cervical adenopathy.  Skin:    General: Skin is warm and dry.     Findings: No erythema or rash.  Neurological:     General: No focal deficit present.     Mental Status: He is alert and oriented to  person, place, and time.     Motor: No abnormal muscle tone.     Coordination: Coordination normal.     Gait: Gait normal.  Psychiatric:        Mood and Affect: Mood normal.        Behavior: Behavior normal.        Thought Content: Thought content normal.     Ortho Exam Imaging: No results found.  Past Medical/Family/Surgical/Social History: Medications & Allergies reviewed per EMR, new medications updated. Patient Active Problem List   Diagnosis Date Noted   Advice Given About Covid-19 Virus Infection 03/28/2019   Left leg pain 02/28/2019   Elevated liver function tests 11/20/2018   Chronic pain syndrome 11/20/2018   Cervical radiculopathy 09/05/2018   Lumbar radiculopathy 08/17/2018   Pain in right thigh 03/27/2018   Lateral knee pain, right 03/27/2018   Marijuana abuse, continuous 11/14/2017   Chronic low back pain with sciatica 11/09/2017   Osteoarthritis of spine 02/23/2017   Chronic right shoulder pain 01/09/2017   Essential hypertension 08/17/2016   Bipolar disorder ([place]) 08/17/2016   Hyperlipidemia 08/17/2016   Past Medical History:  Diagnosis Date   Depression    bipolar   Diverticulosis    Hyperlipidemia    Hypertension    Mold exposure    Family History  Problem Relation Age of Onset   Cancer Mother    Colon cancer Mother 40   Past Surgical History:  Procedure Laterality Date   [place]  2003   fusion   KNEE ARTHROSCOPY Right    KNEE ARTHROSCOPY Left    SPINE SURGERY     Social History   Occupational History   Not on file  Tobacco Use   Smoking status: Never Smoker   Smokeless tobacco: Never Used  Substance and Sexual Activity   Alcohol use: No   Drug use: Yes    Types: Marijuana    Comment: Hx marijuana use - none x 2 yrs   Sexual activity: Yes

## 2019-07-03 ENCOUNTER — Ambulatory Visit: Payer: Self-pay

## 2019-07-03 ENCOUNTER — Ambulatory Visit (INDEPENDENT_AMBULATORY_CARE_PROVIDER_SITE_OTHER): Payer: Medicare Other | Admitting: Physical Medicine and Rehabilitation

## 2019-07-03 ENCOUNTER — Encounter: Payer: Self-pay | Admitting: Physical Medicine and Rehabilitation

## 2019-07-03 VITALS — BP 142/86 | HR 73

## 2019-07-03 DIAGNOSIS — M5416 Radiculopathy, lumbar region: Secondary | ICD-10-CM | POA: Diagnosis not present

## 2019-07-03 DIAGNOSIS — M48062 Spinal stenosis, lumbar region with neurogenic claudication: Secondary | ICD-10-CM

## 2019-07-03 MED ORDER — BETAMETHASONE SOD PHOS & ACET 6 (3-3) MG/ML IJ SUSP
12.0000 mg | Freq: Once | INTRAMUSCULAR | Status: AC
  Administered 2019-07-03: 12 mg

## 2019-07-03 NOTE — Progress Notes (Signed)
Pt states pain in both legs all the way down. Pt states no major changes since last visit 06/25/2019.  .Numeric Pain Rating Scale and Functional Assessment Average Pain 9   In the last MONTH (on 0-10 scale) has pain interfered with the following?  1. General activity like being  able to carry out your everyday physical activities such as walking, climbing stairs, carrying groceries, or moving a chair?  Rating(9)   +Driver, -BT, -Dye Allergies.

## 2019-07-20 ENCOUNTER — Other Ambulatory Visit: Payer: Self-pay

## 2019-07-20 ENCOUNTER — Ambulatory Visit
Admission: RE | Admit: 2019-07-20 | Discharge: 2019-07-20 | Disposition: A | Discharge status: Home / Self Care | Payer: Medicare Other | Source: Ambulatory Visit | Attending: Physical Medicine and Rehabilitation | Admitting: Physical Medicine and Rehabilitation

## 2019-07-20 DIAGNOSIS — M5412 Radiculopathy, cervical region: Secondary | ICD-10-CM

## 2019-07-20 DIAGNOSIS — M4322 Fusion of spine, cervical region: Secondary | ICD-10-CM

## 2019-07-20 DIAGNOSIS — M4802 Spinal stenosis, cervical region: Secondary | ICD-10-CM | POA: Diagnosis not present

## 2019-07-21 NOTE — Progress Notes (Signed)
Adrian Cox - 58 y.o. male MRN [id number]  Date of birth: 1961/06/02  Office Visit Note: Visit Date: 07/03/2019 PCP: Excell Seltzer, MD Referred by: Excell Seltzer, MD  Subjective: Chief Complaint  Patient presents with  . Right Leg - Pain  . Left Leg - Pain   HPI:  Adrian Cox is a 58 y.o. male who comes in today For planned bilateral L4 transforaminal epidural steroid injection.  Please see our prior evaluation and management note for further details and justification.  Radicular pain down both legs no changes since last visit.  Still awaiting cervical MRI.  ROS Otherwise per HPI.  Assessment & Plan: Visit Diagnoses:  1. Lumbar radiculopathy   2. Spinal stenosis of lumbar region with neurogenic claudication     Plan: No additional findings.   Meds & Orders:  Meds ordered this encounter  Medications  . betamethasone acetate-betamethasone sodium phosphate (CELESTONE) injection 12 mg    Orders Placed This Encounter  Procedures  . XR C-ARM NO REPORT  . Epidural Steroid injection    Follow-up: Return if symptoms worsen or fail to improve.   Procedures: No procedures performed  Lumbosacral Transforaminal Epidural Steroid Injection - Sub-Pedicular Approach with Fluoroscopic Guidance  Patient: KENYETTA FIFE      Date of Birth: November 22, 1960 MRN: [id number] PCP: Excell Seltzer, MD      Visit Date: 07/03/2019   Universal Protocol:    Date/Time: 07/03/2019  Consent Given By: the patient  Position: PRONE  Additional Comments: Vital signs were monitored before and after the procedure. Patient was prepped and draped in the usual sterile fashion. The correct patient, procedure, and site was verified.   Injection Procedure Details:  Procedure Site One Meds Administered:  Meds ordered this encounter  Medications  . betamethasone acetate-betamethasone sodium phosphate (CELESTONE) injection 12 mg    Laterality: Bilateral  Location/Site:  L4-L5  Needle  size: 22 G  Needle type: Spinal  Needle Placement: Transforaminal  Findings:    -Comments: Excellent flow of contrast along the nerve and into the epidural space.  Procedure Details: After squaring off the end-plates to get a true AP view, the C-arm was positioned so that an oblique view of the foramen as noted above was visualized. The target area is just inferior to the "nose of the scotty dog" or sub pedicular. The soft tissues overlying this structure were infiltrated with 2-3 ml. of 1% Lidocaine without Epinephrine.  The spinal needle was inserted toward the target using a "trajectory" view along the fluoroscope beam.  Under AP and lateral visualization, the needle was advanced so it did not puncture dura and was located close the 6 O'Clock position of the pedical in AP tracterory. Biplanar projections were used to confirm position. Aspiration was confirmed to be negative for CSF and/or blood. A 1-2 ml. volume of Isovue-250 was injected and flow of contrast was noted at each level. Radiographs were obtained for documentation purposes.   After attaining the desired flow of contrast documented above, a 0.5 to 1.0 ml test dose of 0.25% Marcaine was injected into each respective transforaminal space.  The patient was observed for 90 seconds post injection.  After no sensory deficits were reported, and normal lower extremity motor function was noted,   the above injectate was administered so that equal amounts of the injectate were placed at each foramen (level) into the transforaminal epidural space.   Additional Comments:  The patient tolerated the procedure well Dressing: 2  x 2 sterile gauze and Band-Aid    Post-procedure details: Patient was observed during the procedure. Post-procedure instructions were reviewed.  Patient left the clinic in stable condition.    Clinical History: RIGHT SHOULDER - 2+ VIEW  COMPARISON:  Plain films right shoulder 10/31/2014.  FINDINGS: There  is no evidence of fracture or dislocation. 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 The glenohumeral joint is unremarkable. Imaged right lung and ribs appear normal. Soft tissues are unremarkable.  IMPRESSION: No notable change in mild acromioclavicular osteoarthritis. The study is otherwise negative.   Electronically Signed   By: Inge Rise M.D.   On: 01/09/2017 15:50 ----------- EXAM: CERVICAL SPINE - 2-3 VIEW  COMPARISON: 10/31/2014  FINDINGS: C6-7 ACDF with solid bony fusion.  Degenerative disc narrowing throughout the unfused levels, most advanced at C3-4 and C4-5 where there is greatest ventral spurring. Slight retrolisthesis versus posterior ridging at C3-4, C4-5, and C5-6.  No evidence of bone lesion or endplate erosion.  IMPRESSION: 1. Diffuse degenerative disc narrowing and spurring, stable from 10/31/2014. 2. C6-7 ACDF with solid bony fusion.   Electronically Signed By: Monte Fantasia M.D. On: 02/23/2017 09:35 ------ MRI LUMBAR SPINE WITHOUT AND WITH CONTRAST  TECHNIQUE: Multiplanar and multiecho pulse sequences of the lumbar spine were obtained without and with intravenous contrast.  CONTRAST: 33mL MULTIHANCE GADOBENATE DIMEGLUMINE 529 MG/ML IV SOLN  COMPARISON: Lumbar spine radiographs November 09, 2017  FINDINGS: SEGMENTATION: For the purposes of this report, the last well-formed intervertebral disc is reported as L5-S1.  ALIGNMENT: Grade 1 L4-5 anterolisthesis and grade 1 L5-S1 retrolisthesis.  VERTEBRAE: Vertebral bodies are intact. Moderate L4-5, moderate to severe L5-S1 disc height loss with disc desiccation compatible with degenerative discs. Mild associated chronic discogenic endplate changes. No acute abnormal bone marrow signal. No suspicious osseous or disc enhancement. L2 superior endplate old Schmorl's node.  CONUS MEDULLARIS AND CAUDA EQUINA: Conus medullaris terminates at T12-L1 and demonstrates  normal morphology and signal characteristics. Cauda equina is normal. No abnormal cord, leptomeningeal or epidural enhancement.  PARASPINAL AND OTHER SOFT TISSUES: Included prevertebral and paraspinal soft tissues are normal.  DISC LEVELS:  T12-L1, L1-2, L2-3: No disc bulge, canal stenosis nor neural foraminal narrowing.  L3-4: Annular bulging, small RIGHT extraforaminal disc protrusion. Minimal facet arthropathy and ligamentum flavum redundancy without canal stenosis. Mild RIGHT greater than LEFT neural foraminal narrowing.  L4-5: Anterolisthesis. Moderate broad-based disc bulge. Moderate to severe facet arthropathy with trace reactive effusions. Moderate to severe canal stenosis. Moderate to severe RIGHT, moderate LEFT neural foraminal narrowing. Peripherally enhancing bilateral facet synovial cysts within bilateral paraspinal soft tissues measuring to 14 mm.  L5-S1: Status post LEFT laminectomy. Nonenhancing 7 x 11 x 12 mm cyst within laminectomy surgical bed. Retrolisthesis. Moderate RIGHT central disc protrusion with enhancing annular fissure/discectomy defect. No canal stenosis though, granulation tissue and narrowing the lateral recesses may affect the traversing S1 nerves. Minimal facet arthropathy. No canal stenosis.  IMPRESSION: 1. Status post LEFT L5 hemilaminectomy. Grade 1 L4-5 anterolisthesis and grade 1 L5-S1 retrolisthesis. No acute osseous process. 2. 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 L5-S1 lateral recess stenosis and granulation tissue may affect the traversing S1 nerves. 3. Neural foraminal narrowing L3-4 through L5-S1: Moderate to severe on the RIGHT at L4-5.   Electronically Signed By: Elon Alas M.D. On: 11/20/2017 22:39     Objective:  VS:  HT:    WT:   BMI:     BP:(!) 142/86  HR:73bpm  TEMP: ( )  RESP:  Physical Exam  Ortho Exam Imaging: No results found.

## 2019-07-21 NOTE — Procedures (Signed)
Lumbosacral Transforaminal Epidural Steroid Injection - Sub-Pedicular Approach with Fluoroscopic Guidance  [name]      Date of Birth: 1961-08-12 MRN: [id number] PCP: Jinny Sanders, MD      Visit Date: 07/03/2019   Universal Protocol:    Date/Time: 07/03/2019  Consent Given By: the patient  Position: PRONE  Additional Comments: Vital signs were monitored before and after the procedure. Patient was prepped and draped in the usual sterile fashion. The correct patient, procedure, and site was verified.   Injection Procedure Details:  Procedure Site One Meds Administered:  Meds ordered this encounter  Medications  . betamethasone acetate-betamethasone sodium phosphate (CELESTONE) injection 12 mg    Laterality: Bilateral  Location/Site:  L4-L5  Needle size: 22 G  Needle type: Spinal  Needle Placement: Transforaminal  Findings:    -Comments: Excellent flow of contrast along the nerve and into the epidural space.  Procedure Details: After squaring off the end-plates to get a true AP view, the C-arm was positioned so that an oblique view of the foramen as noted above was visualized. The target area is just inferior to the "nose of the scotty dog" or sub pedicular. The soft tissues overlying this structure were infiltrated with 2-3 ml. of 1% Lidocaine without Epinephrine.  The spinal needle was inserted toward the target using a "trajectory" view along the fluoroscope beam.  Under AP and lateral visualization, the needle was advanced so it did not puncture dura and was located close the 6 O'Clock position of the pedical in AP tracterory. Biplanar projections were used to confirm position. Aspiration was confirmed to be negative for CSF and/or blood. A 1-2 ml. volume of Isovue-250 was injected and flow of contrast was noted at each level. Radiographs were obtained for documentation purposes.   After attaining the desired flow of contrast documented above, a 0.5  to 1.0 ml test dose of 0.25% Marcaine was injected into each respective transforaminal space.  The patient was observed for 90 seconds post injection.  After no sensory deficits were reported, and normal lower extremity motor function was noted,   the above injectate was administered so that equal amounts of the injectate were placed at each foramen (level) into the transforaminal epidural space.   Additional Comments:  The patient tolerated the procedure well Dressing: 2 x 2 sterile gauze and Band-Aid    Post-procedure details: Patient was observed during the procedure. Post-procedure instructions were reviewed.  Patient left the clinic in stable condition.

## 2019-07-23 ENCOUNTER — Ambulatory Visit (INDEPENDENT_AMBULATORY_CARE_PROVIDER_SITE_OTHER): Payer: Medicare Other | Admitting: Family Medicine

## 2019-07-23 ENCOUNTER — Other Ambulatory Visit: Payer: Self-pay

## 2019-07-23 ENCOUNTER — Encounter: Payer: Self-pay | Admitting: Family Medicine

## 2019-07-23 VITALS — BP 148/78 | HR 84 | Temp 98.4°F | Ht 72.5 in | Wt 221.8 lb

## 2019-07-23 DIAGNOSIS — G8929 Other chronic pain: Secondary | ICD-10-CM

## 2019-07-23 DIAGNOSIS — M25511 Pain in right shoulder: Secondary | ICD-10-CM

## 2019-07-23 DIAGNOSIS — F1210 Cannabis abuse, uncomplicated: Secondary | ICD-10-CM | POA: Diagnosis not present

## 2019-07-23 DIAGNOSIS — G894 Chronic pain syndrome: Secondary | ICD-10-CM | POA: Diagnosis not present

## 2019-07-23 DIAGNOSIS — M5440 Lumbago with sciatica, unspecified side: Secondary | ICD-10-CM

## 2019-07-23 MED ORDER — OXYCODONE HCL 5 MG PO CAPS: 5.0000 mg | ORAL_CAPSULE | Freq: Every day | ORAL | 0 refills | Status: DC

## 2019-07-23 NOTE — Assessment & Plan Note (Signed)
He reports he has quit.

## 2019-07-23 NOTE — Assessment & Plan Note (Signed)
Recent MRi and upcoming appt with Dr. Ernestina Patches.

## 2019-07-23 NOTE — Assessment & Plan Note (Signed)
IMproving with steroid injections. Limited oxycodone use and lyrica BID.

## 2019-07-23 NOTE — Progress Notes (Signed)
Chief Complaint  Patient presents with  . Follow-up    History of Present Illness: HPI   58 year old male presents for chronic pain management.   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 Franky Macho.  He has had MRI of the lumbar spine more recently showing pretty severe disease at L4-5 and L5-S1 with facet arthropathy and listhesis and stenosis.  visit for epidural with Dr. Alvester Morin on 9/16.  he has had improvement in low back pain with this.  He has also continued to have chronic shoulder pain. He had cervical spine x-rays in 2016 and 2018 both showing solid fusion with some adjacent level spondylosis.  He has not had any advanced imaging since 2007.  Prior ACDF was performed by Dr. Coletta Memos.  He reports no new injury no trauma  Had recent for MRI cervical spine... has follow up with Dr. Alvester Morin tommorow.   He is  on lyrica 150 mg BID for chronic pain.    Using oxycodone 5 mg daily prn.  Refill needed at this point. Indication for chronic opioid:Chronic back pain and  Right shoulder neck pain.. Medication and dose:oxycodone 5 mg daily # pills per month: usually < 30 per month, last 30 lasted 3 months. Last UDS date: 11/21/2018 Opioid Treatment Agreement signed (Y/N):Y 02/18/2019 Opioid Treatment Agreement last reviewed with patient:Y  [facility] reviewed this encounter (include red flags):no red flags except multiple pharmacies, reviewed 07/23/19.   COVID 19 screen No recent travel or known exposure to COVID19 The patient denies respiratory symptoms of COVID 19 at this time.  The importance of social distancing was discussed today.   Review of Systems  Constitutional: Negative for chills and fever.  HENT: Negative for congestion and ear pain.   Eyes: Negative for pain and redness.  Respiratory: Negative for cough and shortness of breath.   Cardiovascular: Negative for chest pain,  palpitations and leg swelling.  Gastrointestinal: Negative for abdominal pain, blood in stool, constipation, diarrhea, nausea and vomiting.  Genitourinary: Negative for dysuria.  Musculoskeletal: Negative for falls and myalgias.  Skin: Negative for rash.  Neurological: Negative for dizziness.  Psychiatric/Behavioral: Negative for depression. The patient is not nervous/anxious.       Past Medical History:  Diagnosis Date  . Depression    bipolar  . Diverticulosis   . Hyperlipidemia   . Hypertension   . Mold exposure     reports that he has never smoked. He has never used smokeless tobacco. He reports current drug use. Drug: Marijuana. He reports that he does not drink alcohol.   Current Outpatient Medications:  .  cholecalciferol (VITAMIN D) 1000 units tablet, Take 1,000 Units by mouth daily., Disp: , Rfl:  .  lisinopril (ZESTRIL) 10 MG tablet, Take 1 tablet (10 mg total) by mouth daily., Disp: 90 tablet, Rfl: 3 .  oxycodone (OXY-IR) 5 MG capsule, Take 1 capsule (5 mg total) by mouth daily., Disp: 30 capsule, Rfl: 0 .  pregabalin (LYRICA) 150 MG capsule, Take 1 capsule (150 mg total) by mouth 2 (two) times daily., Disp: 60 capsule, Rfl: 2   Observations/Objective: Blood pressure (!) 148/78, pulse 84, temperature 98.4 F (36.9 C), temperature source Temporal, height 6' 0.5" (1.842 m), weight 221 lb 12 oz (100.6 kg), SpO2 94 %.  Physical Exam Constitutional:      Appearance: He is well-developed.  HENT:     Head: Normocephalic.     Right Ear: Hearing normal.  Left Ear: Hearing normal.     Nose: Nose normal.  Neck:     Thyroid: No thyroid mass or thyromegaly.     Vascular: No carotid bruit.     Trachea: Trachea normal.  Cardiovascular:     Rate and Rhythm: Normal rate and regular rhythm.     Pulses: Normal pulses.     Heart sounds: Heart sounds not distant. No murmur. No friction rub. No gallop.      Comments: No peripheral edema Pulmonary:     Effort: Pulmonary effort  is normal. No respiratory distress.     Breath sounds: Normal breath sounds.  Musculoskeletal:     Cervical back: He exhibits decreased range of motion and tenderness.     Lumbar back: Normal. He exhibits normal range of motion, no tenderness and no bony tenderness.  Skin:    General: Skin is warm and dry.     Findings: No rash.  Psychiatric:        Speech: Speech normal.        Behavior: Behavior normal.        Thought Content: Thought content normal.      Assessment and Plan   Chronic low back pain with sciatica IMproving with steroid injections. Limited oxycodone use and lyrica BID.  Marijuana abuse, continuous He reports he has quit.  Chronic right shoulder pain Recent MRi and upcoming appt with Dr. Ernestina Patches.  Chronic pain syndrome No red flags. Reviewed [place].     Eliezer Lofts, MD

## 2019-07-23 NOTE — Assessment & Plan Note (Signed)
No red flags. Reviewed [place].

## 2019-07-24 ENCOUNTER — Encounter: Payer: Self-pay | Admitting: Physical Medicine and Rehabilitation

## 2019-07-24 ENCOUNTER — Ambulatory Visit (INDEPENDENT_AMBULATORY_CARE_PROVIDER_SITE_OTHER): Payer: Medicare Other | Admitting: Physical Medicine and Rehabilitation

## 2019-07-24 ENCOUNTER — Telehealth: Payer: Self-pay | Admitting: Physical Medicine and Rehabilitation

## 2019-07-24 VITALS — BP 185/90 | HR 89

## 2019-07-24 DIAGNOSIS — M47812 Spondylosis without myelopathy or radiculopathy, cervical region: Secondary | ICD-10-CM

## 2019-07-24 DIAGNOSIS — M7918 Myalgia, other site: Secondary | ICD-10-CM | POA: Diagnosis not present

## 2019-07-24 DIAGNOSIS — M542 Cervicalgia: Secondary | ICD-10-CM

## 2019-07-24 DIAGNOSIS — M961 Postlaminectomy syndrome, not elsewhere classified: Secondary | ICD-10-CM | POA: Diagnosis not present

## 2019-07-24 NOTE — Progress Notes (Signed)
.  Numeric Pain Rating Scale and Functional Assessment Average Pain 8 Pain Right Now 7 My pain is constant, sharp and aching Pain is worse with: some activites Pain improves with: rest   In the last MONTH (on 0-10 scale) has pain interfered with the following?  1. General activity like being  able to carry out your everyday physical activities such as walking, climbing stairs, carrying groceries, or moving a chair?  Rating(6)  2. Relation with others like being able to carry out your usual social activities and roles such as  activities at home, at work and in your community. Rating(6)  3. Enjoyment of life such that you have  been bothered by emotional problems such as feeling anxious, depressed or irritable?  Rating(3)

## 2019-07-24 NOTE — Telephone Encounter (Signed)
Per [employer] online [place] no pa is needed for [id number].

## 2019-07-25 ENCOUNTER — Other Ambulatory Visit: Payer: Self-pay

## 2019-07-25 ENCOUNTER — Encounter: Payer: Self-pay | Admitting: Physical Medicine and Rehabilitation

## 2019-07-25 ENCOUNTER — Ambulatory Visit: Payer: Self-pay

## 2019-07-25 ENCOUNTER — Ambulatory Visit (INDEPENDENT_AMBULATORY_CARE_PROVIDER_SITE_OTHER): Payer: Medicare Other | Admitting: Physical Medicine and Rehabilitation

## 2019-07-25 VITALS — BP 175/88 | HR 96

## 2019-07-25 DIAGNOSIS — M47812 Spondylosis without myelopathy or radiculopathy, cervical region: Secondary | ICD-10-CM | POA: Diagnosis not present

## 2019-07-25 DIAGNOSIS — M542 Cervicalgia: Secondary | ICD-10-CM

## 2019-07-25 MED ORDER — METHYLPREDNISOLONE ACETATE 80 MG/ML IJ SUSP
80.0000 mg | Freq: Once | INTRAMUSCULAR | Status: AC
  Administered 2019-07-25: 80 mg

## 2019-07-25 NOTE — Progress Notes (Signed)
 .  Numeric Pain Rating Scale and Functional Assessment Average Pain 6   In the last MONTH (on 0-10 scale) has pain interfered with the following?  1. General activity like being  able to carry out your everyday physical activities such as walking, climbing stairs, carrying groceries, or moving a chair?  Rating(6)   +Driver, -BT, -Dye Allergies.  

## 2019-07-28 ENCOUNTER — Encounter: Payer: Self-pay | Admitting: Physical Medicine and Rehabilitation

## 2019-07-31 ENCOUNTER — Telehealth: Payer: Self-pay

## 2019-07-31 ENCOUNTER — Other Ambulatory Visit: Payer: Self-pay

## 2019-07-31 DIAGNOSIS — Z20828 Contact with and (suspected) exposure to other viral communicable diseases: Secondary | ICD-10-CM | POA: Diagnosis not present

## 2019-07-31 DIAGNOSIS — Z20822 Contact with and (suspected) exposure to covid-19: Secondary | ICD-10-CM

## 2019-07-31 NOTE — Telephone Encounter (Signed)
Agreed -

## 2019-07-31 NOTE — Telephone Encounter (Signed)
Pt said his wife has a S/T and she will not go get tested for covid.pt baby sat some nieces and nephews few days ago and they had runny noses.pts symptoms started on 07/29/19; Pt temp now is 98.7 but pt has chills; feels warm in pts house. Pt has runny nose and body aches(pt usually has body aches and joint pain)the patient had light h/a on 07/30/19. Pt feels somewhat weak. Pt has dry hacky cough.pt had lower abd pain on 07/29/19 and 07/30/19 but no abd pain today. Pt had diarrhea on 07/29/19. Pt has redness in rt eye; no itching or drainage. Pt is feeling fatigued. Pt also has nausea.  Pt wanted Dr Diona Browner to know his symptoms and to let her know he is going to [facility] location for covid testing today at 12 noon. Pt will self quarantine until gets results. ED precautions given and pt voiced understanding.

## 2019-08-01 ENCOUNTER — Telehealth: Payer: Self-pay | Admitting: Physical Medicine and Rehabilitation

## 2019-08-01 NOTE — Telephone Encounter (Signed)
Per [employer] online portal no PA is needed for [id number].

## 2019-08-02 LAB — NOVEL CORONAVIRUS, NAA: SARS-CoV-2, NAA: NOT DETECTED

## 2019-08-06 ENCOUNTER — Ambulatory Visit (INDEPENDENT_AMBULATORY_CARE_PROVIDER_SITE_OTHER): Payer: Medicare Other | Admitting: Psychology

## 2019-08-06 DIAGNOSIS — F411 Generalized anxiety disorder: Secondary | ICD-10-CM

## 2019-08-14 ENCOUNTER — Ambulatory Visit (INDEPENDENT_AMBULATORY_CARE_PROVIDER_SITE_OTHER): Payer: Medicare Other | Admitting: Psychology

## 2019-08-14 DIAGNOSIS — F411 Generalized anxiety disorder: Secondary | ICD-10-CM

## 2019-08-15 ENCOUNTER — Other Ambulatory Visit: Payer: Self-pay

## 2019-08-15 ENCOUNTER — Ambulatory Visit (INDEPENDENT_AMBULATORY_CARE_PROVIDER_SITE_OTHER): Payer: Medicare Other | Admitting: Family Medicine

## 2019-08-15 ENCOUNTER — Encounter: Payer: Self-pay | Admitting: Family Medicine

## 2019-08-15 VITALS — BP 160/98 | HR 68 | Temp 97.5°F | Ht 72.6 in | Wt 222.0 lb

## 2019-08-15 DIAGNOSIS — G894 Chronic pain syndrome: Secondary | ICD-10-CM

## 2019-08-15 DIAGNOSIS — R61 Generalized hyperhidrosis: Secondary | ICD-10-CM

## 2019-08-15 DIAGNOSIS — K5903 Drug induced constipation: Secondary | ICD-10-CM | POA: Diagnosis not present

## 2019-08-15 DIAGNOSIS — T402X5A Adverse effect of other opioids, initial encounter: Secondary | ICD-10-CM

## 2019-08-15 DIAGNOSIS — I10 Essential (primary) hypertension: Secondary | ICD-10-CM

## 2019-08-15 LAB — CBC WITH DIFFERENTIAL/PLATELET
Basophils Absolute: 0.1 10*3/uL (ref 0.0–0.1)
Basophils Relative: 0.7 % (ref 0.0–3.0)
Eosinophils Absolute: 0.1 10*3/uL (ref 0.0–0.7)
Eosinophils Relative: 1.2 % (ref 0.0–5.0)
HCT: 37.6 % — ABNORMAL LOW (ref 39.0–52.0)
Hemoglobin: 12.4 g/dL — ABNORMAL LOW (ref 13.0–17.0)
Lymphocytes Relative: 25.6 % (ref 12.0–46.0)
Lymphs Abs: 2.8 10*3/uL (ref 0.7–4.0)
MCHC: 33.1 g/dL (ref 30.0–36.0)
MCV: 98.1 fl (ref 78.0–100.0)
Monocytes Absolute: 0.7 10*3/uL (ref 0.1–1.0)
Monocytes Relative: 6 % (ref 3.0–12.0)
Neutro Abs: 7.4 10*3/uL (ref 1.4–7.7)
Neutrophils Relative %: 66.5 % (ref 43.0–77.0)
Platelets: 226 10*3/uL (ref 150.0–400.0)
RBC: 3.83 Mil/uL — ABNORMAL LOW (ref 4.22–5.81)
RDW: 14.3 % (ref 11.5–15.5)
WBC: 11.1 10*3/uL — ABNORMAL HIGH (ref 4.0–10.5)

## 2019-08-15 NOTE — Patient Instructions (Addendum)
Can use miralax daily for constipation.  Discuss with Dr. Ernestina Patches back pain.. consider increasing to lyrica 150 mg three times a day vs add diclofenac daily for inflammation.   Increase  Lisinopril to 2 a day, for 20 mg daily.  Please stop at the lab to have labs drawn.

## 2019-08-15 NOTE — Progress Notes (Signed)
Chief Complaint  Patient presents with  . Excessive Sweating    History of Present Illness: HPI   58 year old male presents with excessive sweating issues in last month, mainly at night.  no palpitations, no CP, no SOB.  no new meds.   His blood pressure has also been poorly controlled on lisinopril 10 mg daily. At Home 140/90... he has been more anxious lately. BP Readings from Last 3 Encounters:  08/15/19 (!) 160/98  07/25/19 (!) 175/88  07/24/19 (!) 185/90   Has been having more  pain in back lately.   Very constipated with pain meds.. taking fiber which helps.  Has follow up with back doctor.  Mother with hyperthyroidism.  Seeing Dr. Cheryln Manly for  Depression/dbipolar do.  COVID 19 screen No recent travel or known exposure to COVID19 The patient denies respiratory symptoms of COVID 19 at this time.  The importance of social distancing was discussed today.   Review of Systems  Constitutional: Negative for chills and fever.  HENT: Negative for congestion and ear pain.   Eyes: Negative for pain and redness.  Respiratory: Negative for cough and shortness of breath.   Cardiovascular: Negative for chest pain, palpitations and leg swelling.  Gastrointestinal: Positive for constipation. Negative for abdominal pain, blood in stool, diarrhea, nausea and vomiting.  Genitourinary: Negative for dysuria.  Musculoskeletal: Positive for back pain. Negative for falls and myalgias.  Skin: Negative for rash.  Neurological: Negative for dizziness.  Psychiatric/Behavioral: Positive for depression. The patient is not nervous/anxious.       Past Medical History:  Diagnosis Date  . Depression    bipolar  . Diverticulosis   . Hyperlipidemia   . Hypertension   . Mold exposure     reports that he has never smoked. He has never used smokeless tobacco. He reports current drug use. Drug: Marijuana. He reports that he does not drink alcohol.   Current Outpatient Medications:  .   cholecalciferol (VITAMIN D) 1000 units tablet, Take 1,000 Units by mouth daily., Disp: , Rfl:  .  lisinopril (ZESTRIL) 10 MG tablet, Take 1 tablet (10 mg total) by mouth daily., Disp: 90 tablet, Rfl: 3 .  oxycodone (OXY-IR) 5 MG capsule, Take 1 capsule (5 mg total) by mouth daily., Disp: 30 capsule, Rfl: 0 .  pregabalin (LYRICA) 150 MG capsule, Take 1 capsule (150 mg total) by mouth 2 (two) times daily., Disp: 60 capsule, Rfl: 2  Current Facility-Administered Medications:  .  methylPREDNISolone acetate (DEPO-MEDROL) injection 80 mg, 80 mg, Other, Once, Magnus Sinning, MD   Observations/Objective: Blood pressure (!) 160/98, pulse 68, temperature (!) 97.5 F (36.4 C), height 6' 0.6" (1.844 m), weight 222 lb (100.7 kg), SpO2 96 %.  Physical Exam Constitutional:      Appearance: He is well-developed.  HENT:     Head: Normocephalic.     Right Ear: Hearing normal.     Left Ear: Hearing normal.     Nose: Nose normal.  Neck:     Thyroid: No thyroid mass or thyromegaly.     Vascular: No carotid bruit.     Trachea: Trachea normal.  Cardiovascular:     Rate and Rhythm: Normal rate and regular rhythm.     Pulses: Normal pulses.     Heart sounds: Heart sounds not distant. No murmur. No friction rub. No gallop.      Comments: No peripheral edema Pulmonary:     Effort: Pulmonary effort is normal. No respiratory distress.  Breath sounds: Normal breath sounds.  Skin:    General: Skin is warm and dry.     Findings: No rash.  Psychiatric:        Speech: Speech normal.        Behavior: Behavior normal.        Thought Content: Thought content normal.      Assessment and Plan   Excessive sweating Eval for secondary causes with labs eval.  Constipation due to opioid therapy Can use miralax daily for constipation.   Essential hypertension  Increase  Lisinopril to 2 a day, for 20 mg daily.   Chronic pain syndrome Discuss with Dr. Alvester Morin back pain.. consider increasing to lyrica  150 mg three times a day vs add diclofenac daily for inflammation.       Kerby Nora, MD

## 2019-08-16 ENCOUNTER — Other Ambulatory Visit: Payer: Self-pay | Admitting: Family Medicine

## 2019-08-16 LAB — TSH: TSH: 0.52 u[IU]/mL (ref 0.35–4.50)

## 2019-08-16 NOTE — Telephone Encounter (Signed)
Last office visit 08/15/2019 for excessive sweating.  Last refilled 05/03/2019 for #60 with 2 refills.  CPE scheduled for 11/29/2019.

## 2019-08-18 ENCOUNTER — Other Ambulatory Visit: Payer: Self-pay

## 2019-08-18 ENCOUNTER — Telehealth: Payer: Medicare Other

## 2019-08-18 ENCOUNTER — Emergency Department (HOSPITAL_COMMUNITY)
Admission: EM | Admit: 2019-08-18 | Discharge: 2019-08-18 | Disposition: A | Discharge status: Home / Self Care | Payer: Medicare Other | Attending: Emergency Medicine | Admitting: Emergency Medicine

## 2019-08-18 DIAGNOSIS — I10 Essential (primary) hypertension: Secondary | ICD-10-CM | POA: Insufficient documentation

## 2019-08-18 DIAGNOSIS — Z79899 Other long term (current) drug therapy: Secondary | ICD-10-CM | POA: Insufficient documentation

## 2019-08-18 DIAGNOSIS — K625 Hemorrhage of anus and rectum: Secondary | ICD-10-CM | POA: Insufficient documentation

## 2019-08-18 DIAGNOSIS — R42 Dizziness and giddiness: Secondary | ICD-10-CM | POA: Diagnosis present

## 2019-08-18 LAB — CBC
HCT: 37.4 % — ABNORMAL LOW (ref 39.0–52.0)
Hemoglobin: 12.6 g/dL — ABNORMAL LOW (ref 13.0–17.0)
MCH: 32.7 pg (ref 26.0–34.0)
MCHC: 33.7 g/dL (ref 30.0–36.0)
MCV: 97.1 fL (ref 80.0–100.0)
Platelets: 235 10*3/uL (ref 150–400)
RBC: 3.85 MIL/uL — ABNORMAL LOW (ref 4.22–5.81)
RDW: 12.9 % (ref 11.5–15.5)
WBC: 13 10*3/uL — ABNORMAL HIGH (ref 4.0–10.5)
nRBC: 0 % (ref 0.0–0.2)

## 2019-08-18 LAB — URINALYSIS, ROUTINE W REFLEX MICROSCOPIC
Bilirubin Urine: NEGATIVE
Glucose, UA: NEGATIVE mg/dL
Hgb urine dipstick: NEGATIVE
Ketones, ur: NEGATIVE mg/dL
Leukocytes,Ua: NEGATIVE
Nitrite: NEGATIVE
Protein, ur: NEGATIVE mg/dL
Specific Gravity, Urine: 1.01 (ref 1.005–1.030)
pH: 8 (ref 5.0–8.0)

## 2019-08-18 LAB — BASIC METABOLIC PANEL
Anion gap: 10 (ref 5–15)
BUN: 5 mg/dL — ABNORMAL LOW (ref 6–20)
CO2: 26 mmol/L (ref 22–32)
Calcium: 9.2 mg/dL (ref 8.9–10.3)
Chloride: 104 mmol/L (ref 98–111)
Creatinine, Ser: 0.74 mg/dL (ref 0.61–1.24)
GFR calc Af Amer: >60 mL/min (ref >60)
GFR calc non Af Amer: >60 mL/min (ref >60)
Glucose, Bld: 100 mg/dL — ABNORMAL HIGH (ref 70–99)
Potassium: 3.5 mmol/L (ref 3.5–5.1)
Sodium: 140 mmol/L (ref 135–145)

## 2019-08-18 LAB — CBG MONITORING, ED: Glucose-Capillary: 78 mg/dL (ref 70–99)

## 2019-08-18 MED ORDER — SODIUM CHLORIDE 0.9% FLUSH: 3.0000 mL | Freq: Once | INTRAVENOUS | Status: DC

## 2019-08-18 MED ORDER — ONDANSETRON 4 MG PO TBDP
4.0000 mg | ORAL_TABLET | Freq: Once | ORAL | Status: AC
  Administered 2019-08-18: 4 mg via ORAL
  Filled 2019-08-18: qty 1

## 2019-08-18 MED ORDER — ONDANSETRON 4 MG PO TBDP: 4.0000 mg | ORAL_TABLET | Freq: Three times a day (TID) | ORAL | 0 refills | Status: DC | PRN

## 2019-08-18 NOTE — ED Triage Notes (Signed)
Formatting of this note might be different from the original.  Patient here with reports of feeling lightheaded since Friday  Called his PCP and was told to come to the ED  Patient was ambulatory to the facility  Electronically signed by Lowry Ram, RN at 08/18/2019  4:51 PM EST

## 2019-08-18 NOTE — ED Provider Notes (Signed)
Formatting of this note is different from the original.  Images from the original note were not included.    [facility] EMERGENCY DEPARTMENT  Provider Note    CSN: [id number]  Arrival date & time: 08/18/19  1643        History    Chief Complaint  Chief Complaint   Patient presents with   ? Dizziness     HPI  Mitchell Evans is a 58 y.o. male with PMH significant for HTN, [name], and diverticulosis who presents to the ED with a 2-day history of fatigue and diminished appetite.  Today he states that he has been feeling lightheaded, nauseated, and endorses a slight headache.  He also reports blood in his stool and clots on his toilet paper, which is what prompted him to call his PCP who then encouraged him to come to the ED for evaluation.  He has a history of diverticulosis and suspects that this may be a diverticular bleed.  He was tested for COVID-19 approximately 3 weeks ago and was negative.  He denies any fevers or chills, room spinning dizziness, chest pain, shortness of breath, cough, abdominal pain, or urinary symptoms.  He has consumed ginger ale which has helped with his nausea.      HPI    Past Medical History:   Diagnosis Date   ? Depression     bipolar   ? Diverticulosis    ? Hyperlipidemia    ? Hypertension    ? Mold exposure      Patient Active Problem List    Diagnosis Date Noted   ? Advice given about COVID-19 virus infection 03/28/2019   ? Left leg pain 02/28/2019   ? Elevated liver function tests 11/20/2018   ? Chronic pain syndrome 11/20/2018   ? Cervical radiculopathy 09/05/2018   ? Lumbar radiculopathy 08/17/2018   ? Pain in right thigh 03/27/2018   ? Lateral knee pain, right 03/27/2018   ? Marijuana abuse, continuous 11/14/2017   ? Chronic low back pain with sciatica 11/09/2017   ? Osteoarthritis of spine 02/23/2017   ? Chronic right shoulder pain 01/09/2017   ? Essential hypertension 08/17/2016   ? Bipolar disorder (HCC) 08/17/2016   ? Hyperlipidemia 08/17/2016     Past Surgical  History:   Procedure Laterality Date   ? BACK SURGERY     ? CERVICAL SPINE SURGERY  2003    fusion   ? KNEE ARTHROSCOPY Right    ? KNEE ARTHROSCOPY Left    ? SPINE SURGERY         Home Medications      Prior to Admission medications    Medication Sig Start Date End Date Taking? Authorizing Provider   cholecalciferol (VITAMIN D) 1000 units tablet Take 1,000 Units by mouth daily.   Yes [provider]   FIBER PO Take 2 tablets by mouth 3 (three) times daily. gummies   Yes [provider]   lisinopril (ZESTRIL) 10 MG tablet Take 1 tablet (10 mg total) by mouth daily.  Patient taking differently: Take 20 mg by mouth daily.  03/15/19  Yes Bedsole, Amy E, MD   Multiple Vitamins-Minerals (MULTIVITAMIN ADULT PO) Take 2 tablets by mouth daily. gummies   Yes [provider]   oxycodone (OXY-IR) 5 MG capsule Take 1 capsule (5 mg total) by mouth daily.  Patient taking differently: Take 5 mg by mouth as needed for pain.  07/23/19  Yes Excell Seltzer, MD  pregabalin (LYRICA) 150 MG capsule Take 1 capsule (150 mg total) by mouth 3 (three) times daily. 08/16/19  Yes Bedsole, Amy E, MD   ondansetron (ZOFRAN ODT) 4 MG disintegrating tablet Take 1 tablet (4 mg total) by mouth every 8 (eight) hours as needed for nausea or vomiting. 08/18/19   Lorelee New, PA-C     Family History  Family History   Problem Relation Age of Onset   ? Cancer Mother    ? Colon cancer Mother 107     Social History  Social History     Tobacco Use   ? Smoking status: Never Smoker   ? Smokeless tobacco: Never Used   Substance Use Topics   ? Alcohol use: No   ? Drug use: Yes     Types: Marijuana     Comment: Hx marijuana use - none x 2 yrs     Allergies    Patient has no known allergies.    Review of Systems  Review of Systems   All other systems reviewed and are negative.    Physical Exam  Updated Vital Signs  BP (!) 168/91   Pulse 68   Temp 98.3 F (36.8 C) (Oral)   Resp 14   Ht 5' 11 (1.803 m)   Wt 100.7 kg   SpO2 98%    BMI 30.96 kg/m     Physical Exam  Vitals signs and nursing note reviewed. Exam conducted with a chaperone present.   Constitutional:       Appearance: Normal appearance. He is not ill-appearing.   HENT:      Head: Normocephalic and atraumatic.   Eyes:      General: No scleral icterus.     Conjunctiva/sclera: Conjunctivae normal.   Cardiovascular:      Rate and Rhythm: Normal rate and regular rhythm.      Pulses: Normal pulses.      Heart sounds: Normal heart sounds.   Pulmonary:      Effort: Pulmonary effort is normal. No respiratory distress.      Breath sounds: Normal breath sounds.   Abdominal:      General: Abdomen is flat. Bowel sounds are normal. There is no distension.      Palpations: Abdomen is soft. There is no mass.      Tenderness: There is no abdominal tenderness. There is no guarding.   Genitourinary:     Comments: No evidence of external hemorrhoid or fissure.  No bleeding per rectum.  Skin:     General: Skin is dry.   Neurological:      General: No focal deficit present.      Mental Status: He is alert.      GCS: GCS eye subscore is 4. GCS verbal subscore is 5. GCS motor subscore is 6.      Cranial Nerves: No cranial nerve deficit.      Sensory: No sensory deficit.      Motor: No weakness.      Coordination: Coordination normal.      Gait: Gait normal.   Psychiatric:         Mood and Affect: Mood normal.         Behavior: Behavior normal.         Thought Content: Thought content normal.     ED Treatments / Results   Labs  (all labs ordered are listed, but only abnormal results are displayed)  Labs Reviewed   BASIC METABOLIC PANEL - Abnormal;  Notable for the following components:       Result Value    Glucose, Bld 100 (*)     BUN 5 (*)     All other components within normal limits   CBC - Abnormal; Notable for the following components:    WBC 13.0 (*)     RBC 3.85 (*)     Hemoglobin 12.6 (*)     HCT 37.4 (*)     All other components within normal limits   URINALYSIS, ROUTINE W REFLEX MICROSCOPIC    CBG MONITORING, ED     EKG  None    Radiology  No results found.    Procedures  Procedures (including critical care time)    Medications Ordered in ED  Medications   sodium chloride flush (NS) 0.9 % injection 3 mL (has no administration in time range)   ondansetron (ZOFRAN-ODT) disintegrating tablet 4 mg (4 mg Oral Given 08/18/19 2054)     Initial Impression / Assessment and Plan / ED Course   I have reviewed the triage vital signs and the nursing notes.    Pertinent labs & imaging results that were available during my care of the patient were reviewed by me and considered in my medical decision making (see chart for details).            Patient has history and physical exam consistent with a small diverticular bleed.  No evidence of hemorrhoid or fissure.  He has only had 2 episodes of bloody stool.  His lab work was reassuring and demonstrated no evidence of anemia or leukocytosis beyond his baseline.  There were no electrolyte abnormalities that might explain his fatigue.  He attributes it to the election and states that he is particularly stressed and anxious.  He also states that that is likely why his blood pressure is elevated here in the ED.    His last colonoscopy was last in 2018 which demonstrated moderate diverticulosis and internal hemorrhoids.  His bleeding could possibly be related to an internal hemorrhoid, but that too will likely resolve spontaneously.  Instructed him to follow-up with his gastroenterologist for ongoing evaluation and management.  There is no evidence of an acute bleed with associated anemia.    Patient was provided Zofran here in the ED which resolved his nausea.  He is well on exam and complains of no abdominal pain or discomfort.  He does endorse a small headache and states that he believes it is related to his sinus problems.  He has a history of sinus issues.  I encouraged him to use his nasal saline rinses and to try antihistamines.  I recommended Flonase, but states that he  had a bad experience with it and prefers his nasal saline rinses.  No red flag headache signs or symptoms.    Will provide patient with Zofran ODT to have outpatient for his nausea.  Aside from his mildly elevated BP that he attributes to the political race, his vitals are otherwise reassuring and he appears well. All of the evaluation and work-up results were discussed with the patient and any family at bedside. They were provided opportunity to ask any additional questions and have none at this time. They have expressed understanding of verbal discharge instructions as well as return precautions and are agreeable to the plan.     Final Clinical Impressions(s) / ED Diagnoses     Final diagnoses:   Rectal bleed     ED Discharge Orders  Ordered     ondansetron (ZOFRAN ODT) 4 MG disintegrating tablet  Every 8 hours PRN      08/18/19 2131             Elvera Maria  08/18/19 2134      Bethann Berkshire, MD  08/19/19 1003    Electronically signed by Bethann Berkshire, MD at 08/19/2019 10:03 AM EST    Associated attestation - Bethann Berkshire, MD - 08/19/2019 10:03 AM EST  Formatting of this note might be different from the original.  Medical screening examination/treatment/procedure(s) were performed by non-physician practitioner and as supervising physician I was immediately available for consultation/collaboration.

## 2019-08-18 NOTE — ED Provider Notes (Signed)
[facility]   CSN: [id number] Arrival date & time: 08/18/19  1643     History   Chief Complaint Chief Complaint  Patient presents with  . Dizziness    HPI Adrian Cox is a 58 y.o. male with PMH significant for HTN, [name], and diverticulosis who presents to the ED with a 2-day history of fatigue and diminished appetite.  Today he states that he has been feeling lightheaded, nauseated, and endorses a slight headache.  He also reports blood in his stool and clots on his toilet paper, which is what prompted him to call his PCP who then encouraged him to come to the ED for evaluation.  He has a history of diverticulosis and suspects that this may be a diverticular bleed.  He was tested for COVID-19 approximately 3 weeks ago and was negative.  He denies any fevers or chills, room spinning dizziness, chest pain, shortness of breath, cough, abdominal pain, or urinary symptoms.  He has consumed ginger ale which has helped with his nausea.    HPI  Past Medical History:  Diagnosis Date  . Depression    bipolar  . Diverticulosis   . Hyperlipidemia   . Hypertension   . Mold exposure     Patient Active Problem List   Diagnosis Date Noted  . Advice given about COVID-19 virus infection 03/28/2019  . Left leg pain 02/28/2019  . Elevated liver function tests 11/20/2018  . Chronic pain syndrome 11/20/2018  . Cervical radiculopathy 09/05/2018  . Lumbar radiculopathy 08/17/2018  . Pain in right thigh 03/27/2018  . Lateral knee pain, right 03/27/2018  . Marijuana abuse, continuous 11/14/2017  . Chronic low back pain with sciatica 11/09/2017  . Osteoarthritis of spine 02/23/2017  . Chronic right shoulder pain 01/09/2017  . Essential hypertension 08/17/2016  . Bipolar disorder (HCC) 08/17/2016  . Hyperlipidemia 08/17/2016    Past Surgical History:  Procedure Laterality Date  . BACK SURGERY    . CERVICAL SPINE SURGERY  2003   fusion   . KNEE ARTHROSCOPY Right   . KNEE ARTHROSCOPY Left   . SPINE SURGERY          Home Medications    Prior to Admission medications   Medication Sig Start Date End Date Taking? Authorizing Provider  cholecalciferol (VITAMIN D) 1000 units tablet Take 1,000 Units by mouth daily.   Yes [provider]  FIBER PO Take 2 tablets by mouth 3 (three) times daily. gummies   Yes [provider]  lisinopril (ZESTRIL) 10 MG tablet Take 1 tablet (10 mg total) by mouth daily. Patient taking differently: Take 20 mg by mouth daily.  03/15/19  Yes Bedsole, Amy E, MD  Multiple Vitamins-Minerals (MULTIVITAMIN ADULT PO) Take 2 tablets by mouth daily. gummies   Yes [provider]  oxycodone (OXY-IR) 5 MG capsule Take 1 capsule (5 mg total) by mouth daily. Patient taking differently: Take 5 mg by mouth as needed for pain.  07/23/19  Yes Bedsole, Amy E, MD  pregabalin (LYRICA) 150 MG capsule Take 1 capsule (150 mg total) by mouth 3 (three) times daily. 08/16/19  Yes Bedsole, Amy E, MD  ondansetron (ZOFRAN ODT) 4 MG disintegrating tablet Take 1 tablet (4 mg total) by mouth every 8 (eight) hours as needed for nausea or vomiting. 08/18/19   Lorelee NewGreen, Merelin Human L, PA-C    Family History Family History  Problem Relation Age of Onset  . Cancer Mother   . Colon  cancer Mother 80    Social History Social History   Tobacco Use  . Smoking status: Never Smoker  . Smokeless tobacco: Never Used  Substance Use Topics  . Alcohol use: No  . Drug use: Yes    Types: Marijuana    Comment: Hx marijuana use - none x 2 yrs     Allergies   Patient has no known allergies.   Review of Systems Review of Systems  All other systems reviewed and are negative.    Physical Exam Updated Vital Signs BP (!) 168/91   Pulse 68   Temp 98.3 F (36.8 C) (Oral)   Resp 14   Ht 5\' 11"  (1.803 m)   Wt 100.7 kg   SpO2 98%   BMI 30.96 kg/m   Physical Exam Vitals signs and nursing note reviewed. Exam  conducted with a chaperone present.  Constitutional:      Appearance: Normal appearance. He is not ill-appearing.  HENT:     Head: Normocephalic and atraumatic.  Eyes:     General: No scleral icterus.    Conjunctiva/sclera: Conjunctivae normal.  Cardiovascular:     Rate and Rhythm: Normal rate and regular rhythm.     Pulses: Normal pulses.     Heart sounds: Normal heart sounds.  Pulmonary:     Effort: Pulmonary effort is normal. No respiratory distress.     Breath sounds: Normal breath sounds.  Abdominal:     General: Abdomen is flat. Bowel sounds are normal. There is no distension.     Palpations: Abdomen is soft. There is no mass.     Tenderness: There is no abdominal tenderness. There is no guarding.  Genitourinary:    Comments: No evidence of external hemorrhoid or fissure.  No bleeding per rectum. Skin:    General: Skin is dry.  Neurological:     General: No focal deficit present.     Mental Status: He is alert.     GCS: GCS eye subscore is 4. GCS verbal subscore is 5. GCS motor subscore is 6.     Cranial Nerves: No cranial nerve deficit.     Sensory: No sensory deficit.     Motor: No weakness.     Coordination: Coordination normal.     Gait: Gait normal.  Psychiatric:        Mood and Affect: Mood normal.        Behavior: Behavior normal.        Thought Content: Thought content normal.      ED Treatments / Results  Labs (all labs ordered are listed, but only abnormal results are displayed) Labs Reviewed  BASIC METABOLIC PANEL - Abnormal; Notable for the following components:      Result Value   Glucose, Bld 100 (*)    BUN 5 (*)    All other components within normal limits  CBC - Abnormal; Notable for the following components:   WBC 13.0 (*)    RBC 3.85 (*)    Hemoglobin 12.6 (*)    HCT 37.4 (*)    All other components within normal limits  URINALYSIS, ROUTINE W REFLEX MICROSCOPIC  CBG MONITORING, ED    EKG None  Radiology No results found.   Procedures Procedures (including critical care time)  Medications Ordered in ED Medications  sodium chloride flush (NS) 0.9 % injection 3 mL (has no administration in time range)  ondansetron (ZOFRAN-ODT) disintegrating tablet 4 mg (4 mg Oral Given 08/18/19 2054)     Initial  Impression / Assessment and Plan / ED Course  I have reviewed the triage vital signs and the nursing notes.  Pertinent labs & imaging results that were available during my care of the patient were reviewed by me and considered in my medical decision making (see chart for details).        Patient has history and physical exam consistent with a small diverticular bleed.  No evidence of hemorrhoid or fissure.  He has only had 2 episodes of bloody stool.  His lab work was reassuring and demonstrated no evidence of anemia or leukocytosis beyond his baseline.  There were no electrolyte abnormalities that might explain his fatigue.  He attributes it to the election and states that he is particularly stressed and anxious.  He also states that that is likely why his blood pressure is elevated here in the ED.  His last colonoscopy was last in 2018 which demonstrated moderate diverticulosis and internal hemorrhoids.  His bleeding could possibly be related to an internal hemorrhoid, but that too will likely resolve spontaneously.  Instructed him to follow-up with his gastroenterologist for ongoing evaluation and management.  There is no evidence of an acute bleed with associated anemia.  Patient was provided Zofran here in the ED which resolved his nausea.  He is well on exam and complains of no abdominal pain or discomfort.  He does endorse a small headache and states that he believes it is related to his sinus problems.  He has a history of sinus issues.  I encouraged him to use his nasal saline rinses and to try antihistamines.  I recommended Flonase, but states that he had a bad experience with it and prefers his nasal saline  rinses.  No red flag headache signs or symptoms.  Will provide patient with Zofran ODT to have outpatient for his nausea.  Aside from his mildly elevated BP that he attributes to the political race, his vitals are otherwise reassuring and he appears well. All of the evaluation and work-up results were discussed with the patient and any family at bedside. They were provided opportunity to ask any additional questions and have none at this time. They have expressed understanding of verbal discharge instructions as well as return precautions and are agreeable to the plan.    Final Clinical Impressions(s) / ED Diagnoses   Final diagnoses:  Rectal bleed    ED Discharge Orders         Ordered    ondansetron (ZOFRAN ODT) 4 MG disintegrating tablet  Every 8 hours PRN     08/18/19 2131           Lorelee New, PA-C 08/18/19 2134    Bethann Berkshire, MD 08/19/19 1003

## 2019-08-18 NOTE — ED Triage Notes (Signed)
Patient here with reports of feeling "lightheaded" since Friday Called his PCP and was told to come to the ED Patient was ambulatory to the facility

## 2019-08-19 ENCOUNTER — Telehealth: Payer: Medicare Other

## 2019-08-19 ENCOUNTER — Telehealth: Payer: Self-pay | Admitting: *Deleted

## 2019-08-19 ENCOUNTER — Telehealth: Payer: Self-pay

## 2019-08-19 NOTE — Telephone Encounter (Signed)
Noted  

## 2019-08-19 NOTE — Telephone Encounter (Signed)
Patient called stating that he went to the ER last night and they gave him Zofran. Patient stated that he just found out that his PCP will need to get a PA on the medication. Patient stated that without a PA the medication is going to cost him $120.for 20 pills.

## 2019-08-19 NOTE — Telephone Encounter (Signed)
I did go ahead and complete a PA on CoverMyMeds.  It was sent for review and can take up to 72 hours for a decision.

## 2019-08-19 NOTE — Telephone Encounter (Signed)
Per chart review tab pt went to [facility] ED on 08/18/19.

## 2019-08-19 NOTE — Telephone Encounter (Signed)
Spoke to [place] pharmacist at [facility] and was advised that since Dr. Diona Browner did not prescribe the Zofran she will have to send in a new script and at that point they can then send the PA paperwork request back to her.

## 2019-08-19 NOTE — Telephone Encounter (Signed)
[facility] Night - Client TELEPHONE ADVICE RECORD [facility] Patient Name: Adrian Cox Gender: Male DOB: 01/21/1961 Age: 58 Y 10 M 1 D Return Phone Number: [phone] (Primary), [phone] (Secondary) Address: City/State/Zip: [place] [address] Client [name] Night - Client Client Site [place] Physician Eliezer Lofts - MD Contact Type Call Who Is Calling Patient / Member / Family / Caregiver Call Type Triage / Clinical Relationship To Patient Self Return Phone Number [phone] (Primary) Chief Complaint Blood In Stool Reason for Call Symptomatic / Request for Adrian Cox states he is having a some nausea and blood in stool. Translation No Nurse Assessment Nurse: Waymon Budge, RN, Vaughan Basta Date/Time (Eastern Time): 08/18/2019 3:51:23 PM Confirm and document reason for call. If symptomatic, describe symptoms. ---Caller states he is having a some nausea and blood in stool. Reports he feels weak. Felt bad Friday. Yesterday blood in his stool several times. Blood in toilet after going. No fever. Has the patient had close contact with a person known or suspected to have the novel coronavirus illness OR traveled / lives in area with major community spread (including international travel) in the last 14 days from the onset of symptoms? * If Asymptomatic, screen for exposure and travel within the last 14 days. ---No Does the patient have any new or worsening symptoms? ---Yes Will a triage be completed? ---Yes Related visit to physician within the last 2 weeks? ---No Does the PT have any chronic conditions? (i.e. diabetes, asthma, this includes High risk factors for pregnancy, etc.) ---Yes List chronic conditions. ---degenerative spine, HTN Is this a behavioral health or substance abuse call? ---No Guidelines Guideline Title Affirmed Question Affirmed Notes  Nurse Date/Time (Eastern Time) Rectal Bleeding [1] MODERATE rectal bleeding (small blood clots, passing blood without stool, or toilet water turns red) AND [2] more than once a day Gomez Cleverly 08/18/2019 3:54:49 PM PLEASE NOTE: All timestamps contained within this report are represented as [place] Standard Time. CONFIDENTIALTY NOTICE: This fax transmission is intended only for the addressee. It contains information that is legally privileged, confidential or otherwise protected from use or disclosure. If you are not the intended recipient, you are strictly prohibited from reviewing, disclosing, copying using or disseminating any of this information or taking any action in reliance on or regarding this information. If you have received this fax in error, please notify us immediately by telephone so that we can arrange for its return to [place]. Phone: [phone], Toll-Free: [phone], Fax: [phone] Page: 2 of 2 Call Id: [id number] [place]. Time Eilene Ghazi Time) Disposition Final User 08/18/2019 3:56:43 PM Go to ED Now Yes Waymon Budge, RN, Phineas Semen Disagree/Comply Comply Caller Understands Yes PreDisposition InappropriateToAsk Care Advice Given Per Guideline GO TO ED NOW: BRING MEDICINES: DRIVING: Another adult should drive. Do not delay going to the Emergency Department. If immediate transportation is not available via car or taxi, then the patient should be instructed to call [id number]. CARE ADVICE given per Rectal Bleeding (Adult) guideline. Referrals [facility] - ED

## 2019-08-20 ENCOUNTER — Ambulatory Visit: Payer: Medicare Other | Admitting: Psychology

## 2019-08-21 ENCOUNTER — Other Ambulatory Visit: Payer: Self-pay

## 2019-08-21 ENCOUNTER — Ambulatory Visit: Payer: Self-pay

## 2019-08-21 ENCOUNTER — Encounter: Payer: Self-pay | Admitting: Physical Medicine and Rehabilitation

## 2019-08-21 ENCOUNTER — Ambulatory Visit (INDEPENDENT_AMBULATORY_CARE_PROVIDER_SITE_OTHER): Payer: Medicare Other | Admitting: Physical Medicine and Rehabilitation

## 2019-08-21 VITALS — BP 138/78 | HR 85

## 2019-08-21 DIAGNOSIS — M542 Cervicalgia: Secondary | ICD-10-CM

## 2019-08-21 DIAGNOSIS — M47812 Spondylosis without myelopathy or radiculopathy, cervical region: Secondary | ICD-10-CM

## 2019-08-21 DIAGNOSIS — M5412 Radiculopathy, cervical region: Secondary | ICD-10-CM | POA: Diagnosis not present

## 2019-08-21 DIAGNOSIS — M961 Postlaminectomy syndrome, not elsewhere classified: Secondary | ICD-10-CM

## 2019-08-21 MED ORDER — METHYLPREDNISOLONE ACETATE 80 MG/ML IJ SUSP
80.0000 mg | Freq: Once | INTRAMUSCULAR | Status: AC
  Administered 2019-08-21: 80 mg

## 2019-08-21 NOTE — Telephone Encounter (Signed)
Left message for Mr. Modica that his insurance denied the PA for the Zofran.  I ask that he call us back if he is still having issues with nausea and Dr. Diona Browner can send him in a prescription for phenergan.

## 2019-08-21 NOTE — Telephone Encounter (Signed)
Noted.. let pt know.. if still nausea.. I can send in phenergan

## 2019-08-21 NOTE — Progress Notes (Signed)
 .  Numeric Pain Rating Scale and Functional Assessment Average Pain 8   In the last MONTH (on 0-10 scale) has pain interfered with the following?  1. General activity like being  able to carry out your everyday physical activities such as walking, climbing stairs, carrying groceries, or moving a chair?  Rating(8)   +Driver, -BT, -Dye Allergies.  

## 2019-08-21 NOTE — Telephone Encounter (Signed)
PA was denied.  It states ondansetron for treatment of nausea caused by dizziness is not a FDA labeled or medically accepted use.

## 2019-08-22 ENCOUNTER — Ambulatory Visit (INDEPENDENT_AMBULATORY_CARE_PROVIDER_SITE_OTHER): Payer: Medicare Other | Admitting: Physician Assistant

## 2019-08-22 ENCOUNTER — Encounter: Payer: Self-pay | Admitting: Physician Assistant

## 2019-08-22 VITALS — BP 120/70 | HR 84 | Temp 98.5°F | Ht 71.5 in | Wt 222.3 lb

## 2019-08-22 DIAGNOSIS — K625 Hemorrhage of anus and rectum: Secondary | ICD-10-CM | POA: Diagnosis not present

## 2019-08-22 DIAGNOSIS — K5903 Drug induced constipation: Secondary | ICD-10-CM

## 2019-08-22 NOTE — Patient Instructions (Signed)
Start Benefiber daily in glass of water.  Increase your water to 60 oz daily.  Continue Colace 2 capsules at bedtime.  You have been given a high fiber diet and constipation handout.    Call our office back if you have any recurrent rectal bleeding.

## 2019-08-22 NOTE — Progress Notes (Signed)
Gi p

## 2019-08-22 NOTE — Progress Notes (Signed)
Addendum: Reviewed and agree with assessment and management plan. Naoki Migliaccio M, MD  

## 2019-08-22 NOTE — Progress Notes (Signed)
Subjective:    Patient ID: Adrian Cox, male    DOB: [birth year], 59 y.o.   MRN: [id number]  HPI Adrian Cox is a pleasant 58 year old African-American male, established with Dr. Rhea Belton, who comes in today after recent ER visit on 08/18/2019. Patient has history of hypertension, bipolar disorder, chronic marijuana use, and lumbar and cervical radiculopathy. He had been seen here in October 2018 for colonoscopy due to family history of colorectal cancer.  He was noted to have scattered erosions in the terminal ileum, multiple diverticuli.  There were no polyps and also noted to have small internal hemorrhoids. Patient says he is currently feeling much better since the ER visit.  He says he went because he was having abdominal discomfort and nausea, as well as lightheadedness.  He says he was actually worried about Covid, was tested and this was negative.  He mentioned during that visit that he had also seen a small amount of bright red blood on the tissue and with a bowel movement. Since the ER visit he has been taking stool softeners on a regular basis, says he is working on significantly improving his diet, has increased fiber intake and has not had any abdominal discomfort or nausea since then.  He says he had been constipated at the time he had seen the blood and had passed hard stool.  He has no complaints of anal rectal discomfort.  He said he had been doing quite a bit of straining as well.  He says he had only seen a small amount of blood on the tissue and streaked with a bowel movement.  He does take oxycodone chronically for back pain. Labs in the ER show WBC of 13, hemoglobin 12.6 hematocrit of 37.4.  Review of Systems Pertinent positive and negative review of systems were noted in the above HPI section.  All other review of systems was otherwise negative.  Outpatient Encounter Medications as of 08/22/2019  Medication Sig   cholecalciferol (VITAMIN D) 1000 units tablet Take 1,000 Units by  mouth daily.   FIBER PO Take 2 tablets by mouth 3 (three) times daily. gummies   lisinopril (ZESTRIL) 10 MG tablet Take 1 tablet (10 mg total) by mouth daily. (Patient taking differently: Take 20 mg by mouth daily. )   Multiple Vitamins-Minerals (MULTIVITAMIN ADULT PO) Take 2 tablets by mouth daily. gummies   ondansetron (ZOFRAN ODT) 4 MG disintegrating tablet Take 1 tablet (4 mg total) by mouth every 8 (eight) hours as needed for nausea or vomiting.   oxycodone (OXY-IR) 5 MG capsule Take 1 capsule (5 mg total) by mouth daily. (Patient taking differently: Take 5 mg by mouth as needed for pain. )   pregabalin (LYRICA) 150 MG capsule Take 1 capsule (150 mg total) by mouth 3 (three) times daily.   Facility-Administered Encounter Medications as of 08/22/2019  Medication   methylPREDNISolone acetate (DEPO-MEDROL) injection 80 mg   No Known Allergies Patient Active Problem List   Diagnosis Date Noted   Advice given about COVID-19 virus infection 03/28/2019   Left leg pain 02/28/2019   Elevated liver function tests 11/20/2018   Chronic pain syndrome 11/20/2018   Cervical radiculopathy 09/05/2018   Lumbar radiculopathy 08/17/2018   Pain in right thigh 03/27/2018   Lateral knee pain, right 03/27/2018   Marijuana abuse, continuous 11/14/2017   Chronic low back pain with sciatica 11/09/2017   Osteoarthritis of spine 02/23/2017   Chronic right shoulder pain 01/09/2017   Essential hypertension 08/17/2016   Bipolar disorder (  Adrian Cox) 08/17/2016   Hyperlipidemia 08/17/2016   Social History   Socioeconomic History   Marital status: Married    Spouse name: Not on file   Number of children: 0   Years of education: Not on file   Highest education level: Not on file  Occupational History   Occupation: retired  Scientist, product/process development strain: Not on file   Food insecurity    Worry: Not on file    Inability: Not on Lexicographer needs    Medical:  Not on file    Non-medical: Not on file  Tobacco Use   Smoking status: Never Smoker   Smokeless tobacco: Never Used  Substance and Sexual Activity   Alcohol use: No   Drug use: Yes    Types: Marijuana    Comment: Hx marijuana use - none x 2 yrs   Sexual activity: Yes  Lifestyle   Physical activity    Days per week: Not on file    Minutes per session: Not on file   Stress: Not on file  Relationships   Social connections    Talks on phone: Not on file    Gets together: Not on file    Attends religious service: Not on file    Active member of club or organization: Not on file    Attends meetings of clubs or organizations: Not on file    Relationship status: Not on file   Intimate partner violence    Fear of current or ex partner: Not on file    Emotionally abused: Not on file    Physically abused: Not on file    Forced sexual activity: Not on file  Other Topics Concern   Not on file  Social History Narrative   Patient is married, has no children. He does not smoke drink or use any substances. He has a high school education. He is currently not employed. He does some walking, but really does not get a lot of regular exercise. He has membership at [employer] but he doesn't use it    Adrian Cox's family history includes Alzheimer's disease in his father; Colon cancer (age of onset: 2) in his mother; Diabetes in his maternal grandmother, sister, and sister; Hypertension in his father, maternal grandmother, mother, sister, and sister.      Objective:    Vitals:   08/22/19 0927  BP: 120/70  Pulse: 84  Temp: 98.5 F (36.9 C)    Physical Exam Well-developed well-nourished African-American male in no acute distress.  Height, Weight 222, BMI 30.5  HEENT; nontraumatic normocephalic, EOMI, PER R LA, sclera anicteric. Oropharynx; not examined/mask/Covid Neck; supple, no JVD Cardiovascular; regular rate and rhythm with S1-S2, no murmur rub or gallop Pulmonary; Clear  bilaterally Abdomen; soft, nontender, nondistended, no palpable mass or hepatosplenomegaly, bowel sounds are active Rectal; not done today negative exam at time of recent ER visit 08/18/2019 Skin; benign exam, no jaundice rash or appreciable lesions Extremities; no clubbing cyanosis or edema skin warm and dry Neuro/Psych; alert and oriented x4, grossly nonfocal mood and affect appropriate       Assessment & Plan:   #66 58 year old African-American male with recent ER visit on 08/18/2019 with multiple complaints at that time including dizziness, headache nausea and some lightheadedness.  In addition he had mentioned that he had noted a small amount of bright red blood with a bowel movement. Patient's GI symptoms have resolved, he says he was constipated and thinks that  what was causing his abdominal discomfort.  He had also been straining quite a bit when he noticed the small-volume hematochezia.  That has not recurred.  Patient has previously documented small internal hemorrhoids.  Suspect bleeding was secondary to internal hemorrhoids in setting of constipation.  #2 diverticulosis 3.  Family history of colon cancer-up-to-date with screening last colonoscopy October 2018 no polyps due for follow-up October 2023. 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  Bipolar disorder 6.  Hypertension  Plan;We discussed management of constipation with high-fiber diet, he was given a copy. Also advised he start Benefiber 1 scoop daily in a glass of water, and increase water intake to about 60 ounces per day. Continue Colace 1-2 at bedtime daily. I do not think patient needs repeat colonoscopy at this time, he was advised should he have recurrent problems with rectal bleeding to call and at that time would consider repeat colonoscopy with Dr. Rhea BeltonPyrtle. We will follow up as needed.  Jeliyah Middlebrooks S Lilas Diefendorf PA-C 08/22/2019   Cc: Excell SeltzerBedsole, Yerachmiel Spinney E, MD

## 2019-08-26 ENCOUNTER — Encounter: Payer: Self-pay | Admitting: Physical Medicine and Rehabilitation

## 2019-08-27 ENCOUNTER — Encounter: Payer: Self-pay | Admitting: Physical Medicine and Rehabilitation

## 2019-08-27 ENCOUNTER — Ambulatory Visit (INDEPENDENT_AMBULATORY_CARE_PROVIDER_SITE_OTHER): Payer: Medicare Other | Admitting: Psychology

## 2019-08-27 DIAGNOSIS — F411 Generalized anxiety disorder: Secondary | ICD-10-CM | POA: Diagnosis not present

## 2019-08-27 NOTE — Progress Notes (Signed)
Adrian Cox - 58 y.o. male MRN [id number]  Date of birth: 1961/01/17  Office Visit Note: Visit Date: 07/24/2019 PCP: Excell Seltzer, MD Referred by: Excell Seltzer, MD  Subjective: Chief Complaint  Patient presents with  . Neck - Pain  . Right Shoulder - Pain   HPI: Adrian Cox is a 58 y.o. male who comes in today For reevaluation and management of right-sided cervicalgia.  We have had the MRI of the cervical spine for a while just have not been able to get the patient in for review.  He continues to have 8 out of 10 constant sharp and aching pain worse with right-sided rotation.  This pain is located in the mid to upper cervical region.  No real referral pattern to the shoulder or arm or shoulder blade.  Nothing in the hands bilaterally.  No left-sided complaints.  No associated headache.  Patient's history is difficult with prior lumbar fusion at C6-7.  MRI reviewed with the patient today using spine models and imaging.  He has pretty significant facet arthritis with edema on the right at C2-3 which seems to be located in the area where he points but it could be little bit higher than where he points.  He also has adjacent level disease above the fusion with at least moderate central canal stenosis quite tight at that level.  Prior surgery was done by Dr. Coletta Memos.  Case complicated somewhat by bipolar disorder as well as marijuana use.  Patient her pain contract primary care physician for small amount of opioid and Lyrica.  Review of Systems  Constitutional: Negative for chills, fever, malaise/fatigue and weight loss.  HENT: Negative for hearing loss and sinus pain.   Eyes: Negative for blurred vision, double vision and photophobia.  Respiratory: Negative for cough and shortness of breath.   Cardiovascular: Negative for chest pain, palpitations and leg swelling.  Gastrointestinal: Negative for abdominal pain, nausea and vomiting.  Genitourinary: Negative for flank pain.   Musculoskeletal: Positive for back pain and neck pain. Negative for myalgias.  Skin: Negative for itching and rash.  Neurological: Negative for tremors, focal weakness and weakness.  Endo/Heme/Allergies: Negative.   Psychiatric/Behavioral: Negative for depression.  All other systems reviewed and are negative.  Otherwise per HPI.  Assessment & Plan: Visit Diagnoses:  1. Cervicalgia   2. Cervical spondylosis without myelopathy   3. Post laminectomy syndrome   4. Myofascial pain syndrome     Plan: Findings:  Severe right-sided neck pain mostly axial worse with rotation consistent with facet arthritis and I do feel like the C2-3 joint is probably the symptomatic joint.  We will complete diagnostic medial branch block/facet joint block at that level using fluoroscopic guidance.  Prior use a side-lying approach.  Patient with prior surgical history and fusion.  Does have adjacent level disease from a canal standpoint but does not seem to have symptoms totally consistent with that.  If the injection at facet joint is not beneficial could look at one-time epidural injection.  Likely would need to regroup with physical therapy and consider spine surgery evaluation for cervical spine.    Meds & Orders: No orders of the defined types were placed in this encounter.  No orders of the defined types were placed in this encounter.   Follow-up: Return for Right C2-3 facet joint block.   Procedures: No procedures performed  No notes on file   Clinical History: MRI CERVICAL SPINE WITHOUT CONTRAST  TECHNIQUE: Multiplanar, multisequence  MR imaging of the cervical spine was performed. No intravenous contrast was administered.  COMPARISON:  Cervical spine radiographs 02/23/2017. Cervical spine CT 06/29/2016.  FINDINGS: Alignment: Stable since 2018 with straightening and mild reversal of cervical lordosis. Chronic mild retrolisthesis of C3 on C4.  Vertebrae: Chronic C6-C7 ACDF with evidence of  solid arthrodesis. Mild associated susceptibility artifact. Right side confluent marrow edema in chronically degenerated right C2 and C3 facets (series 7, image 2). Up to mild surrounding soft tissue inflammation. Bone marrow signal elsewhere appears normal. No other No acute osseous abnormality identified.  Cord: No cervical spinal cord signal abnormality despite multilevel mass effect on the cord, see below.  Posterior Fossa, vertebral arteries, paraspinal tissues: Cervicomedullary junction is within normal limits. Negative visible posterior fossa. Partially empty sella. Major vascular flow voids in the neck are preserved. Negative visible neck soft tissues aside from those adjacent to the right C2-C3 facets.  Disc levels:  C2-C3: Circumferential disc bulge and endplate spurring is broad-based and eccentric to the left posteriorly. See series 9, image 6. Mild spinal stenosis. Mild if any cord mass effect. Superimposed facet hypertrophy which is moderate on the right. Associated right side facet edema. Mild left and moderate right C3 foraminal stenosis.  C3-C4: Mild chronic retrolisthesis with disc space loss and circumferential disc osteophyte complex. Broad-based left paracentral component (series 9, image 10). Spinal stenosis with mild to moderate spinal cord mass effect. Severe bilateral C4 foraminal stenosis.  C4-C5: Disc space loss with circumferential disc osteophyte complex. Broad-based right eccentric posterior component (series 9, image 15). Spinal stenosis with mild to moderate spinal cord mass effect. Severe bilateral C5 foraminal stenosis.  C5-C6: Disc space loss with circumferential disc osteophyte complex. Bulky and broad-based right paracentral component (series 8, image 20). Mild posterior element hypertrophy. Spinal stenosis with mild spinal cord mass effect. Severe left and moderate to severe right C6 foraminal stenosis.  C6-C7:  Prior ACDF with  solid arthrodesis and no stenosis.  C7-T1: Disc space loss with circumferential disc osteophyte complex, bulky left far lateral component and broad-based posterior component. Mild posterior element hypertrophy. Mild spinal stenosis, up to mild spinal cord mass effect. Severe left and mild to moderate right C8 foraminal stenosis.  Mild T1-T2 disc bulging without significant upper thoracic spinal stenosis.  IMPRESSION: 1. 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 Marrow edema there is superimposed on chronic disc and posterior element degeneration which results in mild spinal stenosis and up to moderate right C3 foraminal stenosis. 2. Chronic C6-C7 ACDF with solid arthrodesis. 3. Advanced disc and endplate degeneration C3-C4 through C5-C6 with multifactorial spinal stenosis at each level. Up to moderate associated spinal cord mass effect, but no cord signal abnormality. Severe bilateral C4, C5 and left greater than right C6 foraminal stenosis. 4. C7-T1 degeneration with mild spinal stenosis, severe left C8 foraminal stenosis.   Electronically Signed   By: Odessa Fleming M.D.   On: 07/21/2019 08:33   He reports that he has never smoked. He has never used smokeless tobacco. No results for input(s): HGBA1C, LABURIC in the last 8760 hours.  Objective:  VS:  HT:    WT:   BMI:     BP:(!) 185/90  HR:89bpm  TEMP: ( )  RESP:  Physical Exam Vitals signs and nursing note reviewed.  Constitutional:      General: He is not in acute distress.    Appearance: Normal appearance. He is well-developed.  HENT:     Head: Normocephalic and atraumatic.     Nose: Nose normal.     Mouth/Throat:     Mouth: Mucous membranes are moist.     Pharynx: Oropharynx is clear.  Eyes:     Conjunctiva/sclera: Conjunctivae normal.     Pupils: Pupils are equal, round, and reactive to light.  Neck:     Musculoskeletal: Neck  supple. Muscular tenderness present. No neck rigidity.     Trachea: No tracheal deviation.  Cardiovascular:     Rate and Rhythm: Normal rate and regular rhythm.     Pulses: Normal pulses.     Heart sounds: Normal heart sounds.  Pulmonary:     Effort: Pulmonary effort is normal. No respiratory distress.     Breath sounds: Normal breath sounds.  Abdominal:     General: There is no distension.     Palpations: Abdomen is soft.     Tenderness: There is no guarding or rebound.  Musculoskeletal:        General: No deformity.     Right lower leg: No edema.     Left lower leg: No edema.     Comments: Patient with pain with right-sided rotation more than left with some decreased range of motion to the right than the left.  Some pain with extension of the lumbar spine some pain with forward flexion.  Does have some focal trigger points in the trapezius and levator scapular area but this does not reproduce his pain.  No shoulder impingement to a great deal.  Has good strength in the upper extremities and a negative Hoffmann's test bilaterally.  Lymphadenopathy:     Cervical: No cervical adenopathy.  Skin:    General: Skin is warm and dry.     Findings: No erythema or rash.  Neurological:     General: No focal deficit present.     Mental Status: He is alert and oriented to person, place, and time.     Sensory: No sensory deficit.     Motor: No abnormal muscle tone.     Coordination: Coordination normal.     Gait: Gait normal.  Psychiatric:        Mood and Affect: Mood normal.        Behavior: Behavior normal.        Thought Content: Thought content normal.     Ortho Exam Imaging: No results found.  Past Medical/Family/Surgical/Social History: Medications & Allergies reviewed per EMR, new medications updated. Patient Active Problem List   Diagnosis Date Noted  . Advice given about COVID-19 virus infection 03/28/2019  . Left leg pain 02/28/2019  . Elevated liver function tests  11/20/2018  . Chronic pain syndrome 11/20/2018  . Cervical radiculopathy 09/05/2018  . Lumbar radiculopathy 08/17/2018  . Pain in right thigh 03/27/2018  . Lateral knee pain, right 03/27/2018  . Marijuana abuse, continuous 11/14/2017  . Chronic low back pain with sciatica 11/09/2017  . Osteoarthritis of spine 02/23/2017  . Chronic right shoulder pain 01/09/2017  . Essential hypertension 08/17/2016  . Bipolar disorder (HCC) 08/17/2016  . Hyperlipidemia 08/17/2016   Past Medical History:  Diagnosis Date  . Depression    bipolar  . Diverticulosis   . Hyperlipidemia   . Hypertension   . Mold exposure   . Sciatic nerve pain    Family History  Problem Relation Age of Onset  . Colon cancer Mother 3060  . Hypertension Mother   . Alzheimer's disease Father   . Hypertension  Father   . Hypertension Sister   . Diabetes Sister   . Diabetes Maternal Grandmother   . Hypertension Maternal Grandmother   . Hypertension Sister   . Diabetes Sister    Past Surgical History:  Procedure Laterality Date  . BACK SURGERY    . CERVICAL SPINE SURGERY  2003   fusion  . KNEE ARTHROSCOPY Right   . KNEE ARTHROSCOPY Left   . SPINE SURGERY     Social History   Occupational History  . Occupation: retired  Tobacco Use  . Smoking status: Never Smoker  . Smokeless tobacco: Never Used  Substance and Sexual Activity  . Alcohol use: No  . Drug use: Yes    Types: Marijuana    Comment: Hx marijuana use - none x 2 yrs  . Sexual activity: Yes

## 2019-08-27 NOTE — Procedures (Signed)
Cervical Facet Joint Intra-Articular Injection with Fluoroscopic Guidance  [name]      Date of Birth: 16-Jun-1961 MRN: [id number] PCP: Jinny Sanders, MD      Visit Date: 07/25/2019   Universal Protocol:    Date/Time: 11/10/206:16 AM  Consent Given By: the patient  Position: lateral  Additional Comments: Vital signs were monitored before and after the procedure. Patient was prepped and draped in the usual sterile fashion. The correct patient, procedure, and site was verified.   Injection Procedure Details:  Procedure Site One Meds Administered:  Meds ordered this encounter  Medications  . methylPREDNISolone acetate (DEPO-MEDROL) injection 80 mg     Laterality: Right  Location/Site:  C2-3  Needle size: 25 G  Needle type: Standard  Needle Placement: Articular  Findings:  -Contrast Used: 0.5 mL iohexol 180 mg iodine/mL   -Comments: Excellent flow of contrast producing a partial arthrogram.  Procedure Details: The fluoroscope beam was manipulated to achieve the best "true" lateral view possible by squaring off the endplates with cranial and caudal tilt and using varying obliquity to achieve the a view with the longest length of spinous process.   The region overlying the facet joints mentioned above were then localized under fluoroscopic visualization. For each target described below the skin was anesthetized with 1 ml of 1% Lidocaine without epinephrine. The needle was inserted down to the level of the lateral mass of the superior articular process of the  facet joint to be injected. Then, the needle was "walked off" inferiorly into the lateral aspect of the facet joint. Bi-planar images were used for confirming placement and spot radiographs were documented. Radiographs were obtained of the arthrogram. A 0.5 ml. volume of the steroid/anesthetic solution was injected into the joint. This procedure was repeated for each facet joint injected.   Additional  Comments:  The patient tolerated the procedure well Dressing: Band-Aid    Post-procedure details: Patient was observed during the procedure. Post-procedure instructions were reviewed.  Patient left the clinic in stable condition.

## 2019-08-27 NOTE — Progress Notes (Signed)
Adrian Cox - 58 y.o. male MRN [id number]  Date of birth: 30-Apr-1961  Office Visit Note: Visit Date: 07/25/2019 PCP: Excell Seltzer, MD Referred by: Excell Seltzer, MD  Subjective: No chief complaint on file.  HPI:  Adrian Cox is a 58 y.o. male who comes in today For planned right C2-3 facet joint block.  Please see our prior evaluation and management note for further details and justification.  ROS Otherwise per HPI.  Assessment & Plan: Visit Diagnoses:  1. Cervical spondylosis without myelopathy   2. Cervicalgia     Plan: No additional findings.   Meds & Orders:  Meds ordered this encounter  Medications  . methylPREDNISolone acetate (DEPO-MEDROL) injection 80 mg    Orders Placed This Encounter  Procedures  . Facet Injection  . XR C-ARM NO REPORT    Follow-up: Return if symptoms worsen or fail to improve.   Procedures: No procedures performed  Cervical Facet Joint Intra-Articular Injection with Fluoroscopic Guidance  [name]      Date of Birth: July 12, 1961 MRN: [id number] PCP: Excell Seltzer, MD      Visit Date: 07/25/2019   Universal Protocol:    Date/Time: 11/10/206:16 AM  Consent Given By: the patient  Position: lateral  Additional Comments: Vital signs were monitored before and after the procedure. Patient was prepped and draped in the usual sterile fashion. The correct patient, procedure, and site was verified.   Injection Procedure Details:  Procedure Site One Meds Administered:  Meds ordered this encounter  Medications  . methylPREDNISolone acetate (DEPO-MEDROL) injection 80 mg     Laterality: Right  Location/Site:  C2-3  Needle size: 25 G  Needle type: Standard  Needle Placement: Articular  Findings:  -Contrast Used: 0.5 mL iohexol 180 mg iodine/mL   -Comments: Excellent flow of contrast producing a partial arthrogram.  Procedure Details: The fluoroscope beam was manipulated to achieve the best "true"  lateral view possible by squaring off the endplates with cranial and caudal tilt and using varying obliquity to achieve the a view with the longest length of spinous process.   The region overlying the facet joints mentioned above were then localized under fluoroscopic visualization. For each target described below the skin was anesthetized with 1 ml of 1% Lidocaine without epinephrine. The needle was inserted down to the level of the lateral mass of the superior articular process of the  facet joint to be injected. Then, the needle was "walked off" inferiorly into the lateral aspect of the facet joint. Bi-planar images were used for confirming placement and spot radiographs were documented. Radiographs were obtained of the arthrogram. A 0.5 ml. volume of the steroid/anesthetic solution was injected into the joint. This procedure was repeated for each facet joint injected.   Additional Comments:  The patient tolerated the procedure well Dressing: Band-Aid    Post-procedure details: Patient was observed during the procedure. Post-procedure instructions were reviewed.  Patient left the clinic in stable condition.    Clinical History: MRI CERVICAL SPINE WITHOUT CONTRAST  TECHNIQUE: Multiplanar, multisequence MR imaging of the cervical spine was performed. No intravenous contrast was administered.  COMPARISON:  Cervical spine radiographs 02/23/2017. Cervical spine CT 06/29/2016.  FINDINGS: Alignment: Stable since 2018 with straightening and mild reversal of cervical lordosis. Chronic mild retrolisthesis of C3 on C4.  Vertebrae: Chronic C6-C7 ACDF with evidence of solid arthrodesis. Mild associated susceptibility artifact. Right side confluent marrow edema in chronically degenerated right C2 and C3 facets (series 7,  image 2). Up to mild surrounding soft tissue inflammation. Bone marrow signal elsewhere appears normal. No other No acute osseous abnormality identified.  Cord: No  cervical spinal cord signal abnormality despite multilevel mass effect on the cord, see below.  Posterior Fossa, vertebral arteries, paraspinal tissues: Cervicomedullary junction is within normal limits. Negative visible posterior fossa. Partially empty sella. Major vascular flow voids in the neck are preserved. Negative visible neck soft tissues aside from those adjacent to the right C2-C3 facets.  Disc levels:  C2-C3: Circumferential disc bulge and endplate spurring is broad-based and eccentric to the left posteriorly. See series 9, image 6. Mild spinal stenosis. Mild if any cord mass effect. Superimposed facet hypertrophy which is moderate on the right. Associated right side facet edema. Mild left and moderate right C3 foraminal stenosis.  C3-C4: Mild chronic retrolisthesis with disc space loss and circumferential disc osteophyte complex. Broad-based left paracentral component (series 9, image 10). Spinal stenosis with mild to moderate spinal cord mass effect. Severe bilateral C4 foraminal stenosis.  C4-C5: Disc space loss with circumferential disc osteophyte complex. Broad-based right eccentric posterior component (series 9, image 15). Spinal stenosis with mild to moderate spinal cord mass effect. Severe bilateral C5 foraminal stenosis.  C5-C6: Disc space loss with circumferential disc osteophyte complex. Bulky and broad-based right paracentral component (series 8, image 20). Mild posterior element hypertrophy. Spinal stenosis with mild spinal cord mass effect. Severe left and moderate to severe right C6 foraminal stenosis.  C6-C7:  Prior ACDF with solid arthrodesis and no stenosis.  C7-T1: Disc space loss with circumferential disc osteophyte complex, bulky left far lateral component and broad-based posterior component. Mild posterior element hypertrophy. Mild spinal stenosis, up to mild spinal cord mass effect. Severe left and mild to moderate right C8 foraminal  stenosis.  Mild T1-T2 disc bulging without significant upper thoracic spinal stenosis.  IMPRESSION: 1. 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 Marrow edema there is superimposed on chronic disc and posterior element degeneration which results in mild spinal stenosis and up to moderate right C3 foraminal stenosis. 2. Chronic C6-C7 ACDF with solid arthrodesis. 3. Advanced disc and endplate degeneration N8-M7 through C5-C6 with multifactorial spinal stenosis at each level. Up to moderate associated spinal cord mass effect, but no cord signal abnormality. Severe bilateral C4, C5 and left greater than right C6 foraminal stenosis. 4. C7-T1 degeneration with mild spinal stenosis, severe left C8 foraminal stenosis.   Electronically Signed   By: Genevie Ann M.D.   On: 07/21/2019 08:33     Objective:  VS:  HT:    WT:   BMI:     BP:(!) 175/88  HR:96bpm  TEMP: ( )  RESP:  Physical Exam  Ortho Exam Imaging: No results found.

## 2019-09-03 ENCOUNTER — Ambulatory Visit (INDEPENDENT_AMBULATORY_CARE_PROVIDER_SITE_OTHER): Payer: Medicare Other | Admitting: Psychology

## 2019-09-03 DIAGNOSIS — F411 Generalized anxiety disorder: Secondary | ICD-10-CM | POA: Diagnosis not present

## 2019-09-10 ENCOUNTER — Ambulatory Visit: Payer: Medicare Other | Admitting: Psychology

## 2019-09-17 ENCOUNTER — Ambulatory Visit: Payer: Medicare Other | Admitting: Psychology

## 2019-09-20 DIAGNOSIS — R61 Generalized hyperhidrosis: Secondary | ICD-10-CM | POA: Insufficient documentation

## 2019-09-20 DIAGNOSIS — T402X5A Adverse effect of other opioids, initial encounter: Secondary | ICD-10-CM | POA: Insufficient documentation

## 2019-09-20 DIAGNOSIS — K5903 Drug induced constipation: Secondary | ICD-10-CM | POA: Insufficient documentation

## 2019-09-20 NOTE — Assessment & Plan Note (Signed)
Increase  Lisinopril to 2 a day, for 20 mg daily.

## 2019-09-20 NOTE — Assessment & Plan Note (Signed)
Eval for secondary causes with labs eval.

## 2019-09-20 NOTE — Assessment & Plan Note (Signed)
Discuss with Dr. Ernestina Patches back pain.. consider increasing to lyrica 150 mg three times a day vs add diclofenac daily for inflammation.

## 2019-09-20 NOTE — Assessment & Plan Note (Signed)
Can use miralax daily for constipation.

## 2019-09-23 ENCOUNTER — Other Ambulatory Visit: Payer: Self-pay | Admitting: Family Medicine

## 2019-09-23 NOTE — Telephone Encounter (Signed)
Last office visit 08/15/2019 for excessive sweating.  Last refilled 08/16/2019 for #90 with no refills. CPE scheduled for 11/29/2019.

## 2019-09-24 ENCOUNTER — Ambulatory Visit: Payer: Self-pay | Admitting: Psychology

## 2019-10-20 ENCOUNTER — Other Ambulatory Visit: Payer: Self-pay

## 2019-10-21 NOTE — Telephone Encounter (Signed)
Name of Medication: Oxycodone Name of Pharmacy: [facility] Last Fill or Written Date and Quantity: 07/23/2019 #30 Last Office Visit and Type: 08/15/2019 acute issues Next Office Visit and Type: 11/21/2019 CPE Last Controlled Substance Agreement Date: 08/17/2018  Last UDS:11/20/2018

## 2019-10-22 NOTE — Telephone Encounter (Signed)
Call  Last seen for chronic pain 07/23/2019... due for 3 month pain follow up... consider moving  Feb appt sooner or making separate appt for chronic pain.  Rx denied.. will fill at appt.

## 2019-10-22 NOTE — Telephone Encounter (Signed)
Please review. Also refill for Oxycodone was requested yesterday and I have sent that message to you already in a separate request.

## 2019-10-24 ENCOUNTER — Other Ambulatory Visit: Payer: Self-pay

## 2019-10-24 ENCOUNTER — Ambulatory Visit (INDEPENDENT_AMBULATORY_CARE_PROVIDER_SITE_OTHER): Payer: Medicare Other | Admitting: Family Medicine

## 2019-10-24 ENCOUNTER — Encounter: Payer: Self-pay | Admitting: Family Medicine

## 2019-10-24 VITALS — BP 150/78 | HR 82 | Temp 98.2°F | Ht 72.5 in | Wt 228.5 lb

## 2019-10-24 DIAGNOSIS — M5440 Lumbago with sciatica, unspecified side: Secondary | ICD-10-CM | POA: Diagnosis not present

## 2019-10-24 DIAGNOSIS — K5903 Drug induced constipation: Secondary | ICD-10-CM

## 2019-10-24 DIAGNOSIS — G894 Chronic pain syndrome: Secondary | ICD-10-CM | POA: Diagnosis not present

## 2019-10-24 DIAGNOSIS — M5412 Radiculopathy, cervical region: Secondary | ICD-10-CM | POA: Diagnosis not present

## 2019-10-24 DIAGNOSIS — G8929 Other chronic pain: Secondary | ICD-10-CM

## 2019-10-24 DIAGNOSIS — T402X5A Adverse effect of other opioids, initial encounter: Secondary | ICD-10-CM

## 2019-10-24 MED ORDER — OXYCODONE HCL 5 MG PO CAPS: 5.0000 mg | ORAL_CAPSULE | Freq: Every day | ORAL | 0 refills | Status: DC | PRN

## 2019-10-24 NOTE — Patient Instructions (Signed)
Continue walking as much as able, low back and neck stretching.

## 2019-10-24 NOTE — Progress Notes (Addendum)
Chief Complaint  Patient presents with  . Pain Management    History of Present Illness: HPI   He has been receiving ESI from Dr. Ernestina Patches.. last 08/21/2019   Drug induced ed constipation: stable on fiber and water. No longer needs colace now eating more oatmeal.    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 Christella Noa.   Had visit for epidural with Dr. Ernestina Patches on 07/03/2019 This helped temporarily and now he has had recurrent pain... was back for  2 weeks, no red flags ( NO perineal numbness, incontinence, foot drop) , some pain keeping him up at night and greater walking up stairs.  In last few weeks hass gotten better.. now scheduled for 11/06/2019 repeat ESI    Neck pain: He reports no new injury no trauma  Has follow up with Dr. Ernestina Patches 11/05/2018. No current pain in neck.  He is  on lyrica 150 mg BID for chronic pain.  No change in lyrica dose since last appointment.   Indication for chronic opioid: chronic neck and shoulder pain Medication and dose: Using oxycodone 5 mg daily prn. # pills per month: 30 per 3 months Last UDS date: 11/21/2018 Opioid Treatment Agreement signed (Y/N): Y Opioid Treatment Agreement last reviewed with patient:  Y NCCSRS reviewed this encounter (include red flags):  none 10/23/2018    This visit occurred during the SARS-CoV-2 public health emergency.  Safety protocols were in place, including screening questions prior to the visit, additional usage of staff PPE, and extensive cleaning of exam room while observing appropriate contact time as indicated for disinfecting solutions.   COVID 19 screen:  No recent travel or known exposure to COVID19 The patient denies respiratory symptoms of COVID 19 at this time. The importance of social distancing was discussed today.     Review of Systems  Constitutional: Negative for chills and fever.  HENT: Negative for congestion and  ear pain.   Eyes: Negative for pain and redness.  Respiratory: Negative for cough and shortness of breath.   Cardiovascular: Negative for chest pain, palpitations and leg swelling.  Gastrointestinal: Negative for abdominal pain, blood in stool, constipation, diarrhea, nausea and vomiting.  Genitourinary: Negative for dysuria.  Musculoskeletal: Positive for back pain. Negative for falls and myalgias.  Skin: Negative for rash.  Neurological: Negative for dizziness.  Psychiatric/Behavioral: Negative for depression. The patient is not nervous/anxious.       Past Medical History:  Diagnosis Date  . Depression    bipolar  . Diverticulosis   . Hyperlipidemia   . Hypertension   . Mold exposure   . Sciatic nerve pain     reports that he has never smoked. He has never used smokeless tobacco. He reports current drug use. Drug: Marijuana. He reports that he does not drink alcohol.   Current Outpatient Medications:  .  Cholecalciferol (VITAMIN D) 50 MCG (2000 UT) tablet, Take 2,000 Units by mouth daily., Disp: , Rfl:  .  FIBER PO, Take 2 tablets by mouth 3 (three) times daily. gummies, Disp: , Rfl:  .  lisinopril (ZESTRIL) 10 MG tablet, Take 20 mg by mouth daily., Disp: , Rfl:  .  Multiple Vitamins-Minerals (MULTIVITAMIN ADULT PO), Take 2 tablets by mouth daily. gummies, Disp: , Rfl:  .  oxycodone (OXY-IR) 5 MG capsule, Take 1 capsule (5 mg total) by mouth daily. (Patient taking differently: Take 5 mg by mouth as needed for pain. ), Disp: 30 capsule,  Rfl: 0 .  pregabalin (LYRICA) 150 MG capsule, TAKE 1 CAPSULE BY MOUTH THREE TIMES DAILY, Disp: 90 capsule, Rfl: 0   Observations/Objective: Blood pressure (!) 150/78, pulse 82, temperature 98.2 F (36.8 C), temperature source Temporal, height 6' 0.5" (1.842 m), weight 228 lb 8 oz (103.6 kg), SpO2 95 %.  Physical Exam Constitutional:      Appearance: He is well-developed.  HENT:     Head: Normocephalic.     Right Ear: Hearing normal.     Left  Ear: Hearing normal.     Nose: Nose normal.  Neck:     Thyroid: No thyroid mass or thyromegaly.     Vascular: No carotid bruit.     Trachea: Trachea normal.  Cardiovascular:     Rate and Rhythm: Normal rate and regular rhythm.     Pulses: Normal pulses.     Heart sounds: Heart sounds not distant. No murmur. No friction rub. No gallop.      Comments: No peripheral edema Pulmonary:     Effort: Pulmonary effort is normal. No respiratory distress.     Breath sounds: Normal breath sounds.  Musculoskeletal:     Cervical back: No tenderness. Decreased range of motion.     Thoracic back: Normal.     Lumbar back: Tenderness present. No deformity or bony tenderness. Decreased range of motion. Negative right straight leg raise test and negative left straight leg raise test.  Skin:    General: Skin is warm and dry.     Findings: No rash.  Psychiatric:        Speech: Speech normal.        Behavior: Behavior normal.        Thought Content: Thought content normal.      Assessment and Plan He is  on lyrica 150 mg BID for chronic pain.   Using oxycodone 5 mg daily prn. Refill needed at this point. Indication for chronic opioid:Chronic backpain and  Right shoulder neck pain.. Medication and dose:oxycodone 5 mg daily # pills per month: usually < 30 per month, last 30 lasted < 43months. Last UDS date: 11/21/2018 Opioid Treatment Agreement signed (Y/N):Y 02/18/2019 Opioid Treatment Agreement last reviewed with patient:Y NCCSRS reviewed this encounter (include red flags):no red flags since last 3 month pain visit.10/24/19  Constipation improved with water fiber and .. does not need daily colace now.     Kerby Nora, MD

## 2019-10-31 DIAGNOSIS — M961 Postlaminectomy syndrome, not elsewhere classified: Secondary | ICD-10-CM | POA: Insufficient documentation

## 2019-10-31 DIAGNOSIS — M47812 Spondylosis without myelopathy or radiculopathy, cervical region: Secondary | ICD-10-CM | POA: Insufficient documentation

## 2019-10-31 NOTE — Procedures (Deleted)
Diagnostic Cervical Facet Joint Nerve Block with Fluoroscopic Guidance  [name]      Date of Birth: May 12, 1961 MRN: [id number] PCP: Excell Seltzer, MD      Visit Date: 08/21/2019   Universal Protocol:    Date/Time: 01/14/215:59 AM  Consent Given By: the patient  Position: LATERAL  Additional Comments: Vital signs were monitored before and after the procedure. Patient was prepped and draped in the usual sterile fashion. The correct patient, procedure, and site was verified.   Injection Procedure Details:  Procedure Site One Meds Administered:  Meds ordered this encounter  Medications  . methylPREDNISolone acetate (DEPO-MEDROL) injection 80 mg     Laterality: Right  Location/Site:  C2-3  Needle size: 25 G  Needle type: spinal needle  Needle Placement: Articular Pillar  Findings:  -Contrast Used: 0.5 mL iohexol 180 mg iodine/mL   -Comments: Excellent flow over the articular pillar without intravascular flow.  Procedure Details: The fluoroscope beam was manipulated to achieve the best "true" lateral view possible by squaring off the endplates with cranial and caudal tilt and using varying obliquity to achieve the a view with the longest length of spinous process.  The region overlying the facet joints mentioned above were then localized under fluoroscopic visualization.  The needle was inserted down to the center of the "trapezoid" outline of the facet joint lateral mass. Bi-planar images were used for confirming placement and spot radiographs were documented.  A 0.25 ml volume of Omnipaque-240 was injected to look for vascular uptake. A 0.5 ml. volume of the anesthetic solution was injected onto the target. This procedure was repeated for each medial branch nerve injected.  Prior to the procedure, the patient was given a Pain Diary which was completed for baseline measurements.  After the procedure, the patient rated their pain every 30 minutes and will  continue rating at this frequency for a total of 5 hours.  The patient has been asked to complete the Diary and return to [place] by mail, fax or hand delivered as soon as possible.   Additional Comments:  The patient tolerated the procedure well Dressing: Band-Aid    Post-procedure details: Patient was observed during the procedure. Post-procedure instructions were reviewed.  Patient left the clinic in stable condition.

## 2019-10-31 NOTE — Procedures (Signed)
Diagnostic Cervical Facet Joint Nerve Block with Fluoroscopic Guidance  [name]      Date of Birth: 07-Dec-1960 MRN: [id number] PCP: Excell Seltzer, MD      Visit Date: 08/21/2019   Universal Protocol:    Date/Time: 01/14/216:05 AM  Consent Given By: the patient  Position: LATERAL  Additional Comments: Vital signs were monitored before and after the procedure. Patient was prepped and draped in the usual sterile fashion. The correct patient, procedure, and site was verified.   Injection Procedure Details:  Procedure Site One Meds Administered:  Meds ordered this encounter  Medications  . methylPREDNISolone acetate (DEPO-MEDROL) injection 80 mg     Laterality: Right  Location/Site:  C2-3  Needle size: 25 G  Needle type: Standard  Needle Placement: Articular Pillar  Findings:  -Contrast Used: 0.5 mL iohexol 180 mg iodine/mL   -Comments: Excellent flow of the articular pillars without intravascular flow  Procedure Details: The fluoroscope beam was manipulated to achieve the best "true" lateral view possible by squaring off the endplates with cranial and caudal tilt and using varying obliquity to achieve the a view with the longest length of spinous process.  The region overlying the facet joints mentioned above were then localized under fluoroscopic visualization.  The needle was inserted down to the center of the "trapezoid" outline of the facet joint lateral mass. Bi-planar images were used for confirming placement and spot radiographs were documented.  A 0.25 ml volume of Omnipaque-240 was injected to look for vascular uptake. A 0.5 ml. volume of the anesthetic solution was injected onto the target. This procedure was repeated for each medial branch nerve injected.  Prior to the procedure, the patient was given a Pain Diary which was completed for baseline measurements.  After the procedure, the patient rated their pain every 30 minutes and will continue  rating at this frequency for a total of 5 hours.  The patient has been asked to complete the Diary and return to [place] by mail, fax or hand delivered as soon as possible.   Additional Comments:  The patient tolerated the procedure well Dressing: Band-Aid    Post-procedure details: Patient was observed during the procedure. Post-procedure instructions were reviewed.  Patient left the clinic in stable condition.

## 2019-10-31 NOTE — Progress Notes (Signed)
Adrian Cox - 59 y.o. male MRN [id number]  Date of birth: September 27, 1961  Office Visit Note: Visit Date: 08/21/2019 PCP: Jinny Sanders, MD Referred by: Jinny Sanders, MD  Subjective: Chief Complaint  Patient presents with  . Neck - Pain  . Right Shoulder - Pain   HPI:  Adrian Cox is a 59 y.o. male who comes in today For reevaluation of neck and right shoulder pain status post facet joint injection on July 25, 2019. This was a facet joint block at C2-3 on the right. This was based on clinical exam interview and findings as well as imaging. 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 Where he points and where he gets referral pain does seem like it could be related to this facet joint. He did get relief with the injection diagnostically just did not last very long. He is currently on chronic opioid treatment. He rates his pain as an 8 out of 10. I think at this point diagnostic medial branch block/facet block at this level again would be beneficial and if it helps him temporarily could look at ablation of the spot. If it does not seem to help the neck step would be potential for cervical epidural injection. He really needs to be seen by his neurosurgeon for evaluation although he does not really want to have surgery there is adjacent level disease.  ROS Otherwise per HPI.  Assessment & Plan: Visit Diagnoses:  1. Cervical spondylosis without myelopathy   2. Cervicalgia   3. Cervical radiculopathy   4. Post laminectomy syndrome     Plan: No additional findings.   Meds & Orders:  Meds ordered this encounter  Medications  . methylPREDNISolone acetate (DEPO-MEDROL) injection 80 mg    Orders Placed This Encounter  Procedures  . Facet Injection  . XR C-ARM NO REPORT  . Epidural Steroid injection    Follow-up: Return if symptoms worsen  or fail to improve.   Procedures: No procedures performed  Diagnostic Cervical Facet Joint Nerve Block with Fluoroscopic Guidance  [name]      Date of Birth: 05-25-1961 MRN: [id number] PCP: Jinny Sanders, MD      Visit Date: 08/21/2019   Universal Protocol:    Date/Time: 01/14/216:05 AM  Consent Given By: the patient  Position: LATERAL  Additional Comments: Vital signs were monitored before and after the procedure. Patient was prepped and draped in the usual sterile fashion. The correct patient, procedure, and site was verified.   Injection Procedure Details:  Procedure Site One Meds Administered:  Meds ordered this encounter  Medications  . methylPREDNISolone acetate (DEPO-MEDROL) injection 80 mg     Laterality: Right  Location/Site:  C2-3  Needle size: 25 G  Needle type: Standard  Needle Placement: Articular Pillar  Findings:  -Contrast Used: 0.5 mL iohexol 180 mg iodine/mL   -Comments: Excellent flow of the articular pillars without intravascular flow  Procedure Details: The fluoroscope beam was manipulated to achieve the best "true" lateral view possible by squaring off the endplates with cranial and caudal tilt and using varying obliquity to achieve the a view with the longest length of spinous process.  The region overlying the facet joints mentioned above were then localized under fluoroscopic visualization.  The needle was inserted down to the center of the "trapezoid" outline of the  facet joint lateral mass. Bi-planar images were used for confirming placement and spot radiographs were documented.  A 0.25 ml volume of Omnipaque-240 was injected to look for vascular uptake. A 0.5 ml. volume of the anesthetic solution was injected onto the target. This procedure was repeated for each medial branch nerve injected.  Prior to the procedure, the patient was given a Pain Diary which was completed for baseline measurements.  After the procedure,  the patient rated their pain every 30 minutes and will continue rating at this frequency for a total of 5 hours.  The patient has been asked to complete the Diary and return to [place] by mail, fax or hand delivered as soon as possible.   Additional Comments:  The patient tolerated the procedure well Dressing: Band-Aid    Post-procedure details: Patient was observed during the procedure. Post-procedure instructions were reviewed.  Patient left the clinic in stable condition.      Clinical History: MRI CERVICAL SPINE WITHOUT CONTRAST  TECHNIQUE: Multiplanar, multisequence MR imaging of the cervical spine was performed. No intravenous contrast was administered.  COMPARISON:  Cervical spine radiographs 02/23/2017. Cervical spine CT 06/29/2016.  FINDINGS: Alignment: Stable since 2018 with straightening and mild reversal of cervical lordosis. Chronic mild retrolisthesis of C3 on C4.  Vertebrae: Chronic C6-C7 ACDF with evidence of solid arthrodesis. Mild associated susceptibility artifact. Right side confluent marrow edema in chronically degenerated right C2 and C3 facets (series 7, image 2). Up to mild surrounding soft tissue inflammation. Bone marrow signal elsewhere appears normal. No other No acute osseous abnormality identified.  Cord: No cervical spinal cord signal abnormality despite multilevel mass effect on the cord, see below.  Posterior Fossa, vertebral arteries, paraspinal tissues: Cervicomedullary junction is within normal limits. Negative visible posterior fossa. Partially empty sella. Major vascular flow voids in the neck are preserved. Negative visible neck soft tissues aside from those adjacent to the right C2-C3 facets.  Disc levels:  C2-C3: Circumferential disc bulge and endplate spurring is broad-based and eccentric to the left posteriorly. See series 9, image 6. Mild spinal stenosis. Mild if any cord mass effect. Superimposed facet hypertrophy  which is moderate on the right. Associated right side facet edema. Mild left and moderate right C3 foraminal stenosis.  C3-C4: Mild chronic retrolisthesis with disc space loss and circumferential disc osteophyte complex. Broad-based left paracentral component (series 9, image 10). Spinal stenosis with mild to moderate spinal cord mass effect. Severe bilateral C4 foraminal stenosis.  C4-C5: Disc space loss with circumferential disc osteophyte complex. Broad-based right eccentric posterior component (series 9, image 15). Spinal stenosis with mild to moderate spinal cord mass effect. Severe bilateral C5 foraminal stenosis.  C5-C6: Disc space loss with circumferential disc osteophyte complex. Bulky and broad-based right paracentral component (series 8, image 20). Mild posterior element hypertrophy. Spinal stenosis with mild spinal cord mass effect. Severe left and moderate to severe right C6 foraminal stenosis.  C6-C7:  Prior ACDF with solid arthrodesis and no stenosis.  C7-T1: Disc space loss with circumferential disc osteophyte complex, bulky left far lateral component and broad-based posterior component. Mild posterior element hypertrophy. Mild spinal stenosis, up to mild spinal cord mass effect. Severe left and mild to moderate right C8 foraminal stenosis.  Mild T1-T2 disc bulging without significant upper thoracic spinal stenosis.  IMPRESSION: 1. 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 Marrow edema there is superimposed on chronic disc and posterior element degeneration which results in  mild spinal stenosis and up to moderate right C3 foraminal stenosis. 2. Chronic C6-C7 ACDF with solid arthrodesis. 3. Advanced disc and endplate degeneration C3-C4 through C5-C6 with multifactorial spinal stenosis at each level. Up to moderate associated spinal cord mass effect, but no cord signal  abnormality. Severe bilateral C4, C5 and left greater than right C6 foraminal stenosis. 4. C7-T1 degeneration with mild spinal stenosis, severe left C8 foraminal stenosis.   Electronically Signed   By: Odessa Fleming M.D.   On: 07/21/2019 08:33     Objective:  VS:  HT:    WT:   BMI:     BP:138/78  HR:85bpm  TEMP: ( )  RESP:  Physical Exam  Ortho Exam Imaging: No results found.

## 2019-11-06 ENCOUNTER — Other Ambulatory Visit: Payer: Self-pay

## 2019-11-06 ENCOUNTER — Encounter: Payer: Self-pay | Admitting: Physical Medicine and Rehabilitation

## 2019-11-06 ENCOUNTER — Ambulatory Visit (INDEPENDENT_AMBULATORY_CARE_PROVIDER_SITE_OTHER): Payer: Medicare Other | Admitting: Physical Medicine and Rehabilitation

## 2019-11-06 ENCOUNTER — Ambulatory Visit: Payer: Self-pay

## 2019-11-06 VITALS — BP 172/83 | HR 69

## 2019-11-06 DIAGNOSIS — M48062 Spinal stenosis, lumbar region with neurogenic claudication: Secondary | ICD-10-CM | POA: Diagnosis not present

## 2019-11-06 DIAGNOSIS — M5416 Radiculopathy, lumbar region: Secondary | ICD-10-CM | POA: Diagnosis not present

## 2019-11-06 MED ORDER — METHYLPREDNISOLONE ACETATE 80 MG/ML IJ SUSP
40.0000 mg | Freq: Once | INTRAMUSCULAR | Status: AC
  Administered 2019-11-06: 14:00:00 40 mg

## 2019-11-06 NOTE — Progress Notes (Signed)
Numeric Pain Rating Scale and Functional Assessment Average Pain 10   In the last MONTH (on 0-10 scale) has pain interfered with the following?  1. General activity like being  able to carry out your everyday physical activities such as walking, climbing stairs, carrying groceries, or moving a chair?  Rating(10)   +Driver, -BT, -Dye Allergies. 

## 2019-11-17 ENCOUNTER — Other Ambulatory Visit: Payer: Self-pay | Admitting: Family Medicine

## 2019-11-18 NOTE — Telephone Encounter (Signed)
Last office visit 10/24/2019 for chronic pain.  Last refilled 09/24/2019 for #90 with no refills.  CPE scheduled for 01/24/2020.

## 2019-11-27 ENCOUNTER — Ambulatory Visit: Payer: Self-pay

## 2019-11-29 ENCOUNTER — Encounter: Payer: Medicare Other | Admitting: Family Medicine

## 2019-12-07 ENCOUNTER — Other Ambulatory Visit: Payer: Self-pay | Admitting: Family Medicine

## 2019-12-09 ENCOUNTER — Other Ambulatory Visit: Payer: Self-pay

## 2019-12-09 NOTE — Telephone Encounter (Signed)
Left message for Adrian Cox to return my call.  I need to clarify how he is taking his Lisinopril.

## 2019-12-09 NOTE — Telephone Encounter (Signed)
Pt returned your call He stated he is taking 2 pills a day.  Both are taken in am 10mg  each   He also needs refill on oxycodone [facility] pt is out of meds

## 2019-12-09 NOTE — Telephone Encounter (Signed)
Last office visit 10/24/2019 for Pain Management.  Last refilled 10/24/2019 for #30 with no refills.  UDS/Contract 11/20/2018.  CPE scheduled 01/24/2020.

## 2019-12-10 MED ORDER — OXYCODONE HCL 5 MG PO CAPS: 5.0000 mg | ORAL_CAPSULE | Freq: Every day | ORAL | 0 refills | Status: DC | PRN

## 2019-12-10 NOTE — Telephone Encounter (Signed)
Please enter oxycodone prescriptions to be filled monthly until next OV in 01/2019 and I will sign.

## 2019-12-31 ENCOUNTER — Other Ambulatory Visit: Payer: Self-pay | Admitting: Family Medicine

## 2019-12-31 NOTE — Telephone Encounter (Signed)
Last office visit 10/24/2019 for chronic pain syndrome.  Last refilled 11/18/2019 for #90 with no refills.  CPE scheduled for 01/28/2020.  UDS/Contract 11/20/2018.

## 2020-01-17 ENCOUNTER — Ambulatory Visit: Payer: Medicare Other

## 2020-01-21 ENCOUNTER — Other Ambulatory Visit: Payer: Self-pay

## 2020-01-21 ENCOUNTER — Telehealth: Payer: Self-pay | Admitting: Family Medicine

## 2020-01-21 ENCOUNTER — Ambulatory Visit (INDEPENDENT_AMBULATORY_CARE_PROVIDER_SITE_OTHER): Payer: Medicare Other

## 2020-01-21 VITALS — Wt 220.0 lb

## 2020-01-21 DIAGNOSIS — Z0000 Encounter for general adult medical examination without abnormal findings: Secondary | ICD-10-CM

## 2020-01-21 DIAGNOSIS — Z125 Encounter for screening for malignant neoplasm of prostate: Secondary | ICD-10-CM

## 2020-01-21 DIAGNOSIS — I10 Essential (primary) hypertension: Secondary | ICD-10-CM

## 2020-01-21 DIAGNOSIS — E785 Hyperlipidemia, unspecified: Secondary | ICD-10-CM

## 2020-01-21 NOTE — Progress Notes (Signed)
Subjective:   Adrian Cox is a 59 y.o. male who presents for Medicare Annual/Subsequent preventive examination.  Review of Systems: N/A   This visit is being conducted through telemedicine via telephone at the nurse health advisor's home address due to the COVID-19 pandemic. This patient has given me verbal consent via doximity to conduct this visit, patient states they are participating from their home address. Patient and myself are on the telephone call. There is no referral for this visit. Some vital signs may be absent or patient reported.    Patient identification: identified by name, DOB, and current address   Cardiac Risk Factors include: advanced age (>1men, >36 women);male gender;hypertension;dyslipidemia     Objective:    Vitals: Wt 220 lb (99.8 kg)   BMI 29.43 kg/m   Body mass index is 29.43 kg/m.  Advanced Directives 01/21/2020 08/18/2019 11/19/2018 09/05/2018 08/15/2018 07/30/2018 05/06/2018  Does Patient Have a Medical Advance Directive? Yes No No No No No No  Type of Estate agent of [place];Living will - - - - - -  Copy of Healthcare Power of Attorney in Chart? No - copy requested - - - - - -  Would patient like information on creating a medical advance directive? - Yes (ED - Information included in AVS) No - Patient declined - - No - Patient declined No - Patient declined    Tobacco Social History   Tobacco Use  Smoking Status Never Smoker  Smokeless Tobacco Never Used     Counseling given: Not Answered   Clinical Intake:  Pre-visit preparation completed: Yes  Pain : 0-10 Pain Score: 10-Worst pain ever Pain Type: Chronic pain Pain Location: Back Pain Descriptors / Indicators: Aching Pain Onset: More than a month ago Pain Frequency: Constant     Nutritional Risks: None  How often do you need to have someone help you when you read instructions, pamphlets, or other written materials from your doctor or pharmacy?: 1 -  Never What is the last grade level you completed in school?: some college  Interpreter Needed?: No  Information entered by :: CJohnson, LPN  Past Medical History:  Diagnosis Date  . Depression    bipolar  . Diverticulosis   . Hyperlipidemia   . Hypertension   . Mold exposure   . Sciatic nerve pain    Past Surgical History:  Procedure Laterality Date  . BACK SURGERY    . CERVICAL SPINE SURGERY  2003   fusion  . KNEE ARTHROSCOPY Right   . KNEE ARTHROSCOPY Left   . SPINE SURGERY     Family History  Problem Relation Age of Onset  . Colon cancer Mother 5  . Hypertension Mother   . Alzheimer's disease Father   . Hypertension Father   . Hypertension Sister   . Diabetes Sister   . Diabetes Maternal Grandmother   . Hypertension Maternal Grandmother   . Hypertension Sister   . Diabetes Sister    Social History   Socioeconomic History  . Marital status: Married    Spouse name: Not on file  . Number of children: 0  . Years of education: Not on file  . Highest education level: Not on file  Occupational History  . Occupation: retired  Tobacco Use  . Smoking status: Never Smoker  . Smokeless tobacco: Never Used  Substance and Sexual Activity  . Alcohol use: No  . Drug use: Yes    Types: Marijuana    Comment: Hx marijuana use -  none x 2 yrs  . Sexual activity: Yes  Other Topics Concern  . Not on file  Social History Narrative   Patient is married, has no children. He does not smoke drink or use any substances. He has a high school education. He is currently not employed. He does some walking, but really does not get a lot of regular exercise. He has membership at [employer] but he doesn't use it   Social Determinants of Corporate investment banker Strain: Low Risk   . Difficulty of Paying Living Expenses: Not hard at all  Food Insecurity: No Food Insecurity  . Worried About Programme researcher, broadcasting/film/video in the Last Year: Never true  . Ran Out of Food in the Last Year: Never  true  Transportation Needs: No Transportation Needs  . Lack of Transportation (Medical): No  . Lack of Transportation (Non-Medical): No  Physical Activity: Sufficiently Active  . Days of Exercise per Week: 7 days  . Minutes of Exercise per Session: 60 min  Stress: No Stress Concern Present  . Feeling of Stress : Not at all  Social Connections:   . Frequency of Communication with Friends and Family:   . Frequency of Social Gatherings with Friends and Family:   . Attends Religious Services:   . Active Member of Clubs or Organizations:   . Attends Banker Meetings:   Marland Kitchen Marital Status:     Outpatient Encounter Medications as of 01/21/2020  Medication Sig  . Cholecalciferol (VITAMIN D) 50 MCG (2000 UT) tablet Take 2,000 Units by mouth daily.  Marland Kitchen FIBER PO Take 2 tablets by mouth 3 (three) times daily. gummies  . lisinopril (ZESTRIL) 10 MG tablet Take 2 tablets (20 mg total) by mouth daily.  . Multiple Vitamins-Minerals (MULTIVITAMIN ADULT PO) Take 2 tablets by mouth daily. gummies  . oxycodone (OXY-IR) 5 MG capsule Take 1 capsule (5 mg total) by mouth daily as needed for pain.  . pregabalin (LYRICA) 150 MG capsule TAKE 1 CAPSULE BY MOUTH THREE TIMES DAILY   No facility-administered encounter medications on file as of 01/21/2020.    Activities of Daily Living In your present state of health, do you have any difficulty performing the following activities: 01/21/2020  Hearing? N  Vision? N  Difficulty concentrating or making decisions? N  Walking or climbing stairs? N  Dressing or bathing? N  Doing errands, shopping? N  Preparing Food and eating ? N  Using the Toilet? N  In the past six months, have you accidently leaked urine? N  Do you have problems with loss of bowel control? N  Managing your Medications? N  Managing your Finances? N  Housekeeping or managing your Housekeeping? N  Some recent data might be hidden    Patient Care Team: Excell Seltzer, MD as PCP -  General (Family Medicine)   Assessment:   This is a routine wellness examination for Adrian Cox.  Exercise Activities and Dietary recommendations Current Exercise Habits: Home exercise routine, Type of exercise: strength training/weights, Time (Minutes): 60, Frequency (Times/Week): 7, Weekly Exercise (Minutes/Week): 420, Intensity: Moderate, Exercise limited by: None identified  Goals    . Increase physical activity     Starting 11/19/2018, I will continue to exercise at least 60 minutes daily.     . Patient Stated     01/21/2020, I will continue to exercise at least 60 minutes everyday.       Fall Risk Fall Risk  01/21/2020 11/19/2018 11/14/2017 01/03/2017  Falls in the past year? 0 1 No No  Comment - multiple falls due to sciatic pain and weakness of legs - -  Number falls in past yr: 0 1 - -  Injury with Fall? 0 0 - -  Risk for fall due to : Medication side effect - - -  Follow up Falls evaluation completed;Falls prevention discussed - - -   Is the patient's home free of loose throw rugs in walkways, pet beds, electrical cords, etc?   yes      Grab bars in the bathroom? yes      Handrails on the stairs?   yes      Adequate lighting?   yes  Timed Get Up and Go Performed: N/A  Depression Screen PHQ 2/9 Scores 01/21/2020 11/19/2018 09/06/2018 11/14/2017  PHQ - 2 Score 0 2 2 1   PHQ- 9 Score 0 6 14 -    Cognitive Function MMSE - Mini Mental State Exam 01/21/2020 11/19/2018 01/03/2017  Orientation to time 5 5 5   Orientation to Place 5 5 5   Registration 3 3 3   Attention/ Calculation 5 0 0  Recall 3 3 3   Language- name 2 objects - 0 0  Language- repeat 1 1 1   Language- follow 3 step command - 3 3  Language- read & follow direction - 0 0  Write a sentence - 0 0  Copy design - 0 0  Total score - 20 20  Mini Cog  Mini-Cog screen was completed. Maximum score is 22. A value of 0 denotes this part of the MMSE was not completed or the patient failed this part of the Mini-Cog screening.        Immunization History  Administered Date(s) Administered  . Influenza,inj,Quad PF,6+ Mos 06/26/2018, 05/31/2019  . Influenza-Unspecified 07/08/2016, 06/04/2017  . PFIZER SARS-COV-2 Vaccination 12/25/2019  . Tdap 11/14/2017    Qualifies for Shingles Vaccine? Yes  Screening Tests Health Maintenance  Topic Date Due  . INFLUENZA VACCINE  05/17/2020  . COLONOSCOPY  07/18/2022  . TETANUS/TDAP  11/15/2027  . Hepatitis C Screening  Completed  . HIV Screening  Completed   Cancer Screenings: Lung: Low Dose CT Chest recommended if Age 28-80 years, 30 pack-year currently smoking OR have quit w/in 15 years. Patient does not qualify. Colorectal: completed 07/18/2017  Additional Screenings:  Hepatitis C Screening: 08/30/2016      Plan:   Patient will continue to exercise at least 60 minutes everyday.  I have personally reviewed and noted the following in the patient's chart:   . Medical and social history . Use of alcohol, tobacco or illicit drugs  . Current medications and supplements . Functional ability and status . Nutritional status . Physical activity . Advanced directives . List of other physicians . Hospitalizations, surgeries, and ER visits in previous 12 months . Vitals . Screenings to include cognitive, depression, and falls . Referrals and appointments  In addition, I have reviewed and discussed with patient certain preventive protocols, quality metrics, and best practice recommendations. A written personalized care plan for preventive services as well as general preventive health recommendations were provided to patient.     Andrez Grime, LPN  06/18/6383

## 2020-01-21 NOTE — Progress Notes (Signed)
PCP notes:  Health Maintenance: No gaps noted   Abnormal Screenings: none   Patient concerns: none   Nurse concerns: none   Next PCP appt.: 01/31/2020 @ 2:40 pm

## 2020-01-21 NOTE — Patient Instructions (Signed)
Adrian Cox , Thank you for taking time to come for your Medicare Wellness Visit. I appreciate your ongoing commitment to your health goals. Please review the following plan we discussed and let me know if I can assist you in the future.   Screening recommendations/referrals: Colonoscopy: Up to date, completed 07/18/2017 Recommended yearly ophthalmology/optometry visit for glaucoma screening and checkup Recommended yearly dental visit for hygiene and checkup  Vaccinations: Influenza vaccine: Up to date, completed 05/31/2019 Pneumococcal vaccine: age 60 Tdap vaccine: Up to date, completed 11/14/2017 Shingles vaccine: discussed    Advanced directives: Please bring a copy of your POA (Power of Adrian Cox) and/or Living Will to your next appointment.   Conditions/risks identified: hypertension, hyperlipidemia  Next appointment: 01/28/2020 @ 2:40 pm   Preventive Care 40-64 Years, Male Preventive care refers to lifestyle choices and visits with your health care provider that can promote health and wellness. What does preventive care include?  A yearly physical exam. This is also called an annual well check.  Dental exams once or twice a year.  Routine eye exams. Ask your health care provider how often you should have your eyes checked.  Personal lifestyle choices, including:  Daily care of your teeth and gums.  Regular physical activity.  Eating a healthy diet.  Avoiding tobacco and drug use.  Limiting alcohol use.  Practicing safe sex.  Taking low-dose aspirin every day starting at age 60. What happens during an annual well check? The services and screenings done by your health care provider during your annual well check will depend on your age, overall health, lifestyle risk factors, and family history of disease. Counseling  Your health care provider may ask you questions about your:  Alcohol use.  Tobacco use.  Drug use.  Emotional well-being.  Home and relationship  well-being.  Sexual activity.  Eating habits.  Work and work Statistician. Screening  You may have the following tests or measurements:  Height, weight, and BMI.  Blood pressure.  Lipid and cholesterol levels. These may be checked every 5 years, or more frequently if you are over 40 years old.  Skin check.  Lung cancer screening. You may have this screening every year starting at age 60 if you have a 30-pack-year history of smoking and currently smoke or have quit within the past 15 years.  Fecal occult blood test (FOBT) of the stool. You may have this test every year starting at age 60.  Flexible sigmoidoscopy or colonoscopy. You may have a sigmoidoscopy every 5 years or a colonoscopy every 10 years starting at age 60  Prostate cancer screening. Recommendations will vary depending on your family history and other risks.  Hepatitis C blood test.  Hepatitis B blood test.  Sexually transmitted disease (STD) testing.  Diabetes screening. This is done by checking your blood sugar (glucose) after you have not eaten for a while (fasting). You may have this done every 1-3 years. Discuss your test results, treatment options, and if necessary, the need for more tests with your health care provider. Vaccines  Your health care provider may recommend certain vaccines, such as:  Influenza vaccine. This is recommended every year.  Tetanus, diphtheria, and acellular pertussis (Tdap, Td) vaccine. You may need a Td booster every 10 years.  Zoster vaccine. You may need this after age 60.  Pneumococcal 13-valent conjugate (PCV13) vaccine. You may need this if you have certain conditions and have not been vaccinated.  Pneumococcal polysaccharide (PPSV23) vaccine. You may need one or two doses  if you smoke cigarettes or if you have certain conditions. Talk to your health care provider about which screenings and vaccines you need and how often you need them. This information is not intended  to replace advice given to you by your health care provider. Make sure you discuss any questions you have with your health care provider. Document Released: 10/30/2015 Document Revised: 06/22/2016 Document Reviewed: 08/04/2015 Elsevier Interactive Patient Education  2017 [place] Prevention in the Home Falls can cause injuries. They can happen to people of all ages. There are many things you can do to make your home safe and to help prevent falls. What can I do on the outside of my home?  Regularly fix the edges of walkways and driveways and fix any cracks.  Remove anything that might make you trip as you walk through a door, such as a raised step or threshold.  Trim any bushes or trees on the path to your home.  Use bright outdoor lighting.  Clear any walking paths of anything that might make someone trip, such as rocks or tools.  Regularly check to see if handrails are loose or broken. Make sure that both sides of any steps have handrails.  Any raised decks and porches should have guardrails on the edges.  Have any leaves, snow, or ice cleared regularly.  Use sand or salt on walking paths during winter.  Clean up any spills in your garage right away. This includes oil or grease spills. What can I do in the bathroom?  Use night lights.  Install grab bars by the toilet and in the tub and shower. Do not use towel bars as grab bars.  Use non-skid mats or decals in the tub or shower.  If you need to sit down in the shower, use a plastic, non-slip stool.  Keep the floor dry. Clean up any water that spills on the floor as soon as it happens.  Remove soap buildup in the tub or shower regularly.  Attach bath mats securely with double-sided non-slip rug tape.  Do not have throw rugs and other things on the floor that can make you trip. What can I do in the bedroom?  Use night lights.  Make sure that you have a light by your bed that is easy to reach.  Do not use  any sheets or blankets that are too big for your bed. They should not hang down onto the floor.  Have a firm chair that has side arms. You can use this for support while you get dressed.  Do not have throw rugs and other things on the floor that can make you trip. What can I do in the kitchen?  Clean up any spills right away.  Avoid walking on wet floors.  Keep items that you use a lot in easy-to-reach places.  If you need to reach something above you, use a strong step stool that has a grab bar.  Keep electrical cords out of the way.  Do not use floor polish or wax that makes floors slippery. If you must use wax, use non-skid floor wax.  Do not have throw rugs and other things on the floor that can make you trip. What can I do with my stairs?  Do not leave any items on the stairs.  Make sure that there are handrails on both sides of the stairs and use them. Fix handrails that are broken or loose. Make sure that handrails are as long  as the stairways.  Check any carpeting to make sure that it is firmly attached to the stairs. Fix any carpet that is loose or worn.  Avoid having throw rugs at the top or bottom of the stairs. If you do have throw rugs, attach them to the floor with carpet tape.  Make sure that you have a light switch at the top of the stairs and the bottom of the stairs. If you do not have them, ask someone to add them for you. What else can I do to help prevent falls?  Wear shoes that:  Do not have high heels.  Have rubber bottoms.  Are comfortable and fit you well.  Are closed at the toe. Do not wear sandals.  If you use a stepladder:  Make sure that it is fully opened. Do not climb a closed stepladder.  Make sure that both sides of the stepladder are locked into place.  Ask someone to hold it for you, if possible.  Clearly mark and make sure that you can see:  Any grab bars or handrails.  First and last steps.  Where the edge of each step  is.  Use tools that help you move around (mobility aids) if they are needed. These include:  Canes.  Walkers.  Scooters.  Crutches.  Turn on the lights when you go into a dark area. Replace any light bulbs as soon as they burn out.  Set up your furniture so you have a clear path. Avoid moving your furniture around.  If any of your floors are uneven, fix them.  If there are any pets around you, be aware of where they are.  Review your medicines with your doctor. Some medicines can make you feel dizzy. This can increase your chance of falling. Ask your doctor what other things that you can do to help prevent falls. This information is not intended to replace advice given to you by your health care provider. Make sure you discuss any questions you have with your health care provider. Document Released: 07/30/2009 Document Revised: 03/10/2016 Document Reviewed: 11/07/2014 Elsevier Interactive Patient Education  2017 [employer].

## 2020-01-21 NOTE — Telephone Encounter (Signed)
-----   Message from Aquilla Solian, RT sent at 01/09/2020  5:17 PM EDT ----- Regarding: Lab Orders for Friday 4.9.2021 Please place lab orders for Friday 4.9.2021, office visit for physical on Tuesday 4.13.2021 Thank you, Jones Bales RT(R)

## 2020-01-24 ENCOUNTER — Encounter: Payer: Medicare Other | Admitting: Family Medicine

## 2020-01-24 ENCOUNTER — Other Ambulatory Visit: Payer: Medicare Other

## 2020-01-28 ENCOUNTER — Other Ambulatory Visit: Payer: Self-pay

## 2020-01-28 ENCOUNTER — Encounter: Payer: Self-pay | Admitting: Family Medicine

## 2020-01-28 ENCOUNTER — Ambulatory Visit (INDEPENDENT_AMBULATORY_CARE_PROVIDER_SITE_OTHER): Payer: Medicare Other | Admitting: Family Medicine

## 2020-01-28 VITALS — BP 140/60 | HR 76 | Temp 98.5°F | Ht 71.5 in | Wt 214.2 lb

## 2020-01-28 DIAGNOSIS — G894 Chronic pain syndrome: Secondary | ICD-10-CM

## 2020-01-29 MED ORDER — OXYCODONE HCL 5 MG PO CAPS: 5.0000 mg | ORAL_CAPSULE | Freq: Every day | ORAL | 0 refills | Status: DC | PRN

## 2020-01-29 NOTE — Telephone Encounter (Signed)
Last office 10/24/2019 for chronic pain management. Last refilled 12/10/2019 for #30 with no refills.   Patient was here yesterday 01/28/2020 for CPE 2 but had to leave due to Dr. Ermalene Searing running behind.  He had to go pick up his wife.  I did get new contract signed yesterday.  Patient is due for UDS.  He rescheduled his CPE to Friday 01/31/2020

## 2020-01-30 NOTE — Progress Notes (Signed)
Pt left without being seen.

## 2020-01-31 ENCOUNTER — Encounter: Payer: Self-pay | Admitting: Family Medicine

## 2020-01-31 ENCOUNTER — Other Ambulatory Visit: Payer: Self-pay

## 2020-01-31 ENCOUNTER — Ambulatory Visit (INDEPENDENT_AMBULATORY_CARE_PROVIDER_SITE_OTHER): Payer: Medicare Other | Admitting: Family Medicine

## 2020-01-31 VITALS — BP 140/84 | HR 89 | Temp 98.7°F | Ht 71.5 in | Wt 212.5 lb

## 2020-01-31 DIAGNOSIS — Z0000 Encounter for general adult medical examination without abnormal findings: Secondary | ICD-10-CM | POA: Diagnosis not present

## 2020-01-31 DIAGNOSIS — G8929 Other chronic pain: Secondary | ICD-10-CM

## 2020-01-31 DIAGNOSIS — G894 Chronic pain syndrome: Secondary | ICD-10-CM | POA: Diagnosis not present

## 2020-01-31 DIAGNOSIS — E785 Hyperlipidemia, unspecified: Secondary | ICD-10-CM | POA: Diagnosis not present

## 2020-01-31 DIAGNOSIS — Z125 Encounter for screening for malignant neoplasm of prostate: Secondary | ICD-10-CM | POA: Diagnosis not present

## 2020-01-31 DIAGNOSIS — N529 Male erectile dysfunction, unspecified: Secondary | ICD-10-CM

## 2020-01-31 DIAGNOSIS — M5440 Lumbago with sciatica, unspecified side: Secondary | ICD-10-CM

## 2020-01-31 DIAGNOSIS — I10 Essential (primary) hypertension: Secondary | ICD-10-CM

## 2020-01-31 DIAGNOSIS — F319 Bipolar disorder, unspecified: Secondary | ICD-10-CM

## 2020-01-31 LAB — LIPID PANEL
Cholesterol: 203 mg/dL — ABNORMAL HIGH (ref 0–200)
HDL: 37.1 mg/dL — ABNORMAL LOW (ref >39.00)
LDL Cholesterol: 147 mg/dL — ABNORMAL HIGH (ref 0–99)
NonHDL: 166.17
Total CHOL/HDL Ratio: 5
Triglycerides: 96 mg/dL (ref 0.0–149.0)
VLDL: 19.2 mg/dL (ref 0.0–40.0)

## 2020-01-31 LAB — COMPREHENSIVE METABOLIC PANEL
ALT: 40 U/L (ref 0–53)
AST: 31 U/L (ref 0–37)
Albumin: 4.6 g/dL (ref 3.5–5.2)
Alkaline Phosphatase: 69 U/L (ref 39–117)
BUN: 15 mg/dL (ref 6–23)
CO2: 28 mEq/L (ref 19–32)
Calcium: 9.5 mg/dL (ref 8.4–10.5)
Chloride: 106 mEq/L (ref 96–112)
Creatinine, Ser: 0.85 mg/dL (ref 0.40–1.50)
GFR: 111.52 mL/min (ref >60.00)
Glucose, Bld: 135 mg/dL — ABNORMAL HIGH (ref 70–99)
Potassium: 3.8 mEq/L (ref 3.5–5.1)
Sodium: 142 mEq/L (ref 135–145)
Total Bilirubin: 0.7 mg/dL (ref 0.2–1.2)
Total Protein: 7.8 g/dL (ref 6.0–8.3)

## 2020-01-31 LAB — PSA, MEDICARE: PSA: 2.1 ng/ml (ref 0.10–4.00)

## 2020-01-31 MED ORDER — OXYCODONE HCL 5 MG PO CAPS: 5.0000 mg | ORAL_CAPSULE | Freq: Every day | ORAL | 0 refills | Status: DC | PRN

## 2020-01-31 MED ORDER — TADALAFIL 20 MG PO TABS: 20.0000 mg | ORAL_TABLET | Freq: Every day | ORAL | 0 refills | Status: DC | PRN

## 2020-01-31 NOTE — Patient Instructions (Addendum)
Please stop at the lab to have labs drawn.  Follow BP at home.Marland Kitchen goal < 140/90.

## 2020-01-31 NOTE — Progress Notes (Signed)
Chief Complaint  Patient presents with  . Annual Exam    Part 2    History of Present Illness: HPI  The patient presents for  complete physical and review of chronic health problems. He/She also has the following acute concerns today: ED.. ongoing for years... cialis and viagra effective in past. No issue with desire. The patient saw a LPN or RN for medicare wellness visit.  Prevention and wellness was reviewed in detail. Note reviewed and important notes copied below.  Health Maintenance: No gaps noted   Abnormal Screenings: None  01/31/20 Hypertension:  Bordeline control on lisinopril 20 mg daily   BP Readings from Last 3 Encounters:  01/31/20 140/84  01/28/20 140/60  11/06/19 (!) 172/83  Using medication without problems or lightheadedness:  none Chest pain with exertion:none Edema: none Short of breath:none Average home BPs: 120/70 at home Other issues:  Indication for chronic opioid: chronic back pain and right upper thigh pain from dog bite. On lyrica as well. Followed by Dr. Ernestina Patches as well. Medication and dose: oxycodone 5 mg daily # pills per month:  30  Last UDS date: DUE today Opioid Treatment Agreement signed (Y/N): Y Opioid Treatment Agreement last reviewed with patient:  Y 01/31/20 NCCSRS reviewed this encounter (include red flags):   No red flags 01/31/20  Elevated Cholesterol:Due for re-eval. The 10-year ASCVD risk score Mikey Bussing DC Jr., et al., 2013) is: 16.5%   Values used to calculate the score:     Age: 59 years     Sex: Male     Is Non-Hispanic African American: Yes     Diabetic: No     Tobacco smoker: No     Systolic Blood Pressure: 027 mmHg     Is BP treated: Yes     HDL Cholesterol: 31 mg/dL     Total Cholesterol: 169 mg/dL  Diet compliance: moderate Exercise: walking daily Other complaints:  Bipolar disorder:  He has been off medication for this > 5 years ago. He denies any   Followed by counsleing.. Dr. Elvin So.      This  visit occurred during the SARS-CoV-2 public health emergency.  Safety protocols were in place, including screening questions prior to the visit, additional usage of staff PPE, and extensive cleaning of exam room while observing appropriate contact time as indicated for disinfecting solutions.   COVID 19 screen:  No recent travel or known exposure to COVID19 The patient denies respiratory symptoms of COVID 19 at this time. The importance of social distancing was discussed today.     Review of Systems  Constitutional: Negative for chills and fever.  HENT: Negative for congestion and ear pain.   Eyes: Negative for pain and redness.  Respiratory: Negative for cough and shortness of breath.   Cardiovascular: Negative for chest pain, palpitations and leg swelling.  Gastrointestinal: Negative for abdominal pain, blood in stool, constipation, diarrhea, nausea and vomiting.  Genitourinary: Negative for dysuria.  Musculoskeletal: Negative for falls and myalgias.  Skin: Negative for rash.  Neurological: Negative for dizziness.  Psychiatric/Behavioral: Negative for depression. The patient is not nervous/anxious.       Past Medical History:  Diagnosis Date  . Depression    bipolar  . Diverticulosis   . Hyperlipidemia   . Hypertension   . Mold exposure   . Sciatic nerve pain     reports that he has never smoked. He has never used smokeless tobacco. He reports current drug use. Drug: Marijuana. He reports that he does  not drink alcohol.   Current Outpatient Medications:  .  Cholecalciferol (VITAMIN D) 50 MCG (2000 UT) tablet, Take 2,000 Units by mouth daily., Disp: , Rfl:  .  FIBER PO, Take 2 tablets by mouth 3 (three) times daily. gummies, Disp: , Rfl:  .  lisinopril (ZESTRIL) 10 MG tablet, Take 2 tablets (20 mg total) by mouth daily., Disp: 180 tablet, Rfl: 1 .  Multiple Vitamins-Minerals (MULTIVITAMIN ADULT PO), Take 2 tablets by mouth daily. gummies, Disp: , Rfl:  .  oxycodone (OXY-IR) 5  MG capsule, Take 1 capsule (5 mg total) by mouth daily as needed for pain., Disp: 30 capsule, Rfl: 0 .  pregabalin (LYRICA) 150 MG capsule, TAKE 1 CAPSULE BY MOUTH THREE TIMES DAILY, Disp: 90 capsule, Rfl: 0   Observations/Objective: Blood pressure 140/84, pulse 89, temperature 98.7 F (37.1 C), temperature source Temporal, height 5' 11.5" (1.816 m), weight 212 lb 8 oz (96.4 kg), SpO2 97 %.  Physical Exam Constitutional:      General: He is not in acute distress.    Appearance: Normal appearance. He is well-developed. He is not ill-appearing or toxic-appearing.  HENT:     Head: Normocephalic and atraumatic.     Right Ear: Hearing, tympanic membrane, ear canal and external ear normal.     Left Ear: Hearing, tympanic membrane, ear canal and external ear normal.     Nose: Nose normal.     Mouth/Throat:     Pharynx: Uvula midline.  Eyes:     General: Lids are normal. Lids are everted, no foreign bodies appreciated.     Conjunctiva/sclera: Conjunctivae normal.     Pupils: Pupils are equal, round, and reactive to light.  Neck:     Thyroid: No thyroid mass or thyromegaly.     Vascular: No carotid bruit.     Trachea: Trachea and phonation normal.  Cardiovascular:     Rate and Rhythm: Normal rate and regular rhythm.     Pulses: Normal pulses.     Heart sounds: S1 normal and S2 normal. No murmur. No gallop.   Pulmonary:     Breath sounds: Normal breath sounds. No wheezing, rhonchi or rales.  Abdominal:     General: Bowel sounds are normal.     Palpations: Abdomen is soft.     Tenderness: There is no abdominal tenderness. There is no guarding or rebound.     Hernia: No hernia is present.  Musculoskeletal:     Cervical back: Normal range of motion and neck supple.  Lymphadenopathy:     Cervical: No cervical adenopathy.  Skin:    General: Skin is warm and dry.     Findings: No rash.  Neurological:     Mental Status: He is alert.     Cranial Nerves: No cranial nerve deficit.      Sensory: No sensory deficit.     Gait: Gait normal.     Deep Tendon Reflexes: Reflexes are normal and symmetric.  Psychiatric:        Speech: Speech normal.        Behavior: Behavior normal.        Judgment: Judgment normal.      Assessment and Plan   The patient's preventative maintenance and recommended screening tests for an annual wellness exam were reviewed in full today. Brought up to date unless services declined.  Counselled on the importance of diet, exercise, and its role in overall health and mortality. The patient's FH and SH was reviewed, including their  home life, tobacco status, and drug and alcohol status.   Vaccines uptodate with flu, TDap and COVID19. Prostate Cancer Screen:  Increase in PSA in last year.. follow  Lab Results  Component Value Date   PSA 2.21 11/19/2018   PSA 1.63 11/14/2017   PSA 1.98 08/17/2016        Colon Cancer Screen: 07/2017, repeat in 5 years      Smoking Status:none ETOH/ drug use: No ETOH/  Has given up marijuana Hep C: done HIV screen: done Colon: 2018.. pan repeat in 2023   Hyperlipidemia Due for re-eval.  Chronic pain syndrome Indication for chronic opioid: chronic back pain and right upper thigh pain from dog bite. On lyrica as well. Followed by Dr. Alvester Morin as well. Medication and dose: oxycodone 5 mg daily # pills per month:  30  Last UDS date: DUE today Opioid Treatment Agreement signed (Y/N): Y Opioid Treatment Agreement last reviewed with patient:  Y 01/31/20 NCCSRS reviewed.  3 refills sent to pharm... follow up in 3 months  Chronic low back pain with sciatica Back pain worse overall... requiring 1 tab po daily of oxycodone... continue lyrica. No SE.  Bipolar disorder (HCC) On no meds, followed by counselor.  Essential hypertension Well controlled. Continue current medication.   Erectile dysfunction Trial of cialis.. has worked in past.   Kerby Nora, MD

## 2020-01-31 NOTE — Assessment & Plan Note (Signed)
Trial of cialis.. has worked in past.

## 2020-01-31 NOTE — Assessment & Plan Note (Signed)
Back pain worse overall... requiring 1 tab po daily of oxycodone... continue lyrica. No SE.

## 2020-01-31 NOTE — Assessment & Plan Note (Signed)
Indication for chronic opioid: chronic back pain and right upper thigh pain from dog bite. On lyrica as well. Followed by Dr. Alvester Morin as well. Medication and dose: oxycodone 5 mg daily # pills per month:  30  Last UDS date: DUE today Opioid Treatment Agreement signed (Y/N): Y Opioid Treatment Agreement last reviewed with patient:  Y 01/31/20 NCCSRS reviewed.  3 refills sent to pharm... follow up in 3 months

## 2020-01-31 NOTE — Assessment & Plan Note (Signed)
On no meds, followed by counselor.

## 2020-01-31 NOTE — Assessment & Plan Note (Signed)
Well controlled. Continue current medication.  

## 2020-01-31 NOTE — Assessment & Plan Note (Signed)
Due for re-eval. 

## 2020-02-02 LAB — PAIN MGMT, PROFILE 8 W/CONF, U
6 Acetylmorphine: NEGATIVE ng/mL
Alcohol Metabolites: NEGATIVE ng/mL (ref <500)
Amphetamines: NEGATIVE ng/mL
Benzodiazepines: NEGATIVE ng/mL
Buprenorphine, Urine: NEGATIVE ng/mL
Cocaine Metabolite: NEGATIVE ng/mL
Creatinine: >300 mg/dL
MDMA: NEGATIVE ng/mL
Marijuana Metabolite: 962 ng/mL
Marijuana Metabolite: POSITIVE ng/mL
Opiates: NEGATIVE ng/mL
Oxidant: NEGATIVE ug/mL
Oxycodone: NEGATIVE ng/mL
pH: 5.6 (ref 4.5–9.0)

## 2020-02-07 ENCOUNTER — Ambulatory Visit (INDEPENDENT_AMBULATORY_CARE_PROVIDER_SITE_OTHER): Payer: Medicare Other | Admitting: Family Medicine

## 2020-02-07 ENCOUNTER — Other Ambulatory Visit: Payer: Self-pay

## 2020-02-07 ENCOUNTER — Encounter: Payer: Self-pay | Admitting: Family Medicine

## 2020-02-07 ENCOUNTER — Telehealth: Payer: Self-pay

## 2020-02-07 VITALS — BP 170/88 | HR 80 | Temp 98.0°F | Ht 71.5 in | Wt 220.1 lb

## 2020-02-07 DIAGNOSIS — M545 Low back pain, unspecified: Secondary | ICD-10-CM

## 2020-02-07 DIAGNOSIS — R3129 Other microscopic hematuria: Secondary | ICD-10-CM | POA: Diagnosis not present

## 2020-02-07 DIAGNOSIS — G8929 Other chronic pain: Secondary | ICD-10-CM

## 2020-02-07 DIAGNOSIS — M5441 Lumbago with sciatica, right side: Secondary | ICD-10-CM

## 2020-02-07 LAB — POC URINALSYSI DIPSTICK (AUTOMATED)
Bilirubin, UA: NEGATIVE
Blood, UA: 25
Glucose, UA: NEGATIVE
Ketones, UA: NEGATIVE
Leukocytes, UA: NEGATIVE
Nitrite, UA: NEGATIVE
Protein, UA: NEGATIVE
Spec Grav, UA: 1.015 (ref 1.010–1.025)
Urobilinogen, UA: 0.2 E.U./dL
pH, UA: 6.5 (ref 5.0–8.0)

## 2020-02-07 MED ORDER — PREDNISONE 10 MG PO TABS: ORAL_TABLET | ORAL | 0 refills | Status: DC

## 2020-02-07 MED ORDER — CYCLOBENZAPRINE HCL 10 MG PO TABS: 10.0000 mg | ORAL_TABLET | Freq: Every evening | ORAL | 0 refills | Status: DC | PRN

## 2020-02-07 MED ORDER — ATORVASTATIN CALCIUM 10 MG PO TABS: 10.0000 mg | ORAL_TABLET | Freq: Every day | ORAL | 3 refills | Status: DC

## 2020-02-07 NOTE — Assessment & Plan Note (Signed)
Acute flare with spasm and more pain  Per pt- does not happen often  Usually goes to gym/walks  Taking oxycocone prn and regular lyrica   Px pred taper 40 mg to take (with food to minimize GI side eff) Update if not starting to improve in a week or if worsening    Elevated bp may be due to pain today

## 2020-02-07 NOTE — Telephone Encounter (Signed)
I spoke with pt; pt took pain pill earlier this morning and back spasm eased; pt did not go to ED and pt wants appt at [facility]. Dr Ermalene Searing does not have available appt; pt scheduled 30' in office appt with Dr Milinda Antis 02/07/20 at 10:45. Ed precautions given and pt voiced understanding. FYI to Dr Milinda Antis.Pt has no covid symptoms, no travel and no known exposure to + covid.

## 2020-02-07 NOTE — Patient Instructions (Signed)
Let's try a short course of prednisone to get you through this flare of back pain   Take with food! -most important   Use heat if helpful  Keep walking  Continue current medicines Walk as tolerated   Update if not starting to improve in a week or if worsening

## 2020-02-07 NOTE — Progress Notes (Signed)
Subjective:    Patient ID: Adrian Cox, male    DOB: Dec 09, 1960, 59 y.o.   MRN: [id number]  This visit occurred during the SARS-CoV-2 public health emergency.  Safety protocols were in place, including screening questions prior to the visit, additional usage of staff PPE, and extensive cleaning of exam room while observing appropriate contact time as indicated for disinfecting solutions.    HPI 59 yo pt of Dr Ermalene Searing presents with c/o back spasms Also incidental finding of microscopic blood in urine    He has a h/o chronic low back pain with radiculopathy  Reviewed old records and imaging today as pt is new to me   Takes lyrica Also oxycodone He has failed consv tx incl PT and ESI Has also had surgery with post laminectomy syndrome (sees Dr Alvester Morin as well)  Last MRI in epic from 2/19: IMPRESSION: 1. Status post LEFT L5 hemilaminectomy. Grade 1 L4-5 anterolisthesis and grade 1 L5-S1 retrolisthesis. No acute osseous process. 2. 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 L5-S1 lateral recess stenosis and granulation tissue may affect the traversing S1 nerves. 3. Neural foraminal narrowing L3-4 through L5-S1: Moderate to severe on the RIGHT at L4-5.    Usually does not have breakthrough pain unless weather changes  Does not happen that often   It hit on Monday Severe low back rad to R leg  Then spasms up into mid back - hard to move  Uses a cane on the R side   When bad he has to stop moving and lie down    Usually walks and goes to the gym -loves it  Has not been able to go this week due to pain    Today - pretty bad but he is moving around   bp is up today-presumably due to pain BP Readings from Last 3 Encounters:  02/07/20 (!) 170/88  01/31/20 140/84  01/28/20 140/60     ua today= scant blood  Pain meds constipate him  He takes fiber   Results for orders placed or performed in visit on 02/07/20  POCT Urinalysis Dipstick (Automated)  Result Value Ref  Range   Color, UA Yellow    Clarity, UA Clear    Glucose, UA Negative Negative   Bilirubin, UA Negative    Ketones, UA Negative    Spec Grav, UA 1.015 1.010 - 1.025   Blood, UA 25 Ery/uL    pH, UA 6.5 5.0 - 8.0   Protein, UA Negative Negative   Urobilinogen, UA 0.2 0.2 or 1.0 E.U./dL   Nitrite, UA Negative    Leukocytes, UA Negative Negative    No h/o renal problems/kidney stones No visible blood at all No urinary symptoms  Culture sent  Patient Active Problem List   Diagnosis Date Noted  . Microscopic hematuria 02/07/2020  . Erectile dysfunction 01/31/2020  . Cervical spondylosis without myelopathy 10/31/2019  . Post laminectomy syndrome 10/31/2019  . Constipation due to opioid therapy 09/20/2019  . Excessive sweating 09/20/2019  . Left leg pain 02/28/2019  . Elevated liver function tests 11/20/2018  . Chronic pain syndrome 11/20/2018  . Cervical radiculopathy 09/05/2018  . Lumbar radiculopathy 08/17/2018  . Pain in right thigh 03/27/2018  . Lateral knee pain, right 03/27/2018  . 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 Chronic low back pain with sciatica 11/09/2017  . Osteoarthritis of spine 02/23/2017  . Chronic right shoulder pain 01/09/2017  . Essential hypertension 08/17/2016  . Bipolar disorder (HCC) 08/17/2016  . Hyperlipidemia  08/17/2016   Past Medical History:  Diagnosis Date  . Depression    bipolar  . Diverticulosis   . Hyperlipidemia   . Hypertension   . Mold exposure   . Sciatic nerve pain    Past Surgical History:  Procedure Laterality Date  . BACK SURGERY    . CERVICAL SPINE SURGERY  2003   fusion  . KNEE ARTHROSCOPY Right   . KNEE ARTHROSCOPY Left   . SPINE SURGERY     Social History   Tobacco Use  . Smoking status: Never Smoker  . Smokeless tobacco: Never Used  Substance Use Topics  . Alcohol use: No  . Drug use: Yes    Types: Marijuana    Comment: Hx marijuana use - none x 2 yrs   Family History  Problem Relation Age of Onset   . Colon cancer Mother 69  . Hypertension Mother   . Alzheimer's disease Father   . Hypertension Father   . Hypertension Sister   . Diabetes Sister   . Diabetes Maternal Grandmother   . Hypertension Maternal Grandmother   . Hypertension Sister   . Diabetes Sister    No Known Allergies Current Outpatient Medications on File Prior to Visit  Medication Sig Dispense Refill  . Cholecalciferol (VITAMIN D) 50 MCG (2000 UT) tablet Take 2,000 Units by mouth daily.    Marland Kitchen FIBER PO Take 2 tablets by mouth 3 (three) times daily. gummies    . lisinopril (ZESTRIL) 10 MG tablet Take 2 tablets (20 mg total) by mouth daily. 180 tablet 1  . Multiple Vitamins-Minerals (MULTIVITAMIN ADULT PO) Take 2 tablets by mouth daily. gummies    . oxycodone (OXY-IR) 5 MG capsule Take 1 capsule (5 mg total) by mouth daily as needed for pain. Do not fill until 04/01/2020 30 capsule 0  . pregabalin (LYRICA) 150 MG capsule TAKE 1 CAPSULE BY MOUTH THREE TIMES DAILY 90 capsule 0  . tadalafil (CIALIS) 20 MG tablet Take 1 tablet (20 mg total) by mouth daily as needed for erectile dysfunction. 10 tablet 0   No current facility-administered medications on file prior to visit.     Review of Systems  Constitutional: Negative for activity change, appetite change, fatigue, fever and unexpected weight change.  HENT: Negative for congestion, rhinorrhea, sore throat and trouble swallowing.   Eyes: Negative for pain, redness, itching and visual disturbance.  Respiratory: Negative for cough, chest tightness, shortness of breath and wheezing.   Cardiovascular: Negative for chest pain and palpitations.  Gastrointestinal: Negative for abdominal pain, blood in stool, constipation, diarrhea and nausea.  Endocrine: Negative for cold intolerance, heat intolerance, polydipsia and polyuria.  Genitourinary: Negative for difficulty urinating, dysuria, flank pain, frequency, hematuria and urgency.  Musculoskeletal: Positive for back pain and gait  problem. Negative for arthralgias, joint swelling and myalgias.  Skin: Negative for pallor and rash.  Neurological: Negative for dizziness, tremors, weakness, numbness and headaches.  Hematological: Negative for adenopathy. Does not bruise/bleed easily.  Psychiatric/Behavioral: Negative for decreased concentration and dysphoric mood. The patient is not nervous/anxious.        Objective:   Physical Exam Constitutional:      General: He is not in acute distress.    Appearance: Normal appearance. He is well-developed. He is obese. He is not ill-appearing.     Comments: Pt is in some discomfort  HENT:     Head: Normocephalic and atraumatic.  Eyes:     General: No scleral icterus.    Conjunctiva/sclera: Conjunctivae  normal.     Pupils: Pupils are equal, round, and reactive to light.  Cardiovascular:     Rate and Rhythm: Normal rate and regular rhythm.  Pulmonary:     Effort: Pulmonary effort is normal.     Breath sounds: Normal breath sounds. No wheezing or rales.  Abdominal:     General: Bowel sounds are normal. There is no distension.     Palpations: Abdomen is soft.     Tenderness: There is no abdominal tenderness.     Comments: No suprapubic pain   Musculoskeletal:        General: Tenderness present.     Cervical back: Neck supple. No swelling or deformity.     Lumbar back: Spasms and tenderness present. No edema, deformity or bony tenderness. Decreased range of motion. Negative right straight leg raise test and negative left straight leg raise test.     Right lower leg: No edema.     Left lower leg: No edema.     Comments: LS- tender over lower lumbar vertebrae and lumbar musculature bilat  Nl leg raise  Gait favors L side (using cane on R today) Flex -almost 90 deg with pain Extension is more painful  No acute neuro changes   Lymphadenopathy:     Cervical: No cervical adenopathy.  Skin:    General: Skin is warm and dry.     Coloration: Skin is not pale.     Findings:  No erythema or rash.  Neurological:     Mental Status: He is alert.     Sensory: No sensory deficit.     Motor: No atrophy or abnormal muscle tone.     Coordination: Coordination normal.     Deep Tendon Reflexes: Reflexes are normal and symmetric. Reflexes normal.     Comments: Negative SLR  Psychiatric:        Mood and Affect: Mood normal.           Assessment & Plan:   Problem List Items Addressed This Visit      Nervous and Auditory   Chronic low back pain with sciatica - Primary    Acute flare with spasm and more pain  Per pt- does not happen often  Usually goes to gym/walks  Taking oxycocone prn and regular lyrica   Px pred taper 40 mg to take (with food to minimize GI side eff) Update if not starting to improve in a week or if worsening    Elevated bp may be due to pain today      Relevant Medications   predniSONE (DELTASONE) 10 MG tablet     Genitourinary   Microscopic hematuria    No symptoms No h/o renal stones Mild blood on dip test cx sent      Relevant Orders   Urine Culture    Other Visit Diagnoses    Low back pain, unspecified back pain laterality, unspecified chronicity, unspecified whether sciatica present       Relevant Medications   predniSONE (DELTASONE) 10 MG tablet   Other Relevant Orders   POCT Urinalysis Dipstick (Automated) (Completed)

## 2020-02-07 NOTE — Assessment & Plan Note (Signed)
No symptoms No h/o renal stones Mild blood on dip test cx sent

## 2020-02-07 NOTE — Telephone Encounter (Signed)
[facility] Night - Client TELEPHONE ADVICE RECORD AccessNurse Patient Name: Adrian Cox Gender: Male DOB: 1961-04-07 Age: 59 Y 3 M 23 D Return Phone Number: [phone] (Secondary), [phone] (Alternate) Address[place] [address] Client [facility] Night - Client Client Site [facility] - Night Physician Kerby Nora - MD Contact Type Call Who Is Calling Patient / Member / Family / Caregiver Call Type Triage / Clinical Relationship To Patient Self Return Phone Number Please choose phone number Chief Complaint Back Pain - General Reason for Call Symptomatic / Request for Health Information Initial Comment Caller states that they are calling about having back spams and he is a lot pain. Translation No Nurse Assessment Nurse: Earlene Plater, RN, Lupita Leash Date/Time Lamount Cohen Time): 02/07/2020 7:03:06 AM Confirm and document reason for call. If symptomatic, describe symptoms. ---Caller states that they are calling about having lower back spams and intermittent pain. Pain 10/10. No fever, cough or SOB. Has the patient had close contact with a person known or suspected to have the novel coronavirus illness OR traveled / lives in area with major community spread (including international travel) in the last 14 days from the onset of symptoms? * If Asymptomatic, screen for exposure and travel within the last 14 days. ---No Does the patient have any new or worsening symptoms? ---Yes Will a triage be completed? ---Yes Related visit to physician within the last 2 weeks? ---Yes Does the PT have any chronic conditions? (i.e. diabetes, asthma, this includes High risk factors for pregnancy, etc.) ---Yes List chronic conditions. ---HTN Degenerative Pain Is this a behavioral health or substance abuse call? ---No Guidelines Guideline Title Affirmed Question Affirmed Notes Nurse Date/Time (Eastern Time) Back  Pain [1] SEVERE abdominal pain AND [2] present > 1 hour Reyes Ivan 02/07/2020 7:05:12 AM Disp. Time Lamount Cohen Time) Disposition Final User 02/07/2020 7:07:39 AM Go to ED Now (or PCP triage) Yes Earlene Plater, RN, Marolyn Hammock NOTE: All timestamps contained within this report are represented as [place] Standard Time. CONFIDENTIALTY NOTICE: This fax transmission is intended only for the addressee. It contains information that is legally privileged, confidential or otherwise protected from use or disclosure. If you are not the intended recipient, you are strictly prohibited from reviewing, disclosing, copying using or disseminating any of this information or taking any action in reliance on or regarding this information. If you have received this fax in error, please notify us immediately by telephone so that we can arrange for its return to [place]. Phone: [phone], Toll-Free: [phone], Fax: [phone] Page: 2 of 2 Call Id: [id number] Caller Disagree/Comply Comply Caller Understands Yes PreDisposition Call Doctor Care Advice Given Per Guideline GO TO ED NOW (OR PCP TRIAGE): * IF NO PCP (PRIMARY CARE PROVIDER) SECOND-LEVEL TRIAGE: You need to be seen within the next hour. Go to the ED/UCC at _____________ Hospital. Leave as soon as you can. BRING MEDICINES: * Please bring a list of your current medicines when you go to the Emergency Department (ER). * It is also a good idea to bring the pill bottles too. This will help the doctor to make certain you are taking the right medicines and the right dose. ANOTHER ADULT SHOULD DRIVE: * It is better and safer if another adult drives instead of you. CARE ADVICE given per Back Pain (Adult) guideline. Referrals [facility] - ED

## 2020-02-07 NOTE — Telephone Encounter (Signed)
Aware, I will see him then °

## 2020-02-08 LAB — URINE CULTURE
MICRO NUMBER:: 10399616
Result:: NO GROWTH
SPECIMEN QUALITY:: ADEQUATE

## 2020-02-10 ENCOUNTER — Telehealth: Payer: Self-pay | Admitting: *Deleted

## 2020-02-10 NOTE — Telephone Encounter (Signed)
PA was completed on CoverMyMeds.  [facility] notified of approval via fax.

## 2020-02-10 NOTE — Telephone Encounter (Signed)
Donette with [employer] left a voicemail stating that she was calling to advise that the PA for cyclobenzaprine has been approved. Donette stated that the approval is good from 02/09/20 until 02/08/21. If any questions call [phone] option 5.

## 2020-02-17 ENCOUNTER — Other Ambulatory Visit: Payer: Self-pay

## 2020-02-17 ENCOUNTER — Telehealth: Payer: Self-pay

## 2020-02-17 ENCOUNTER — Other Ambulatory Visit: Payer: Medicare Other

## 2020-02-17 NOTE — Telephone Encounter (Signed)
[facility] Night - Client Nonclinical Telephone Record AccessNurse Client Adrian [facility] Night - Client Client Site [facility] - Night Contact Type Call Who Is Calling Patient / Member / Family / Caregiver Caller Name Adrian Cox Caller Phone Number [phone] Patient Name Adrian Cox Patient DOB [birth year] Call Type Message Only Information Provided Reason for Call Request to [facility] Appointment Initial Comment Caller states that he needs to cancel appointment for tomorrow. Not feeling well. Additional Comment Disp. Time Disposition Final User 02/16/2020 8:43:43 PM General Information Provided Yes Loretha Stapler Call Closed By: Loretha Stapler Transaction Date/Time: 02/16/2020 8:40:42 PM (ET)

## 2020-02-17 NOTE — Telephone Encounter (Signed)
Appointment cancelled

## 2020-02-24 DIAGNOSIS — H524 Presbyopia: Secondary | ICD-10-CM | POA: Diagnosis not present

## 2020-02-24 NOTE — Progress Notes (Signed)
[name] - 59 y.o. male MRN [id number]  Date of birth: [birth year]  Office Visit Note: Visit Date: 11/06/2019 PCP: Jinny Sanders, MD Referred by: Jinny Sanders, MD  Subjective: Chief Complaint  Patient presents with  . Lower Back - Pain  . Right Leg - Pain  . Left Leg - Pain   HPI:  Adrian Cox is a 59 y.o. male who comes in today for planned Bilateral L4-L5 lumbar transforaminal epidural steroid injection with fluoroscopic guidance.  The patient has failed conservative care including home exercise, medications, time and activity modification.  This injection will be diagnostic and hopefully therapeutic.  Please see requesting physician notes for further details and justification.  Last injection in September 2020 made him quite a bit of relief.   ROS Otherwise per HPI.  Assessment & Plan: Visit Diagnoses:  1. Lumbar radiculopathy   2. Spinal stenosis of lumbar region with neurogenic claudication     Plan: No additional findings.   Meds & Orders:  Meds ordered this encounter  Medications  . methylPREDNISolone acetate (DEPO-MEDROL) injection 40 mg    Orders Placed This Encounter  Procedures  . XR C-ARM NO REPORT  . Epidural Steroid injection    Follow-up: Return if symptoms worsen or fail to improve.   Procedures: No procedures performed  Lumbosacral Transforaminal Epidural Steroid Injection - Sub-Pedicular Approach with Fluoroscopic Guidance  [name]      Date of Birth: Dec 14, 1960 MRN: [id number] PCP: Jinny Sanders, MD      Visit Date: 11/06/2019   Universal Protocol:    Date/Time: 11/06/2019  Consent Given By: the patient  Position: PRONE  Additional Comments: Vital signs were monitored before and after the procedure. Patient was prepped and draped in the usual sterile fashion. The correct patient, procedure, and site was verified.   Injection Procedure Details:  Procedure Site One Meds Administered:  Meds ordered this  encounter  Medications  . methylPREDNISolone acetate (DEPO-MEDROL) injection 40 mg    Laterality: Bilateral  Location/Site:  L4-L5  Needle size: 22 G  Needle type: Spinal  Needle Placement: Transforaminal  Findings:    -Comments: Excellent flow of contrast along the nerve and into the epidural space.  Procedure Details: After squaring off the end-plates to get a true AP view, the C-arm was positioned so that an oblique view of the foramen as noted above was visualized. The target area is just inferior to the "nose of the scotty dog" or sub pedicular. The soft tissues overlying this structure were infiltrated with 2-3 ml. of 1% Lidocaine without Epinephrine.  The spinal needle was inserted toward the target using a "trajectory" view along the fluoroscope beam.  Under AP and lateral visualization, the needle was advanced so it did not puncture dura and was located close the 6 O'Clock position of the pedical in AP tracterory. Biplanar projections were used to confirm position. Aspiration was confirmed to be negative for CSF and/or blood. A 1-2 ml. volume of Isovue-250 was injected and flow of contrast was noted at each level. Radiographs were obtained for documentation purposes.   After attaining the desired flow of contrast documented above, a 0.5 to 1.0 ml test dose of 0.25% Marcaine was injected into each respective transforaminal space.  The patient was observed for 90 seconds post injection.  After no sensory deficits were reported, and normal lower extremity motor function was noted,   the above injectate was administered so that equal amounts of the  injectate were placed at each foramen (level) into the transforaminal epidural space.   Additional Comments:  The patient tolerated the procedure well Dressing: 2 x 2 sterile gauze and Band-Aid    Post-procedure details: Patient was observed during the procedure. Post-procedure instructions were reviewed.  Patient left the clinic  in stable condition.      Clinical History: MRI CERVICAL SPINE WITHOUT CONTRAST  TECHNIQUE: Multiplanar, multisequence MR imaging of the cervical spine was performed. No intravenous contrast was administered.  COMPARISON:  Cervical spine radiographs 02/23/2017. Cervical spine CT 06/29/2016.  FINDINGS: Alignment: Stable since 2018 with straightening and mild reversal of cervical lordosis. Chronic mild retrolisthesis of C3 on C4.  Vertebrae: Chronic C6-C7 ACDF with evidence of solid arthrodesis. Mild associated susceptibility artifact. Right side confluent marrow edema in chronically degenerated right C2 and C3 facets (series 7, image 2). Up to mild surrounding soft tissue inflammation. Bone marrow signal elsewhere appears normal. No other No acute osseous abnormality identified.  Cord: No cervical spinal cord signal abnormality despite multilevel mass effect on the cord, see below.  Posterior Fossa, vertebral arteries, paraspinal tissues: Cervicomedullary junction is within normal limits. Negative visible posterior fossa. Partially empty sella. Major vascular flow voids in the neck are preserved. Negative visible neck soft tissues aside from those adjacent to the right C2-C3 facets.  Disc levels:  C2-C3: Circumferential disc bulge and endplate spurring is broad-based and eccentric to the left posteriorly. See series 9, image 6. Mild spinal stenosis. Mild if any cord mass effect. Superimposed facet hypertrophy which is moderate on the right. Associated right side facet edema. Mild left and moderate right C3 foraminal stenosis.  C3-C4: Mild chronic retrolisthesis with disc space loss and circumferential disc osteophyte complex. Broad-based left paracentral component (series 9, image 10). Spinal stenosis with mild to moderate spinal cord mass effect. Severe bilateral C4 foraminal stenosis.  C4-C5: Disc space loss with circumferential disc osteophyte  complex. Broad-based right eccentric posterior component (series 9, image 15). Spinal stenosis with mild to moderate spinal cord mass effect. Severe bilateral C5 foraminal stenosis.  C5-C6: Disc space loss with circumferential disc osteophyte complex. Bulky and broad-based right paracentral component (series 8, image 20). Mild posterior element hypertrophy. Spinal stenosis with mild spinal cord mass effect. Severe left and moderate to severe right C6 foraminal stenosis.  C6-C7:  Prior ACDF with solid arthrodesis and no stenosis.  C7-T1: Disc space loss with circumferential disc osteophyte complex, bulky left far lateral component and broad-based posterior component. Mild posterior element hypertrophy. Mild spinal stenosis, up to mild spinal cord mass effect. Severe left and mild to moderate right C8 foraminal stenosis.  Mild T1-T2 disc bulging without significant upper thoracic spinal stenosis.  IMPRESSION: 1. 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 Marrow edema there is superimposed on chronic disc and posterior element degeneration which results in mild spinal stenosis and up to moderate right C3 foraminal stenosis. 2. Chronic C6-C7 ACDF with solid arthrodesis. 3. Advanced disc and endplate degeneration C3-C4 through C5-C6 with multifactorial spinal stenosis at each level. Up to moderate associated spinal cord mass effect, but no cord signal abnormality. Severe bilateral C4, C5 and left greater than right C6 foraminal stenosis. 4. C7-T1 degeneration with mild spinal stenosis, severe left C8 foraminal stenosis.   Electronically Signed   By: Odessa Fleming M.D.   On: 07/21/2019 08:33     Objective:  VS:  HT:    WT:  BMI:     BP:(!) 172/83  HR:69bpm  TEMP: ( )  RESP:  Physical Exam Constitutional:      General: He is not in acute distress.    Appearance: Normal appearance. He is not  ill-appearing.  HENT:     Head: Normocephalic and atraumatic.     Right Ear: External ear normal.     Left Ear: External ear normal.  Eyes:     Extraocular Movements: Extraocular movements intact.  Cardiovascular:     Rate and Rhythm: Normal rate.     Pulses: Normal pulses.  Abdominal:     General: There is no distension.     Palpations: Abdomen is soft.  Musculoskeletal:        General: No tenderness or signs of injury.     Right lower leg: No edema.     Left lower leg: No edema.     Comments: Patient has good distal strength without clonus.  Skin:    Findings: No erythema or rash.  Neurological:     General: No focal deficit present.     Mental Status: He is alert and oriented to person, place, and time.     Sensory: No sensory deficit.     Motor: No weakness or abnormal muscle tone.     Coordination: Coordination normal.  Psychiatric:        Mood and Affect: Mood normal.        Behavior: Behavior normal.      Imaging: No results found.

## 2020-02-24 NOTE — Procedures (Signed)
Lumbosacral Transforaminal Epidural Steroid Injection - Sub-Pedicular Approach with Fluoroscopic Guidance  [name]      Date of Birth: 1961/06/13 MRN: [id number] PCP: Excell Seltzer, MD      Visit Date: 11/06/2019   Universal Protocol:    Date/Time: 11/06/2019  Consent Given By: the patient  Position: PRONE  Additional Comments: Vital signs were monitored before and after the procedure. Patient was prepped and draped in the usual sterile fashion. The correct patient, procedure, and site was verified.   Injection Procedure Details:  Procedure Site One Meds Administered:  Meds ordered this encounter  Medications  . methylPREDNISolone acetate (DEPO-MEDROL) injection 40 mg    Laterality: Bilateral  Location/Site:  L4-L5  Needle size: 22 G  Needle type: Spinal  Needle Placement: Transforaminal  Findings:    -Comments: Excellent flow of contrast along the nerve and into the epidural space.  Procedure Details: After squaring off the end-plates to get a true AP view, the C-arm was positioned so that an oblique view of the foramen as noted above was visualized. The target area is just inferior to the "nose of the scotty dog" or sub pedicular. The soft tissues overlying this structure were infiltrated with 2-3 ml. of 1% Lidocaine without Epinephrine.  The spinal needle was inserted toward the target using a "trajectory" view along the fluoroscope beam.  Under AP and lateral visualization, the needle was advanced so it did not puncture dura and was located close the 6 O'Clock position of the pedical in AP tracterory. Biplanar projections were used to confirm position. Aspiration was confirmed to be negative for CSF and/or blood. A 1-2 ml. volume of Isovue-250 was injected and flow of contrast was noted at each level. Radiographs were obtained for documentation purposes.   After attaining the desired flow of contrast documented above, a 0.5 to 1.0 ml test dose of  0.25% Marcaine was injected into each respective transforaminal space.  The patient was observed for 90 seconds post injection.  After no sensory deficits were reported, and normal lower extremity motor function was noted,   the above injectate was administered so that equal amounts of the injectate were placed at each foramen (level) into the transforaminal epidural space.   Additional Comments:  The patient tolerated the procedure well Dressing: 2 x 2 sterile gauze and Band-Aid    Post-procedure details: Patient was observed during the procedure. Post-procedure instructions were reviewed.  Patient left the clinic in stable condition.

## 2020-02-27 ENCOUNTER — Ambulatory Visit: Payer: Medicare Other | Admitting: Family Medicine

## 2020-03-02 ENCOUNTER — Encounter: Payer: Self-pay | Admitting: Physical Medicine and Rehabilitation

## 2020-03-02 ENCOUNTER — Other Ambulatory Visit: Payer: Self-pay

## 2020-03-02 ENCOUNTER — Ambulatory Visit: Payer: Self-pay

## 2020-03-02 ENCOUNTER — Ambulatory Visit (INDEPENDENT_AMBULATORY_CARE_PROVIDER_SITE_OTHER): Payer: Medicare Other | Admitting: Physical Medicine and Rehabilitation

## 2020-03-02 VITALS — BP 132/69 | HR 72 | Ht 71.0 in | Wt 215.0 lb

## 2020-03-02 DIAGNOSIS — M48062 Spinal stenosis, lumbar region with neurogenic claudication: Secondary | ICD-10-CM

## 2020-03-02 DIAGNOSIS — M5416 Radiculopathy, lumbar region: Secondary | ICD-10-CM

## 2020-03-02 MED ORDER — METHYLPREDNISOLONE ACETATE 80 MG/ML IJ SUSP
40.0000 mg | Freq: Once | INTRAMUSCULAR | Status: AC
Start: 2020-03-02 — End: 2020-03-02
  Administered 2020-03-02: 40 mg

## 2020-03-02 NOTE — Progress Notes (Signed)
Adrian Cox - 59 y.o. male MRN [id number]  Date of birth: 05/14/1961  Office Visit Note: Visit Date: 03/02/2020 PCP: Jinny Sanders, MD Referred by: Jinny Sanders, MD  Subjective: Chief Complaint  Patient presents with  . Lower Back - Pain    Injection   HPI:  Adrian Cox is a 59 y.o. male who comes in today for planned repeat Bilateral L4-L5 lumbar epidural steroid injection with fluoroscopic guidance.  The patient has failed conservative care including home exercise, medications, time and activity modification.  This injection will be diagnostic and hopefully therapeutic. He did receive more than 60% relief. Please see requesting physician notes for further details and justification.   ROS Otherwise per HPI.  Assessment & Plan: Visit Diagnoses:  1. Lumbar radiculopathy   2. Spinal stenosis of lumbar region with neurogenic claudication     Plan: No additional findings.   Meds & Orders:  Meds ordered this encounter  Medications  . methylPREDNISolone acetate (DEPO-MEDROL) injection 40 mg    Orders Placed This Encounter  Procedures  . XR C-ARM NO REPORT  . Epidural Steroid injection    Follow-up: Return if symptoms worsen or fail to improve.   Procedures: No procedures performed  Lumbosacral Transforaminal Epidural Steroid Injection - Sub-Pedicular Approach with Fluoroscopic Guidance  [name]      Date of Birth: 11/06/1960 MRN: [id number] PCP: Jinny Sanders, MD      Visit Date: 03/02/2020   Universal Protocol:    Date/Time: 03/02/2020  Consent Given By: the patient  Position: PRONE  Additional Comments: Vital signs were monitored before and after the procedure. Patient was prepped and draped in the usual sterile fashion. The correct patient, procedure, and site was verified.   Injection Procedure Details:  Procedure Site One Meds Administered:  Meds ordered this encounter  Medications  . methylPREDNISolone acetate  (DEPO-MEDROL) injection 40 mg    Laterality: Bilateral  Location/Site:  L4-L5  Needle size: 22 G  Needle type: Spinal  Needle Placement: Transforaminal  Findings:    -Comments: Excellent flow of contrast along the nerve and into the epidural space.  Procedure Details: After squaring off the end-plates to get a true AP view, the C-arm was positioned so that an oblique view of the foramen as noted above was visualized. The target area is just inferior to the "nose of the scotty dog" or sub pedicular. The soft tissues overlying this structure were infiltrated with 2-3 ml. of 1% Lidocaine without Epinephrine.  The spinal needle was inserted toward the target using a "trajectory" view along the fluoroscope beam.  Under AP and lateral visualization, the needle was advanced so it did not puncture dura and was located close the 6 O'Clock position of the pedical in AP tracterory. Biplanar projections were used to confirm position. Aspiration was confirmed to be negative for CSF and/or blood. A 1-2 ml. volume of Isovue-250 was injected and flow of contrast was noted at each level. Radiographs were obtained for documentation purposes.   After attaining the desired flow of contrast documented above, a 0.5 to 1.0 ml test dose of 0.25% Marcaine was injected into each respective transforaminal space.  The patient was observed for 90 seconds post injection.  After no sensory deficits were reported, and normal lower extremity motor function was noted,   the above injectate was administered so that equal amounts of the injectate were placed at each foramen (level) into the transforaminal epidural space.   Additional  Comments:  The patient tolerated the procedure well Dressing: 2 x 2 sterile gauze and Band-Aid    Post-procedure details: Patient was observed during the procedure. Post-procedure instructions were reviewed.  Patient left the clinic in stable condition.      Clinical  History: Severe low back pain radiating to bilateral lower extremities for many years. History of cervical and lumbar spine surgery.  EXAM: MRI LUMBAR SPINE WITHOUT AND WITH CONTRAST  TECHNIQUE: Multiplanar and multiecho pulse sequences of the lumbar spine were obtained without and with intravenous contrast.  CONTRAST:  80mL MULTIHANCE GADOBENATE DIMEGLUMINE 529 MG/ML IV SOLN  COMPARISON:  Lumbar spine radiographs November 09, 2017  FINDINGS: SEGMENTATION: For the purposes of this report, the last well-formed intervertebral disc is reported as L5-S1.  ALIGNMENT: Grade 1 L4-5 anterolisthesis and grade 1 L5-S1 retrolisthesis.  VERTEBRAE: Vertebral bodies are intact. Moderate L4-5, moderate to severe L5-S1 disc height loss with disc desiccation compatible with degenerative discs. Mild associated chronic discogenic endplate changes. No acute abnormal bone marrow signal. No suspicious osseous or disc enhancement. L2 superior endplate old Schmorl's node.  CONUS MEDULLARIS AND CAUDA EQUINA: Conus medullaris terminates at T12-L1 and demonstrates normal morphology and signal characteristics. Cauda equina is normal. No abnormal cord, leptomeningeal or epidural enhancement.  PARASPINAL AND OTHER SOFT TISSUES: Included prevertebral and paraspinal soft tissues are normal.  DISC LEVELS:  T12-L1, L1-2, L2-3: No disc bulge, canal stenosis nor neural foraminal narrowing.  L3-4: Annular bulging, small RIGHT extraforaminal disc protrusion. Minimal facet arthropathy and ligamentum flavum redundancy without canal stenosis. Mild RIGHT greater than LEFT neural foraminal narrowing.  L4-5: Anterolisthesis. Moderate broad-based disc bulge. Moderate to severe facet arthropathy with trace reactive effusions. Moderate to severe canal stenosis. Moderate to severe RIGHT, moderate LEFT neural foraminal narrowing. Peripherally enhancing bilateral facet synovial cysts within bilateral  paraspinal soft tissues measuring to 14 mm.  L5-S1: Status post LEFT laminectomy. Nonenhancing 7 x 11 x 12 mm cyst within laminectomy surgical bed. Retrolisthesis. Moderate RIGHT central disc protrusion with enhancing annular fissure/discectomy defect. No canal stenosis though, granulation tissue and narrowing the lateral recesses may affect the traversing S1 nerves. Minimal facet arthropathy. No canal stenosis.  IMPRESSION: 1. Status post LEFT L5 hemilaminectomy. Grade 1 L4-5 anterolisthesis and grade 1 L5-S1 retrolisthesis. No acute osseous process. 2. 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 L5-S1 lateral recess stenosis and granulation tissue may affect the traversing S1 nerves. 3. Neural foraminal narrowing L3-4 through L5-S1: Moderate to severe on the RIGHT at L4-5.   Electronically Signed   By: Awilda Metro M.D.   On: 11/20/2017 22:39  --- MRI CERVICAL SPINE WITHOUT CONTRAST    IMPRESSION:  1. The symptomatic abnormality is favored to be acute exacerbation  of chronic facet joint arthritis on the right at C2-C3. Marrow edema  there is superimposed on chronic disc and posterior element  degeneration which results in mild spinal stenosis and up to  moderate right C3 foraminal stenosis.  2. Chronic C6-C7 ACDF with solid arthrodesis.  3. Advanced disc and endplate degeneration C3-C4 through C5-C6 with  multifactorial spinal stenosis at each level. Up to moderate  associated spinal cord mass effect, but no cord signal abnormality.  Severe bilateral C4, C5 and left greater than right C6 foraminal  stenosis.  4. C7-T1 degeneration with mild spinal stenosis, severe left C8  foraminal stenosis.      Electronically Signed  By: Odessa Fleming M.D.  On: 07/21/2019 08:33     Objective:  VS:  HT:5\' 11"  (  180.3 cm)   WT:215 lb (97.5 kg)  BMI:30    BP:132/69  HR:72bpm  TEMP: ( )  RESP:  Physical Exam Vitals and nursing note reviewed.  Constitutional:       General: He is not in acute distress.    Appearance: Normal appearance. He is not ill-appearing.  HENT:     Head: Normocephalic and atraumatic.     Right Ear: External ear normal.     Left Ear: External ear normal.  Eyes:     Extraocular Movements: Extraocular movements intact.  Cardiovascular:     Rate and Rhythm: Normal rate.     Pulses: Normal pulses.  Abdominal:     General: There is no distension.     Palpations: Abdomen is soft.  Musculoskeletal:        General: No signs of injury.     Cervical back: Neck supple. Tenderness present. No rigidity.     Right lower leg: No edema.     Left lower leg: No edema.     Comments: Patient has good strength in the upper extremities with 5 out of 5 strength in wrist extension long finger flexion APB.  No intrinsic hand muscle atrophy.  Negative Hoffmann's test.  Lymphadenopathy:     Cervical: No cervical adenopathy.  Skin:    Findings: No erythema or rash.  Neurological:     General: No focal deficit present.     Mental Status: He is alert and oriented to person, place, and time.     Sensory: No sensory deficit.     Motor: No weakness or abnormal muscle tone.     Coordination: Coordination normal.  Psychiatric:        Mood and Affect: Mood normal.        Behavior: Behavior normal.      Imaging: No results found.

## 2020-03-02 NOTE — Procedures (Signed)
Lumbosacral Transforaminal Epidural Steroid Injection - Sub-Pedicular Approach with Fluoroscopic Guidance  [name]      Date of Birth: 07-Feb-1961 MRN: [id number] PCP: Excell Seltzer, MD      Visit Date: 03/02/2020   Universal Protocol:    Date/Time: 03/02/2020  Consent Given By: the patient  Position: PRONE  Additional Comments: Vital signs were monitored before and after the procedure. Patient was prepped and draped in the usual sterile fashion. The correct patient, procedure, and site was verified.   Injection Procedure Details:  Procedure Site One Meds Administered:  Meds ordered this encounter  Medications  . methylPREDNISolone acetate (DEPO-MEDROL) injection 40 mg    Laterality: Bilateral  Location/Site:  L4-L5  Needle size: 22 G  Needle type: Spinal  Needle Placement: Transforaminal  Findings:    -Comments: Excellent flow of contrast along the nerve and into the epidural space.  Procedure Details: After squaring off the end-plates to get a true AP view, the C-arm was positioned so that an oblique view of the foramen as noted above was visualized. The target area is just inferior to the "nose of the scotty dog" or sub pedicular. The soft tissues overlying this structure were infiltrated with 2-3 ml. of 1% Lidocaine without Epinephrine.  The spinal needle was inserted toward the target using a "trajectory" view along the fluoroscope beam.  Under AP and lateral visualization, the needle was advanced so it did not puncture dura and was located close the 6 O'Clock position of the pedical in AP tracterory. Biplanar projections were used to confirm position. Aspiration was confirmed to be negative for CSF and/or blood. A 1-2 ml. volume of Isovue-250 was injected and flow of contrast was noted at each level. Radiographs were obtained for documentation purposes.   After attaining the desired flow of contrast documented above, a 0.5 to 1.0 ml test dose of  0.25% Marcaine was injected into each respective transforaminal space.  The patient was observed for 90 seconds post injection.  After no sensory deficits were reported, and normal lower extremity motor function was noted,   the above injectate was administered so that equal amounts of the injectate were placed at each foramen (level) into the transforaminal epidural space.   Additional Comments:  The patient tolerated the procedure well Dressing: 2 x 2 sterile gauze and Band-Aid    Post-procedure details: Patient was observed during the procedure. Post-procedure instructions were reviewed.  Patient left the clinic in stable condition.

## 2020-03-02 NOTE — Progress Notes (Signed)
Patient presents today for lower back injection. His last lower back injection was on 11/06/2019. He states that his back has been hurting for several weeks now, but feeling pretty good today. The pain is located in his lower back and radiates into both legs, but worse on the right side. No numbness or tingling. The pain is overall worse with cold weather, but also worse with rest and sitting. He gets relief when walking. He takes Lyrica three times daily and oxycodone as needed.   Numeric Pain Rating Scale and Functional Assessment Average Pain 3   In the last MONTH (on 0-10 scale) has pain interfered with the following?  1. General activity like being  able to carry out your everyday physical activities such as walking, climbing stairs, carrying groceries, or moving a chair?  Rating(9)   +Driver- yes -BT- NO  -Dye Allergies- NO

## 2020-03-03 ENCOUNTER — Other Ambulatory Visit: Payer: Self-pay | Admitting: Family Medicine

## 2020-03-03 ENCOUNTER — Other Ambulatory Visit: Payer: Self-pay

## 2020-03-03 MED ORDER — TADALAFIL 20 MG PO TABS: 20.0000 mg | ORAL_TABLET | Freq: Every day | ORAL | 2 refills | Status: DC | PRN

## 2020-03-03 MED ORDER — PREGABALIN 150 MG PO CAPS: 150.0000 mg | ORAL_CAPSULE | Freq: Three times a day (TID) | ORAL | 0 refills | Status: DC

## 2020-03-03 NOTE — Telephone Encounter (Signed)
Last office visit 04/23/20201 with Dr. Milinda Antis for back spasms.  Last refilled Lyrica 12/31/2019 for #90 with no refills.  Patient should have refill for oxycodone available at his pharmacy.  Refilled 01/31/2020 for #30 x 3 months.  I let Adrian Cox know this via his MyChart.  Ok to refill the Lyrica?  Please deny oxycodone refills.

## 2020-03-09 ENCOUNTER — Other Ambulatory Visit: Payer: Self-pay | Admitting: Family Medicine

## 2020-04-13 ENCOUNTER — Other Ambulatory Visit: Payer: Self-pay | Admitting: Family Medicine

## 2020-04-13 NOTE — Telephone Encounter (Signed)
Last office visit 02/07/2020 with Dr. Milinda Antis for Back Spasms.  Last refilled 03/03/2020 for #90 with no refills.  No future appointments.

## 2020-05-07 ENCOUNTER — Encounter: Payer: Self-pay | Admitting: Family Medicine

## 2020-05-07 ENCOUNTER — Ambulatory Visit (INDEPENDENT_AMBULATORY_CARE_PROVIDER_SITE_OTHER): Payer: Medicare Other | Admitting: Family Medicine

## 2020-05-07 ENCOUNTER — Other Ambulatory Visit: Payer: Self-pay

## 2020-05-07 VITALS — BP 140/68 | HR 72 | Temp 98.5°F | Ht 71.5 in | Wt 212.0 lb

## 2020-05-07 DIAGNOSIS — G894 Chronic pain syndrome: Secondary | ICD-10-CM

## 2020-05-07 DIAGNOSIS — M25512 Pain in left shoulder: Secondary | ICD-10-CM | POA: Diagnosis not present

## 2020-05-07 DIAGNOSIS — M5416 Radiculopathy, lumbar region: Secondary | ICD-10-CM

## 2020-05-07 MED ORDER — PREDNISONE 20 MG PO TABS
ORAL_TABLET | ORAL | 0 refills | Status: DC
Start: 2020-05-07 — End: 2020-09-14

## 2020-05-07 NOTE — Assessment & Plan Note (Signed)
Likely OA vs rotator cuff injury.  Treat with anti-inflammatories. INfo on home PT given.  Follow up if not improving a expected in 2 weeks.. consider X-ray at that time.

## 2020-05-07 NOTE — Assessment & Plan Note (Signed)
Acute flare on chronic sciatica.. radiation to left leg.4 Treat with prednisone taper, gentle stretching and follow up for re-eval in 2 weeks if not better. May need to consider referral back to Dr. Alvester Morin. Oxycodone daily for pain control.

## 2020-05-07 NOTE — Patient Instructions (Signed)
Complete prednisone taper.  Start heat on low back. Start home physical therapy for shoulder and low back. Follow up in 2 weeks if not improving.

## 2020-05-07 NOTE — Assessment & Plan Note (Signed)
PDMP reviewed. NO red flags 

## 2020-05-07 NOTE — Progress Notes (Signed)
Chief Complaint  Patient presents with  . Leg Pain    Left-Sciatica  . Shoulder Pain    Left    History of Present Illness: HPI   59 yearold male presents with new onset pain  In left leg, low back as well as in left shoulder. Worse in last few weeks.  Pain in left buttock, radiates to left calf.  Occ left leg given out on him. He is using cane.   10 of 10 pain. No change in activity, no fall.   He has a history of  Chronic low back pain and past laminectomy.  He is on chronic narcotic for pain management. Oxycodone 5 mg  Daily prn.  Last fill 03/2020. #30, has 10 left.not due for refill yet. Using 1 daily or less. Also on lyrica 150 TID.   Had steroid injection 03/02/2020.. helped   New onset left shoulder pain, lateral pain x 1 month. He notes more pain. Pain with abduction , int and ext rotation.  Was working out , but has stopped now.  This visit occurred during the SARS-CoV-2 public health emergency.  Safety protocols were in place, including screening questions prior to the visit, additional usage of staff PPE, and extensive cleaning of exam room while observing appropriate contact time as indicated for disinfecting solutions.   COVID 19 screen:  No recent travel or known exposure to COVID19 The patient denies respiratory symptoms of COVID 19 at this time. The importance of social distancing was discussed today.     Review of Systems  Constitutional: Negative for chills and fever.  HENT: Negative for congestion and ear pain.   Eyes: Negative for pain and redness.  Respiratory: Negative for cough and shortness of breath.   Cardiovascular: Negative for chest pain, palpitations and leg swelling.  Gastrointestinal: Negative for abdominal pain, blood in stool, constipation, diarrhea, nausea and vomiting.  Genitourinary: Negative for dysuria.  Musculoskeletal: Negative for falls and myalgias.  Skin: Negative for rash.  Neurological: Negative for dizziness.   Psychiatric/Behavioral: Negative for depression. The patient is not nervous/anxious.       Past Medical History:  Diagnosis Date  . Depression    bipolar  . Diverticulosis   . Hyperlipidemia   . Hypertension   . Mold exposure   . Sciatic nerve pain     reports that he has never smoked. He has never used smokeless tobacco. He reports current drug use. Drug: Marijuana. He reports that he does not drink alcohol.   Current Outpatient Medications:  .  atorvastatin (LIPITOR) 10 MG tablet, Take 1 tablet (10 mg total) by mouth daily., Disp: 90 tablet, Rfl: 3 .  Cholecalciferol (VITAMIN D) 50 MCG (2000 UT) tablet, Take 2,000 Units by mouth daily., Disp: , Rfl:  .  cyclobenzaprine (FLEXERIL) 10 MG tablet, Take 1 tablet (10 mg total) by mouth at bedtime as needed for muscle spasms., Disp: 15 tablet, Rfl: 0 .  FIBER PO, Take 2 tablets by mouth 3 (three) times daily. gummies, Disp: , Rfl:  .  lisinopril (ZESTRIL) 10 MG tablet, Take 2 tablets (20 mg total) by mouth daily., Disp: 180 tablet, Rfl: 1 .  Multiple Vitamins-Minerals (MULTIVITAMIN ADULT PO), Take 2 tablets by mouth daily. gummies, Disp: , Rfl:  .  oxycodone (OXY-IR) 5 MG capsule, Take 1 capsule (5 mg total) by mouth daily as needed for pain. Do not fill until 04/01/2020, Disp: 30 capsule, Rfl: 0 .  pregabalin (LYRICA) 150 MG capsule, TAKE 1 CAPSULE (150  MG TOTAL) BY MOUTH 3 (THREE) TIMES DAILY., Disp: 90 capsule, Rfl: 0 .  tadalafil (CIALIS) 20 MG tablet, Take 1 tablet (20 mg total) by mouth daily as needed for erectile dysfunction., Disp: 10 tablet, Rfl: 2   Observations/Objective: Blood pressure (!) 140/68, pulse 72, temperature 98.5 F (36.9 C), temperature source Temporal, height 5' 11.5" (1.816 m), weight (!) 212 lb (96.2 kg), SpO2 94 %.  Physical Exam Constitutional:      Appearance: He is well-developed.  HENT:     Head: Normocephalic.     Right Ear: Hearing normal.     Left Ear: Hearing normal.     Nose: Nose normal.  Neck:      Thyroid: No thyroid mass or thyromegaly.     Vascular: No carotid bruit.     Trachea: Trachea normal.  Cardiovascular:     Rate and Rhythm: Normal rate and regular rhythm.     Pulses: Normal pulses.     Heart sounds: Heart sounds not distant. No murmur heard.  No friction rub. No gallop.      Comments: No peripheral edema Pulmonary:     Effort: Pulmonary effort is normal. No respiratory distress.     Breath sounds: Normal breath sounds.  Musculoskeletal:     Right shoulder: Normal.     Left shoulder: Tenderness present. No bony tenderness. Decreased range of motion.     Lumbar back: Tenderness present. No bony tenderness. Positive left straight leg raise test.     Comments: Pain with abduction and internal rotation  neg spurling  pain subacromially.   TTP in left sciatic notch  Skin:    General: Skin is warm and dry.     Findings: No rash.  Neurological:     Mental Status: He is oriented to person, place, and time.     Cranial Nerves: Cranial nerves are intact.     Sensory: Sensation is intact.     Motor: Motor function is intact.  Psychiatric:        Speech: Speech normal.        Behavior: Behavior normal.        Thought Content: Thought content normal.      Assessment and Plan   Lumbar radiculopathy Acute flare on chronic sciatica.. radiation to left leg.4 Treat with prednisone taper, gentle stretching and follow up for re-eval in 2 weeks if not better. May need to consider referral back to Dr. Alvester Morin. Oxycodone daily for pain control.  Chronic pain syndrome PDMP reviewed. NO red flags.  Acute pain of left shoulder Likely OA vs rotator cuff injury.  Treat with anti-inflammatories. INfo on home PT given.  Follow up if not improving a expected in 2 weeks.. consider X-ray at that time.     Kerby Nora, MD

## 2020-05-13 MED ORDER — OXYCODONE HCL 5 MG PO CAPS: 5.0000 mg | ORAL_CAPSULE | Freq: Every day | ORAL | 0 refills | Status: DC | PRN

## 2020-05-13 NOTE — Telephone Encounter (Signed)
PDMP reviewed. Pt uptodate with [employer] contract and UDS Refilled x 3 months. Pt to follow up in 3 months for pain management visit.

## 2020-05-14 ENCOUNTER — Other Ambulatory Visit: Payer: Self-pay | Admitting: Family Medicine

## 2020-05-21 ENCOUNTER — Other Ambulatory Visit: Payer: Self-pay | Admitting: Family Medicine

## 2020-05-22 NOTE — Telephone Encounter (Signed)
Last office visit 05/07/2020 for leg/shoulder pain.  Last refilled 04/14/2020 for #90 with no refills. No future appointments with PCP.

## 2020-05-24 ENCOUNTER — Other Ambulatory Visit: Payer: Self-pay

## 2020-06-02 ENCOUNTER — Other Ambulatory Visit: Payer: Self-pay

## 2020-06-02 ENCOUNTER — Ambulatory Visit (INDEPENDENT_AMBULATORY_CARE_PROVIDER_SITE_OTHER): Payer: Medicare Other | Admitting: Physical Medicine and Rehabilitation

## 2020-06-02 ENCOUNTER — Encounter: Payer: Self-pay | Admitting: Physical Medicine and Rehabilitation

## 2020-06-02 ENCOUNTER — Telehealth: Payer: Self-pay | Admitting: Family Medicine

## 2020-06-02 VITALS — BP 161/78 | HR 63

## 2020-06-02 DIAGNOSIS — G894 Chronic pain syndrome: Secondary | ICD-10-CM

## 2020-06-02 DIAGNOSIS — M961 Postlaminectomy syndrome, not elsewhere classified: Secondary | ICD-10-CM

## 2020-06-02 DIAGNOSIS — M48062 Spinal stenosis, lumbar region with neurogenic claudication: Secondary | ICD-10-CM | POA: Diagnosis not present

## 2020-06-02 DIAGNOSIS — M5416 Radiculopathy, lumbar region: Secondary | ICD-10-CM

## 2020-06-02 DIAGNOSIS — M5116 Intervertebral disc disorders with radiculopathy, lumbar region: Secondary | ICD-10-CM

## 2020-06-02 NOTE — Progress Notes (Signed)
Left sided low back and buttock pain. Stiffness across low back. States that it is in a different area than his usual pain.  Numeric Pain Rating Scale and Functional Assessment Average Pain 10 Pain Right Now 10 My pain is constant and aching Pain is worse with: sitting Pain improves with: medication and standing, walking   In the last MONTH (on 0-10 scale) has pain interfered with the following?  1. General activity like being  able to carry out your everyday physical activities such as walking, climbing stairs, carrying groceries, or moving a chair?  Rating(10)  2. Relation with others like being able to carry out your usual social activities and roles such as  activities at home, at work and in your community. Rating(10)  3. Enjoyment of life such that you have  been bothered by emotional problems such as feeling anxious, depressed or irritable?  Rating(10)

## 2020-06-02 NOTE — Telephone Encounter (Signed)
He can try lidocaine patch over the counter.

## 2020-06-02 NOTE — Telephone Encounter (Signed)
Pt called stating he is going to see dr Alvester Morin 8/19 for injections for lower back pain  He saw dr newton this morning.  Pt is wanting to know what he can do for lower back pain until he can see dr Alvester Morin on thursday

## 2020-06-03 ENCOUNTER — Other Ambulatory Visit: Payer: Self-pay | Admitting: Family Medicine

## 2020-06-03 ENCOUNTER — Encounter: Payer: Self-pay | Admitting: Physical Medicine and Rehabilitation

## 2020-06-03 DIAGNOSIS — M48062 Spinal stenosis, lumbar region with neurogenic claudication: Secondary | ICD-10-CM | POA: Insufficient documentation

## 2020-06-03 NOTE — Telephone Encounter (Signed)
Left message for Mr. Cortez to try OTC Lidocaine patch on his back until his injection on Thursday 06/04/2020.

## 2020-06-03 NOTE — Progress Notes (Signed)
DUAYNE Cox - 59 y.o. male MRN [id number]  Date of birth: Mar 07, 1961  Office Visit Note: Visit Date: 06/02/2020 PCP: Excell Seltzer, MD Referred by: Excell Seltzer, MD  Subjective: Chief Complaint  Patient presents with  . Lower Back - Pain   HPI: Adrian Cox is a 59 y.o. male who comes in today For evaluation management of chronic worsening low back pain and typically right sided hip and leg pain.  He has done well with intermittent epidural injections at L4 given the level of stenosis there as well as prior history of lumbar laminectomy at L5-S1.  MRI from 2019 reviewed again today with him.  He comes in today with newer onset of left radicular pain in an L5 distribution.  He was worried because it had been always right-sided over the last year or so but now it was left-sided.  He reports no specific new injury.  No focal weakness etc.  He is worried about the pain level.  He continues take oxycodone through his primary care physician Dr. Ermalene Searing.  He is also on Lyrica.  He has done activity modifications and continues on home exercise program but has had really inability to do a lot of activity to the pain.  Of course rates pain as 10 out of 10.  He says it is constant aching and it is really hard to get him to tell us when it is really worse but it did seem to be really worse with prolonged standing but it can get worse with sitting.  Again no focal weakness or bowel bladder changes.  Review of Systems  Musculoskeletal: Positive for back pain.       Left hip and leg pain  All other systems reviewed and are negative.  Otherwise per HPI.  Assessment & Plan: Visit Diagnoses:  1. Lumbar radiculopathy   2. Radiculopathy due to lumbar intervertebral disc disorder   3. Spinal stenosis of lumbar region with neurogenic claudication   4. Post laminectomy syndrome   5. Chronic pain syndrome     Plan: Findings:  Several weeks of worsening left radicular leg pain in a pretty classic L5  distribution.  This is in the setting of someone with prior lumbar laminectomy history was still pretty significant disease at the level of the laminectomy both right and left.  He also has significant stenosis at L4-5 which could cause either side to hurt as well.  He is maintained chronically on oxycodone by his primary care physician as well as Lyrica.  He has done well in the past with intermittent epidural injection.  Exam is really nonfocal and I do think he is having a left radicular leg pain could be related to change in disc protrusion but can just be worsening symptoms and flareup of her radicular pain.  We will get him back in pretty quickly for diagnostic and hopefully therapeutic left L5 transforaminal epidural steroid injection.  Depending on relief would look at may be updating MRI of the lumbar spine do to the severity of his symptoms.  He still would be a good candidate unfortunately for possible decompression of both levels.  Consideration should be given to spinal cord stimulator trial as well.    Meds & Orders: No orders of the defined types were placed in this encounter.  No orders of the defined types were placed in this encounter.   Follow-up: Return for Left L5 transforaminal steroid injection.   Procedures: No procedures performed  No  notes on file   Clinical History: Severe low back pain radiating to bilateral lower extremities for many years. History of cervical and lumbar spine surgery.  EXAM: MRI LUMBAR SPINE WITHOUT AND WITH CONTRAST  TECHNIQUE: Multiplanar and multiecho pulse sequences of the lumbar spine were obtained without and with intravenous contrast.  CONTRAST:  68mL MULTIHANCE GADOBENATE DIMEGLUMINE 529 MG/ML IV SOLN  COMPARISON:  Lumbar spine radiographs November 09, 2017  FINDINGS: SEGMENTATION: For the purposes of this report, the last well-formed intervertebral disc is reported as L5-S1.  ALIGNMENT: Grade 1 L4-5 anterolisthesis and grade  1 L5-S1 retrolisthesis.  VERTEBRAE: Vertebral bodies are intact. Moderate L4-5, moderate to severe L5-S1 disc height loss with disc desiccation compatible with degenerative discs. Mild associated chronic discogenic endplate changes. No acute abnormal bone marrow signal. No suspicious osseous or disc enhancement. L2 superior endplate old Schmorl's node.  CONUS MEDULLARIS AND CAUDA EQUINA: Conus medullaris terminates at T12-L1 and demonstrates normal morphology and signal characteristics. Cauda equina is normal. No abnormal cord, leptomeningeal or epidural enhancement.  PARASPINAL AND OTHER SOFT TISSUES: Included prevertebral and paraspinal soft tissues are normal.  DISC LEVELS:  T12-L1, L1-2, L2-3: No disc bulge, canal stenosis nor neural foraminal narrowing.  L3-4: Annular bulging, small RIGHT extraforaminal disc protrusion. Minimal facet arthropathy and ligamentum flavum redundancy without canal stenosis. Mild RIGHT greater than LEFT neural foraminal narrowing.  L4-5: Anterolisthesis. Moderate broad-based disc bulge. Moderate to severe facet arthropathy with trace reactive effusions. Moderate to severe canal stenosis. Moderate to severe RIGHT, moderate LEFT neural foraminal narrowing. Peripherally enhancing bilateral facet synovial cysts within bilateral paraspinal soft tissues measuring to 14 mm.  L5-S1: Status post LEFT laminectomy. Nonenhancing 7 x 11 x 12 mm cyst within laminectomy surgical bed. Retrolisthesis. Moderate RIGHT central disc protrusion with enhancing annular fissure/discectomy defect. No canal stenosis though, granulation tissue and narrowing the lateral recesses may affect the traversing S1 nerves. Minimal facet arthropathy. No canal stenosis.  IMPRESSION: 1. Status post LEFT L5 hemilaminectomy. Grade 1 L4-5 anterolisthesis and grade 1 L5-S1 retrolisthesis. No acute osseous process. 2. 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 L5-S1 lateral  recess stenosis and granulation tissue may affect the traversing S1 nerves. 3. Neural foraminal narrowing L3-4 through L5-S1: Moderate to severe on the RIGHT at L4-5.   Electronically Signed   By: Awilda Metro M.D.   On: 11/20/2017 22:39  --- MRI CERVICAL SPINE WITHOUT CONTRAST    IMPRESSION:  1. The symptomatic abnormality is favored to be acute exacerbation  of chronic facet joint arthritis on the right at C2-C3. Marrow edema  there is superimposed on chronic disc and posterior element  degeneration which results in mild spinal stenosis and up to  moderate right C3 foraminal stenosis.  2. Chronic C6-C7 ACDF with solid arthrodesis.  3. Advanced disc and endplate degeneration C3-C4 through C5-C6 with  multifactorial spinal stenosis at each level. Up to moderate  associated spinal cord mass effect, but no cord signal abnormality.  Severe bilateral C4, C5 and left greater than right C6 foraminal  stenosis.  4. C7-T1 degeneration with mild spinal stenosis, severe left C8  foraminal stenosis.      Electronically Signed  By: Odessa Fleming M.D.  On: 07/21/2019 08:33   He reports that he has never smoked. He has never used smokeless tobacco. No results for input(s): HGBA1C, LABURIC in the last 8760 hours.  Objective:  VS:  HT:    WT:   BMI:     BP:(!) 161/78  HR:63bpm  TEMP: ( )  RESP:  Physical Exam Vitals and nursing note reviewed.  Constitutional:      General: He is not in acute distress.    Appearance: Normal appearance. He is well-developed.  HENT:     Head: Normocephalic and atraumatic.  Eyes:     Conjunctiva/sclera: Conjunctivae normal.     Pupils: Pupils are equal, round, and reactive to light.  Cardiovascular:     Rate and Rhythm: Normal rate.     Pulses: Normal pulses.     Heart sounds: Normal heart sounds.  Pulmonary:     Effort: Pulmonary effort is normal. No respiratory distress.  Musculoskeletal:     Cervical back: Normal range of motion and  neck supple. No rigidity.     Right lower leg: No edema.     Left lower leg: No edema.     Comments: Patient with increased pain going from sit to stand in full extension.  He has pain with forward flexion.  Equivocally positive left slump test.  Good distal strength with dorsiflexion plantarflexion EHL.  No clonus.  Skin:    General: Skin is warm and dry.     Findings: No erythema or rash.  Neurological:     General: No focal deficit present.     Mental Status: He is alert and oriented to person, place, and time.     Sensory: No sensory deficit.     Motor: No weakness.     Coordination: Coordination normal.     Gait: Gait abnormal.  Psychiatric:        Mood and Affect: Mood normal.        Behavior: Behavior normal.     Ortho Exam  Imaging: No results found.  Past Medical/Family/Surgical/Social History: Medications & Allergies reviewed per EMR, new medications updated. Patient Active Problem List   Diagnosis Date Noted  . Spinal stenosis of lumbar region with neurogenic claudication 06/03/2020  . Acute pain of left shoulder 05/07/2020  . Microscopic hematuria 02/07/2020  . Erectile dysfunction 01/31/2020  . Cervical spondylosis without myelopathy 10/31/2019  . Post laminectomy syndrome 10/31/2019  . Constipation due to opioid therapy 09/20/2019  . Excessive sweating 09/20/2019  . Left leg pain 02/28/2019  . Elevated liver function tests 11/20/2018  . Chronic pain syndrome 11/20/2018  . Cervical radiculopathy 09/05/2018  . Lumbar radiculopathy 08/17/2018  . Pain in right thigh 03/27/2018  . Lateral knee pain, right 03/27/2018  . 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 Chronic low back pain with sciatica 11/09/2017  . Osteoarthritis of spine 02/23/2017  . Chronic right shoulder pain 01/09/2017  . Essential hypertension 08/17/2016  . Bipolar disorder (HCC) 08/17/2016  . Hyperlipidemia 08/17/2016   Past Medical History:  Diagnosis Date  . Depression    bipolar  .  Diverticulosis   . Hyperlipidemia   . Hypertension   . Mold exposure   . Sciatic nerve pain    Family History  Problem Relation Age of Onset  . Colon cancer Mother [id number]  . Hypertension Mother   . Alzheimer's disease Father   . Hypertension Father   . Hypertension Sister   . Diabetes Sister   . Diabetes Maternal Grandmother   . Hypertension Maternal Grandmother   . Hypertension Sister   . Diabetes Sister    Past Surgical History:  Procedure Laterality Date  . BACK SURGERY    . CERVICAL SPINE SURGERY  2003   fusion  . KNEE ARTHROSCOPY Right   . KNEE ARTHROSCOPY Left   . SPINE  SURGERY     Social History   Occupational History  . Occupation: retired  Tobacco Use  . Smoking status: Never Smoker  . Smokeless tobacco: Never Used  Vaping Use  . Vaping Use: Never used  Substance and Sexual Activity  . Alcohol use: No  . Drug use: Yes    Types: Marijuana    Comment: Hx marijuana use - none x 2 yrs  . Sexual activity: Yes

## 2020-06-04 ENCOUNTER — Ambulatory Visit (INDEPENDENT_AMBULATORY_CARE_PROVIDER_SITE_OTHER): Payer: Medicare Other | Admitting: Physical Medicine and Rehabilitation

## 2020-06-04 ENCOUNTER — Encounter: Payer: Self-pay | Admitting: Physical Medicine and Rehabilitation

## 2020-06-04 ENCOUNTER — Other Ambulatory Visit: Payer: Self-pay

## 2020-06-04 ENCOUNTER — Ambulatory Visit: Payer: Self-pay

## 2020-06-04 VITALS — BP 151/90 | HR 91

## 2020-06-04 DIAGNOSIS — M5416 Radiculopathy, lumbar region: Secondary | ICD-10-CM | POA: Diagnosis not present

## 2020-06-04 MED ORDER — METHYLPREDNISOLONE ACETATE 80 MG/ML IJ SUSP
80.0000 mg | Freq: Once | INTRAMUSCULAR | Status: AC
Start: 2020-06-04 — End: 2020-06-04
  Administered 2020-06-04: 80 mg

## 2020-06-04 NOTE — Progress Notes (Signed)
Pt states lower back pain that travels down both leg pain. Pt states sitting makes the pain worse. Pt state staying active help with sum of the pain. Pt has hx of inj on 03/02/20 pt state it was good till the end of July.   Numeric Pain Rating Scale and Functional Assessment Average Pain 10   In the last MONTH (on 0-10 scale) has pain interfered with the following?  1. General activity like being  able to carry out your everyday physical activities such as walking, climbing stairs, carrying groceries, or moving a chair?  Rating(10)   +Driver, -BT, -Dye Allergies.

## 2020-06-05 NOTE — Progress Notes (Signed)
Adrian Cox - 59 y.o. male MRN [id number]  Date of birth: April 29, 1961  Office Visit Note: Visit Date: 06/04/2020 PCP: Excell Seltzer, MD Referred by: Excell Seltzer, MD  Subjective: Chief Complaint  Patient presents with  . Left Leg - Pain  . Right Leg - Pain  . Lower Back - Pain   HPI:  Adrian Cox is a 59 y.o. male who comes in today at the request of Dr. Naaman Plummer for planned Left L4-L5 Lumbar epidural steroid injection with fluoroscopic guidance.  The patient has failed conservative care including home exercise, medications, time and activity modification.  This injection will be diagnostic and hopefully therapeutic.  Please see requesting physician notes for further details and justification.  In our recent follow-up visit he was having this new onset of left buttock and leg pain that he has had remotely but it increased recently.  We decided at the time to complete L4 transforaminal injection at the level of his stenosis however he is getting more S1 symptoms and prior MRI does show significant disc issues and fibrosis from prior laminectomy on the left at L5-S1.  We decided today to do the S1 transforaminal injection diagnostically and hopefully therapeutically.  Consideration in the future should be to spinal cord stimulator trial.  Neck step would probably be MRI of the lumbar spine if his pain is just no better.   ROS Otherwise per HPI.  Assessment & Plan: Visit Diagnoses:  1. Lumbar radiculopathy     Plan: No additional findings.   Meds & Orders:  Meds ordered this encounter  Medications  . methylPREDNISolone acetate (DEPO-MEDROL) injection 80 mg    Orders Placed This Encounter  Procedures  . XR C-ARM NO REPORT  . Epidural Steroid injection    Follow-up: Return if symptoms worsen or fail to improve.   Procedures: No procedures performed  S1 Lumbosacral Transforaminal Epidural Steroid Injection - Sub-Pedicular Approach with Fluoroscopic Guidance    [name]      Date of Birth: 10/22/1960 MRN: [id number] PCP: Excell Seltzer, MD      Visit Date: 06/04/2020   Universal Protocol:    Date/Time: 08/20/215:52 AM  Consent Given By: the patient  Position:  PRONE  Additional Comments: Vital signs were monitored before and after the procedure. Patient was prepped and draped in the usual sterile fashion. The correct patient, procedure, and site was verified.   Injection Procedure Details:  Procedure Site One Meds Administered:  Meds ordered this encounter  Medications  . methylPREDNISolone acetate (DEPO-MEDROL) injection 80 mg    Laterality: Left  Location/Site:  S1 Foramen   Needle size: 22 ga.  Needle type: Spinal  Needle Placement: Transforaminal  Findings:   -Comments: Excellent flow of contrast along the nerve and into the epidural space.   Procedure Details: After squaring off the sacral end-plate to get a true AP view, the C-arm was positioned so that the best possible view of the S1 foramen was visualized. The soft tissues overlying this structure were infiltrated with 2-3 ml. of 1% Lidocaine without Epinephrine.    The spinal needle was inserted toward the target using a "trajectory" view along the fluoroscope beam.  Under AP and lateral visualization, the needle was advanced so it did not puncture dura. Biplanar projections were used to confirm position. Aspiration was confirmed to be negative for CSF and/or blood. A 1-2 ml. volume of Isovue-250 was injected and flow of contrast was noted at each  level. Radiographs were obtained for documentation purposes.   After attaining the desired flow of contrast documented above, a 0.5 to 1.0 ml test dose of 0.25% Marcaine was injected into each respective transforaminal space.  The patient was observed for 90 seconds post injection.  After no sensory deficits were reported, and normal lower extremity motor function was noted,   the above injectate was  administered so that equal amounts of the injectate were placed at each foramen (level) into the transforaminal epidural space.   Additional Comments:  The patient tolerated the procedure well Dressing: Band-Aid with 2 x 2 sterile gauze    Post-procedure details: Patient was observed during the procedure. Post-procedure instructions were reviewed.  Patient left the clinic in stable condition.     Clinical History: Severe low back pain radiating to bilateral lower extremities for many years. History of cervical and lumbar spine surgery.  EXAM: MRI LUMBAR SPINE WITHOUT AND WITH CONTRAST  TECHNIQUE: Multiplanar and multiecho pulse sequences of the lumbar spine were obtained without and with intravenous contrast.  CONTRAST:  79mL MULTIHANCE GADOBENATE DIMEGLUMINE 529 MG/ML IV SOLN  COMPARISON:  Lumbar spine radiographs November 09, 2017  FINDINGS: SEGMENTATION: For the purposes of this report, the last well-formed intervertebral disc is reported as L5-S1.  ALIGNMENT: Grade 1 L4-5 anterolisthesis and grade 1 L5-S1 retrolisthesis.  VERTEBRAE: Vertebral bodies are intact. Moderate L4-5, moderate to severe L5-S1 disc height loss with disc desiccation compatible with degenerative discs. Mild associated chronic discogenic endplate changes. No acute abnormal bone marrow signal. No suspicious osseous or disc enhancement. L2 superior endplate old Schmorl's node.  CONUS MEDULLARIS AND CAUDA EQUINA: Conus medullaris terminates at T12-L1 and demonstrates normal morphology and signal characteristics. Cauda equina is normal. No abnormal cord, leptomeningeal or epidural enhancement.  PARASPINAL AND OTHER SOFT TISSUES: Included prevertebral and paraspinal soft tissues are normal.  DISC LEVELS:  T12-L1, L1-2, L2-3: No disc bulge, canal stenosis nor neural foraminal narrowing.  L3-4: Annular bulging, small RIGHT extraforaminal disc protrusion. Minimal facet arthropathy  and ligamentum flavum redundancy without canal stenosis. Mild RIGHT greater than LEFT neural foraminal narrowing.  L4-5: Anterolisthesis. Moderate broad-based disc bulge. Moderate to severe facet arthropathy with trace reactive effusions. Moderate to severe canal stenosis. Moderate to severe RIGHT, moderate LEFT neural foraminal narrowing. Peripherally enhancing bilateral facet synovial cysts within bilateral paraspinal soft tissues measuring to 14 mm.  L5-S1: Status post LEFT laminectomy. Nonenhancing 7 x 11 x 12 mm cyst within laminectomy surgical bed. Retrolisthesis. Moderate RIGHT central disc protrusion with enhancing annular fissure/discectomy defect. No canal stenosis though, granulation tissue and narrowing the lateral recesses may affect the traversing S1 nerves. Minimal facet arthropathy. No canal stenosis.  IMPRESSION: 1. Status post LEFT L5 hemilaminectomy. Grade 1 L4-5 anterolisthesis and grade 1 L5-S1 retrolisthesis. No acute osseous process. 2. 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 L5-S1 lateral recess stenosis and granulation tissue may affect the traversing S1 nerves. 3. Neural foraminal narrowing L3-4 through L5-S1: Moderate to severe on the RIGHT at L4-5.   Electronically Signed   By: Awilda Metro M.D.   On: 11/20/2017 22:39  --- MRI CERVICAL SPINE WITHOUT CONTRAST    IMPRESSION:  1. The symptomatic abnormality is favored to be acute exacerbation  of chronic facet joint arthritis on the right at C2-C3. Marrow edema  there is superimposed on chronic disc and posterior element  degeneration which results in mild spinal stenosis and up to  moderate right C3 foraminal stenosis.  2. Chronic C6-C7 ACDF with solid  arthrodesis.  3. Advanced disc and endplate degeneration C3-C4 through C5-C6 with  multifactorial spinal stenosis at each level. Up to moderate  associated spinal cord mass effect, but no cord signal abnormality.  Severe bilateral C4, C5  and left greater than right C6 foraminal  stenosis.  4. C7-T1 degeneration with mild spinal stenosis, severe left C8  foraminal stenosis.      Electronically Signed  By: Odessa Fleming M.D.  On: 07/21/2019 08:33     Objective:  VS:  HT:    WT:   BMI:     BP:(!) 151/90  HR:91bpm  TEMP: ( )  RESP:  Physical Exam Constitutional:      General: He is not in acute distress.    Appearance: Normal appearance. He is not ill-appearing.  HENT:     Head: Normocephalic and atraumatic.     Right Ear: External ear normal.     Left Ear: External ear normal.  Eyes:     Extraocular Movements: Extraocular movements intact.  Cardiovascular:     Rate and Rhythm: Normal rate.     Pulses: Normal pulses.  Abdominal:     General: There is no distension.     Palpations: Abdomen is soft.  Musculoskeletal:        General: No tenderness or signs of injury.     Right lower leg: No edema.     Left lower leg: No edema.     Comments: Patient has good distal strength without clonus.  Skin:    Findings: No erythema or rash.  Neurological:     General: No focal deficit present.     Mental Status: He is alert and oriented to person, place, and time.     Sensory: No sensory deficit.     Motor: No weakness or abnormal muscle tone.     Coordination: Coordination normal.  Psychiatric:        Mood and Affect: Mood normal.        Behavior: Behavior normal.      Imaging: XR C-ARM NO REPORT  Result Date: 06/04/2020 Please see Notes tab for imaging impression.

## 2020-06-05 NOTE — Procedures (Signed)
S1 Lumbosacral Transforaminal Epidural Steroid Injection - Sub-Pedicular Approach with Fluoroscopic Guidance   [name]      Date of Birth: 07-02-1961 MRN: [id number] PCP: Excell Seltzer, MD      Visit Date: 06/04/2020   Universal Protocol:    Date/Time: 08/20/215:52 AM  Consent Given By: the patient  Position:  PRONE  Additional Comments: Vital signs were monitored before and after the procedure. Patient was prepped and draped in the usual sterile fashion. The correct patient, procedure, and site was verified.   Injection Procedure Details:  Procedure Site One Meds Administered:  Meds ordered this encounter  Medications  . methylPREDNISolone acetate (DEPO-MEDROL) injection 80 mg    Laterality: Left  Location/Site:  S1 Foramen   Needle size: 22 ga.  Needle type: Spinal  Needle Placement: Transforaminal  Findings:   -Comments: Excellent flow of contrast along the nerve and into the epidural space.   Procedure Details: After squaring off the sacral end-plate to get a true AP view, the C-arm was positioned so that the best possible view of the S1 foramen was visualized. The soft tissues overlying this structure were infiltrated with 2-3 ml. of 1% Lidocaine without Epinephrine.    The spinal needle was inserted toward the target using a "trajectory" view along the fluoroscope beam.  Under AP and lateral visualization, the needle was advanced so it did not puncture dura. Biplanar projections were used to confirm position. Aspiration was confirmed to be negative for CSF and/or blood. A 1-2 ml. volume of Isovue-250 was injected and flow of contrast was noted at each level. Radiographs were obtained for documentation purposes.   After attaining the desired flow of contrast documented above, a 0.5 to 1.0 ml test dose of 0.25% Marcaine was injected into each respective transforaminal space.  The patient was observed for 90 seconds post injection.  After no  sensory deficits were reported, and normal lower extremity motor function was noted,   the above injectate was administered so that equal amounts of the injectate were placed at each foramen (level) into the transforaminal epidural space.   Additional Comments:  The patient tolerated the procedure well Dressing: Band-Aid with 2 x 2 sterile gauze    Post-procedure details: Patient was observed during the procedure. Post-procedure instructions were reviewed.  Patient left the clinic in stable condition.

## 2020-06-08 ENCOUNTER — Telehealth: Payer: Self-pay | Admitting: Physical Medicine and Rehabilitation

## 2020-06-08 ENCOUNTER — Encounter: Payer: Self-pay | Admitting: Physical Medicine and Rehabilitation

## 2020-06-08 DIAGNOSIS — M5416 Radiculopathy, lumbar region: Secondary | ICD-10-CM

## 2020-06-08 NOTE — Telephone Encounter (Signed)
MRI lumbar with contrast ordered per Dr. Alvester Morin (see most recent patient advice request in patient's chart)

## 2020-06-08 NOTE — Telephone Encounter (Signed)
Pt called stating since his injections his pain has radiated to his right side and would like a CB to discuss future steps that can be taken  [phone]

## 2020-06-09 ENCOUNTER — Encounter (HOSPITAL_COMMUNITY): Payer: Self-pay

## 2020-06-09 ENCOUNTER — Emergency Department (HOSPITAL_COMMUNITY)
Admission: EM | Admit: 2020-06-09 | Discharge: 2020-06-09 | Disposition: A | Discharge status: Home / Self Care | Payer: Medicare Other | Attending: Emergency Medicine | Admitting: Emergency Medicine

## 2020-06-09 DIAGNOSIS — M79604 Pain in right leg: Secondary | ICD-10-CM | POA: Insufficient documentation

## 2020-06-09 DIAGNOSIS — M549 Dorsalgia, unspecified: Secondary | ICD-10-CM | POA: Insufficient documentation

## 2020-06-09 DIAGNOSIS — Z5321 Procedure and treatment not carried out due to patient leaving prior to being seen by health care provider: Secondary | ICD-10-CM | POA: Diagnosis not present

## 2020-06-09 NOTE — ED Notes (Signed)
Patient decided to leave couldn't wait no later

## 2020-06-09 NOTE — ED Triage Notes (Signed)
Pt reports back pain radiating down R posterior leg x 3-4 days. Reports had epidural injection to L side last week w/ relief of pain, but now same pain to R side.

## 2020-06-11 ENCOUNTER — Other Ambulatory Visit: Payer: Self-pay | Admitting: Physical Medicine and Rehabilitation

## 2020-06-11 DIAGNOSIS — M5136 Other intervertebral disc degeneration, lumbar region: Secondary | ICD-10-CM

## 2020-06-11 DIAGNOSIS — M48062 Spinal stenosis, lumbar region with neurogenic claudication: Secondary | ICD-10-CM

## 2020-06-11 DIAGNOSIS — M5416 Radiculopathy, lumbar region: Secondary | ICD-10-CM

## 2020-06-11 NOTE — Progress Notes (Signed)
Per [facility] I did write new order to say w/o Contrast. He has had prior contrasted MRI I believe. He has had prior back surgery

## 2020-06-19 ENCOUNTER — Other Ambulatory Visit: Payer: Self-pay | Admitting: Physical Medicine and Rehabilitation

## 2020-06-19 DIAGNOSIS — M5416 Radiculopathy, lumbar region: Secondary | ICD-10-CM

## 2020-06-19 DIAGNOSIS — M48062 Spinal stenosis, lumbar region with neurogenic claudication: Secondary | ICD-10-CM

## 2020-06-19 DIAGNOSIS — M5136 Other intervertebral disc degeneration, lumbar region: Secondary | ICD-10-CM

## 2020-06-24 ENCOUNTER — Other Ambulatory Visit: Payer: Self-pay | Admitting: Family Medicine

## 2020-06-25 NOTE — Telephone Encounter (Signed)
Last office visit 05/07/2020 for leg/shoulder pain.  Last refilled 05/22/2020 for #90 with no refills.  No future appointments.

## 2020-07-11 ENCOUNTER — Inpatient Hospital Stay: Admission: RE | Admit: 2020-07-11 | Payer: Medicare Other | Source: Ambulatory Visit

## 2020-07-29 ENCOUNTER — Emergency Department (HOSPITAL_COMMUNITY)
Admission: EM | Admit: 2020-07-29 | Discharge: 2020-07-29 | Disposition: A | Discharge status: Home / Self Care | Payer: Medicare Other | Attending: Emergency Medicine | Admitting: Emergency Medicine

## 2020-07-29 ENCOUNTER — Other Ambulatory Visit: Payer: Self-pay | Admitting: Family Medicine

## 2020-07-29 ENCOUNTER — Other Ambulatory Visit: Payer: Self-pay

## 2020-07-29 ENCOUNTER — Encounter (HOSPITAL_COMMUNITY): Payer: Self-pay | Admitting: Emergency Medicine

## 2020-07-29 DIAGNOSIS — M5431 Sciatica, right side: Secondary | ICD-10-CM | POA: Insufficient documentation

## 2020-07-29 DIAGNOSIS — M5442 Lumbago with sciatica, left side: Secondary | ICD-10-CM | POA: Diagnosis not present

## 2020-07-29 DIAGNOSIS — Z79899 Other long term (current) drug therapy: Secondary | ICD-10-CM | POA: Diagnosis not present

## 2020-07-29 DIAGNOSIS — I10 Essential (primary) hypertension: Secondary | ICD-10-CM | POA: Insufficient documentation

## 2020-07-29 DIAGNOSIS — M5432 Sciatica, left side: Secondary | ICD-10-CM | POA: Insufficient documentation

## 2020-07-29 DIAGNOSIS — M5441 Lumbago with sciatica, right side: Secondary | ICD-10-CM | POA: Diagnosis not present

## 2020-07-29 DIAGNOSIS — G8929 Other chronic pain: Secondary | ICD-10-CM

## 2020-07-29 DIAGNOSIS — M545 Low back pain, unspecified: Secondary | ICD-10-CM | POA: Diagnosis present

## 2020-07-29 MED ORDER — DEXAMETHASONE SODIUM PHOSPHATE 10 MG/ML IJ SOLN
10.0000 mg | Freq: Once | INTRAMUSCULAR | Status: AC
  Administered 2020-07-29: 10 mg via INTRAMUSCULAR
  Filled 2020-07-29: qty 1

## 2020-07-29 NOTE — ED Provider Notes (Signed)
[facility] EMERGENCY DEPARTMENT Provider Note   CSN: [id number] Arrival date & time: 07/29/20  7628     History Chief Complaint  Patient presents with   Back Pain    Adrian Cox is a 59 y.o. male.  Patient with known lumbar spine disease. He is scheduled for a MRI on Friday. Recent exacerbation of chronic pain, not responding to home medications. Pain increased after standing in line at the bank. Occasional weakness of lower extremities, with pain radiating to legs. No loss of bowel or bladder control. No perineal numbness. Patient is ambulatory in the room without difficulty  The history is provided by the patient and medical records. No language interpreter was used.  Back Pain Location:  Lumbar spine Quality:  Shooting and stabbing Pain severity:  Moderate Pain is:  Same all the time Timing:  Constant Progression:  Unchanged Chronicity:  Chronic Ineffective treatments:  Narcotics Associated symptoms: tingling   Associated symptoms: no abdominal pain, no bladder incontinence, no bowel incontinence, no dysuria, no fever, no headaches and no perianal numbness        Past Medical History:  Diagnosis Date   Depression    bipolar   Diverticulosis    Hyperlipidemia    Hypertension    Mold exposure    Sciatic nerve pain     Patient Active Problem List   Diagnosis Date Noted   Spinal stenosis of lumbar region with neurogenic claudication 06/03/2020   Acute pain of left shoulder 05/07/2020   Microscopic hematuria 02/07/2020   Erectile dysfunction 01/31/2020   Cervical spondylosis without myelopathy 10/31/2019   Post laminectomy syndrome 10/31/2019   Constipation due to opioid therapy 09/20/2019   Excessive sweating 09/20/2019   Left leg pain 02/28/2019   Elevated liver function tests 11/20/2018   Chronic pain syndrome 11/20/2018   Cervical radiculopathy 09/05/2018   Lumbar radiculopathy 08/17/2018   Pain in right thigh  03/27/2018   Lateral knee pain, right 03/27/2018   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   Chronic low back pain with sciatica 11/09/2017   Osteoarthritis of spine 02/23/2017   Chronic right shoulder pain 01/09/2017   Essential hypertension 08/17/2016   Bipolar disorder (HCC) 08/17/2016   Hyperlipidemia 08/17/2016    Past Surgical History:  Procedure Laterality Date   BACK SURGERY     CERVICAL SPINE SURGERY  2003   fusion   KNEE ARTHROSCOPY Right    KNEE ARTHROSCOPY Left    SPINE SURGERY         Family History  Problem Relation Age of Onset   Colon cancer Mother 36   Hypertension Mother    Alzheimer's disease Father    Hypertension Father    Hypertension Sister    Diabetes Sister    Diabetes Maternal Grandmother    Hypertension Maternal Grandmother    Hypertension Sister    Diabetes Sister     Social History   Tobacco Use   Smoking status: Never Smoker   Smokeless tobacco: Never Used  Building services engineer Use: Never used  Substance Use Topics   Alcohol use: No   Drug use: Yes    Types: Marijuana    Comment: Hx marijuana use - none x 2 yrs    Home Medications Prior to Admission medications   Medication Sig Start Date End Date Taking? Authorizing Provider  pregabalin (LYRICA) 150 MG capsule TAKE 1 CAPSULE (150 MG TOTAL) BY MOUTH 3 (THREE) TIMES DAILY. 06/25/20   Bedsole, Amy E,  MD  atorvastatin (LIPITOR) 10 MG tablet Take 1 tablet (10 mg total) by mouth daily. 02/07/20   Bedsole, Amy E, MD  Cholecalciferol (VITAMIN D) 50 MCG (2000 UT) tablet Take 2,000 Units by mouth daily.    [provider]  cyclobenzaprine (FLEXERIL) 10 MG tablet Take 1 tablet (10 mg total) by mouth at bedtime as needed for muscle spasms. 02/07/20   Bedsole, Amy E, MD  FIBER PO Take 2 tablets by mouth 3 (three) times daily. gummies    [provider]  lisinopril (ZESTRIL) 10 MG tablet TAKE 2 TABLETS BY MOUTH EVERY DAY 06/03/20   Bedsole, Amy E, MD    Multiple Vitamins-Minerals (MULTIVITAMIN ADULT PO) Take 2 tablets by mouth daily. gummies    [provider]  oxycodone (OXY-IR) 5 MG capsule Take 1 capsule (5 mg total) by mouth daily as needed for pain. Do not fill until 07/14/2020 05/13/20   Excell Seltzer, MD  predniSONE (DELTASONE) 20 MG tablet 3 tabs by mouth daily x 4 days, then 2 tabs by mouth daily x 4 days then 1 tab by mouth daily x 2 days 05/07/20   Ermalene Searing, Amy E, MD  tadalafil (CIALIS) 20 MG tablet TAKE 1 TABLET BY MOUTH ONCE DAILY AS NEEDED FOR ERECTILE DYSFUNCTION 05/14/20   Excell Seltzer, MD    Allergies    Patient has no known allergies.  Review of Systems   Review of Systems  Constitutional: Negative for fever.  Gastrointestinal: Negative for abdominal pain and bowel incontinence.  Genitourinary: Negative for bladder incontinence and dysuria.  Musculoskeletal: Positive for back pain.  Neurological: Positive for tingling. Negative for headaches.  All other systems reviewed and are negative.   Physical Exam Updated Vital Signs BP (!) 144/76    Pulse (!) 57    Temp 98.7 F (37.1 C) (Oral)    Resp 18    SpO2 100%   Physical Exam Vitals and nursing note reviewed.  Constitutional:      Appearance: Normal appearance.  HENT:     Head: Atraumatic.     Nose: Nose normal.     Mouth/Throat:     Mouth: Mucous membranes are moist.  Eyes:     Conjunctiva/sclera: Conjunctivae normal.  Cardiovascular:     Rate and Rhythm: Normal rate.  Pulmonary:     Effort: Pulmonary effort is normal.     Breath sounds: Normal breath sounds.  Abdominal:     Palpations: Abdomen is soft.  Musculoskeletal:        General: Normal range of motion.  Skin:    General: Skin is warm and dry.  Neurological:     General: No focal deficit present.     Mental Status: He is alert and oriented to person, place, and time.  Psychiatric:        Mood and Affect: Mood normal.        Behavior: Behavior normal.     ED Results / Procedures  / Treatments   Labs (all labs ordered are listed, but only abnormal results are displayed) Labs Reviewed - No data to display  EKG None  Radiology No results found.  Procedures Procedures (including critical care time)  Medications Ordered in ED Medications  dexamethasone (DECADRON) injection 10 mg (10 mg Intramuscular Given 07/29/20 1324)    ED Course  I have reviewed the triage vital signs and the nursing notes.  Pertinent labs & imaging results that were available during my care of the patient were reviewed  by me and considered in my medical decision making (see chart for details).    MDM Rules/Calculators/A&P                          Patient with back pain.  No neurological deficits and normal neuro exam.  Patient is ambulatory.  No loss of bowel or bladder control.  No concern for cauda equina.  No fever, night sweats, weight loss, h/o cancer, IVDA, no recent procedure to back. No urinary symptoms suggestive of UTI.  Supportive care and return precaution discussed. Given decadron in ED. Is scheduled for MRI of lumbar spine on 07/31/20. Appears safe for discharge at this time. Follow up as indicated in discharge paperwork.  Final Clinical Impression(s) / ED Diagnoses Final diagnoses:  Chronic midline low back pain with bilateral sciatica    Rx / DC Orders ED Discharge Orders    None       Felicie Morn, NP 07/29/20 1437    Melene Plan, DO 08/01/20 1504

## 2020-07-29 NOTE — ED Triage Notes (Signed)
Pt reports some chronic back pain for which he takes oxycodone, states he had to stand in line for 40 mins at the bank last week and feels like that flared up his pain. C/o numbness and tingling to both legs, no loss of bowel or bladder, ambulatory to triage with nad.

## 2020-07-30 NOTE — Telephone Encounter (Signed)
Last office visit 05/07/2020 for leg/shoulder pain.  Last refilled 06/25/2020 for #90 with no refills.  No future appointments with PCP.

## 2020-07-31 ENCOUNTER — Other Ambulatory Visit: Payer: Self-pay

## 2020-07-31 ENCOUNTER — Ambulatory Visit
Admission: RE | Admit: 2020-07-31 | Discharge: 2020-07-31 | Disposition: A | Discharge status: Home / Self Care | Payer: Medicare Other | Source: Ambulatory Visit | Attending: Physical Medicine and Rehabilitation | Admitting: Physical Medicine and Rehabilitation

## 2020-07-31 DIAGNOSIS — M545 Low back pain, unspecified: Secondary | ICD-10-CM | POA: Diagnosis not present

## 2020-07-31 DIAGNOSIS — M48062 Spinal stenosis, lumbar region with neurogenic claudication: Secondary | ICD-10-CM

## 2020-07-31 DIAGNOSIS — M5416 Radiculopathy, lumbar region: Secondary | ICD-10-CM

## 2020-07-31 DIAGNOSIS — M48061 Spinal stenosis, lumbar region without neurogenic claudication: Secondary | ICD-10-CM | POA: Diagnosis not present

## 2020-07-31 DIAGNOSIS — M5136 Other intervertebral disc degeneration, lumbar region: Secondary | ICD-10-CM

## 2020-08-01 ENCOUNTER — Encounter (HOSPITAL_COMMUNITY): Payer: Self-pay | Admitting: Emergency Medicine

## 2020-08-01 ENCOUNTER — Emergency Department (HOSPITAL_COMMUNITY)
Admission: EM | Admit: 2020-08-01 | Discharge: 2020-08-01 | Disposition: A | Discharge status: Home / Self Care | Payer: Medicare Other | Attending: Emergency Medicine | Admitting: Emergency Medicine

## 2020-08-01 ENCOUNTER — Other Ambulatory Visit: Payer: Self-pay

## 2020-08-01 DIAGNOSIS — K5903 Drug induced constipation: Secondary | ICD-10-CM | POA: Diagnosis not present

## 2020-08-01 DIAGNOSIS — Z79899 Other long term (current) drug therapy: Secondary | ICD-10-CM | POA: Insufficient documentation

## 2020-08-01 DIAGNOSIS — M5441 Lumbago with sciatica, right side: Secondary | ICD-10-CM | POA: Diagnosis not present

## 2020-08-01 DIAGNOSIS — G8929 Other chronic pain: Secondary | ICD-10-CM | POA: Diagnosis not present

## 2020-08-01 DIAGNOSIS — M545 Low back pain, unspecified: Secondary | ICD-10-CM | POA: Diagnosis present

## 2020-08-01 DIAGNOSIS — I10 Essential (primary) hypertension: Secondary | ICD-10-CM | POA: Diagnosis not present

## 2020-08-01 DIAGNOSIS — M5440 Lumbago with sciatica, unspecified side: Secondary | ICD-10-CM | POA: Diagnosis not present

## 2020-08-01 DIAGNOSIS — M546 Pain in thoracic spine: Secondary | ICD-10-CM | POA: Diagnosis not present

## 2020-08-01 MED ORDER — GLYCERIN (ADULT) 2 G RE SUPP: 1.0000 | RECTAL | 2 refills | Status: DC | PRN

## 2020-08-01 MED ORDER — POLYETHYLENE GLYCOL 3350 17 G PO PACK: 17.0000 g | PACK | Freq: Every day | ORAL | 3 refills | Status: DC

## 2020-08-01 MED ORDER — DOCUSATE SODIUM 100 MG PO CAPS: 100.0000 mg | ORAL_CAPSULE | Freq: Two times a day (BID) | ORAL | 2 refills | Status: AC

## 2020-08-01 MED ORDER — HYDROMORPHONE HCL 1 MG/ML IJ SOLN
1.0000 mg | Freq: Once | INTRAMUSCULAR | Status: AC
  Administered 2020-08-01: 1 mg via INTRAMUSCULAR
  Filled 2020-08-01: qty 1

## 2020-08-01 MED ORDER — MAGNESIUM CITRATE PO SOLN: 1.0000 | Freq: Once | ORAL | 3 refills | Status: AC

## 2020-08-01 MED ORDER — KETOROLAC TROMETHAMINE 60 MG/2ML IM SOLN
60.0000 mg | Freq: Once | INTRAMUSCULAR | Status: AC
  Administered 2020-08-01: 60 mg via INTRAMUSCULAR
  Filled 2020-08-01: qty 2

## 2020-08-01 NOTE — Discharge Instructions (Signed)
1.  You have been given medications to take for constipation.  Take Colace twice daily every day.  This is a stool softener to help to avoid constipation with narcotic use.  Take MiraLAX daily until you have had a normal bowel movement.  Then, take MiraLAX every third day if you have not had a regular bowel movement.  Use magnesium citrate as prescribed for occasional severe constipation requiring more aggressive treatment.  You may also use glycerin suppositories to help pass stool rectally. 2.  You were given a medication for pain in the emergency department.  You must call your pain doctor on Monday to discuss through pain you are experiencing requiring emergency department visits.

## 2020-08-01 NOTE — ED Triage Notes (Signed)
Pt reports chronic lower back pain.  States he was seen in ED for same on Thursday and "shot wore off".  Also reports constipation that he believes is related to his pain medication.

## 2020-08-01 NOTE — ED Provider Notes (Signed)
[facility] Provider Note   CSN: [id number] Arrival date & time: 08/01/20  1400     History Chief Complaint  Patient presents with  . Back Pain  . Constipation    Adrian Cox is a 59 y.o. male.  HPI Patient has chronic low back pain.  He reports he is having increase of pain in his lower back and radiating down the right leg.  He reports sometimes the pain does variably radiate from one leg to the other.  Patient had a repeat MRI done yesterday.  He also had one done in September.  There have not been acute changes requiring surgical intervention. Patient reports in addition to breakthrough pain, he is experiencing constipation.  He reports he cannot remember the last time he had a bowel movement.  He is getting intermittent cramping abdominal pain and urgency to defecate but not passing stool.  Reports he has tried a couple doses of MiraLAX without relief.  No fever no chills no vomiting.    Past Medical History:  Diagnosis Date  . Depression    bipolar  . Diverticulosis   . Hyperlipidemia   . Hypertension   . Mold exposure   . Sciatic nerve pain     Patient Active Problem List   Diagnosis Date Noted  . Spinal stenosis of lumbar region with neurogenic claudication 06/03/2020  . Acute pain of left shoulder 05/07/2020  . Microscopic hematuria 02/07/2020  . Erectile dysfunction 01/31/2020  . Cervical spondylosis without myelopathy 10/31/2019  . Post laminectomy syndrome 10/31/2019  . Constipation due to opioid therapy 09/20/2019  . Excessive sweating 09/20/2019  . Left leg pain 02/28/2019  . Elevated liver function tests 11/20/2018  . Chronic pain syndrome 11/20/2018  . Cervical radiculopathy 09/05/2018  . Lumbar radiculopathy 08/17/2018  . Pain in right thigh 03/27/2018  . Lateral knee pain, right 03/27/2018  . 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 Chronic low back pain with sciatica 11/09/2017  . Osteoarthritis of  spine 02/23/2017  . Chronic right shoulder pain 01/09/2017  . Essential hypertension 08/17/2016  . Bipolar disorder (HCC) 08/17/2016  . Hyperlipidemia 08/17/2016    Past Surgical History:  Procedure Laterality Date  . BACK SURGERY    . CERVICAL SPINE SURGERY  2003   fusion  . KNEE ARTHROSCOPY Right   . KNEE ARTHROSCOPY Left   . SPINE SURGERY         Family History  Problem Relation Age of Onset  . Colon cancer Mother 860  . Hypertension Mother   . Alzheimer's disease Father   . Hypertension Father   . Hypertension Sister   . Diabetes Sister   . Diabetes Maternal Grandmother   . Hypertension Maternal Grandmother   . Hypertension Sister   . Diabetes Sister     Social History   Tobacco Use  . Smoking status: Never Smoker  . Smokeless tobacco: Never Used  Vaping Use  . Vaping Use: Never used  Substance Use Topics  . Alcohol use: No  . Drug use: Yes    Types: Marijuana    Comment: Hx marijuana use - none x 2 yrs    Home Medications Prior to Admission medications   Medication Sig Start Date End Date Taking? Authorizing Provider  atorvastatin (LIPITOR) 10 MG tablet Take 1 tablet (10 mg total) by mouth daily. 02/07/20   Bedsole, Amy E, MD  Cholecalciferol (VITAMIN D) 50 MCG (2000 UT) tablet Take 2,000 Units by mouth daily.  [provider]  cyclobenzaprine (FLEXERIL) 10 MG tablet Take 1 tablet (10 mg total) by mouth at bedtime as needed for muscle spasms. 02/07/20   Bedsole, Amy E, MD  docusate sodium (COLACE) 100 MG capsule Take 1 capsule (100 mg total) by mouth every 12 (twelve) hours. 08/01/20   Arby Barrette, MD  FIBER PO Take 2 tablets by mouth 3 (three) times daily. gummies    [provider]  glycerin adult 2 g suppository Place 1 suppository rectally as needed for constipation. 08/01/20   Arby Barrette, MD  lisinopril (ZESTRIL) 10 MG tablet TAKE 2 TABLETS BY MOUTH EVERY DAY 06/03/20   Bedsole, Amy E, MD  magnesium citrate SOLN Take 296 mLs  (1 Bottle total) by mouth once for 1 dose. 08/01/20 08/01/20  Arby Barrette, MD  Multiple Vitamins-Minerals (MULTIVITAMIN ADULT PO) Take 2 tablets by mouth daily. gummies    [provider]  oxycodone (OXY-IR) 5 MG capsule Take 1 capsule (5 mg total) by mouth daily as needed for pain. Do not fill until 07/14/2020 05/13/20   Excell Seltzer, MD  polyethylene glycol (MIRALAX / GLYCOLAX) 17 g packet Take 17 g by mouth daily. 08/01/20   Arby Barrette, MD  predniSONE (DELTASONE) 20 MG tablet 3 tabs by mouth daily x 4 days, then 2 tabs by mouth daily x 4 days then 1 tab by mouth daily x 2 days 05/07/20   Bedsole, Amy E, MD  pregabalin (LYRICA) 150 MG capsule TAKE 1 CAPSULE (150 MG TOTAL) BY MOUTH 3 (THREE) TIMES DAILY. 07/30/20   Bedsole, Amy E, MD  tadalafil (CIALIS) 20 MG tablet TAKE 1 TABLET BY MOUTH ONCE DAILY AS NEEDED FOR ERECTILE DYSFUNCTION 05/14/20   Excell Seltzer, MD    Allergies    Patient has no known allergies.  Review of Systems   Review of Systems 10 systems reviewed and negative except as per HPI Physical Exam Updated Vital Signs BP 122/83 (BP Location: Right Arm)   Pulse (!) 57   Temp 98.6 F (37 C) (Oral)   Resp 15   Ht 5\' 11"  (1.803 m)   Wt 94.8 kg   SpO2 99%   BMI 29.15 kg/m   Physical Exam Constitutional:      Comments: Alert nontoxic.  Clinically well in appearance.  Patient appears uncomfortable shifting about in the chair and holding his lower back.  Eyes:     Extraocular Movements: Extraocular movements intact.  Cardiovascular:     Rate and Rhythm: Normal rate and regular rhythm.  Pulmonary:     Effort: Pulmonary effort is normal.     Breath sounds: Normal breath sounds.  Abdominal:     General: There is no distension.     Palpations: Abdomen is soft.     Tenderness: There is no abdominal tenderness. There is no guarding.  Musculoskeletal:        General: Normal range of motion.  Skin:    General: Skin is warm and dry.  Neurological:      General: No focal deficit present.     Mental Status: He is oriented to person, place, and time.     Motor: No weakness.     Coordination: Coordination normal.  Psychiatric:        Mood and Affect: Mood normal.     ED Results / Procedures / Treatments   Labs (all labs ordered are listed, but only abnormal results are displayed) Labs Reviewed - No data to display  EKG None  Radiology MR LUMBAR SPINE WO CONTRAST  Result Date: 07/31/2020 CLINICAL DATA:  Low back pain radiating into bilateral lower extremities, history of remote surgery EXAM: MRI LUMBAR SPINE WITHOUT CONTRAST TECHNIQUE: Multiplanar, multisequence MR imaging of the lumbar spine was performed. No intravenous contrast was administered. COMPARISON:  2019 FINDINGS: Segmentation:  Standard. Alignment: Stable with mild retrolisthesis at L3-L4 and L5-S1 and anterolisthesis at L4-L5. Vertebrae: Stable vertebral body heights. No substantial marrow edema. Probable small vertebral body hemangioma at L1. Conus medullaris and cauda equina: Conus extends to the L1 level. Conus and cauda equina appear normal. Paraspinal and other soft tissues: Unremarkable apart from chronic postoperative changes. Disc levels: L1-L2:  No canal or foraminal stenosis. L2-L3:  No canal or foraminal stenosis. L3-L4: Disc bulge with superimposed right foraminal protrusion, endplate osteophytic ridging, and mild facet arthropathy with ligamentum flavum infolding. Mild canal stenosis. Partial effacement of the subarticular recesses. Increased mild to moderate right foraminal stenosis. Similar mild left foraminal stenosis. L4-L5: Anterolisthesis with uncovering of disc bulge with superimposed right foraminal protrusion, endplate osteophytic ridging, and marked facet arthropathy with ligamentum flavum infolding. Increased marked canal stenosis with effacement of the subarticular recesses. Similar moderate foraminal stenosis, right greater than left. L5-S1: Prior left  laminectomy with unchanged small cyst/chronic fluid collection within the laminectomy bed. Disc bulge slightly eccentric to the right, endplate osteophytic ridging, and facet arthropathy with residual ligamentum flavum infolding. Mild canal stenosis. Partial effacement the subarticular recesses. Mild foraminal stenosis. Appearance is similar. IMPRESSION: Lower lumbar degenerative changes as detailed above with some progression since 2019. Notably, there is increased canal stenosis at L4-L5 with subarticular recess effacement. Foraminal stenosis remains greatest at this level as well. Electronically Signed   By: Guadlupe Spanish M.D.   On: 07/31/2020 09:31    Procedures Procedures (including critical care time)  Medications Ordered in ED Medications  ketorolac (TORADOL) injection 60 mg (60 mg Intramuscular Given 08/01/20 1849)  HYDROmorphone (DILAUDID) injection 1 mg (1 mg Intramuscular Given 08/01/20 1848)    ED Course  I have reviewed the triage vital signs and the nursing notes.  Pertinent labs & imaging results that were available during my care of the patient were reviewed by me and considered in my medical decision making (see chart for details).    MDM Rules/Calculators/A&P                          Patient presents with complaint of worsening chronic back pain.  He did have MRI yesterday without emergent changes.  He has chronic degenerative disease and spinal stenosis with some progression since 2019.  Patient was seen in the emergency department 3 days ago and given a dose of Decadron.  At this time, for acute pain control will administer Toradol and Dilaudid.  Patient is prescribed small quantity of oxycodone monthly for pain control.  I have advised he call his pain specialist Monday to review pain control options.  Patient has secondary complaint of constipation.  Abdomen is soft and nontender without signs of surgical abdomen.  Will provide aggressive regimen for the next several days.   Patient is counseled on taking Colace twice daily as baseline medication.  He is to take MiraLAX daily until he is having regular bowel movements.  He is recommended to try a one-time dose of magnesium citrate and glycerin suppositories as needed. Final Clinical Impression(s) / ED Diagnoses Final diagnoses:  Chronic midline low back pain with sciatica, sciatica laterality unspecified  Drug-induced constipation    Rx / DC Orders ED Discharge Orders         Ordered    polyethylene glycol (MIRALAX / GLYCOLAX) 17 g packet  Daily        08/01/20 1916    docusate sodium (COLACE) 100 MG capsule  Every 12 hours        08/01/20 1916    glycerin adult 2 g suppository  As needed        08/01/20 1916    magnesium citrate SOLN   Once        08/01/20 1916           Arby Barrette, MD 08/01/20 1925

## 2020-08-10 ENCOUNTER — Other Ambulatory Visit: Payer: Self-pay | Admitting: Family Medicine

## 2020-08-11 ENCOUNTER — Encounter: Payer: Self-pay | Admitting: Physical Medicine and Rehabilitation

## 2020-08-11 ENCOUNTER — Other Ambulatory Visit: Payer: Self-pay

## 2020-08-11 ENCOUNTER — Ambulatory Visit (INDEPENDENT_AMBULATORY_CARE_PROVIDER_SITE_OTHER): Payer: Medicare Other | Admitting: Physical Medicine and Rehabilitation

## 2020-08-11 ENCOUNTER — Telehealth: Payer: Self-pay | Admitting: Physical Medicine and Rehabilitation

## 2020-08-11 VITALS — BP 145/75 | HR 70

## 2020-08-11 DIAGNOSIS — M5416 Radiculopathy, lumbar region: Secondary | ICD-10-CM | POA: Diagnosis not present

## 2020-08-11 DIAGNOSIS — G894 Chronic pain syndrome: Secondary | ICD-10-CM

## 2020-08-11 DIAGNOSIS — M48062 Spinal stenosis, lumbar region with neurogenic claudication: Secondary | ICD-10-CM | POA: Diagnosis not present

## 2020-08-11 NOTE — Telephone Encounter (Signed)
Needs auth for [id number]. Left L5 TF. Scheduled for 10/28.

## 2020-08-11 NOTE — Progress Notes (Signed)
Here for MRI review. Bilateral low back and buttock pain with pain down left leg to ankle. Numeric Pain Rating Scale and Functional Assessment Average Pain 10 Pain Right Now 10 My pain is constant, dull and aching Pain is worse with: walking, sitting and standing Pain improves with: medication   In the last MONTH (on 0-10 scale) has pain interfered with the following?  1. General activity like being  able to carry out your everyday physical activities such as walking, climbing stairs, carrying groceries, or moving a chair?  Rating(6)  2. Relation with others like being able to carry out your usual social activities and roles such as  activities at home, at work and in your community. Rating(6)  3. Enjoyment of life such that you have  been bothered by emotional problems such as feeling anxious, depressed or irritable?  Rating(8)

## 2020-08-12 NOTE — Telephone Encounter (Signed)
Pt not req Auth#. 

## 2020-08-13 ENCOUNTER — Ambulatory Visit: Payer: Self-pay

## 2020-08-13 ENCOUNTER — Other Ambulatory Visit: Payer: Self-pay

## 2020-08-13 ENCOUNTER — Ambulatory Visit (INDEPENDENT_AMBULATORY_CARE_PROVIDER_SITE_OTHER): Payer: Medicare Other | Admitting: Physical Medicine and Rehabilitation

## 2020-08-13 ENCOUNTER — Encounter: Payer: Self-pay | Admitting: Physical Medicine and Rehabilitation

## 2020-08-13 VITALS — BP 177/76 | HR 60

## 2020-08-13 DIAGNOSIS — M5416 Radiculopathy, lumbar region: Secondary | ICD-10-CM | POA: Diagnosis not present

## 2020-08-13 DIAGNOSIS — M48062 Spinal stenosis, lumbar region with neurogenic claudication: Secondary | ICD-10-CM

## 2020-08-13 MED ORDER — METHYLPREDNISOLONE ACETATE 80 MG/ML IJ SUSP
80.0000 mg | Freq: Once | INTRAMUSCULAR | Status: AC
  Administered 2020-08-13: 80 mg

## 2020-08-13 NOTE — Progress Notes (Signed)
Pt state lower back pain. Pt state standing, bending and sitting for a long period of time makes the pain worse. Pt state he has to keep moving and he take pain med and use heating pads to ease the pain.  Numeric Pain Rating Scale and Functional Assessment Average Pain 0   In the last MONTH (on 0-10 scale) has pain interfered with the following?  1. General activity like being  able to carry out your everyday physical activities such as walking, climbing stairs, carrying groceries, or moving a chair?  Rating(10)   +Driver, -BT, -Dye Allergies.

## 2020-08-21 ENCOUNTER — Ambulatory Visit: Payer: Medicare Other | Admitting: Family Medicine

## 2020-09-05 ENCOUNTER — Other Ambulatory Visit: Payer: Self-pay | Admitting: Family Medicine

## 2020-09-07 NOTE — Telephone Encounter (Signed)
Last office visit 05/07/2020 for leg/shoulder pain.  Last refilled 07/30/2020 for #90 with no refills.  No future appointments.

## 2020-09-14 ENCOUNTER — Encounter: Payer: Self-pay | Admitting: Physical Medicine and Rehabilitation

## 2020-09-14 NOTE — Procedures (Signed)
Lumbosacral Transforaminal Epidural Steroid Injection - Sub-Pedicular Approach with Fluoroscopic Guidance  [name]      Date of Birth: [birth year] MRN: [id number] PCP: Excell Seltzer, MD      Visit Date: 08/13/2020   Universal Protocol:    Date/Time: 08/13/2020  Consent Given By: the patient  Position: PRONE  Additional Comments: Vital signs were monitored before and after the procedure. Patient was prepped and draped in the usual sterile fashion. The correct patient, procedure, and site was verified.   Injection Procedure Details:   Procedure diagnoses:  1. Lumbar radiculopathy   2. Spinal stenosis of lumbar region with neurogenic claudication      Meds Administered:  Meds ordered this encounter  Medications  . methylPREDNISolone acetate (DEPO-MEDROL) injection 80 mg    Laterality: Left  Location/Site:  L4-L5  Needle:5.0 in., 22 ga.  Short bevel or Quincke spinal needle  Needle Placement: Transforaminal  Findings:    -Comments: Excellent flow of contrast along the nerve, nerve root and into the epidural space.  Procedure Details: After squaring off the end-plates to get a true AP view, the C-arm was positioned so that an oblique view of the foramen as noted above was visualized. The target area is just inferior to the "nose of the scotty dog" or sub pedicular. The soft tissues overlying this structure were infiltrated with 2-3 ml. of 1% Lidocaine without Epinephrine.  The spinal needle was inserted toward the target using a "trajectory" view along the fluoroscope beam.  Under AP and lateral visualization, the needle was advanced so it did not puncture dura and was located close the 6 O'Clock position of the pedical in AP tracterory. Biplanar projections were used to confirm position. Aspiration was confirmed to be negative for CSF and/or blood. A 1-2 ml. volume of Isovue-250 was injected and flow of contrast was noted at each level. Radiographs were  obtained for documentation purposes.   After attaining the desired flow of contrast documented above, a 0.5 to 1.0 ml test dose of 0.25% Marcaine was injected into each respective transforaminal space.  The patient was observed for 90 seconds post injection.  After no sensory deficits were reported, and normal lower extremity motor function was noted,   the above injectate was administered so that equal amounts of the injectate were placed at each foramen (level) into the transforaminal epidural space.   Additional Comments:  The patient tolerated the procedure well Dressing: 2 x 2 sterile gauze and Band-Aid    Post-procedure details: Patient was observed during the procedure. Post-procedure instructions were reviewed.  Patient left the clinic in stable condition.

## 2020-09-14 NOTE — Progress Notes (Signed)
Adrian Cox - 59 y.o. male MRN [id number]  Date of birth: [birth year]  Office Visit Note: Visit Date: 08/13/2020 PCP: Excell Seltzer, MD Referred by: Excell Seltzer, MD  Subjective: Chief Complaint  Patient presents with  . Lower Back - Pain   HPI:  Adrian Cox is a 58 y.o. male who comes in today at the request of Dr. Naaman Plummer for planned Left L4-L5 Lumbar epidural steroid injection with fluoroscopic guidance.  The patient has failed conservative care including home exercise, medications, time and activity modification.  This injection will be diagnostic and hopefully therapeutic.  Please see requesting physician notes for further details and justification.  MRI reviewed with images and spine model.  MRI reviewed in the note below.   ROS Otherwise per HPI.  Assessment & Plan: Visit Diagnoses:  1. Lumbar radiculopathy   2. Spinal stenosis of lumbar region with neurogenic claudication     Plan: No additional findings.   Meds & Orders:  Meds ordered this encounter  Medications  . methylPREDNISolone acetate (DEPO-MEDROL) injection 80 mg    Orders Placed This Encounter  Procedures  . XR C-ARM NO REPORT  . Epidural Steroid injection    Follow-up: Return if symptoms worsen or fail to improve.   Procedures: No procedures performed  Lumbosacral Transforaminal Epidural Steroid Injection - Sub-Pedicular Approach with Fluoroscopic Guidance  [name]      Date of Birth: 11-Jul-1961 MRN: [id number] PCP: Excell Seltzer, MD      Visit Date: 08/13/2020   Universal Protocol:    Date/Time: 08/13/2020  Consent Given By: the patient  Position: PRONE  Additional Comments: Vital signs were monitored before and after the procedure. Patient was prepped and draped in the usual sterile fashion. The correct patient, procedure, and site was verified.   Injection Procedure Details:   Procedure diagnoses:  1. Lumbar radiculopathy   2. Spinal stenosis of  lumbar region with neurogenic claudication      Meds Administered:  Meds ordered this encounter  Medications  . methylPREDNISolone acetate (DEPO-MEDROL) injection 80 mg    Laterality: Left  Location/Site:  L4-L5  Needle:5.0 in., 22 ga.  Short bevel or Quincke spinal needle  Needle Placement: Transforaminal  Findings:    -Comments: Excellent flow of contrast along the nerve, nerve root and into the epidural space.  Procedure Details: After squaring off the end-plates to get a true AP view, the C-arm was positioned so that an oblique view of the foramen as noted above was visualized. The target area is just inferior to the "nose of the scotty dog" or sub pedicular. The soft tissues overlying this structure were infiltrated with 2-3 ml. of 1% Lidocaine without Epinephrine.  The spinal needle was inserted toward the target using a "trajectory" view along the fluoroscope beam.  Under AP and lateral visualization, the needle was advanced so it did not puncture dura and was located close the 6 O'Clock position of the pedical in AP tracterory. Biplanar projections were used to confirm position. Aspiration was confirmed to be negative for CSF and/or blood. A 1-2 ml. volume of Isovue-250 was injected and flow of contrast was noted at each level. Radiographs were obtained for documentation purposes.   After attaining the desired flow of contrast documented above, a 0.5 to 1.0 ml test dose of 0.25% Marcaine was injected into each respective transforaminal space.  The patient was observed for 90 seconds post injection.  After no sensory deficits were reported,  and normal lower extremity motor function was noted,   the above injectate was administered so that equal amounts of the injectate were placed at each foramen (level) into the transforaminal epidural space.   Additional Comments:  The patient tolerated the procedure well Dressing: 2 x 2 sterile gauze and Band-Aid    Post-procedure  details: Patient was observed during the procedure. Post-procedure instructions were reviewed.  Patient left the clinic in stable condition.      Clinical History: MRI LUMBAR SPINE WITHOUT CONTRAST  TECHNIQUE: Multiplanar, multisequence MR imaging of the lumbar spine was performed. No intravenous contrast was administered.  COMPARISON:  2019  FINDINGS: Segmentation:  Standard.  Alignment: Stable with mild retrolisthesis at L3-L4 and L5-S1 and anterolisthesis at L4-L5.  Vertebrae: Stable vertebral body heights. No substantial marrow edema. Probable small vertebral body hemangioma at L1.  Conus medullaris and cauda equina: Conus extends to the L1 level. Conus and cauda equina appear normal.  Paraspinal and other soft tissues: Unremarkable apart from chronic postoperative changes.  Disc levels:  L1-L2:  No canal or foraminal stenosis.  L2-L3:  No canal or foraminal stenosis.  L3-L4: Disc bulge with superimposed right foraminal protrusion, endplate osteophytic ridging, and mild facet arthropathy with ligamentum flavum infolding. Mild canal stenosis. Partial effacement of the subarticular recesses. Increased mild to moderate right foraminal stenosis. Similar mild left foraminal stenosis.  L4-L5: Anterolisthesis with uncovering of disc bulge with superimposed right foraminal protrusion, endplate osteophytic ridging, and marked facet arthropathy with ligamentum flavum infolding. Increased marked canal stenosis with effacement of the subarticular recesses. Similar moderate foraminal stenosis, right greater than left.  L5-S1: Prior left laminectomy with unchanged small cyst/chronic fluid collection within the laminectomy bed. Disc bulge slightly eccentric to the right, endplate osteophytic ridging, and facet arthropathy with residual ligamentum flavum infolding. Mild canal stenosis. Partial effacement the subarticular recesses. Mild foraminal stenosis.  Appearance is similar.  IMPRESSION: Lower lumbar degenerative changes as detailed above with some progression since 2019. Notably, there is increased canal stenosis at L4-L5 with subarticular recess effacement. Foraminal stenosis remains greatest at this level as well.   Electronically Signed   By: Guadlupe Spanish M.D.   On: 07/31/2020 09:31 ---  MRI CERVICAL SPINE WITHOUT CONTRAST    IMPRESSION:  1. The symptomatic abnormality is favored to be acute exacerbation  of chronic facet joint arthritis on the right at C2-C3. Marrow edema  there is superimposed on chronic disc and posterior element  degeneration which results in mild spinal stenosis and up to  moderate right C3 foraminal stenosis.  2. Chronic C6-C7 ACDF with solid arthrodesis.  3. Advanced disc and endplate degeneration C3-C4 through C5-C6 with  multifactorial spinal stenosis at each level. Up to moderate  associated spinal cord mass effect, but no cord signal abnormality.  Severe bilateral C4, C5 and left greater than right C6 foraminal  stenosis.  4. C7-T1 degeneration with mild spinal stenosis, severe left C8  foraminal stenosis.      Electronically Signed  By: Odessa Fleming M.D.  On: 07/21/2019 08:33     Objective:  VS:  HT:    WT:   BMI:     BP:(!) 177/76  HR:60bpm  TEMP: ( )  RESP:  Physical Exam Constitutional:      General: He is not in acute distress.    Appearance: Normal appearance. He is not ill-appearing.  HENT:     Head: Normocephalic and atraumatic.     Right Ear: External ear normal.  Left Ear: External ear normal.  Eyes:     Extraocular Movements: Extraocular movements intact.  Cardiovascular:     Rate and Rhythm: Normal rate.     Pulses: Normal pulses.  Abdominal:     General: There is no distension.     Palpations: Abdomen is soft.  Musculoskeletal:        General: No tenderness or signs of injury.     Right lower leg: No edema.     Left lower leg: No edema.      Comments: Patient has good distal strength without clonus.  Skin:    Findings: No erythema or rash.  Neurological:     General: No focal deficit present.     Mental Status: He is alert and oriented to person, place, and time.     Sensory: No sensory deficit.     Motor: No weakness or abnormal muscle tone.     Coordination: Coordination normal.  Psychiatric:        Mood and Affect: Mood normal.        Behavior: Behavior normal.      Imaging: No results found.

## 2020-09-14 NOTE — Progress Notes (Signed)
Adrian Cox - 59 y.o. male MRN [id number]  Date of birth: 1961-06-05  Office Visit Note: Visit Date: 08/11/2020 PCP: Excell Seltzer, MD Referred by: Excell Seltzer, MD  Subjective: Chief Complaint  Patient presents with  . Lower Back - Pain   HPI: Adrian Cox is a 59 y.o. male who comes in today For evaluation management of chronic worsening low back pain and bilateral buttock pain with neurogenic claudication.  Patient has history of lumbar stenosis at L4-5 which is multifactorial.  He has undergone various epidural injections at that level with some success rate at times but not doing that well recently.  We did order updated MRI of the lumbar spine this is reviewed with the patient today with spine models and is reviewed below.  Patient reports 10 out of 10 pain despite pain medications and adjunctive pain medication as well as injection and therapy.  In the past the patient has had referrals to Dr. Coletta Memos at [facility] as well as Dr. Venita Lick at [facility].  Patient has not had any focal weakness bowel or bladder difficulties or red flag complaints other than just chronic worsening severe pain.  Review of Systems  Musculoskeletal: Positive for back pain.       Bilateral radicular leg pain  All other systems reviewed and are negative.  Otherwise per HPI.  Assessment & Plan: Visit Diagnoses:  1. Spinal stenosis of lumbar region with neurogenic claudication   2. Lumbar radiculopathy   3. Chronic pain syndrome     Plan: Findings:  Chronic worsening severe low back pain with bilateral radicular pain and neurogenic claudication fashion worse with standing and ambulating.  Has not improved with any conservative measures or injections to any significant length of time recently.  Updated MRI does show worsening stenosis at L4-5 which is quite severe at this point.  At this point I recommend that he seek surgical referral for lumbar  decompression.  He has had referral to Dr. Coletta Memos and Dr. Venita Lick in the past.  He can see either 1 of those or we can get him to see Dr. Otelia Sergeant in our office.  In the interim we will try to repeat the L4 transforaminal epidural steroid injection.    Meds & Orders: No orders of the defined types were placed in this encounter.  No orders of the defined types were placed in this encounter.   Follow-up: Return if symptoms worsen or fail to improve.   Procedures: No procedures performed  No notes on file   Clinical History: MRI LUMBAR SPINE WITHOUT CONTRAST  TECHNIQUE: Multiplanar, multisequence MR imaging of the lumbar spine was performed. No intravenous contrast was administered.  COMPARISON:  2019  FINDINGS: Segmentation:  Standard.  Alignment: Stable with mild retrolisthesis at L3-L4 and L5-S1 and anterolisthesis at L4-L5.  Vertebrae: Stable vertebral body heights. No substantial marrow edema. Probable small vertebral body hemangioma at L1.  Conus medullaris and cauda equina: Conus extends to the L1 level. Conus and cauda equina appear normal.  Paraspinal and other soft tissues: Unremarkable apart from chronic postoperative changes.  Disc levels:  L1-L2:  No canal or foraminal stenosis.  L2-L3:  No canal or foraminal stenosis.  L3-L4: Disc bulge with superimposed right foraminal protrusion, endplate osteophytic ridging, and mild facet arthropathy with ligamentum flavum infolding. Mild canal stenosis. Partial effacement of the subarticular recesses. Increased mild to moderate right foraminal stenosis. Similar mild left foraminal stenosis.  L4-L5:  Anterolisthesis with uncovering of disc bulge with superimposed right foraminal protrusion, endplate osteophytic ridging, and marked facet arthropathy with ligamentum flavum infolding. Increased marked canal stenosis with effacement of the subarticular recesses. Similar moderate foraminal stenosis,  right greater than left.  L5-S1: Prior left laminectomy with unchanged small cyst/chronic fluid collection within the laminectomy bed. Disc bulge slightly eccentric to the right, endplate osteophytic ridging, and facet arthropathy with residual ligamentum flavum infolding. Mild canal stenosis. Partial effacement the subarticular recesses. Mild foraminal stenosis. Appearance is similar.  IMPRESSION: Lower lumbar degenerative changes as detailed above with some progression since 2019. Notably, there is increased canal stenosis at L4-L5 with subarticular recess effacement. Foraminal stenosis remains greatest at this level as well.   Electronically Signed   By: Guadlupe Spanish M.D.   On: 07/31/2020 09:31 ---  MRI CERVICAL SPINE WITHOUT CONTRAST    IMPRESSION:  1. The symptomatic abnormality is favored to be acute exacerbation  of chronic facet joint arthritis on the right at C2-C3. Marrow edema  there is superimposed on chronic disc and posterior element  degeneration which results in mild spinal stenosis and up to  moderate right C3 foraminal stenosis.  2. Chronic C6-C7 ACDF with solid arthrodesis.  3. Advanced disc and endplate degeneration C3-C4 through C5-C6 with  multifactorial spinal stenosis at each level. Up to moderate  associated spinal cord mass effect, but no cord signal abnormality.  Severe bilateral C4, C5 and left greater than right C6 foraminal  stenosis.  4. C7-T1 degeneration with mild spinal stenosis, severe left C8  foraminal stenosis.      Electronically Signed  By: Odessa Fleming M.D.  On: 07/21/2019 08:33   He reports that he has never smoked. He has never used smokeless tobacco. No results for input(s): HGBA1C, LABURIC in the last 8760 hours.  Objective:  VS:  HT:    WT:   BMI:     BP:(!) 145/75  HR:70bpm  TEMP: ( )  RESP:  Physical Exam Constitutional:      General: He is not in acute distress.    Appearance: Normal appearance. He is not  ill-appearing.  HENT:     Head: Normocephalic and atraumatic.     Right Ear: External ear normal.     Left Ear: External ear normal.  Eyes:     Extraocular Movements: Extraocular movements intact.  Cardiovascular:     Rate and Rhythm: Normal rate.     Pulses: Normal pulses.  Abdominal:     General: There is no distension.     Palpations: Abdomen is soft.  Musculoskeletal:        General: No tenderness or signs of injury.     Right lower leg: No edema.     Left lower leg: No edema.     Comments: Patient has good distal strength without clonus.  Skin:    Findings: No erythema or rash.  Neurological:     General: No focal deficit present.     Mental Status: He is alert and oriented to person, place, and time.     Sensory: No sensory deficit.     Motor: No weakness or abnormal muscle tone.     Coordination: Coordination normal.  Psychiatric:        Mood and Affect: Mood normal.        Behavior: Behavior normal.     Ortho Exam  Imaging: No results found.  Past Medical/Family/Surgical/Social History: Medications & Allergies reviewed per EMR, new medications updated. Patient Active  Problem List   Diagnosis Date Noted  . Spinal stenosis of lumbar region with neurogenic claudication 06/03/2020  . Acute pain of left shoulder 05/07/2020  . Microscopic hematuria 02/07/2020  . Erectile dysfunction 01/31/2020  . Cervical spondylosis without myelopathy 10/31/2019  . Post laminectomy syndrome 10/31/2019  . Constipation due to opioid therapy 09/20/2019  . Excessive sweating 09/20/2019  . Left leg pain 02/28/2019  . Elevated liver function tests 11/20/2018  . Chronic pain syndrome 11/20/2018  . Cervical radiculopathy 09/05/2018  . Lumbar radiculopathy 08/17/2018  . Pain in right thigh 03/27/2018  . Lateral knee pain, right 03/27/2018  . 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 Chronic low back pain with sciatica 11/09/2017  . Osteoarthritis of spine 02/23/2017  . Chronic right  shoulder pain 01/09/2017  . Essential hypertension 08/17/2016  . Bipolar disorder (HCC) 08/17/2016  . Hyperlipidemia 08/17/2016   Past Medical History:  Diagnosis Date  . Depression    bipolar  . Diverticulosis   . Hyperlipidemia   . Hypertension   . Mold exposure   . Sciatic nerve pain    Family History  Problem Relation Age of Onset  . Colon cancer Mother 25  . Hypertension Mother   . Alzheimer's disease Father   . Hypertension Father   . Hypertension Sister   . Diabetes Sister   . Diabetes Maternal Grandmother   . Hypertension Maternal Grandmother   . Hypertension Sister   . Diabetes Sister    Past Surgical History:  Procedure Laterality Date  . BACK SURGERY    . CERVICAL SPINE SURGERY  2003   fusion  . KNEE ARTHROSCOPY Right   . KNEE ARTHROSCOPY Left   . SPINE SURGERY     Social History   Occupational History  . Occupation: retired  Tobacco Use  . Smoking status: Never Smoker  . Smokeless tobacco: Never Used  Vaping Use  . Vaping Use: Never used  Substance and Sexual Activity  . Alcohol use: No  . Drug use: Yes    Types: Marijuana    Comment: Hx marijuana use - none x 2 yrs  . Sexual activity: Yes

## 2020-09-15 NOTE — Telephone Encounter (Signed)
Needs pain management visit for oxycodone refill.

## 2020-09-15 NOTE — Telephone Encounter (Signed)
Last office visit 05/07/2020 for Leg/Shoulder Pain.  Last refilled 05/13/2020 for #30 with no refills.  UDS/Contract 01/31/2020.  No future appointments.

## 2020-09-22 ENCOUNTER — Ambulatory Visit (INDEPENDENT_AMBULATORY_CARE_PROVIDER_SITE_OTHER): Payer: Medicare Other | Admitting: Family Medicine

## 2020-09-22 ENCOUNTER — Other Ambulatory Visit: Payer: Self-pay

## 2020-09-22 VITALS — BP 130/68 | HR 91 | Temp 98.4°F | Ht 71.5 in | Wt 211.8 lb

## 2020-09-22 DIAGNOSIS — Z23 Encounter for immunization: Secondary | ICD-10-CM

## 2020-09-22 DIAGNOSIS — N529 Male erectile dysfunction, unspecified: Secondary | ICD-10-CM

## 2020-09-22 DIAGNOSIS — G894 Chronic pain syndrome: Secondary | ICD-10-CM | POA: Diagnosis not present

## 2020-09-22 DIAGNOSIS — M5441 Lumbago with sciatica, right side: Secondary | ICD-10-CM

## 2020-09-22 DIAGNOSIS — F1120 Opioid dependence, uncomplicated: Secondary | ICD-10-CM | POA: Diagnosis not present

## 2020-09-22 DIAGNOSIS — G8929 Other chronic pain: Secondary | ICD-10-CM

## 2020-09-22 MED ORDER — VARDENAFIL HCL 20 MG PO TABS: 20.0000 mg | ORAL_TABLET | Freq: Every day | ORAL | 0 refills | Status: DC | PRN

## 2020-09-22 MED ORDER — OXYCODONE HCL 5 MG PO CAPS
5.0000 mg | ORAL_CAPSULE | Freq: Every day | ORAL | 0 refills | Status: DC | PRN
Start: 2020-09-22 — End: 2020-11-22

## 2020-09-22 MED ORDER — OXYCODONE HCL 5 MG PO CAPS
5.0000 mg | ORAL_CAPSULE | Freq: Every day | ORAL | 0 refills | Status: DC | PRN
Start: 2020-09-22 — End: 2020-09-22

## 2020-09-22 MED ORDER — OXYCODONE HCL 5 MG PO CAPS: 5.0000 mg | ORAL_CAPSULE | Freq: Every day | ORAL | 0 refills | Status: DC | PRN

## 2020-09-22 NOTE — Patient Instructions (Addendum)
Trial of Levitra.. call if not working as discussed.  Work on core strengthening, low back stretching.

## 2020-09-22 NOTE — Assessment & Plan Note (Signed)
No red flags.  PDMP reviewed. Refilled Oxycodone # 30 for 30 days. 3 RFs.

## 2020-09-22 NOTE — Assessment & Plan Note (Addendum)
Cialis max ineffective.  Good desire.  Trial of Levitra max dose.  If not improving consider testosterone eval or referral to URO.

## 2020-09-22 NOTE — Progress Notes (Signed)
Chief Complaint  Patient presents with  . Pain Management    History of Present Illness: HPI    32 year presents for  chronic pain management.  Indication for chronic opioid: chronic low back pain  On lyrica max BID.  Follwed by Dr. Alvester Morin s/p recent [facility] 08/13/2020 Medication and dose: oxycodone 5 mg daily prn # pills per month: last fill 08/10/2020 # 30 Last UDS date: 01/31/2020 Opioid Treatment Agreement signed (Y/N): Y Opioid Treatment Agreement last reviewed with patient:  yes NCCSRS reviewed this encounter (include red flags):       This visit occurred during the SARS-CoV-2 public health emergency.  Safety protocols were in place, including screening questions prior to the visit, additional usage of staff PPE, and extensive cleaning of exam room while observing appropriate contact time as indicated for disinfecting solutions.   COVID 19 screen:  No recent travel or known exposure to COVID19 The patient denies respiratory symptoms of COVID 19 at this time. The importance of social distancing was discussed today.     ROS    Past Medical History:  Diagnosis Date  . Depression    bipolar  . Diverticulosis   . Hyperlipidemia   . Hypertension   . Mold exposure   . Sciatic nerve pain     reports that he has never smoked. He has never used smokeless tobacco. He reports current drug use. Drug: Marijuana. He reports that he does not drink alcohol.   Current Outpatient Medications:  .  atorvastatin (LIPITOR) 10 MG tablet, Take 1 tablet (10 mg total) by mouth daily., Disp: 90 tablet, Rfl: 3 .  Cholecalciferol (VITAMIN D) 50 MCG (2000 UT) tablet, Take 2,000 Units by mouth daily., Disp: , Rfl:  .  cyclobenzaprine (FLEXERIL) 10 MG tablet, Take 1 tablet (10 mg total) by mouth at bedtime as needed for muscle spasms., Disp: 15 tablet, Rfl: 0 .  docusate sodium (COLACE) 100 MG capsule, Take 1 capsule (100 mg total) by mouth every 12 (twelve) hours., Disp: 60 capsule, Rfl: 2 .   FIBER PO, Take 2 tablets by mouth 3 (three) times daily. gummies, Disp: , Rfl:  .  glycerin adult 2 g suppository, Place 1 suppository rectally as needed for constipation., Disp: 12 suppository, Rfl: 2 .  lisinopril (ZESTRIL) 10 MG tablet, TAKE 2 TABLETS BY MOUTH EVERY DAY, Disp: 180 tablet, Rfl: 1 .  Multiple Vitamins-Minerals (MULTIVITAMIN ADULT PO), Take 2 tablets by mouth daily. gummies, Disp: , Rfl:  .  oxycodone (OXY-IR) 5 MG capsule, Take 1 capsule (5 mg total) by mouth daily as needed for pain. Do not fill until 07/14/2020, Disp: 30 capsule, Rfl: 0 .  polyethylene glycol (MIRALAX / GLYCOLAX) 17 g packet, Take 17 g by mouth daily., Disp: 14 each, Rfl: 3 .  pregabalin (LYRICA) 150 MG capsule, TAKE 1 CAPSULE BY MOUTH 3 TIMES DAILY., Disp: 90 capsule, Rfl: 0 .  tadalafil (CIALIS) 20 MG tablet, TAKE 1 TABLET BY MOUTH ONCE DAILY AS NEEDED FOR ERECTILE DYSFUNCTION, Disp: 10 tablet, Rfl: 2   Observations/Objective: Blood pressure 130/68, pulse 91, temperature 98.4 F (36.9 C), temperature source Temporal, height 5' 11.5" (1.816 m), weight 211 lb 12 oz (96 kg), SpO2 98 %.  Physical Exam Constitutional:      Appearance: He is well-developed.  HENT:     Head: Normocephalic.     Right Ear: Hearing normal.     Left Ear: Hearing normal.     Nose: Nose normal.  Neck:  Thyroid: No thyroid mass or thyromegaly.     Vascular: No carotid bruit.     Trachea: Trachea normal.  Cardiovascular:     Rate and Rhythm: Normal rate and regular rhythm.     Pulses: Normal pulses.     Heart sounds: Heart sounds not distant. No murmur heard.  No friction rub. No gallop.      Comments: No peripheral edema Pulmonary:     Effort: Pulmonary effort is normal. No respiratory distress.     Breath sounds: Normal breath sounds.  Skin:    General: Skin is warm and dry.     Findings: No rash.  Psychiatric:        Speech: Speech normal.        Behavior: Behavior normal.        Thought Content: Thought content  normal.      Assessment and Plan   Chronic pain syndrome No red flags.  PDMP reviewed. Refilled Oxycodone # 30 for 30 days. 3 RFs.    Narcotic dependence (HCC) UDS  And contract uptodate.  Erectile dysfunction Cialis max ineffective.  Good desire.  Trial of Levitra max dose.  If not improving consider testosterone eval or referral to URO.   Kerby Nora, MD

## 2020-09-22 NOTE — Assessment & Plan Note (Signed)
UDS  And contract uptodate.

## 2020-10-06 ENCOUNTER — Other Ambulatory Visit: Payer: Self-pay | Admitting: Family Medicine

## 2020-10-14 ENCOUNTER — Other Ambulatory Visit: Payer: Self-pay | Admitting: Family Medicine

## 2020-10-15 NOTE — Telephone Encounter (Signed)
Last office visit 09/22/2020 for pain management.  Last refill 09/07/2020 for #90 with no refills.  No future appointments.

## 2020-10-19 ENCOUNTER — Ambulatory Visit: Payer: Self-pay | Admitting: *Deleted

## 2020-10-19 NOTE — Chronic Care Management (AMB) (Signed)
  Chronic Care Management   Social Work Note  10/19/2020 Name: Adrian Cox MRN: [id number] DOB: 10/26/1960  Adrian Cox is a 60 y.o. year old male who sees Excell Seltzer, MD for primary care. The CCM team was consulted for assistance with [employer].  Per patient, he is currently renting a room from a friend, however would like information on affordable housing elsewhere. Applying for Section 8 discussed as well contacting the [facility] and  [employer]. [place] [employer] referral also made with patient's consent.  SDOH (Social Determinants of Health) assessments performed: Yes     Outpatient Encounter Medications as of 10/19/2020  Medication Sig  . atorvastatin (LIPITOR) 10 MG tablet Take 1 tablet (10 mg total) by mouth daily.  . Cholecalciferol (VITAMIN D) 50 MCG (2000 UT) tablet Take 2,000 Units by mouth daily.  . cyclobenzaprine (FLEXERIL) 10 MG tablet Take 1 tablet (10 mg total) by mouth at bedtime as needed for muscle spasms.  Marland Kitchen docusate sodium (COLACE) 100 MG capsule Take 1 capsule (100 mg total) by mouth every 12 (twelve) hours.  Marland Kitchen FIBER PO Take 2 tablets by mouth 3 (three) times daily. gummies  . glycerin adult 2 g suppository Place 1 suppository rectally as needed for constipation.  Marland Kitchen lisinopril (ZESTRIL) 10 MG tablet TAKE 2 TABLETS BY MOUTH EVERY DAY  . Multiple Vitamins-Minerals (MULTIVITAMIN ADULT PO) Take 2 tablets by mouth daily. gummies  . oxycodone (OXY-IR) 5 MG capsule Take 1 capsule (5 mg total) by mouth daily as needed for pain. Do not fill until 11/23/2020  . polyethylene glycol (MIRALAX / GLYCOLAX) 17 g packet Take 17 g by mouth daily.  . pregabalin (LYRICA) 150 MG capsule TAKE 1 CAPSULE BY MOUTH THREE TIMES A DAY  . tadalafil (CIALIS) 20 MG tablet TAKE 1 TABLET BY MOUTH ONCE DAILY AS NEEDED FOR ERECTILE DYSFUNCTION  . vardenafil (LEVITRA) 20 MG tablet TAKE 1 TABLET BY MOUTH ONCE DAILY AS NEEDED FOR ERECTILE DYSFUNCTION   No  facility-administered encounter medications on file as of 10/19/2020.    Goals Addressed   None     Follow Up Plan: patient will follow up with community resources provided for housing  [place], LCSW [place] [phone]

## 2020-10-29 ENCOUNTER — Telehealth: Payer: Self-pay

## 2020-10-29 DIAGNOSIS — Z1152 Encounter for screening for COVID-19: Secondary | ICD-10-CM | POA: Diagnosis not present

## 2020-10-29 NOTE — Telephone Encounter (Signed)
Pt said on 10/23/20 pt had fever 100;chills,severe muscle pain below the waist and down legs; pt had dry cough, and slight CP from 10/23/20 - 10/25/20.pt had diarrhea on 10/26/20.s/t,runny nose. Today the only symptoms pt is having is dry cough and chest congestion. Pt has not had SOB and has not lost sense of taste or smell. Pt said he wants to get a covid test today due to wife tested positive for covid 10/29/20. Pt will go to [facility] in [place] for covid testing now. Pt will drink plenty of fluids, rest, tylenol if spikes fever and self quarantine for at least 5 days. Pt will cb with covid results. Sending note to Dr Ermalene Searing and Lupita Leash CMA.

## 2020-10-30 NOTE — Telephone Encounter (Signed)
Agree with dispo 

## 2020-11-03 DIAGNOSIS — Z1152 Encounter for screening for COVID-19: Secondary | ICD-10-CM | POA: Diagnosis not present

## 2020-11-05 ENCOUNTER — Telehealth: Payer: Self-pay

## 2020-11-05 DIAGNOSIS — Z1152 Encounter for screening for COVID-19: Secondary | ICD-10-CM | POA: Diagnosis not present

## 2020-11-05 NOTE — Telephone Encounter (Signed)
Pt left a VM stating he was spitting up blood and needed a call back. I called him back and he was saying he was arguing with his wife and she said he had blood coming from his mouth but he said it was clearing up. I was trying to understand the situation and his phone cut off.   Was able to get back in touch with him. He said he clears out his sinuses with saline solution and it goes down in his throat and it makes him cough sometimes. Thinks the blood came from that. He said he is doing better and I did not need to pass this on to Dr Ermalene Searing.

## 2020-11-17 ENCOUNTER — Ambulatory Visit: Payer: Self-pay | Admitting: *Deleted

## 2020-11-17 NOTE — Chronic Care Management (AMB) (Signed)
  Chronic Care Management   Social Work Note  11/17/2020 Name: Adrian Cox MRN: [id number] DOB: 1961/09/22  SAVVAS ROPER is a 60 y.o. year old male who sees Excell Seltzer, MD for primary care.  Housing Resources mailed to patient's home upon his request.  SDOH (Social Determinants of Health) assessments performed: No     Outpatient Encounter Medications as of 11/17/2020  Medication Sig  . atorvastatin (LIPITOR) 10 MG tablet Take 1 tablet (10 mg total) by mouth daily.  . Cholecalciferol (VITAMIN D) 50 MCG (2000 UT) tablet Take 2,000 Units by mouth daily.  . cyclobenzaprine (FLEXERIL) 10 MG tablet Take 1 tablet (10 mg total) by mouth at bedtime as needed for muscle spasms.  Marland Kitchen docusate sodium (COLACE) 100 MG capsule Take 1 capsule (100 mg total) by mouth every 12 (twelve) hours.  Marland Kitchen FIBER PO Take 2 tablets by mouth 3 (three) times daily. gummies  . glycerin adult 2 g suppository Place 1 suppository rectally as needed for constipation.  Marland Kitchen lisinopril (ZESTRIL) 10 MG tablet TAKE 2 TABLETS BY MOUTH EVERY DAY  . Multiple Vitamins-Minerals (MULTIVITAMIN ADULT PO) Take 2 tablets by mouth daily. gummies  . oxycodone (OXY-IR) 5 MG capsule Take 1 capsule (5 mg total) by mouth daily as needed for pain. Do not fill until 11/23/2020  . polyethylene glycol (MIRALAX / GLYCOLAX) 17 g packet Take 17 g by mouth daily.  . pregabalin (LYRICA) 150 MG capsule TAKE 1 CAPSULE BY MOUTH THREE TIMES A DAY  . tadalafil (CIALIS) 20 MG tablet TAKE 1 TABLET BY MOUTH ONCE DAILY AS NEEDED FOR ERECTILE DYSFUNCTION  . vardenafil (LEVITRA) 20 MG tablet TAKE 1 TABLET BY MOUTH ONCE DAILY AS NEEDED FOR ERECTILE DYSFUNCTION   No facility-administered encounter medications on file as of 11/17/2020.    Goals Addressed   None     [place], [place] Clinical Care Management [phone]

## 2020-11-22 ENCOUNTER — Other Ambulatory Visit: Payer: Self-pay

## 2020-11-22 ENCOUNTER — Other Ambulatory Visit: Payer: Self-pay | Admitting: Family Medicine

## 2020-11-23 NOTE — Telephone Encounter (Signed)
Last office visit 09/22/2020 for pain management..  Last refilled 09/22/2020 for #30 with no refills.  No future appointments with PCP.

## 2020-11-23 NOTE — Telephone Encounter (Signed)
Last office visit 09/22/2020 for pain management.  Last refilled 10/15/2020 for #90 with no refills.  No future appointments.

## 2020-11-24 MED ORDER — OXYCODONE HCL 5 MG PO CAPS
5.0000 mg | ORAL_CAPSULE | Freq: Every day | ORAL | 0 refills | Status: DC | PRN
Start: 2020-11-24 — End: 2021-03-11

## 2020-12-01 ENCOUNTER — Ambulatory Visit: Payer: Medicare Other | Admitting: Family Medicine

## 2020-12-07 ENCOUNTER — Ambulatory Visit (INDEPENDENT_AMBULATORY_CARE_PROVIDER_SITE_OTHER): Payer: Medicare Other | Admitting: Psychology

## 2020-12-07 DIAGNOSIS — F331 Major depressive disorder, recurrent, moderate: Secondary | ICD-10-CM

## 2020-12-09 ENCOUNTER — Other Ambulatory Visit: Payer: Self-pay | Admitting: Family Medicine

## 2020-12-14 ENCOUNTER — Ambulatory Visit (INDEPENDENT_AMBULATORY_CARE_PROVIDER_SITE_OTHER): Payer: Medicare Other | Admitting: Psychology

## 2020-12-14 DIAGNOSIS — F411 Generalized anxiety disorder: Secondary | ICD-10-CM

## 2020-12-21 ENCOUNTER — Ambulatory Visit: Payer: Medicare Other | Admitting: Psychology

## 2020-12-23 ENCOUNTER — Ambulatory Visit (INDEPENDENT_AMBULATORY_CARE_PROVIDER_SITE_OTHER): Payer: Medicare Other | Admitting: Psychology

## 2020-12-23 DIAGNOSIS — F411 Generalized anxiety disorder: Secondary | ICD-10-CM

## 2020-12-26 ENCOUNTER — Encounter (HOSPITAL_COMMUNITY): Payer: Self-pay | Admitting: Emergency Medicine

## 2020-12-26 ENCOUNTER — Other Ambulatory Visit: Payer: Self-pay

## 2020-12-26 ENCOUNTER — Emergency Department (HOSPITAL_COMMUNITY)
Admission: EM | Admit: 2020-12-26 | Discharge: 2020-12-26 | Disposition: A | Discharge status: Home / Self Care | Payer: Medicare Other | Attending: Emergency Medicine | Admitting: Emergency Medicine

## 2020-12-26 DIAGNOSIS — M4800 Spinal stenosis, site unspecified: Secondary | ICD-10-CM | POA: Insufficient documentation

## 2020-12-26 DIAGNOSIS — I10 Essential (primary) hypertension: Secondary | ICD-10-CM | POA: Diagnosis not present

## 2020-12-26 DIAGNOSIS — M5442 Lumbago with sciatica, left side: Secondary | ICD-10-CM | POA: Diagnosis not present

## 2020-12-26 DIAGNOSIS — M545 Low back pain, unspecified: Secondary | ICD-10-CM | POA: Diagnosis present

## 2020-12-26 DIAGNOSIS — M5432 Sciatica, left side: Secondary | ICD-10-CM

## 2020-12-26 DIAGNOSIS — Z79899 Other long term (current) drug therapy: Secondary | ICD-10-CM | POA: Insufficient documentation

## 2020-12-26 DIAGNOSIS — M48061 Spinal stenosis, lumbar region without neurogenic claudication: Secondary | ICD-10-CM | POA: Diagnosis not present

## 2020-12-26 HISTORY — DX: Spinal stenosis, site unspecified: M48.00

## 2020-12-26 MED ORDER — DEXAMETHASONE SODIUM PHOSPHATE 10 MG/ML IJ SOLN
10.0000 mg | Freq: Once | INTRAMUSCULAR | Status: AC
  Administered 2020-12-26: 10 mg via INTRAMUSCULAR
  Filled 2020-12-26: qty 1

## 2020-12-26 MED ORDER — HYDROMORPHONE HCL 1 MG/ML IJ SOLN
1.0000 mg | Freq: Once | INTRAMUSCULAR | Status: AC
  Administered 2020-12-26: 1 mg via INTRAMUSCULAR
  Filled 2020-12-26: qty 1

## 2020-12-26 NOTE — ED Provider Notes (Signed)
[facility] EMERGENCY DEPARTMENT Provider Note   CSN: [id number] Arrival date & time: 12/26/20  0802     History Chief Complaint  Patient presents with  . Leg Pain  . Hip Pain    Adrian Cox is a 60 y.o. male.  HPI Patient reports he has a history of spinal stenosis and chronic back pain.  Typically this is controlled with home pain medications and epidural injections every 6 months.  Patient reports he is getting close to due for a another epidural injection.  He awakened this morning at about 4 AM and had a flare of pain in his lower back that is radiating down the left leg to behind the knee.  He reports initially when he got out of bed he felt a little weakness but now he has been up and walking and denies any lower extremity weakness or numbness.  No acute injuries he has otherwise been well.    Past Medical History:  Diagnosis Date  . Depression    bipolar  . Diverticulosis   . Hyperlipidemia   . Hypertension   . Mold exposure   . Sciatic nerve pain   . Spinal stenosis     Patient Active Problem List   Diagnosis Date Noted  . Narcotic dependence (HCC) 09/22/2020  . Spinal stenosis of lumbar region with neurogenic claudication 06/03/2020  . Acute pain of left shoulder 05/07/2020  . Microscopic hematuria 02/07/2020  . Erectile dysfunction 01/31/2020  . Cervical spondylosis without myelopathy 10/31/2019  . Post laminectomy syndrome 10/31/2019  . Constipation due to opioid therapy 09/20/2019  . Excessive sweating 09/20/2019  . Left leg pain 02/28/2019  . Elevated liver function tests 11/20/2018  . Chronic pain syndrome 11/20/2018  . Cervical radiculopathy 09/05/2018  . Lumbar radiculopathy 08/17/2018  . Pain in right thigh 03/27/2018  . Lateral knee pain, right 03/27/2018  . 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 Chronic low back pain with sciatica 11/09/2017  . Osteoarthritis of spine 02/23/2017  . Chronic right shoulder pain 01/09/2017   . Essential hypertension 08/17/2016  . Bipolar disorder (HCC) 08/17/2016  . Hyperlipidemia 08/17/2016    Past Surgical History:  Procedure Laterality Date  . BACK SURGERY    . CERVICAL SPINE SURGERY  2003   fusion  . KNEE ARTHROSCOPY Right   . KNEE ARTHROSCOPY Left   . SPINE SURGERY         Family History  Problem Relation Age of Onset  . Colon cancer Mother 77  . Hypertension Mother   . Alzheimer's disease Father   . Hypertension Father   . Hypertension Sister   . Diabetes Sister   . Diabetes Maternal Grandmother   . Hypertension Maternal Grandmother   . Hypertension Sister   . Diabetes Sister     Social History   Tobacco Use  . Smoking status: Never Smoker  . Smokeless tobacco: Never Used  Vaping Use  . Vaping Use: Never used  Substance Use Topics  . Alcohol use: No  . Drug use: Yes    Types: Marijuana    Comment: Hx marijuana use - none x 2 yrs    Home Medications Prior to Admission medications   Medication Sig Start Date End Date Taking? Authorizing Provider  oxycodone (OXY-IR) 5 MG capsule Take 1 capsule (5 mg total) by mouth daily as needed for pain. Do not fill until 11/23/2020 11/24/20   Excell Seltzer, MD  atorvastatin (LIPITOR) 10 MG tablet Take 1  tablet (10 mg total) by mouth daily. 02/07/20   Bedsole, Amy E, MD  Cholecalciferol (VITAMIN D) 50 MCG (2000 UT) tablet Take 2,000 Units by mouth daily.    [provider]  cyclobenzaprine (FLEXERIL) 10 MG tablet Take 1 tablet (10 mg total) by mouth at bedtime as needed for muscle spasms. 02/07/20   Bedsole, Amy E, MD  docusate sodium (COLACE) 100 MG capsule Take 1 capsule (100 mg total) by mouth every 12 (twelve) hours. 08/01/20   Arby Barrette, MD  FIBER PO Take 2 tablets by mouth 3 (three) times daily. gummies    [provider]  glycerin adult 2 g suppository Place 1 suppository rectally as needed for constipation. 08/01/20   Arby Barrette, MD  lisinopril (ZESTRIL) 10 MG tablet TAKE 2  TABLETS BY MOUTH EVERY DAY 12/09/20   Bedsole, Amy E, MD  Multiple Vitamins-Minerals (MULTIVITAMIN ADULT PO) Take 2 tablets by mouth daily. gummies    [provider]  polyethylene glycol (MIRALAX / GLYCOLAX) 17 g packet Take 17 g by mouth daily. 08/01/20   Arby Barrette, MD  pregabalin (LYRICA) 150 MG capsule TAKE 1 CAPSULE BY MOUTH THREE TIMES A DAY 11/23/20   Bedsole, Amy E, MD  tadalafil (CIALIS) 20 MG tablet TAKE 1 TABLET BY MOUTH ONCE DAILY AS NEEDED FOR ERECTILE DYSFUNCTION 08/11/20   Bedsole, Amy E, MD  vardenafil (LEVITRA) 20 MG tablet TAKE 1 TABLET BY MOUTH ONCE DAILY AS NEEDED FOR ERECTILE DYSFUNCTION 10/07/20   Excell Seltzer, MD    Allergies    Patient has no known allergies.  Review of Systems   Review of Systems 10 systems reviewed and negative except as per Physical Exam Updated Vital Signs BP (!) 177/84   Pulse 67   Temp 98 F (36.7 C) (Oral)   Resp 18   SpO2 95%   Physical Exam Constitutional:      Comments: Alert nontoxic and well in appearance.  HENT:     Mouth/Throat:     Pharynx: Oropharynx is clear.  Eyes:     Extraocular Movements: Extraocular movements intact.  Cardiovascular:     Rate and Rhythm: Normal rate and regular rhythm.  Pulmonary:     Effort: Pulmonary effort is normal.     Breath sounds: Normal breath sounds.  Abdominal:     General: There is no distension.     Palpations: Abdomen is soft.     Tenderness: There is no abdominal tenderness. There is no guarding.  Musculoskeletal:        General: No swelling or tenderness. Normal range of motion.     Right lower leg: No edema.     Left lower leg: No edema.  Skin:    General: Skin is warm and dry.  Neurological:     General: No focal deficit present.     Mental Status: He is oriented to person, place, and time.     Cranial Nerves: No cranial nerve deficit.     Motor: No weakness.     Coordination: Coordination normal.     Gait: Gait normal.  Psychiatric:        Mood and  Affect: Mood normal.     ED Results / Procedures / Treatments   Labs (all labs ordered are listed, but only abnormal results are displayed) Labs Reviewed - No data to display  EKG None  Radiology No results found.  Procedures Procedures   Medications Ordered in ED Medications  dexamethasone (DECADRON) injection 10 mg (  has no administration in time range)  HYDROmorphone (DILAUDID) injection 1 mg (has no administration in time range)    ED Course  I have reviewed the triage vital signs and the nursing notes.  Pertinent labs & imaging results that were available during my care of the patient were reviewed by me and considered in my medical decision making (see chart for details).    MDM Rules/Calculators/A&P                          Patient is alert and nontoxic.  He has known history of spinal stenosis with prior MRI in 2021.  He does get chronic pain management.  Patient presents with a flare of pain this morning.  We will treat acutely with a shot of Decadron and 1 Dilaudid in the emergency department.  Patient is to continue his home pain medication regimen and follow-up with his pain management clinic for reevaluation and ongoing care.  He is well in appearance today.  No signs of other acute etiologies such as infection, cauda equina or other emergent condition. Final Clinical Impression(s) / ED Diagnoses Final diagnoses:  Sciatica of left side  Spinal stenosis, unspecified spinal region    Rx / DC Orders ED Discharge Orders    None       Arby Barrette, MD 12/26/20 0900

## 2020-12-26 NOTE — Discharge Instructions (Signed)
1.  Follow-up with your pain management clinic as soon as possible. 2.  You have been given a shot of Decadron in the emergency department.  This should help with pain over the next week. 3.  Continue your home pain medication regimen. 4.  Return if you have weakness numbness of the leg, difficulty walking or other concerning symptoms.

## 2020-12-26 NOTE — ED Triage Notes (Addendum)
Woke up at 4am with R hip pain and L leg pain.  History of back pain.  No known injury.

## 2020-12-28 ENCOUNTER — Ambulatory Visit: Payer: Medicare Other | Admitting: Psychology

## 2021-01-07 ENCOUNTER — Ambulatory Visit: Payer: Self-pay

## 2021-01-07 ENCOUNTER — Encounter: Payer: Self-pay | Admitting: Physical Medicine and Rehabilitation

## 2021-01-07 ENCOUNTER — Other Ambulatory Visit: Payer: Self-pay

## 2021-01-07 ENCOUNTER — Ambulatory Visit (INDEPENDENT_AMBULATORY_CARE_PROVIDER_SITE_OTHER): Payer: Medicare Other | Admitting: Physical Medicine and Rehabilitation

## 2021-01-07 VITALS — BP 168/81 | HR 72

## 2021-01-07 DIAGNOSIS — M48062 Spinal stenosis, lumbar region with neurogenic claudication: Secondary | ICD-10-CM

## 2021-01-07 DIAGNOSIS — M5416 Radiculopathy, lumbar region: Secondary | ICD-10-CM | POA: Diagnosis not present

## 2021-01-07 MED ORDER — METHYLPREDNISOLONE ACETATE 80 MG/ML IJ SUSP
80.0000 mg | Freq: Once | INTRAMUSCULAR | Status: AC
  Administered 2021-01-07: 80 mg

## 2021-01-07 NOTE — Patient Instructions (Signed)

## 2021-01-07 NOTE — Progress Notes (Signed)
Pt state lower back pain that travels down his left to his calfs. Pt state sitting and standing makes the pain worse. Pt state movement and pain meds helps ease his pain. Pt has hx of inj on 08/13/20 pt state it worked for five months with 100% relief.  Numeric Pain Rating Scale and Functional Assessment Average Pain 7   In the last MONTH (on 0-10 scale) has pain interfered with the following?  1. General activity like being  able to carry out your everyday physical activities such as walking, climbing stairs, carrying groceries, or moving a chair?  Rating(10)   +Driver, -BT, -Dye Allergies.

## 2021-01-08 ENCOUNTER — Other Ambulatory Visit: Payer: Self-pay | Admitting: Family Medicine

## 2021-01-08 NOTE — Telephone Encounter (Signed)
Last office visit 09/22/2020 for Pain Management.  Last refilled 11/23/2020 for #90 with no refills.  No future appointments.

## 2021-01-19 ENCOUNTER — Other Ambulatory Visit: Payer: Self-pay

## 2021-01-19 ENCOUNTER — Ambulatory Visit (INDEPENDENT_AMBULATORY_CARE_PROVIDER_SITE_OTHER): Payer: Medicare Other | Admitting: Family Medicine

## 2021-01-19 ENCOUNTER — Encounter: Payer: Self-pay | Admitting: Family Medicine

## 2021-01-19 ENCOUNTER — Ambulatory Visit: Payer: Medicare Other

## 2021-01-19 ENCOUNTER — Ambulatory Visit (INDEPENDENT_AMBULATORY_CARE_PROVIDER_SITE_OTHER): Payer: Medicare Other

## 2021-01-19 DIAGNOSIS — Z23 Encounter for immunization: Secondary | ICD-10-CM | POA: Diagnosis not present

## 2021-01-19 NOTE — Progress Notes (Signed)
Per orders of Dr. Kerby Nora, injection of Shingles #2  given by Donnamarie Poag in left deltoid. Patient tolerated injection well.  Vis given and [facility] updated

## 2021-01-20 NOTE — Progress Notes (Signed)
Cancelled.  

## 2021-01-21 NOTE — Progress Notes (Signed)
Adrian Cox - 60 y.o. male MRN [id number]  Date of birth: 1961/10/11  Office Visit Note: Visit Date: 01/07/2021 PCP: Excell Seltzer, MD Referred by: Excell Seltzer, MD  Subjective: Chief Complaint  Patient presents with  . Lower Back - Pain  . Left Leg - Pain   HPI:  Adrian Cox is a 60 y.o. male who comes in today for planned repeat Left L4-L5 Lumbar epidural steroid injection with fluoroscopic guidance.  The patient has failed conservative care including home exercise, medications, time and activity modification.  This injection will be diagnostic and hopefully therapeutic.  Please see requesting physician notes for further details and justification. Prior injection did offer 100% relief for approximately 5 months.  Last injection was 08/13/2020.  Prior injection was bilateral L4 but today is really having only left-sided symptoms.  Over the last month he has had worsening symptoms from his severe stenosis.  As noted above and did give the patient more functional ability for activities of daily living and was also beneficial in that it did reduce their medication requirement.  They have had physical therapy and continue with home exercises.  Current medication management is not beneficial in increasing their functional status.  Procedures are done as part of a comprehensive orthopedic and pain management program with access to in-house orthopedics, spine surgery and physical therapy as well as access to [facility] biopsychosocial counseling if needed.  Referring: Dr. Kerby Nora   ROS Otherwise per HPI.  Assessment & Plan: Visit Diagnoses:    ICD-10-CM   1. Lumbar radiculopathy  M54.16 XR C-ARM NO REPORT    Epidural Steroid injection    methylPREDNISolone acetate (DEPO-MEDROL) injection 80 mg  2. Spinal stenosis of lumbar region with neurogenic claudication  M48.062 XR C-ARM NO REPORT    Epidural Steroid injection    methylPREDNISolone acetate (DEPO-MEDROL)  injection 80 mg    Plan: No additional findings.   Meds & Orders:  Meds ordered this encounter  Medications  . methylPREDNISolone acetate (DEPO-MEDROL) injection 80 mg    Orders Placed This Encounter  Procedures  . XR C-ARM NO REPORT  . Epidural Steroid injection    Follow-up: Return if symptoms worsen or fail to improve.   Procedures: No procedures performed  Lumbosacral Transforaminal Epidural Steroid Injection - Sub-Pedicular Approach with Fluoroscopic Guidance  [name]      Date of Birth: 01/27/1961 MRN: [id number] PCP: Excell Seltzer, MD      Visit Date: 01/07/2021   Universal Protocol:    Date/Time: 01/07/2021  Consent Given By: the patient  Position: PRONE  Additional Comments: Vital signs were monitored before and after the procedure. Patient was prepped and draped in the usual sterile fashion. The correct patient, procedure, and site was verified.   Injection Procedure Details:   Procedure diagnoses: Lumbar radiculopathy [M54.16]    Meds Administered:  Meds ordered this encounter  Medications  . methylPREDNISolone acetate (DEPO-MEDROL) injection 80 mg    Laterality: Left  Location/Site:  L4-L5  Needle:5.0 in., 22 ga.  Short bevel or Quincke spinal needle  Needle Placement: Transforaminal  Findings:    -Comments: Excellent flow of contrast along the nerve, nerve root and into the epidural space.  Procedure Details: After squaring off the end-plates to get a true AP view, the C-arm was positioned so that an oblique view of the foramen as noted above was visualized. The target area is just inferior to the "nose of the  scotty dog" or sub pedicular. The soft tissues overlying this structure were infiltrated with 2-3 ml. of 1% Lidocaine without Epinephrine.  The spinal needle was inserted toward the target using a "trajectory" view along the fluoroscope beam.  Under AP and lateral visualization, the needle was advanced so it did not  puncture dura and was located close the 6 O'Clock position of the pedical in AP tracterory. Biplanar projections were used to confirm position. Aspiration was confirmed to be negative for CSF and/or blood. A 1-2 ml. volume of Isovue-250 was injected and flow of contrast was noted at each level. Radiographs were obtained for documentation purposes.   After attaining the desired flow of contrast documented above, a 0.5 to 1.0 ml test dose of 0.25% Marcaine was injected into each respective transforaminal space.  The patient was observed for 90 seconds post injection.  After no sensory deficits were reported, and normal lower extremity motor function was noted,   the above injectate was administered so that equal amounts of the injectate were placed at each foramen (level) into the transforaminal epidural space.   Additional Comments:  The patient tolerated the procedure well Dressing: 2 x 2 sterile gauze and Band-Aid    Post-procedure details: Patient was observed during the procedure. Post-procedure instructions were reviewed.  Patient left the clinic in stable condition.      Clinical History: MRI LUMBAR SPINE WITHOUT CONTRAST  TECHNIQUE: Multiplanar, multisequence MR imaging of the lumbar spine was performed. No intravenous contrast was administered.  COMPARISON:  2019  FINDINGS: Segmentation:  Standard.  Alignment: Stable with mild retrolisthesis at L3-L4 and L5-S1 and anterolisthesis at L4-L5.  Vertebrae: Stable vertebral body heights. No substantial marrow edema. Probable small vertebral body hemangioma at L1.  Conus medullaris and cauda equina: Conus extends to the L1 level. Conus and cauda equina appear normal.  Paraspinal and other soft tissues: Unremarkable apart from chronic postoperative changes.  Disc levels:  L1-L2:  No canal or foraminal stenosis.  L2-L3:  No canal or foraminal stenosis.  L3-L4: Disc bulge with superimposed right foraminal  protrusion, endplate osteophytic ridging, and mild facet arthropathy with ligamentum flavum infolding. Mild canal stenosis. Partial effacement of the subarticular recesses. Increased mild to moderate right foraminal stenosis. Similar mild left foraminal stenosis.  L4-L5: Anterolisthesis with uncovering of disc bulge with superimposed right foraminal protrusion, endplate osteophytic ridging, and marked facet arthropathy with ligamentum flavum infolding. Increased marked canal stenosis with effacement of the subarticular recesses. Similar moderate foraminal stenosis, right greater than left.  L5-S1: Prior left laminectomy with unchanged small cyst/chronic fluid collection within the laminectomy bed. Disc bulge slightly eccentric to the right, endplate osteophytic ridging, and facet arthropathy with residual ligamentum flavum infolding. Mild canal stenosis. Partial effacement the subarticular recesses. Mild foraminal stenosis. Appearance is similar.  IMPRESSION: Lower lumbar degenerative changes as detailed above with some progression since 2019. Notably, there is increased canal stenosis at L4-L5 with subarticular recess effacement. Foraminal stenosis remains greatest at this level as well.   Electronically Signed   By: Guadlupe Spanish M.D.   On: 07/31/2020 09:31 ---  MRI CERVICAL SPINE WITHOUT CONTRAST    IMPRESSION:  1. The symptomatic abnormality is favored to be acute exacerbation  of chronic facet joint arthritis on the right at C2-C3. Marrow edema  there is superimposed on chronic disc and posterior element  degeneration which results in mild spinal stenosis and up to  moderate right C3 foraminal stenosis.  2. Chronic C6-C7 ACDF with solid arthrodesis.  3. Advanced disc and endplate degeneration C3-C4 through C5-C6 with  multifactorial spinal stenosis at each level. Up to moderate  associated spinal cord mass effect, but no cord signal abnormality.  Severe  bilateral C4, C5 and left greater than right C6 foraminal  stenosis.  4. C7-T1 degeneration with mild spinal stenosis, severe left C8  foraminal stenosis.      Electronically Signed  By: Odessa Fleming M.D.  On: 07/21/2019 08:33     Objective:  VS:  HT:    WT:   BMI:     BP:(!) 168/81  HR:72bpm  TEMP: ( )  RESP:  Physical Exam Vitals and nursing note reviewed.  Constitutional:      General: He is not in acute distress.    Appearance: Normal appearance. He is not ill-appearing.  HENT:     Head: Normocephalic and atraumatic.     Right Ear: External ear normal.     Left Ear: External ear normal.     Nose: No congestion.  Eyes:     Extraocular Movements: Extraocular movements intact.  Cardiovascular:     Rate and Rhythm: Normal rate.     Pulses: Normal pulses.  Pulmonary:     Effort: Pulmonary effort is normal. No respiratory distress.  Abdominal:     General: There is no distension.     Palpations: Abdomen is soft.  Musculoskeletal:        General: No tenderness or signs of injury.     Cervical back: Neck supple.     Right lower leg: No edema.     Left lower leg: No edema.     Comments: Patient has good distal strength without clonus.  Skin:    Findings: No erythema or rash.  Neurological:     General: No focal deficit present.     Mental Status: He is alert and oriented to person, place, and time.     Sensory: No sensory deficit.     Motor: No weakness or abnormal muscle tone.     Coordination: Coordination normal.  Psychiatric:        Mood and Affect: Mood normal.        Behavior: Behavior normal.      Imaging: No results found.

## 2021-01-21 NOTE — Procedures (Signed)
Lumbosacral Transforaminal Epidural Steroid Injection - Sub-Pedicular Approach with Fluoroscopic Guidance  [name]      Date of Birth: 05-Dec-1960 MRN: [id number] PCP: Excell Seltzer, MD      Visit Date: 01/07/2021   Universal Protocol:    Date/Time: 01/07/2021  Consent Given By: the patient  Position: PRONE  Additional Comments: Vital signs were monitored before and after the procedure. Patient was prepped and draped in the usual sterile fashion. The correct patient, procedure, and site was verified.   Injection Procedure Details:   Procedure diagnoses: Lumbar radiculopathy [M54.16]    Meds Administered:  Meds ordered this encounter  Medications  . methylPREDNISolone acetate (DEPO-MEDROL) injection 80 mg    Laterality: Left  Location/Site:  L4-L5  Needle:5.0 in., 22 ga.  Short bevel or Quincke spinal needle  Needle Placement: Transforaminal  Findings:    -Comments: Excellent flow of contrast along the nerve, nerve root and into the epidural space.  Procedure Details: After squaring off the end-plates to get a true AP view, the C-arm was positioned so that an oblique view of the foramen as noted above was visualized. The target area is just inferior to the "nose of the scotty dog" or sub pedicular. The soft tissues overlying this structure were infiltrated with 2-3 ml. of 1% Lidocaine without Epinephrine.  The spinal needle was inserted toward the target using a "trajectory" view along the fluoroscope beam.  Under AP and lateral visualization, the needle was advanced so it did not puncture dura and was located close the 6 O'Clock position of the pedical in AP tracterory. Biplanar projections were used to confirm position. Aspiration was confirmed to be negative for CSF and/or blood. A 1-2 ml. volume of Isovue-250 was injected and flow of contrast was noted at each level. Radiographs were obtained for documentation purposes.   After attaining the desired  flow of contrast documented above, a 0.5 to 1.0 ml test dose of 0.25% Marcaine was injected into each respective transforaminal space.  The patient was observed for 90 seconds post injection.  After no sensory deficits were reported, and normal lower extremity motor function was noted,   the above injectate was administered so that equal amounts of the injectate were placed at each foramen (level) into the transforaminal epidural space.   Additional Comments:  The patient tolerated the procedure well Dressing: 2 x 2 sterile gauze and Band-Aid    Post-procedure details: Patient was observed during the procedure. Post-procedure instructions were reviewed.  Patient left the clinic in stable condition.

## 2021-01-27 ENCOUNTER — Ambulatory Visit: Payer: Medicare Other | Admitting: Psychology

## 2021-01-27 ENCOUNTER — Other Ambulatory Visit: Payer: Self-pay | Admitting: Family Medicine

## 2021-02-09 ENCOUNTER — Telehealth: Payer: Self-pay

## 2021-02-09 ENCOUNTER — Other Ambulatory Visit: Payer: Medicare Other

## 2021-02-09 ENCOUNTER — Telehealth: Payer: Self-pay | Admitting: Family Medicine

## 2021-02-09 DIAGNOSIS — Z125 Encounter for screening for malignant neoplasm of prostate: Secondary | ICD-10-CM

## 2021-02-09 DIAGNOSIS — E785 Hyperlipidemia, unspecified: Secondary | ICD-10-CM

## 2021-02-09 NOTE — Telephone Encounter (Signed)
[facility] Night - Client Nonclinical Telephone Record AccessNurse Client [facility] Night - Client Client Site [facility] - Night Physician Kerby Nora - MD Contact Type Call Who Is Calling Patient / Member / Family / Caregiver Caller Name [name] Caller Phone Number [phone] Patient Name Adrian Cox Patient DOB 04-28-1961 Call Type Message Only Information Provided Reason for Call Request to [facility] Appointment Initial Comment Caller states he need to cancel his appointment tomorrow Additional Comment His appointment is at 830 April 26 Disp. Time Disposition Final User 02/08/2021 11:37:07 PM General Information Provided Yes Darryl Lent Call Closed By: Darryl Lent Transaction Date/Time: 02/08/2021 11:34:24 PM (ET)

## 2021-02-09 NOTE — Telephone Encounter (Signed)
-----   Message from Alvina Chou sent at 01/25/2021  2:24 PM EDT ----- Regarding: Lab orders for Tuesday, 4.26.22 Patient is scheduled for CPX labs, please order future labs, Thanks , Camelia Eng

## 2021-02-11 ENCOUNTER — Other Ambulatory Visit (INDEPENDENT_AMBULATORY_CARE_PROVIDER_SITE_OTHER): Payer: Medicare Other

## 2021-02-11 ENCOUNTER — Other Ambulatory Visit: Payer: Self-pay

## 2021-02-11 DIAGNOSIS — Z125 Encounter for screening for malignant neoplasm of prostate: Secondary | ICD-10-CM

## 2021-02-11 DIAGNOSIS — E785 Hyperlipidemia, unspecified: Secondary | ICD-10-CM | POA: Diagnosis not present

## 2021-02-11 LAB — COMPREHENSIVE METABOLIC PANEL
ALT: 27 U/L (ref 0–53)
AST: 30 U/L (ref 0–37)
Albumin: 4.3 g/dL (ref 3.5–5.2)
Alkaline Phosphatase: 71 U/L (ref 39–117)
BUN: 12 mg/dL (ref 6–23)
CO2: 32 mEq/L (ref 19–32)
Calcium: 9.5 mg/dL (ref 8.4–10.5)
Chloride: 104 mEq/L (ref 96–112)
Creatinine, Ser: 0.95 mg/dL (ref 0.40–1.50)
GFR: 87.11 mL/min (ref >60.00)
Glucose, Bld: 99 mg/dL (ref 70–99)
Potassium: 4.5 mEq/L (ref 3.5–5.1)
Sodium: 141 mEq/L (ref 135–145)
Total Bilirubin: 0.7 mg/dL (ref 0.2–1.2)
Total Protein: 7.5 g/dL (ref 6.0–8.3)

## 2021-02-11 LAB — LIPID PANEL
Cholesterol: 190 mg/dL (ref 0–200)
HDL: 38 mg/dL — ABNORMAL LOW (ref >39.00)
LDL Cholesterol: 137 mg/dL — ABNORMAL HIGH (ref 0–99)
NonHDL: 151.76
Total CHOL/HDL Ratio: 5
Triglycerides: 74 mg/dL (ref 0.0–149.0)
VLDL: 14.8 mg/dL (ref 0.0–40.0)

## 2021-02-11 LAB — PSA: PSA: 2.45 ng/mL (ref 0.10–4.00)

## 2021-02-11 NOTE — Progress Notes (Signed)
No critical labs need to be addressed urgently. We will discuss labs in detail at upcoming office visit.   

## 2021-02-16 ENCOUNTER — Other Ambulatory Visit: Payer: Self-pay

## 2021-02-16 ENCOUNTER — Encounter: Payer: Self-pay | Admitting: Family Medicine

## 2021-02-16 ENCOUNTER — Ambulatory Visit (INDEPENDENT_AMBULATORY_CARE_PROVIDER_SITE_OTHER): Payer: Medicare Other | Admitting: Family Medicine

## 2021-02-16 VITALS — BP 120/62 | HR 72 | Temp 97.5°F | Ht 71.0 in | Wt 210.8 lb

## 2021-02-16 DIAGNOSIS — Z0000 Encounter for general adult medical examination without abnormal findings: Secondary | ICD-10-CM

## 2021-02-16 DIAGNOSIS — G8929 Other chronic pain: Secondary | ICD-10-CM

## 2021-02-16 DIAGNOSIS — E785 Hyperlipidemia, unspecified: Secondary | ICD-10-CM

## 2021-02-16 DIAGNOSIS — I10 Essential (primary) hypertension: Secondary | ICD-10-CM

## 2021-02-16 DIAGNOSIS — F319 Bipolar disorder, unspecified: Secondary | ICD-10-CM

## 2021-02-16 DIAGNOSIS — G894 Chronic pain syndrome: Secondary | ICD-10-CM

## 2021-02-16 DIAGNOSIS — F1120 Opioid dependence, uncomplicated: Secondary | ICD-10-CM

## 2021-02-16 DIAGNOSIS — M5441 Lumbago with sciatica, right side: Secondary | ICD-10-CM | POA: Diagnosis not present

## 2021-02-16 MED ORDER — ATORVASTATIN CALCIUM 20 MG PO TABS: 20.0000 mg | ORAL_TABLET | Freq: Every day | ORAL | 11 refills | Status: AC

## 2021-02-16 NOTE — Assessment & Plan Note (Signed)
Associated Problem(s): Bipolar disorder (HCC)  Formatting of this note might be different from the original.   On no meds followed by counselor.  Electronically signed by Excell Seltzer, MD at 02/16/2021  3:02 PM EDT

## 2021-02-16 NOTE — Assessment & Plan Note (Signed)
Associated Problem(s): Narcotic dependence (HCC)  Formatting of this note might be different from the original.  Minimizing use.. using < 30 in last 3 months  Due for UDS and contract.  Follow up in 3 months if medication refill needed.   Try to wen down lyrica as well if able.  Electronically signed by Excell Seltzer, MD at 02/16/2021  3:03 PM EDT

## 2021-02-16 NOTE — Assessment & Plan Note (Signed)
Associated Problem(s): Essential hypertension  Formatting of this note might be different from the original.  Stable, chronic.  Continue current medication.    Lisinopril 20 mg daily.  Electronically signed by Excell Seltzer, MD at 02/16/2021  3:02 PM EDT

## 2021-02-16 NOTE — Assessment & Plan Note (Signed)
Minimizing use.. using < 30 in last 3 months Due for UDS and contract. Follow up in 3 months if medication refill needed.  Try to wen down lyrica as well if able.

## 2021-02-16 NOTE — Assessment & Plan Note (Signed)
Stable, chronic.  Continue current medication.  Lisinopril 20 mg daily. 

## 2021-02-16 NOTE — Assessment & Plan Note (Signed)
On no meds followed by counselor.

## 2021-02-16 NOTE — Patient Instructions (Signed)
Increase the Lipitor to 20 mg daily.  Work on low cholesterol diet.

## 2021-02-16 NOTE — Progress Notes (Signed)
Patient ID: Adrian Cox, male    DOB: January 26, 1961, 60 y.o.   MRN: [id number]  This visit was conducted in person.  Temp (!) 97.5 F (36.4 C) (Temporal)   Ht 5\' 11"  (1.803 m)   Wt 210 lb 12 oz (95.6 kg)   BMI 29.39 kg/m    CC:  Chief Complaint  Patient presents with  . Medicare Wellness    Subjective:   HPI: Adrian Cox is a 60 y.o. male presenting on 02/16/2021 for Medicare Wellness  The patient presents for annual medicare wellness, complete physical and review of chronic health problems. He/She also has the following acute concerns today: NONE   Documentation of this information was scanned into the electronic record under the media tab.`11 Cognitive evaluation was performed and recorded on pt medicare questionnaire form. The patients weight, height, BMI and visual acuity have been recorded in the chart  I have made referrals, counseling and provided education to the patient based review of the above and I have provided the pt with a written personalized care plan for preventive services.   Documentation of this information was scanned into the electronic record under the media tab.  Advance directives and end of life planning reviewed in detail with patient and documented in EMR. Patient given handout on advance care directives if needed. HCPOA and living will updated if needed.  Fall Risk  02/16/2021 01/21/2020 11/19/2018 11/14/2017 01/03/2017  Falls in the past year? 0 0 1 No No  Comment - - multiple falls due to sciatic pain and weakness of legs - -  Number falls in past yr: - 0 1 - -  Injury with Fall? - 0 0 - -  Risk for fall due to : - Medication side effect - - -  Follow up - Falls evaluation completed;Falls prevention discussed - - -    Flowsheet Row Office Visit from 02/16/2021 in [facility] at [facility] Total Score 0     Hypertension:  Good control on lisinopril 20 mg daily BP Readings from Last 3 Encounters:  02/16/21 120/62  01/07/21 (!)  168/81  12/26/20 (!) 160/86  Using medication without problems or lightheadedness:  none Chest pain with exertion: none Edema:none Short of breath: none Average home BPs: Other issues:  Elevated Cholesterol:LDL not at goal... on atorvastatin 10 mg daily Lab Results  Component Value Date   CHOL 190 02/11/2021   HDL 38.00 (L) 02/11/2021   LDLCALC 137 (H) 02/11/2021   LDLDIRECT 98.0 08/17/2016   TRIG 74.0 02/11/2021   CHOLHDL 5 02/11/2021  Using medications without problems: none Muscle aches: none Diet compliance: Good.. Exercise: Other complaints: The 10-year ASCVD risk score 02/13/2021 DC Denman George., et al., 2013) is: 22.9%   Values used to calculate the score:     Age: 41 years     Sex: Male     Is Non-Hispanic African American: Yes     Diabetic: No     Tobacco smoker: No     Systolic Blood Pressure: 168 mmHg     Is BP treated: Yes     HDL Cholesterol: 38 mg/dL     Total Cholesterol: 190 mg/dL  Indication for chronic opioid: chronic back pain Medication and dose: lyrica 150 mg TID #90 per month Oxycodone 5 mgdaily prn pain # pills per month:  < 30 in last 3 months.... does not need refills today. Last refill 11/2020 Last UDS date: due Opioid Treatment Agreement signed (Y/N): yes  Opioid Treatment Agreement last reviewed with patient:   02/16/21 NCCSRS reviewed this encounter (include red flags):   PDMP reviewed during this encounter.      Relevant past medical, surgical, family and social history reviewed and updated as indicated. Interim medical history since our last visit reviewed. Allergies and medications reviewed and updated. Outpatient Medications Prior to Visit  Medication Sig Dispense Refill  . atorvastatin (LIPITOR) 10 MG tablet TAKE 1 TABLET BY MOUTH EVERY DAY 90 tablet 0  . Cholecalciferol (VITAMIN D) 50 MCG (2000 UT) tablet Take 2,000 Units by mouth daily.    Marland Kitchen docusate sodium (COLACE) 100 MG capsule Take 1 capsule (100 mg total) by mouth every 12 (twelve) hours.  60 capsule 2  . FIBER PO Take 2 tablets by mouth 3 (three) times daily. gummies    . lisinopril (ZESTRIL) 10 MG tablet TAKE 2 TABLETS BY MOUTH EVERY DAY 180 tablet 1  . Multiple Vitamins-Minerals (MULTIVITAMIN ADULT PO) Take 2 tablets by mouth daily. gummies    . oxycodone (OXY-IR) 5 MG capsule Take 1 capsule (5 mg total) by mouth daily as needed for pain. Do not fill until 11/23/2020 30 capsule 0  . pregabalin (LYRICA) 150 MG capsule TAKE 1 CAPSULE BY MOUTH THREE TIMES A DAY 90 capsule 0  . vardenafil (LEVITRA) 20 MG tablet TAKE 1 TABLET BY MOUTH ONCE DAILY AS NEEDED FOR ERECTILE DYSFUNCTION 10 tablet 2  . glycerin adult 2 g suppository Place 1 suppository rectally as needed for constipation. 12 suppository 2  . polyethylene glycol (MIRALAX / GLYCOLAX) 17 g packet Take 17 g by mouth daily. 14 each 3   No facility-administered medications prior to visit.     Per HPI unless specifically indicated in ROS section below Review of Systems  Constitutional: Negative for fatigue and fever.  HENT: Negative for ear pain.   Eyes: Negative for pain.  Respiratory: Negative for cough and shortness of breath.   Cardiovascular: Negative for chest pain, palpitations and leg swelling.  Gastrointestinal: Negative for abdominal pain.  Genitourinary: Negative for dysuria.  Musculoskeletal: Negative for arthralgias.  Neurological: Negative for syncope, light-headedness and headaches.  Psychiatric/Behavioral: Negative for dysphoric mood.   Objective:  Temp (!) 97.5 F (36.4 C) (Temporal)   Ht 5\' 11"  (1.803 m)   Wt 210 lb 12 oz (95.6 kg)   BMI 29.39 kg/m   Wt Readings from Last 3 Encounters:  02/16/21 210 lb 12 oz (95.6 kg)  09/22/20 211 lb 12 oz (96 kg)  08/01/20 209 lb (94.8 kg)      Physical Exam Constitutional:      General: He is not in acute distress.    Appearance: Normal appearance. He is well-developed. He is not ill-appearing or toxic-appearing.  HENT:     Head: Normocephalic and  atraumatic.     Right Ear: Hearing, tympanic membrane, ear canal and external ear normal.     Left Ear: Hearing, tympanic membrane, ear canal and external ear normal.     Nose: Nose normal.     Mouth/Throat:     Pharynx: Uvula midline.  Eyes:     General: Lids are normal. Lids are everted, no foreign bodies appreciated.     Conjunctiva/sclera: Conjunctivae normal.     Pupils: Pupils are equal, round, and reactive to light.  Neck:     Thyroid: No thyroid mass or thyromegaly.     Vascular: No carotid bruit.     Trachea: Trachea and phonation normal.  Cardiovascular:  Rate and Rhythm: Normal rate and regular rhythm.     Pulses: Normal pulses.     Heart sounds: S1 normal and S2 normal. No murmur heard. No gallop.   Pulmonary:     Breath sounds: Normal breath sounds. No wheezing, rhonchi or rales.  Abdominal:     General: Bowel sounds are normal.     Palpations: Abdomen is soft.     Tenderness: There is no abdominal tenderness. There is no guarding or rebound.     Hernia: No hernia is present.  Musculoskeletal:     Cervical back: Normal range of motion and neck supple.  Lymphadenopathy:     Cervical: No cervical adenopathy.  Skin:    General: Skin is warm and dry.     Findings: No rash.  Neurological:     Mental Status: He is alert.     Cranial Nerves: No cranial nerve deficit.     Sensory: No sensory deficit.     Gait: Gait normal.     Deep Tendon Reflexes: Reflexes are normal and symmetric.  Psychiatric:        Speech: Speech normal.        Behavior: Behavior normal.        Judgment: Judgment normal.       Results for orders placed or performed in visit on 02/11/21  PSA  Result Value Ref Range   PSA 2.45 0.10 - 4.00 ng/mL  Comprehensive metabolic panel  Result Value Ref Range   Sodium 141 135 - 145 mEq/L   Potassium 4.5 3.5 - 5.1 mEq/L   Chloride 104 96 - 112 mEq/L   CO2 32 19 - 32 mEq/L   Glucose, Bld 99 70 - 99 mg/dL   BUN 12 6 - 23 mg/dL   Creatinine, Ser  1.74 0.40 - 1.50 mg/dL   Total Bilirubin 0.7 0.2 - 1.2 mg/dL   Alkaline Phosphatase 71 39 - 117 U/L   AST 30 0 - 37 U/L   ALT 27 0 - 53 U/L   Total Protein 7.5 6.0 - 8.3 g/dL   Albumin 4.3 3.5 - 5.2 g/dL   GFR 08.14 >48.18 mL/min   Calcium 9.5 8.4 - 10.5 mg/dL  Lipid panel  Result Value Ref Range   Cholesterol 190 0 - 200 mg/dL   Triglycerides 56.3 0.0 - 149.0 mg/dL   HDL 14.97 (L) >02.63 mg/dL   VLDL 78.5 0.0 - 88.5 mg/dL   LDL Cholesterol 027 (H) 0 - 99 mg/dL   Total CHOL/HDL Ratio 5    NonHDL 151.76     This visit occurred during the SARS-CoV-2 public health emergency.  Safety protocols were in place, including screening questions prior to the visit, additional usage of staff PPE, and extensive cleaning of exam room while observing appropriate contact time as indicated for disinfecting solutions.   COVID 19 screen:  No recent travel or known exposure to COVID19 The patient denies respiratory symptoms of COVID 19 at this time. The importance of social distancing was discussed today.   Assessment and Plan   The patient's preventative maintenance and recommended screening tests for an annual wellness exam were reviewed in full today. Brought up to date unless services declined.  Counselled on the importance of diet, exercise, and its role in overall health and mortality. The patient's FH and SH was reviewed, including their home life, tobacco status, and drug and alcohol status.              Colon Cancer  Screen:07/2017, repeat in 5 years  Vaccinesuptodate with flu, TDap and COVID19. Prostate:  Lab Results  Component Value Date   PSA 2.45 02/11/2021   PSA 2.10 01/31/2020   PSA 2.21 11/19/2018      Smoking Status:none ETOH/ drug use:No ETOH/ Has given up marijuana Hep C:done HIV screen:done Colon: 2018.. plan repeat in 2023   Problem List Items Addressed This Visit    Bipolar disorder (HCC)     On no meds followed by counselor.      Chronic low  back pain with sciatica   Chronic pain syndrome   Relevant Orders   DRUG MONITORING, PANEL 8 WITH CONFIRMATION, URINE   Essential hypertension    Stable, chronic.  Continue current medication.   Lisinopril 20 mg daily.      Relevant Medications   atorvastatin (LIPITOR) 20 MG tablet   Hyperlipidemia   Relevant Medications   atorvastatin (LIPITOR) 20 MG tablet   Narcotic dependence (HCC)    Minimizing use.. using < 30 in last 3 months Due for UDS and contract. Follow up in 3 months if medication refill needed.  Try to wen down lyrica as well if able.       Other Visit Diagnoses    Medicare annual wellness visit, subsequent    -  Primary       Kerby NoraAmy Vernel Langenderfer, MD

## 2021-02-16 NOTE — Progress Notes (Signed)
Formatting of this note is different from the original.  Images from the original note were not included.       Patient ID: Mitchell Evans, male    DOB: [birth year], 60 y.o.   MRN: [id number]    This visit was conducted in person.    Temp (!) 97.5 F (36.4 C) (Temporal)   Ht 5' 11 (1.803 m)   Wt 210 lb 12 oz (95.6 kg)   BMI 29.39 kg/m      CC:   Chief Complaint   Patient presents with   ? Medicare Wellness     Subjective:     HPI:  Mitchell Evans is a 60 y.o. male presenting on 02/16/2021 for Medicare Wellness    The patient presents for annual medicare wellness, complete physical and review of chronic health problems. He/She also has the following acute concerns today: NONE     Documentation of this information was scanned into the electronic record under the media tab.`11  Cognitive evaluation was performed and recorded on pt medicare questionnaire form.  The patients weight, height, BMI and visual acuity have been recorded in the chart   I have made referrals, counseling and provided education to the patient based review of the above and I have provided the pt with a written personalized care plan for preventive services.    Documentation of this information was scanned into the electronic record under the media tab.   Advance directives and end of life planning reviewed in detail with patient and documented in EMR. Patient given handout on advance care directives if needed. HCPOA and living will updated if needed.    Fall Risk  02/16/2021 01/21/2020 11/19/2018 11/14/2017 01/03/2017   Falls in the past year? 0 0 1 No No   Comment - - multiple falls due to sciatic pain and weakness of legs - -   Number falls in past yr: - 0 1 - -   Injury with Fall? - 0 0 - -   Risk for fall due to : - Medication side effect - - -   Follow up - Falls evaluation completed;Falls prevention discussed - - -     Flowsheet Row Office Visit from 02/16/2021 in [facility] at [facility] Total Score 0       Hypertension:  Good  control on lisinopril 20 mg daily  BP Readings from Last 3 Encounters:   02/16/21 120/62   01/07/21 (!) 168/81   12/26/20 (!) 160/86   Using medication without problems or lightheadedness:  none  Chest pain with exertion: none  Edema:none  Short of breath: none  Average home BPs:  Other issues:    Elevated Cholesterol:LDL not at goal... on atorvastatin 10 mg daily  Lab Results   Component Value Date    CHOL 190 02/11/2021    HDL 38.00 (L) 02/11/2021    LDLCALC 137 (H) 02/11/2021    LDLDIRECT 98.0 08/17/2016    TRIG 74.0 02/11/2021    CHOLHDL 5 02/11/2021   Using medications without problems: none  Muscle aches: none  Diet compliance: Good..  Exercise:  Other complaints:  The 10-year ASCVD risk score Denman George DC Montez Hageman., et al., 2013) is: 22.9%    Values used to calculate the score:      Age: 79 years      Sex: Male      Is Non-Hispanic African American: Yes      Diabetic: No  Tobacco smoker: No      Systolic Blood Pressure: 168 mmHg      Is BP treated: Yes      HDL Cholesterol: 38 mg/dL      Total Cholesterol: 190 mg/dL    Indication for chronic opioid: chronic back pain  Medication and dose: lyrica 150 mg TID #90 per month  Oxycodone 5 mgdaily prn pain  # pills per month:  < 30 in last 3 months.... does not need refills today. Last refill 11/2020  Last UDS date: due  Opioid Treatment Agreement signed (Y/N): yes  Opioid Treatment Agreement last reviewed with patient:   02/16/21  NCCSRS reviewed this encounter (include red flags):   PDMP reviewed during this encounter.        Relevant past medical, surgical, family and social history reviewed and updated as indicated. Interim medical history since our last visit reviewed.  Allergies and medications reviewed and updated.  Outpatient Medications Prior to Visit   Medication Sig Dispense Refill   ? atorvastatin (LIPITOR) 10 MG tablet TAKE 1 TABLET BY MOUTH EVERY DAY 90 tablet 0   ? Cholecalciferol (VITAMIN D) 50 MCG (2000 UT) tablet Take 2,000 Units by mouth daily.     ?  docusate sodium (COLACE) 100 MG capsule Take 1 capsule (100 mg total) by mouth every 12 (twelve) hours. 60 capsule 2   ? FIBER PO Take 2 tablets by mouth 3 (three) times daily. gummies     ? lisinopril (ZESTRIL) 10 MG tablet TAKE 2 TABLETS BY MOUTH EVERY DAY 180 tablet 1   ? Multiple Vitamins-Minerals (MULTIVITAMIN ADULT PO) Take 2 tablets by mouth daily. gummies     ? oxycodone (OXY-IR) 5 MG capsule Take 1 capsule (5 mg total) by mouth daily as needed for pain. Do not fill until 11/23/2020 30 capsule 0   ? pregabalin (LYRICA) 150 MG capsule TAKE 1 CAPSULE BY MOUTH THREE TIMES A DAY 90 capsule 0   ? vardenafil (LEVITRA) 20 MG tablet TAKE 1 TABLET BY MOUTH ONCE DAILY AS NEEDED FOR ERECTILE DYSFUNCTION 10 tablet 2   ? glycerin adult 2 g suppository Place 1 suppository rectally as needed for constipation. 12 suppository 2   ? polyethylene glycol (MIRALAX / GLYCOLAX) 17 g packet Take 17 g by mouth daily. 14 each 3     No facility-administered medications prior to visit.       Per HPI unless specifically indicated in ROS section below  Review of Systems   Constitutional: Negative for fatigue and fever.   HENT: Negative for ear pain.    Eyes: Negative for pain.   Respiratory: Negative for cough and shortness of breath.    Cardiovascular: Negative for chest pain, palpitations and leg swelling.   Gastrointestinal: Negative for abdominal pain.   Genitourinary: Negative for dysuria.   Musculoskeletal: Negative for arthralgias.   Neurological: Negative for syncope, light-headedness and headaches.   Psychiatric/Behavioral: Negative for dysphoric mood.     Objective:   Temp (!) 97.5 F (36.4 C) (Temporal)   Ht 5' 11 (1.803 m)   Wt 210 lb 12 oz (95.6 kg)   BMI 29.39 kg/m    Wt Readings from Last 3 Encounters:   02/16/21 210 lb 12 oz (95.6 kg)   09/22/20 211 lb 12 oz (96 kg)   08/01/20 209 lb (94.8 kg)       Physical Exam  Constitutional:       General: He is not in acute distress.  Appearance: Normal appearance. He is  well-developed. He is not ill-appearing or toxic-appearing.   HENT:      Head: Normocephalic and atraumatic.      Right Ear: Hearing, tympanic membrane, ear canal and external ear normal.      Left Ear: Hearing, tympanic membrane, ear canal and external ear normal.      Nose: Nose normal.      Mouth/Throat:      Pharynx: Uvula midline.   Eyes:      General: Lids are normal. Lids are everted, no foreign bodies appreciated.      Conjunctiva/sclera: Conjunctivae normal.      Pupils: Pupils are equal, round, and reactive to light.   Neck:      Thyroid: No thyroid mass or thyromegaly.      Vascular: No carotid bruit.      Trachea: Trachea and phonation normal.   Cardiovascular:      Rate and Rhythm: Normal rate and regular rhythm.      Pulses: Normal pulses.      Heart sounds: S1 normal and S2 normal. No murmur heard.  No gallop.    Pulmonary:      Breath sounds: Normal breath sounds. No wheezing, rhonchi or rales.   Abdominal:      General: Bowel sounds are normal.      Palpations: Abdomen is soft.      Tenderness: There is no abdominal tenderness. There is no guarding or rebound.      Hernia: No hernia is present.   Musculoskeletal:      Cervical back: Normal range of motion and neck supple.   Lymphadenopathy:      Cervical: No cervical adenopathy.   Skin:     General: Skin is warm and dry.      Findings: No rash.   Neurological:      Mental Status: He is alert.      Cranial Nerves: No cranial nerve deficit.      Sensory: No sensory deficit.      Gait: Gait normal.      Deep Tendon Reflexes: Reflexes are normal and symmetric.   Psychiatric:         Speech: Speech normal.         Behavior: Behavior normal.         Judgment: Judgment normal.       Results for orders placed or performed in visit on 02/11/21   PSA   Result Value Ref Range    PSA 2.45 0.10 - 4.00 ng/mL   Comprehensive metabolic panel   Result Value Ref Range    Sodium 141 135 - 145 mEq/L    Potassium 4.5 3.5 - 5.1 mEq/L    Chloride 104 96 - 112 mEq/L    CO2  32 19 - 32 mEq/L    Glucose, Bld 99 70 - 99 mg/dL    BUN 12 6 - 23 mg/dL    Creatinine, Ser 7.42 0.40 - 1.50 mg/dL    Total Bilirubin 0.7 0.2 - 1.2 mg/dL    Alkaline Phosphatase 71 39 - 117 U/L    AST 30 0 - 37 U/L    ALT 27 0 - 53 U/L    Total Protein 7.5 6.0 - 8.3 g/dL    Albumin 4.3 3.5 - 5.2 g/dL    GFR 59.56 >38.75 mL/min    Calcium 9.5 8.4 - 10.5 mg/dL   Lipid panel   Result Value Ref Range  Cholesterol 190 0 - 200 mg/dL    Triglycerides 16.1 0.0 - 149.0 mg/dL    HDL 09.60 (L) >45.40 mg/dL    VLDL 98.1 0.0 - 19.1 mg/dL    LDL Cholesterol 478 (H) 0 - 99 mg/dL    Total CHOL/HDL Ratio 5     NonHDL 151.76      This visit occurred during the SARS-CoV-2 public health emergency.  Safety protocols were in place, including screening questions prior to the visit, additional usage of staff PPE, and extensive cleaning of exam room while observing appropriate contact time as indicated for disinfecting solutions.     COVID 19 screen:  No recent travel or known exposure to COVID19  The patient denies respiratory symptoms of COVID 19 at this time.  The importance of social distancing was discussed today.     Assessment and Plan     The patient's preventative maintenance and recommended screening tests for an annual wellness exam were reviewed in full today.  Brought up to date unless services declined.    Counselled on the importance of diet, exercise, and its role in overall health and mortality.  The patient's FH and SH was reviewed, including their home life, tobacco status, and drug and alcohol status.             Colon Cancer Screen:07/2017, repeat in 5 years   Vaccinesuptodate with flu, TDap and COVID19.  Prostate:   Lab Results   Component Value Date    PSA 2.45 02/11/2021    PSA 2.10 01/31/2020    PSA 2.21 11/19/2018       Smoking Status:none  ETOH/ drug use:No ETOH/ Has given up marijuana  Hep C:done  HIV screen:done  Colon: 2018.. plan repeat in 2023    Problem List Items Addressed This Visit     Bipolar  disorder (HCC)      On no meds followed by counselor.       Chronic low back pain with sciatica    Chronic pain syndrome    Relevant Orders    DRUG MONITORING, PANEL 8 WITH CONFIRMATION, URINE    Essential hypertension     Stable, chronic.  Continue current medication.    Lisinopril 20 mg daily.       Relevant Medications    atorvastatin (LIPITOR) 20 MG tablet    Hyperlipidemia    Relevant Medications    atorvastatin (LIPITOR) 20 MG tablet    Narcotic dependence (HCC)     Minimizing use.. using < 30 in last 3 months  Due for UDS and contract.  Follow up in 3 months if medication refill needed.   Try to wen down lyrica as well if able.        Other Visit Diagnoses     Medicare annual wellness visit, subsequent    -  Primary       Kerby Nora, MD     Electronically signed by Excell Seltzer, MD at 02/16/2021  3:04 PM EDT

## 2021-02-18 LAB — DRUG MONITORING, PANEL 8 WITH CONFIRMATION, URINE
6 Acetylmorphine: NEGATIVE ng/mL (ref <10)
Alcohol Metabolites: NEGATIVE ng/mL
Amphetamines: NEGATIVE ng/mL (ref <500)
Benzodiazepines: NEGATIVE ng/mL (ref <100)
Buprenorphine, Urine: NEGATIVE ng/mL (ref <5)
Cocaine Metabolite: NEGATIVE ng/mL (ref <150)
Creatinine: 182.3 mg/dL
MDMA: NEGATIVE ng/mL (ref <500)
Marijuana Metabolite: 2175 ng/mL — ABNORMAL HIGH (ref <5)
Marijuana Metabolite: POSITIVE ng/mL — AB (ref <20)
Opiates: NEGATIVE ng/mL (ref <100)
Oxidant: NEGATIVE ug/mL
Oxycodone: NEGATIVE ng/mL (ref <100)
pH: 6.5 (ref 4.5–9.0)

## 2021-02-18 LAB — DM TEMPLATE

## 2021-02-21 ENCOUNTER — Emergency Department (HOSPITAL_COMMUNITY)
Admission: EM | Admit: 2021-02-21 | Discharge: 2021-02-21 | Disposition: A | Discharge status: Home / Self Care | Payer: Medicare Other | Attending: Emergency Medicine | Admitting: Emergency Medicine

## 2021-02-21 ENCOUNTER — Encounter (HOSPITAL_COMMUNITY): Payer: Self-pay | Admitting: Emergency Medicine

## 2021-02-21 ENCOUNTER — Other Ambulatory Visit: Payer: Self-pay

## 2021-02-21 DIAGNOSIS — M5442 Lumbago with sciatica, left side: Secondary | ICD-10-CM | POA: Insufficient documentation

## 2021-02-21 DIAGNOSIS — M545 Low back pain, unspecified: Secondary | ICD-10-CM | POA: Diagnosis present

## 2021-02-21 DIAGNOSIS — Z79899 Other long term (current) drug therapy: Secondary | ICD-10-CM | POA: Insufficient documentation

## 2021-02-21 DIAGNOSIS — G8929 Other chronic pain: Secondary | ICD-10-CM | POA: Diagnosis not present

## 2021-02-21 DIAGNOSIS — M5432 Sciatica, left side: Secondary | ICD-10-CM

## 2021-02-21 DIAGNOSIS — I10 Essential (primary) hypertension: Secondary | ICD-10-CM | POA: Diagnosis not present

## 2021-02-21 MED ORDER — DEXAMETHASONE SODIUM PHOSPHATE 10 MG/ML IJ SOLN
8.0000 mg | Freq: Once | INTRAMUSCULAR | Status: AC
  Administered 2021-02-21: 8 mg via INTRAMUSCULAR
  Filled 2021-02-21: qty 1

## 2021-02-21 MED ORDER — HYDROMORPHONE HCL 1 MG/ML IJ SOLN
1.0000 mg | Freq: Once | INTRAMUSCULAR | Status: AC
  Administered 2021-02-21: 1 mg via INTRAMUSCULAR
  Filled 2021-02-21: qty 1

## 2021-02-21 NOTE — ED Triage Notes (Signed)
C/o lower back pain that radiates down L leg since yesterday.  History of sciatica.  No known injury.

## 2021-02-21 NOTE — Discharge Instructions (Addendum)
  You were evaluated in the Emergency Department and after careful evaluation, we did not find any emergent condition requiring admission or further testing in the hospital.   Your exam/testing today was overall reassuring.  Symptoms seem to be due to sciatica, please follow-up with Dr. Alvester Morin.  You can also use the attached instructions on sciatica rehab for sports med. Please return to the Emergency Department if you experience any worsening of your condition.  Thank you for allowing [place] to be a part of your care. Please speak to your pharmacist about any new medications prescribed today in regards to side effects or interactions with other medications.     Your blood pressure was slightly elevated to the ER, please have this followed up with your PCP.

## 2021-02-21 NOTE — ED Provider Notes (Signed)
[facility] EMERGENCY DEPARTMENT Provider Note   CSN: [id number] Arrival date & time: 02/21/21  0756     History Chief Complaint  Patient presents with  . Back Pain    Adrian Cox is a 60 y.o. male with pertinent past medical history of depression, diverticulosis, hyperlipidemia, narcotic dependence, spinal stenosis, hypertension and sciatica that presents to the emerge department today for back pain and leg pain.  Pain started last night.  Patient states that the pain radiates from his left hip into his left knee, also admits to some tingling.  Denies any numbness or weakness.  Patient states this is typical pain that he gets in regards to his spinal stenosis and chronic back pain with sciatica, is normally controlled with home medications and epidural injections every 6 months.  States that his last injection was 2 months.  Patient follows Dr. Alvester Morin for this, states that they are planning on doing a nerve stimulator at the next appointment.  Patient denies any acute trauma to the area.  Denies any IV drug use, fevers, saddle paresthesias, urinary or bowel incontinence, urinary retention, dysuria.  Denies any abdominal pain nausea or vomiting.  Denies any chest pain or shortness of breath.   HPI     Past Medical History:  Diagnosis Date  . Depression    bipolar  . Diverticulosis   . Hyperlipidemia   . Hypertension   . Mold exposure   . Sciatic nerve pain   . Spinal stenosis     Patient Active Problem List   Diagnosis Date Noted  . Narcotic dependence (HCC) 09/22/2020  . Spinal stenosis of lumbar region with neurogenic claudication 06/03/2020  . Acute pain of left shoulder 05/07/2020  . Microscopic hematuria 02/07/2020  . Erectile dysfunction 01/31/2020  . Cervical spondylosis without myelopathy 10/31/2019  . Post laminectomy syndrome 10/31/2019  . Constipation due to opioid therapy 09/20/2019  . Excessive sweating 09/20/2019  . Left leg pain  02/28/2019  . Elevated liver function tests 11/20/2018  . Chronic pain syndrome 11/20/2018  . Cervical radiculopathy 09/05/2018  . Lumbar radiculopathy 08/17/2018  . Pain in right thigh 03/27/2018  . Lateral knee pain, right 03/27/2018  . 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 Chronic low back pain with sciatica 11/09/2017  . Osteoarthritis of spine 02/23/2017  . Chronic right shoulder pain 01/09/2017  . Essential hypertension 08/17/2016  . Bipolar disorder (HCC) 08/17/2016  . Hyperlipidemia 08/17/2016    Past Surgical History:  Procedure Laterality Date  . BACK SURGERY    . CERVICAL SPINE SURGERY  2003   fusion  . KNEE ARTHROSCOPY Right   . KNEE ARTHROSCOPY Left   . SPINE SURGERY         Family History  Problem Relation Age of Onset  . Colon cancer Mother 51  . Hypertension Mother   . Alzheimer's disease Father   . Hypertension Father   . Hypertension Sister   . Diabetes Sister   . Diabetes Maternal Grandmother   . Hypertension Maternal Grandmother   . Hypertension Sister   . Diabetes Sister     Social History   Tobacco Use  . Smoking status: Never Smoker  . Smokeless tobacco: Never Used  Vaping Use  . Vaping Use: Never used  Substance Use Topics  . Alcohol use: No  . Drug use: Yes    Types: Marijuana    Comment: Hx marijuana use - none x 2 yrs    Home Medications Prior to  Admission medications   Medication Sig Start Date End Date Taking? Authorizing Provider  atorvastatin (LIPITOR) 20 MG tablet Take 1 tablet (20 mg total) by mouth daily. 02/16/21   Bedsole, Amy E, MD  Cholecalciferol (VITAMIN D) 50 MCG (2000 UT) tablet Take 2,000 Units by mouth daily.    [provider]  docusate sodium (COLACE) 100 MG capsule Take 1 capsule (100 mg total) by mouth every 12 (twelve) hours. 08/01/20   Arby Barrette, MD  FIBER PO Take 2 tablets by mouth 3 (three) times daily. gummies    [provider]  lisinopril (ZESTRIL) 10 MG tablet TAKE 2 TABLETS  BY MOUTH EVERY DAY 12/09/20   Bedsole, Amy E, MD  Multiple Vitamins-Minerals (MULTIVITAMIN ADULT PO) Take 2 tablets by mouth daily. gummies    [provider]  oxycodone (OXY-IR) 5 MG capsule Take 1 capsule (5 mg total) by mouth daily as needed for pain. Do not fill until 11/23/2020 11/24/20   Excell Seltzer, MD  pregabalin (LYRICA) 150 MG capsule TAKE 1 CAPSULE BY MOUTH THREE TIMES A DAY 01/08/21   Bedsole, Amy E, MD  vardenafil (LEVITRA) 20 MG tablet TAKE 1 TABLET BY MOUTH ONCE DAILY AS NEEDED FOR ERECTILE DYSFUNCTION 10/07/20   Excell Seltzer, MD    Allergies    Patient has no known allergies.  Review of Systems   Review of Systems  Constitutional: Negative for diaphoresis, fatigue and fever.  Eyes: Negative for visual disturbance.  Respiratory: Negative for shortness of breath.   Cardiovascular: Negative for chest pain.  Gastrointestinal: Negative for nausea and vomiting.  Musculoskeletal: Positive for arthralgias and back pain. Negative for myalgias.  Skin: Negative for color change, pallor, rash and wound.  Neurological: Negative for syncope, weakness, light-headedness, numbness and headaches.  Psychiatric/Behavioral: Negative for behavioral problems and confusion.    Physical Exam Updated Vital Signs BP (!) 151/74 (BP Location: Right Arm)   Pulse 62   Temp 97.7 F (36.5 C) (Oral)   Resp 14   SpO2 97%   Physical Exam Constitutional:      General: He is not in acute distress.    Appearance: Normal appearance. He is not ill-appearing, toxic-appearing or diaphoretic.  Cardiovascular:     Rate and Rhythm: Normal rate and regular rhythm.     Pulses: Normal pulses.  Pulmonary:     Effort: Pulmonary effort is normal.     Breath sounds: Normal breath sounds.  Musculoskeletal:        General: Normal range of motion.       Back:     Comments: Patient with tenderness in this area.  Does not cross midline.  Positive straight leg raise.  No objective numbness to back or  bilateral upper or lower part of leg bilaterally.  Normal strength to hip, knee and foot.  Normal gait, however slightly antalgic favoring right side.  PT pulses bilaterally 2+.  Skin:    General: Skin is warm and dry.     Capillary Refill: Capillary refill takes less than 2 seconds.  Neurological:     General: No focal deficit present.     Mental Status: He is alert and oriented to person, place, and time.  Psychiatric:        Mood and Affect: Mood normal.        Behavior: Behavior normal.        Thought Content: Thought content normal.     ED Results / Procedures / Treatments   Labs (all  labs ordered are listed, but only abnormal results are displayed) Labs Reviewed - No data to display  EKG None  Radiology No results found.  Procedures Procedures   Medications Ordered in ED Medications  dexamethasone (DECADRON) injection 8 mg (has no administration in time range)  HYDROmorphone (DILAUDID) injection 1 mg (has no administration in time range)    ED Course  I have reviewed the triage vital signs and the nursing notes.  Pertinent labs & imaging results that were available during my care of the patient were reviewed by me and considered in my medical decision making (see chart for details).    MDM Rules/Calculators/A&P                          Patient presents to the emergency department today for his chronic back pain, states that it feels exactly the same.  No red flag symptoms, patient is alert and nontoxic.  Patient presents with a flare that started last night, states that he has been treated in the ED before with Decadron and Dilaudid, states that this seems to help the flare significantly.  We will treat again for that today, patient does have appointment coming up with Dr. Alvester Morin for his neurostimulator in regards to his sciatica.  No concerns for cauda equina, epidural abscess, cord disease at this time.  Patient is distally neurovascularly intact, ambulatory.   Symptomatic treatment discussed in regards to constant sciatica, patient to be discharged at this time.  Upon reevaluation, patient states that pain is 0 out of 10, has completely resolved.  Doubt need for further emergent work up at this time. I explained the diagnosis and have given explicit precautions to return to the ER including for any other new or worsening symptoms. The patient understands and accepts the medical plan as it's been dictated and I have answered their questions. Discharge instructions concerning home care and prescriptions have been given. The patient is STABLE and is discharged to home in good condition.    Final Clinical Impression(s) / ED Diagnoses Final diagnoses:  Sciatica of left side    Rx / DC Orders ED Discharge Orders    None       Farrel Gordon, PA-C 02/21/21 5830    Arby Barrette, MD 02/21/21 [phone]

## 2021-03-10 ENCOUNTER — Other Ambulatory Visit: Payer: Self-pay

## 2021-03-10 MED ORDER — PREGABALIN 150 MG PO CAPS
150.0000 mg | ORAL_CAPSULE | Freq: Three times a day (TID) | ORAL | 0 refills | Status: DC
Start: 2021-03-10 — End: 2021-05-04

## 2021-03-10 NOTE — Telephone Encounter (Signed)
Last OV - 02/16/2021 Next OV - N/A Last Refilled - 01/08/21

## 2021-03-11 ENCOUNTER — Other Ambulatory Visit: Payer: Self-pay

## 2021-03-11 MED ORDER — OXYCODONE HCL 5 MG PO CAPS
5.0000 mg | ORAL_CAPSULE | Freq: Every day | ORAL | 0 refills | Status: AC | PRN
Start: 2021-03-11 — End: ?

## 2021-03-11 NOTE — Telephone Encounter (Signed)
Last office visit 02/16/2021 for Medicare Wellness.  Last refilled 11/24/2020 for #30 with no refills.  UDS/Contract 02/16/2021.  No future appointments.

## 2021-05-02 ENCOUNTER — Other Ambulatory Visit: Payer: Self-pay | Admitting: Family Medicine

## 2021-05-02 NOTE — Unmapped (Signed)
Formatting of this note might be different from the original.  Last office visit 02/16/21 for MWV.  Last refilled 03/10/21 for #90 with no refills  No future appointments.  Electronically signed by Damita Lack, CMA at 05/02/2021  5:50 PM EDT

## 2021-05-02 NOTE — Telephone Encounter (Signed)
Last office visit 02/16/21 for MWV.  Last refilled 03/10/21 for #90 with no refills  No future appointments.

## 2021-05-10 ENCOUNTER — Other Ambulatory Visit: Payer: Self-pay | Admitting: Family Medicine

## 2021-05-14 ENCOUNTER — Other Ambulatory Visit: Payer: Self-pay

## 2021-05-14 NOTE — Telephone Encounter (Signed)
Appointment scheduled 05/28/2021 at 12:00 pm.  Deny refill until appointment.

## 2021-05-14 NOTE — Telephone Encounter (Signed)
Last office visit 02/16/2021 for MWV.  Last refilled 03/11/2021 for #30 with no refills.  UDS/Contract 02/16/2021.  No future appointments.

## 2021-05-14 NOTE — Telephone Encounter (Signed)
Needs 3 month pain management visit for further refills... not sure why off schedule.

## 2021-05-28 ENCOUNTER — Ambulatory Visit: Payer: Medicare Other | Admitting: Family Medicine

## 2021-06-03 ENCOUNTER — Ambulatory Visit: Payer: Medicare Other | Admitting: Family Medicine

## 2021-06-11 ENCOUNTER — Other Ambulatory Visit: Payer: Self-pay | Admitting: Family Medicine

## 2021-08-12 ENCOUNTER — Ambulatory Visit: Admit: 2021-08-12 | Discharge: 2021-08-12 | Payer: Medicare (Managed Care)

## 2021-08-12 ENCOUNTER — Ambulatory Visit: Admit: 2021-08-12 | Payer: Medicare (Managed Care)

## 2021-08-12 DIAGNOSIS — Z131 Encounter for screening for diabetes mellitus: Secondary | ICD-10-CM

## 2021-08-12 LAB — COMPREHENSIVE METABOLIC PANEL, SERUM
ALT: 22 U/L (ref 7–52)
AST (SGOT): 25 U/L (ref 13–39)
Albumin: 4.2 g/dL (ref 3.5–5.7)
Alkaline Phosphatase: 77 U/L (ref 36–125)
Anion Gap: 8 mmol/L (ref 3–16)
BUN: 14 mg/dL (ref 7–25)
CO2: 27 mmol/L (ref 21–33)
Calcium: 9.6 mg/dL (ref 8.6–10.3)
Chloride: 107 mmol/L (ref 98–110)
Creatinine: 0.93 mg/dL (ref 0.60–1.30)
EGFR: >90
Glucose: 125 mg/dL (ref 70–100)
Osmolality, Calculated: 296 mOsm/kg (ref 278–305)
Potassium: 4.2 mmol/L (ref 3.5–5.3)
Sodium: 142 mmol/L (ref 133–146)
Total Bilirubin: 0.6 mg/dL (ref 0.0–1.5)
Total Protein: 6.9 g/dL (ref 6.4–8.9)

## 2021-08-12 LAB — LIPID PANEL
Cholesterol, Total: 179 mg/dL (ref 0–200)
HDL: 39 mg/dL (ref 60–92)
LDL Cholesterol: 121 mg/dL
Triglycerides: 95 mg/dL (ref 10–149)

## 2021-08-12 LAB — HEPATITIS C ANTIBODY: HCV Ab: REACTIVE

## 2021-08-12 LAB — HIV 1+2 ANTIBODY/ANTIGEN WITH REFLEX: HIV 1+2 AB/AGN: NONREACTIVE

## 2021-08-12 LAB — HEMOGLOBIN A1C: Hemoglobin A1C: 5.7 % — ABNORMAL HIGH (ref 4.0–5.6)

## 2021-08-12 MED ORDER — atorvastatin (LIPITOR) 20 MG tablet
20 | ORAL_TABLET | Freq: Every day | ORAL | 1 refills | Status: AC
Start: 2021-08-12 — End: 2022-02-14

## 2021-08-12 MED ORDER — vardenafiL (LEVITRA) 20 MG tablet
20 | ORAL_TABLET | Freq: Every day | ORAL | 2 refills | 9.00000 days | Status: AC | PRN
Start: 2021-08-12 — End: 2021-08-21

## 2021-08-12 MED ORDER — lisinopriL (PRINIVIL) 10 MG tablet
10 | ORAL_TABLET | Freq: Every day | ORAL | 1 refills | Status: AC
Start: 2021-08-12 — End: 2022-02-14

## 2021-08-12 NOTE — Unmapped (Signed)
CC: New patient to est care   Chief Complaint   Patient presents with   ??? Establish Care   ??? med ck     HPI: Will stop pain medications.  Is active to exercise to help with pain.      ROS: Please see HPI    Patient screened for depression:   PHQ-2 08/12/2021   Little interest or pleasure in doing things 0   Feeling down, depressed, or hopeless 0     Past Medical History:   Diagnosis Date   ??? Essential hypertension    ??? Hyperlipidemia    ??? Spinal stenosis of cervical region      Past Surgical History:   Procedure Laterality Date   ??? CERVICAL FUSION     ??? KNEE SURGERY       Family History   Problem Relation Age of Onset   ??? Colon Cancer Mother    ??? Alzheimer's disease Father      Social History     Socioeconomic History   ??? Marital status: Married     Spouse name: None   ??? Number of children: None   ??? Years of education: None   ??? Highest education level: None   Occupational History   ??? None   Tobacco Use   ??? Smoking status: Never Smoker   ??? Smokeless tobacco: Never Used   Substance and Sexual Activity   ??? Alcohol use: None   ??? Drug use: None   ??? Sexual activity: None   Other Topics Concern   ??? None   Social History Narrative    Married, step kids, retired      Surveyor, quantity Strain: Not on file   Physical Activity: Not on file   Stress: Not on file   Social Connections: Not on file   Housing Stability: Not on file     No Known Allergies  Home Meds:  Current Outpatient Medications   Medication Sig   ??? atorvastatin Take 1 tablet (20 mg total) by mouth daily.   ??? cholecalciferol (vitamin D3) Take by mouth.   ??? lisinopriL Take 1 tablet (10 mg total) by mouth daily.   ??? multivitamin Take 1 capsule by mouth daily.   ??? vardenafiL Take 1 tablet (20 mg total) by mouth daily as needed for Erectile Dysfunction.     No current facility-administered medications for this visit.     PE:  BP 111/64 (BP Location: Right arm, Patient Position: Sitting, BP Cuff Size: Large)    Pulse 84    Resp 12     Ht 5' 11 (1.803 m)    Wt 197 lb (89.4 kg)    SpO2 97%    BMI 27.48 kg/m??      Today's body mass index is 27.48 kg/m??.     General: Well appearing, well developed, well noursed. No acute distress.    Skin: No rashes or suspicious lesions  Cardiac: Regular rate and rhythm. No murmurs rubs or gallops.  Normal S1 and S2.    Respiratory: Clear to auscultation bilateral.  No respiratory distress.  No Tachypnea  Vascular: No edema  Psych: Alert and oriented.  Normal mood and affect     A/P: Mitchell Evans is a 60 y.o. male here for new patient visit to est care    Mitchell Evans was seen today for establish care and med ck.    Diagnoses and all orders for this visit:  Screening for diabetes mellitus (Primary)  -     Comprehensive Metabolic Panel, Serum; Future  -     Hemoglobin A1c; Future    Encounter for screening for HIV  -     HIV 1+2 Antibody/Antigen with Reflex; Future    Essential hypertension  -     lisinopriL (PRINIVIL) 10 MG tablet; Take 1 tablet (10 mg total) by mouth daily.  -     Comprehensive Metabolic Panel, Serum; Future    Mixed hyperlipidemia  -     atorvastatin (LIPITOR) 20 MG tablet; Take 1 tablet (20 mg total) by mouth daily.  -     Lipid Profile; Future    Need for hepatitis C screening test  -     Hepatitis C Antibody; Future    Erectile dysfunction, unspecified erectile dysfunction type  -     vardenafiL (LEVITRA) 20 MG tablet; Take 1 tablet (20 mg total) by mouth daily as needed for Erectile Dysfunction.    Medication monitoring encounter      Return in about 3 months (around 11/12/2021) for In Office Visit - annual wellness.    [name], MD  [facility]

## 2021-08-12 NOTE — Unmapped (Addendum)
Mitchell Evans [phone]

## 2021-08-16 NOTE — Unmapped (Signed)
I left a message asking pt to call back for message below.

## 2021-08-16 NOTE — Unmapped (Signed)
Call pt    Inform Hep C screening positive.  Sometimes this is a false positive but needs test to confirm.  If positive this is treatable by the liver specialist.  This is blood transmitted (not sex).      Lab ordered.  No appt needed.  Ok to eat    Also labs show mild prediabetes.  Work on exercise and low carb diet.    Your [name] is low.  This is your good cholesterol and helps protect you from heart disease.  Regular exercise, weight loss and substituting healthy fats like avocado and olive oil for less healthy options (saturated fat) and eating more whole grains and beans can help

## 2021-08-17 NOTE — Unmapped (Signed)
Notified pt of message.

## 2021-08-21 MED ORDER — sildenafiL (VIAGRA) 100 MG tablet
100 | ORAL_TABLET | ORAL | 2 refills | Status: AC | PRN
Start: 2021-08-21 — End: 2021-11-18

## 2021-08-23 ENCOUNTER — Ambulatory Visit: Admit: 2021-08-23 | Payer: Medicare (Managed Care)

## 2021-08-23 DIAGNOSIS — R768 Other specified abnormal immunological findings in serum: Secondary | ICD-10-CM

## 2021-08-23 LAB — HEPATITIS C RNA, QUANTITATIVE REFLEX TO GENOTYPING: International Units: NOT DETECTED [IU]/mL

## 2021-08-26 NOTE — Unmapped (Signed)
Call to inform Hep C is negative and other test was false positive    Mitchell Juneau, MD   [facility]

## 2021-08-26 NOTE — Unmapped (Signed)
msg given- msg closed

## 2021-11-08 ENCOUNTER — Ambulatory Visit: Admit: 2021-11-08 | Discharge: 2021-11-08 | Payer: Medicare (Managed Care)

## 2021-11-08 ENCOUNTER — Other Ambulatory Visit: Admit: 2021-11-08 | Payer: Medicare (Managed Care)

## 2021-11-08 DIAGNOSIS — Z0000 Encounter for general adult medical examination without abnormal findings: Secondary | ICD-10-CM

## 2021-11-08 LAB — COMPREHENSIVE METABOLIC PANEL, SERUM
ALT: 29 U/L (ref 7–52)
AST (SGOT): 27 U/L (ref 13–39)
Albumin: 4.4 g/dL (ref 3.5–5.7)
Alkaline Phosphatase: 79 U/L (ref 36–125)
Anion Gap: 10 mmol/L (ref 3–16)
BUN: 8 mg/dL (ref 7–25)
CO2: 27 mmol/L (ref 21–33)
Calcium: 9.6 mg/dL (ref 8.6–10.3)
Chloride: 104 mmol/L (ref 98–110)
Creatinine: 0.8 mg/dL (ref 0.60–1.30)
EGFR: >90
Glucose: 97 mg/dL (ref 70–100)
Osmolality, Calculated: 290 mOsm/kg (ref 278–305)
Potassium: 4.5 mmol/L (ref 3.5–5.3)
Sodium: 141 mmol/L (ref 133–146)
Total Bilirubin: 0.6 mg/dL (ref 0.0–1.5)
Total Protein: 7.4 g/dL (ref 6.4–8.9)

## 2021-11-08 LAB — HEPATITIS B SURFACE ANTIGEN: Hep B Surface Ag: NONREACTIVE

## 2021-11-08 LAB — CBC
Hematocrit: 37.6 % (ref 38.5–50.0)
Hemoglobin: 12.8 g/dL (ref 13.2–17.1)
MCH: 33.2 pg (ref 27.0–33.0)
MCHC: 34 g/dL (ref 32.0–36.0)
MCV: 97.6 fL (ref 80.0–100.0)
MPV: 10 fL (ref 7.5–11.5)
Platelets: 205 10*3/uL (ref 140–400)
RBC: 3.86 10*6/uL (ref 4.20–5.80)
RDW: 13 % (ref 11.0–15.0)
WBC: 11 10*3/uL (ref 3.8–10.8)

## 2021-11-08 LAB — LIPID PANEL
Cholesterol, Total: 120 mg/dL (ref 0–200)
HDL: 35 mg/dL (ref 60–92)
LDL Cholesterol: 63 mg/dL
Non-HDL Cholesterol, Calculated: 85 mg/dL (ref 0–129)
Triglycerides: 110 mg/dL (ref 10–149)

## 2021-11-08 LAB — HEMOGLOBIN A1C: Hemoglobin A1C: 5 % (ref 4.0–5.6)

## 2021-11-08 LAB — HIV 1+2 ANTIBODY/ANTIGEN WITH REFLEX: HIV 1+2 AB/AGN: NONREACTIVE

## 2021-11-08 LAB — CHLAMYDIA / GONORRHOEAE DNA URINE
Chlamydia Trachomatis DNA Urine: NEGATIVE
Neisseria gonorrhoeae DNA Urine: NEGATIVE

## 2021-11-08 LAB — PSA TOTAL, SCREENING: PSA: 3.7 ng/mL (ref 0.0–4.0)

## 2021-11-08 LAB — TREPONEMA PALLIDUM AB WITH REFLEX: Treponema Pallidum: NEGATIVE

## 2021-11-08 NOTE — Unmapped (Signed)
[facility]  [phone]  [place] [address]    Name:  Mitchell Evans Date of Birth: 1960/11/01 (61 y.o.)   MRN: [id number]    Date of Service:  11/08/2021       Subjective:     History of Present Illness:  Mitchell Evans is a(n) 61 y.o. male here today for annual wellness exam.    HRA/AWV (Annual Wellness Visit Questionnaire) 11/07/2021   Have things been going well for you in the past four weeks? Yes   During the past 4 weeks, would you rate your health in general as good to excellent? Yes   In the past 4 weeks, have you been bothered by bodily pains? Yes   Are you able to do at least moderate physical activity for up to two minutes? (such as walking up hill) Yes   Do you suffer from feeling tired or fatigued? No   Do you have trouble eating well? No   Do you have any problems with your teeth or dentures? Yes   Have you ever had deafness or trouble hearing in one or both ears? No   Do you have any trouble using the phone? No   In the past 2 weeks, have you had less pleasure or interest in things than usual?  No   In the past 2 weeks, have you been more down, depressed or hopeless?  No   In the past 4 weeks, was someone available if you needed and asked for help? (someone to talk to, tasks around the house, help taking care of yourself) Yes   Can you get to places out of walking distance without help? (For example, can you travel alone on buses or taxis, or drive your own car?) Yes   Can you go shopping for groceries or clothes without someone???s help? Yes   Can you prepare your own meals? Yes   Can you do your housework without help? Yes   Because of any health problems, do you need the help of another person with your personal care needs such as eating, bathing, dressing, or getting around the house? No   Can you handle your own money without help? Yes   Are you confident that you can control and manage most of your health problems? Yes   Do you take medication?  Yes    Do you ever have problems taking medications as prescribed? No   In the past 12 months, have you had five or more alcoholic drinks in 1 day?   No   Are you a smoker? No   Do you or have you had an opioid use disorder? No   Are you currently prescribed opioid pain medication? No   Do you exercise at least 3 days a week? Yes   Do you drive?     Yes   Are you having any difficulties when driving (getting lost, accidents, less confident)? No   Do you always fasten your seat belt in a car? Yes   Do you feel safe at home? Yes   Have you fallen 2 or more times in the past year? No   Are you afraid of falling?  No   Has your home been evaluated to reduce the risk for falls?  No   Do you sometimes get dizzy when standing up? No     Denies fever, unintentional weight loss.  Denies CP, palpitations, SOB or cough.  Denies abdominal pain, blood in stool, diarrhea.  Some constipation. Denies leg swelling.      I have reviewed the Health Risk Assessment Questionnaire completed by patient including depression and alcohol screening.  Comments:     Depression screening: performed, negative  Alcohol misuse screening: performed, negative  Memory Screening  Mini-cog:Pass       Other HPI / Medical issues or concerns for this visit:  HPI    Opioid use disorder: No  Opioid use: No    The following portions of the patient's history were reviewed and updated as appropriate:  past medical history, past surgical history, current medications, allergies, social history, family history and immunization history    Patient Care Team:  [name], MD as PCP - General (Family Medicine)    Review of Systems         Objective:     Vitals:    11/08/21 0916   BP: 135/66   BP Location: Left arm   Patient Position: Sitting   BP Cuff Size: Large   Pulse: 76   Resp: 12   SpO2: 98%   Weight: 193 lb (87.5 kg)   Height: 5' 11 (1.803 m)     Body mass index is 26.92 kg/m??.    Vision/hearing screen:No results found.    Physical Exam    General: Well  appearing, well nourished, well developed. No acute distress.  Skin: No rash or suspicious lesions  HEENT: TMs normal, no thyromegaly  Lymphatic: No LAD of anterior cervical and supraclavicular  Cardiac: Regular rate and rhythm. No murmurs rubs or gallops.  Normal S1 and S2.  No JVD. Radial pulse strong  Respiratory: Clear to auscultation bilateral.  No respiratory distress.  No tachypnea  Abdominal: Soft, non-tender, non-distended, flat, normal BS, No masses or HSM   Vascular: No edema  Neuro: 5/5 strength in all extremities  Psych: Alert and oriented.  Normal mood and affect          Assessment/Plan:     Mitchell Evans is a 61 y.o. here for Annual Wellness Visit here for Annual Wellness Visit    Patient Counseling and Personalized Prevention Plan    Risk Factors and Conditions Identified:   No additional risk factors or conditions identified.      Recommended Interventions or Referrals:  Orders Placed This Encounter   ??? CBC   ??? Chlamydia / Gonorrhoeae DNA Urine   ??? Comprehensive Metabolic Panel, Serum   ??? Hemoglobin A1c   ??? Hepatitis B surface antigen   ??? HIV 1+2 Antibody/Antigen with Reflex   ??? Lipid Profile   ??? Syphilis Screening Mitchell Evans)   ??? PSA Total, Screening   ??? GI Procedures        Discussion of advance directives:  Advance directives were discussed today and patient will complete new documents to be returned at a later date.    Discussion:  Discussed ADL support.  Diet counseling    Exercise counseling     Discussed psychosocial needs.  Discussed prevention of chronic disease and screening schedule reviewed.  Management and coordination of services     Goals    None       Health Maintenance   Upcoming Health Maintenance     Annual Medicare Wellness Visit (Yearly)  Overdue - never done    Comprehensive Physical Exam (Yearly)  Overdue - never done    Colorectal Cancer Screening (MyChart) (Colonoscopy - Every 5 Years)  Overdue - never done    Renal Function/GFR (Yearly)  Next  due on 08/12/2022    Diabetes Screening (Yearly)  Ordered on  11/08/2021    Depression Screening (Yearly)  Next due on 08/12/2022    Alcohol Misuse Screening (Yearly)  Next due on 11/08/2022    Lipid Panel (Every 5 Years)  Ordered on 11/08/2021    Immunization: DTaP/Tdap/Td (2 - Td or Tdap)  Next due on 11/15/2027        Additional Medical Problems addressed today    Problem List Items Addressed This Visit    None  Visit Diagnoses     Well adult exam    -  Primary    Relevant Orders    CBC    Chlamydia / Gonorrhoeae DNA Urine    Comprehensive Metabolic Panel, Serum    Hemoglobin A1c    Hepatitis B surface antigen    HIV 1+2 Antibody/Antigen with Reflex    Lipid Profile    Syphilis Screening Mitchell Evans)    PSA Total, Screening    H/O colonoscopy with polypectomy        Relevant Orders    GI Procedures    Family history of colon cancer        Relevant Orders    GI Procedures         BMI is above normal. Weight loss not recommended at this time.         Return in about 6 months (around 05/08/2022) for In Office Visit - med check.     [name], MD

## 2021-11-09 LAB — IRON STUDIES
% Iron Saturation: 16.6 % (ref 15.0–55.0)
Iron: 63 ug/dL (ref 50–212)
TIBC: 379 ug/dL (ref 261–462)

## 2021-11-09 LAB — VITAMIN B12: Vitamin B-12: 702 pg/mL (ref 180–914)

## 2021-11-09 LAB — FERRITIN: Ferritin: 14.5 ng/mL — ABNORMAL LOW (ref 23.9–336.2)

## 2021-11-09 NOTE — Unmapped (Signed)
UTR- mailbox is full   Community education officerending mychart     Sent Lab a message.

## 2021-11-09 NOTE — Unmapped (Signed)
Call pt    Since last check prediabetes resolved and cholesterol improved.  Great job.    Blood count mildly off.  MA to message lab to add B12, iron studies and ferritin to yesterday's labs to try to find a cause.  Will repeat at next visit.    Other labs ok    [name], MD  [place] [facility]

## 2021-11-10 NOTE — Unmapped (Signed)
Call pt    Inform B12 and iron normal regarding mild anemia.    Ordered a hemoglobin eval to see if has a different variation of hemoglobin that may be causing anemia.  Can just be done at next visit.      Fidela JuneauErin Omer Monter, MD   [facility]

## 2021-11-10 NOTE — Unmapped (Signed)
Notified pt of message

## 2021-11-19 NOTE — Unmapped (Signed)
11/08/2021 [name], MD  05/09/2022 [name], MD

## 2021-11-20 NOTE — Unmapped (Signed)
Returned call from the colo line to schedule colo. Mailbox was full, sent a message to [employer].

## 2021-11-22 MED ORDER — polyethylene glycol (GOLYTELY) 236-22.74-6.74 -5.86 gram solution
236-22.74-6.74 | ORAL | 0 refills | 14.00000 days | Status: AC
Start: 2021-11-22 — End: ?

## 2021-11-22 MED ORDER — sildenafiL (VIAGRA) 100 MG tablet
100 | ORAL_TABLET | ORAL | 5 refills | Status: AC | PRN
Start: 2021-11-22 — End: ?

## 2021-11-22 NOTE — Unmapped (Signed)
Pt called the colo line to schedule OA colo screening. Schedule pt for 03/09/2022 at 12:0pm [facility]. Sent all further details and directions via mychart.

## 2021-12-20 ENCOUNTER — Encounter: Payer: Self-pay | Admitting: Psychology

## 2021-12-20 NOTE — Progress Notes (Signed)
This encounter was created in error - please disregard.

## 2022-01-17 ENCOUNTER — Ambulatory Visit: Payer: Medicare (Managed Care)

## 2022-01-26 ENCOUNTER — Ambulatory Visit: Admit: 2022-01-26 | Discharge: 2022-01-31 | Payer: Medicare (Managed Care)

## 2022-01-26 DIAGNOSIS — M5432 Sciatica, left side: Secondary | ICD-10-CM

## 2022-01-26 DIAGNOSIS — M5431 Sciatica, right side: Secondary | ICD-10-CM

## 2022-01-26 MED ORDER — methylPREDNISolone (MEDROL, PAK,) 4 mg tablet
4 | ORAL | 0 refills | Status: AC
Start: 2022-01-26 — End: 2022-02-12

## 2022-01-26 NOTE — Unmapped (Signed)
Pre-Visit Preventative Plannning  Lab Results   Component Value Date    HGBA1C 5.0 11/08/2021     Health Maintenance   Topic Date Due   ??? Colorectal Cancer Screening (MyChart)  Never done   ??? Renal Function/GFR  11/08/2022   ??? Alcohol Misuse Screening  11/08/2022   ??? Diabetes Screening  11/08/2022   ??? Depression Screening  11/08/2022   ??? Annual Medicare Wellness Visit  11/08/2022   ??? Comprehensive Physical Exam  11/08/2022   ??? Lipid Panel  11/08/2026   ??? Immunization: DTaP/Tdap/Td (2 - Td or Tdap) 11/15/2027   ??? Hepatitis C Screening (MyChart)  Completed   ??? HIV Screening  Completed   ??? Immunization: COVID-19  Completed   ??? Immunization: Influenza (MyChart)  Completed   ??? Immunization: Zoster  Completed   ??? Immunization: Pneumococcal  Aged Out     Immunization History   Administered Date(s) Administered   ??? COVID-19, mRNA, Pfizer monovalent 12/25/2019, 01/22/2020, 07/14/2020, 01/14/2021   ??? Influenza, quadrivalent, preservative-free 06/26/2018, 05/31/2019, 06/15/2020, 07/17/2021   ??? Pneumococcal polysaccharide, 23-valent 06/16/2020   ??? Zoster, recombinant 09/22/2020, 01/19/2021   ??? tdap 11/14/2017     CC:   Chief Complaint   Patient presents with   ??? Sciatica     HPI: See A&P for details of HPI    ROS: Please see HPI    Home Meds:  Current Outpatient Medications   Medication Sig   ??? atorvastatin Take 1 tablet (20 mg total) by mouth daily.   ??? cholecalciferol (vitamin D3) Take by mouth.   ??? lisinopriL Take 1 tablet (10 mg total) by mouth daily.   ??? multivitamin Take 1 capsule by mouth daily.   ??? polyethylene glycol Please follow directions for colonoscopy.   ??? sildenafiL Take 1 tablet (100 mg total) by mouth if needed for Erectile Dysfunction. USE GOOD RX   ??? methylPREDNISolone follow package directions     No current facility-administered medications for this visit.     PE:  BP 136/73 (BP Location: Left arm, Patient Position: Sitting, BP Cuff Size: Large)    Pulse 82    Resp 16    Ht 5' 11 (1.803 m)    Wt 193 lb  (87.5 kg)    SpO2 92%    BMI 26.92 kg/m??   Wt Readings from Last 3 Encounters:   01/26/22 193 lb (87.5 kg)   11/08/21 193 lb (87.5 kg)   08/12/21 197 lb (89.4 kg)     Today's body mass index is 26.92 kg/m??.     Gen: No acute distress, well developed, well nourished  HEENT: Normocephalic, atraumatic  CV: Hemodynamically stable  Resp: No tachypnea or increased WOB  Neuro: A&O x 3, answering questions appropriately  Psych: Normal mood and affect, behavior appropriate    A/P: Mitchell Evans is a 61 y.o. male here for bilateral sciatica    Mitchell Evans was seen today for sciatica.    Diagnoses and all orders for this visit:    Bilateral sciatica  Assessment & Plan:  Has h/o epidural has been over 1 year.  Now has fully worn off.  Going down both legs and worse on left leg.  Does not want pain medication.  Wants referral for injection.      Orders:  -     Pain Clinic  -     methylPREDNISolone (MEDROL, PAK,) 4 mg tablet; follow package directions    [name], MD  [facility]  [facility]

## 2022-01-26 NOTE — Unmapped (Signed)
Has h/o epidural has been over 1 year.  Now has fully worn off.  Going down both legs and worse on left leg.  Does not want pain medication.  Wants referral for injection.

## 2022-02-07 ENCOUNTER — Ambulatory Visit: Payer: Medicare (Managed Care)

## 2022-02-07 ENCOUNTER — Inpatient Hospital Stay
Admit: 2022-02-07 | Discharge: 2022-02-07 | Disposition: A | Discharge status: Home / Self Care | Payer: Medicare (Managed Care)

## 2022-02-07 DIAGNOSIS — G5701 Lesion of sciatic nerve, right lower limb: Secondary | ICD-10-CM

## 2022-02-07 MED ORDER — cyclobenzaprine (FLEXERIL) tablet 10 mg
10 | Freq: Three times a day (TID) | ORAL | Status: AC | PRN
Start: 2022-02-07 — End: 2022-02-07
  Administered 2022-02-07: 16:00:00 10 mg via ORAL

## 2022-02-07 MED ORDER — lidocaine (LIDODERM) 5 % 1 patch
5 | TOPICAL | Status: AC
Start: 2022-02-07 — End: 2022-02-07
  Administered 2022-02-07: 16:00:00 1 via TRANSDERMAL

## 2022-02-07 MED ORDER — cyclobenzaprine (FLEXERIL) 10 MG tablet
10 | ORAL_TABLET | Freq: Every evening | ORAL | 0 refills | Status: AC | PRN
Start: 2022-02-07 — End: 2022-02-12

## 2022-02-07 MED ORDER — ketorolac (TORADOL) injection 15 mg
15 | Freq: Once | INTRAMUSCULAR | Status: AC
Start: 2022-02-07 — End: 2022-02-07
  Administered 2022-02-07: 16:00:00 15 mg via INTRAMUSCULAR

## 2022-02-07 MED ORDER — ibuprofen (MOTRIN) 800 MG tablet
800 | ORAL_TABLET | Freq: Three times a day (TID) | ORAL | 0 refills | Status: AC | PRN
Start: 2022-02-07 — End: ?

## 2022-02-07 MED FILL — KETOROLAC 15 MG/ML INJECTION SOLUTION: 15 15 mg/mL | INTRAMUSCULAR | Qty: 1

## 2022-02-07 MED FILL — CYCLOBENZAPRINE 10 MG TABLET: 10 10 MG | ORAL | Qty: 1

## 2022-02-07 MED FILL — LIDODERM 5 % TOPICAL PATCH: 5 5 % | TOPICAL | Qty: 1

## 2022-02-07 NOTE — Unmapped (Addendum)
I have written you a referral to pain management. I have also sent prescriptions to your pharmacy. Follow up with your doctor as needed.

## 2022-02-07 NOTE — Unmapped (Signed)
ED Attending Attestation Note    Date of service:  02/07/2022    This patient was seen by the resident physician.  I have seen and examined the patient, agree with the workup, evaluation, management and diagnosis. The care plan has been discussed and I concur.     My assessment reveals a 61 y.o. male with history of sciatica who now presents to the emergency department with sciatica flare.    Patient states that over the last few days has been having worsening pain of right-sided sciatica.  Patient has history of previous sciatica pain.  Denies any recent falls or trauma.    On examination find adult male, speaking in complete sentences.  No increased work of breathing or accessory muscle use during respiration.  Patient has no midline T or L-spine tenderness palpation.  Spastic paravertebral musculature in the right lumbar/gluteal region.  Symmetric strength and sensation in lower extremities.    We will proceed with symptomatic care and outpatient follow-up.    Fayrene Fearing, MD MPH   [facility] Emergency Medicine

## 2022-02-07 NOTE — Unmapped (Signed)
[facility] ED Note    Date of Service: 02/07/2022  Reason for Visit: Leg Pain      Patient History     HPI:  Mitchell Evans is a 61 y.o. male who presents emergency department with right-sided sciatic nerve pain.  The patient reports that he intermittently has flares of his sciatic nerve pain.  He reports that this flare has been ongoing for the past 2 to 3 days.  The pain is made worse with walking and he typically will have a sharp shooting pain down his leg.  He denies any injury or trauma.  The patient is able to ambulate without difficulty and has no weakness or focal neurological deficits.    Other than stated above, no additional aggravating or alleviating factors are noted.    Past Medical History:   Diagnosis Date   ??? Essential hypertension    ??? Hepatitis C 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 08/16/2021    Needs confirmed   ??? Hyperlipidemia    ??? Low serum HDL 08/16/2021   ??? Normocytic anemia 11/09/2021   ??? Prediabetes 08/16/2021   ??? Spinal stenosis of cervical region        Past Surgical History:   Procedure Laterality Date   ??? CERVICAL FUSION     ??? KNEE SURGERY         Mitchell Evans  reports that he has never smoked. He has never used smokeless tobacco. He reports current drug use. Drug: Marijuana. No history on file for alcohol use.    Discharge Medication List as of 02/07/2022  1:02 PM      START taking these medications    Details   cyclobenzaprine (FLEXERIL) 10 MG tablet Take 1 tablet (10 mg total) by mouth at bedtime as needed for Muscle spasms., Starting Mon 02/07/2022, Normal, Disp-10 tablet, R-0      ibuprofen (MOTRIN) 800 MG tablet Take 1 tablet (800 mg total) by mouth every 8 hours as needed for Pain., Starting Mon 02/07/2022, Normal, Disp-30 tablet, R-0         CONTINUE these medications which have NOT CHANGED    Details   atorvastatin (LIPITOR) 20 MG tablet Take 1 tablet (20 mg total) by mouth daily., Starting Thu 08/12/2021, Normal, Disp-90 tablet, R-1      cholecalciferol, vitamin D3, 125 mcg (5,000 unit) Tab  Take by mouth., Historical Med      lisinopriL (PRINIVIL) 10 MG tablet Take 1 tablet (10 mg total) by mouth daily., Starting Thu 08/12/2021, Normal, Disp-90 tablet, R-1      methylPREDNISolone (MEDROL, PAK,) 4 mg tablet follow package directions, Normal, Disp-21 each, R-0      multivitamin capsule Take 1 capsule by mouth daily., Historical Med      polyethylene glycol (GOLYTELY) 236-22.74-6.74 -5.86 gram solution Please follow directions for colonoscopy., Normal, Disp-4000 mL, R-0      sildenafiL (VIAGRA) 100 MG tablet Take 1 tablet (100 mg total) by mouth if needed for Erectile Dysfunction. USE GOOD RX, Starting Mon 11/22/2021, Normal, Disp-30 tablet, R-5             Allergies as of 02/07/2022   ??? (No Known Allergies)       All nursing notes and triage notes were appropriately reviewed in the course of the creation of this note.     Review of Systems   A comprehensive ROS was completed and negative unless otherwise stated above    Physical Exam     Vitals:  02/07/22 1129   BP: 131/70   BP Location: Right arm   Patient Position: Sitting   Pulse: 80   Resp: 18   Temp: 98.2 ??F (36.8 ??C)   TempSrc: Oral   SpO2: 99%     General:  Well appearing. No acute distress  Eyes:  PERRL. No discharge from eyes. No scleral icterus.  ENT:  No rhinorrhea or nasal discharge. OP clear  Neck:  Supple, trachea midline  Pulmonary:  Respirations are even and unlabored. Breath sounds are clear and equal bilaterally. Chest rise symmetrical.  Cardiac:  Regular rate and rhythm  Abdomen:  Soft. Non-distended. Non-tender.   Musculoskeletal:  No obvious deformity.   Negative straight leg raise.  Vascular:  Extremities warm and perfused. Normal pulses in all extremities.  Skin:  Dry, no rashes  Neuro: Alert and oriented x4. Normal speech. Moves all extremities spontaneously. Sensation grossly intact to light touch. Strength grossly normal and symmetric bilaterally. Gait narrow and stable.     Diagnostic Studies     Labs:  Labs Reviewed - No data  to display    Radiology:  No orders to display       EKG:  None    Emergency Department Procedures   None    ED Course and MDM     Suhayb Anzalone is a 61 y.o. male with a history and presentation as described above in HPI.  The patient was evaluated by myself and the ED Attending Physician, Dr. Fayrene Fearing. All management and disposition plans were discussed and agreed upon.    Upon presentation, the patient was well-appearing and hemodynamically stable.  Physical exam was reassuring, the patient had no red flag symptoms or focal neurological deficits making cauda equina and other spinal pathology unlikely.  Patient's presentation is most likely consistent with flare of sciatic nerve pain.  The patient was given Flexeril and Toradol in the emergency department and on reassessment had significant improvement of his discomfort.  He was sent prescriptions for a few days of Flexeril and ibuprofen and also requested a pain management referral.  The patient was given this and ultimately deemed safe for discharge home.    Medications received during this ED visit:  Medications   ketorolac (TORADOL) injection 15 mg (15 mg Intramuscular Given 02/07/22 1223)       Impression     1. Neuropathy of right sciatic nerve        Plan   -Discharge home    Lavell Islam, MD, PGY-2  [facility]     Lavell Islam, MD  Resident  02/08/22 [phone]

## 2022-02-07 NOTE — Unmapped (Signed)
Pt c/o sciatic nerve pain.  Known cervical stenosis.  Pt kneeling at triage desk d/t 10/10 pain.  Ambulatory. A&Ox4.

## 2022-02-10 NOTE — Unmapped (Signed)
01/26/2022 [name], MD  05/09/2022 [name], MD

## 2022-02-12 ENCOUNTER — Inpatient Hospital Stay
Admit: 2022-02-12 | Discharge: 2022-02-12 | Disposition: A | Discharge status: Home / Self Care | Payer: Medicare (Managed Care)

## 2022-02-12 DIAGNOSIS — M5412 Radiculopathy, cervical region: Secondary | ICD-10-CM

## 2022-02-12 MED ORDER — oxyCODONE (ROXICODONE) immediate release tablet 5 mg
5 | Freq: Once | ORAL | Status: AC
Start: 2022-02-12 — End: 2022-02-12
  Administered 2022-02-12: 14:00:00 5 mg via ORAL

## 2022-02-12 MED ORDER — oxyCODONE (ROXICODONE) 5 MG immediate release tablet
5 | ORAL_TABLET | Freq: Four times a day (QID) | ORAL | 0 refills | 6.00000 days | Status: AC | PRN
Start: 2022-02-12 — End: 2022-02-14

## 2022-02-12 MED ORDER — cyclobenzaprine (FLEXERIL) 10 MG tablet
10 | ORAL_TABLET | Freq: Three times a day (TID) | ORAL | 0 refills | Status: AC | PRN
Start: 2022-02-12 — End: 2022-02-14

## 2022-02-12 MED ORDER — pregabalin (LYRICA) capsule 150 mg
150 | Freq: Once | ORAL | Status: AC
Start: 2022-02-12 — End: 2022-02-12
  Administered 2022-02-12: 14:00:00 150 mg via ORAL

## 2022-02-12 MED ORDER — pregabalin (LYRICA) 150 MG capsule
150 | ORAL_CAPSULE | Freq: Two times a day (BID) | ORAL | 0 refills | Status: AC
Start: 2022-02-12 — End: 2022-02-14

## 2022-02-12 MED FILL — PREGABALIN 150 MG CAPSULE: 150 150 MG | ORAL | Qty: 1

## 2022-02-12 MED FILL — OXYCODONE 5 MG TABLET: 5 5 MG | ORAL | Qty: 1

## 2022-02-12 NOTE — Unmapped (Signed)
Has appointment on MOnday with pain MD

## 2022-02-12 NOTE — Unmapped (Signed)
[facility] ED Note    Date of service: 02/12/2022    Reason for Visit: Extremity Weakness      Patient History     HPI:  Mitchell Evans is a 61 y.o. male with PMHx of HTN, sciatica, and cervical stenosis s/p C6-7 ACDF who presents with a chief complaint of left neck and upper extremity pain.     Patient reports extensive history of cervical spinal stenosis status post surgical intervention. Patient is followed by chronic pain in [place] where he used to receive fluoroscopic guided steroid shots for symptomatic relief. In addition, patient used to take oxycodone, ibuprofen, Tylenol, Lyrica, and Flexeril for symptom control. Patient recently moved to [place] and has been establishing care here. He has an appointment scheduled on Monday with pain medicine to transfer care and appropriate evaluation for his pain. States that he has been taking his Tylenol, ibuprofen, and doing his stretch exercises without significant improvement of his symptoms. States that the pain is to the point which he is unable to function or get comfortable at home. Endorses that the pain is located mainly in the left side of his neck and in his left upper extremity. Patient has chronic deficits because of his spinal stenosis, including weakness with left grip strength. Denies any acute worsening of his neurological symptoms but just endorses persistent pain that he is unable to get control of. Aside from the above, patient denies any aggravating or alleviating factors or associated symptoms.     MRI C-spine (2020)  1. 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 Marrow edema there is superimposed on chronic disc and posterior element degeneration which results in mild spinal stenosis and up to moderate right C3 foraminal stenosis.  2. Chronic C6-C7 ACDF with solid arthrodesis.  3. Advanced disc and endplate  degeneration C3-C4 through C5-C6 with multifactorial spinal stenosis at each level. Up to moderate associated spinal cord mass effect, but no cord signal abnormality. Severe bilateral C4, C5 and left greater than right C6 foraminal stenosis.  4. C7-T1 degeneration with mild spinal stenosis, severe left C8 foraminal stenosis.    Past Medical History:   Diagnosis Date   ??? Essential hypertension    ??? Hepatitis C 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 08/16/2021    Needs confirmed   ??? Hyperlipidemia    ??? Low serum HDL 08/16/2021   ??? Normocytic anemia 11/09/2021   ??? Prediabetes 08/16/2021   ??? Spinal stenosis of cervical region      Past Surgical History:   Procedure Laterality Date   ??? CERVICAL FUSION     ??? KNEE SURGERY       [name]  reports that he has never smoked. He has never used smokeless tobacco. He reports current drug use. Drug: Marijuana. No history on file for alcohol use.    Previous Medications    ATORVASTATIN (LIPITOR) 20 MG TABLET    Take 1 tablet (20 mg total) by mouth daily.    CHOLECALCIFEROL, VITAMIN D3, 125 MCG (5,000 UNIT) TAB    Take by mouth.    IBUPROFEN (MOTRIN) 800 MG TABLET    Take 1 tablet (800 mg total) by mouth every 8 hours as needed for Pain.    LISINOPRIL (PRINIVIL) 10 MG TABLET    Take 1 tablet (10 mg total) by mouth daily.    MULTIVITAMIN CAPSULE    Take 1 capsule by mouth daily.    POLYETHYLENE GLYCOL (  GOLYTELY) 236-22.74-6.74 -5.86 GRAM SOLUTION    Please follow directions for colonoscopy.    SILDENAFIL (VIAGRA) 100 MG TABLET    Take 1 tablet (100 mg total) by mouth if needed for Erectile Dysfunction. USE GOOD RX     Allergies:   Allergies as of 02/12/2022   ??? (No Known Allergies)     Review of Systems     ROS:  A comprehensive review of systems was performed and negative except as noted previously.    Physical Exam     ED Triage Vitals [02/12/22 0846]   Vital Signs Group      Temp 97.7 ??F (36.5 ??C)      Temp Source Oral      Heart Rate 75      Heart Rate Source Monitor      Resp 18      SpO2  96 %      BP 147/70      MAP (mmHg) 88      BP Location Right arm      BP Method Automatic      Patient Position Sitting   SpO2 96 %   O2 Device None (Room air)     Constitutional:  61 y.o. male, well nourished; well developed; in no apparent distress   HEENT:  Normocephalic, atraumatic. Mucous membranes are moist. External ears are grossly normal.   Eyes:  Sclera anicteric, PERRL, EOMI grossly intact.  Neck:  Supple, full ROM, trachea midline. No anterior/posterior cervical LAD. L neck w/ TTP.   Pulmonary:  Respiratory effort WNL. Lungs CTABL with symmetric aeration. No wheezes, rales or rhonchi appreciated.     Cardiac:  Regular rate and rhythm.  No murmurs, rubs or gallops appreciated.   Abdomen:  Soft, non-tender, non-distended. No palpable organomegaly.   Extremities:  2+ radial pulses. No extremity deformity, swelling or tenderness appreciated. Unable to actively range RUE above 90 degrees due to significant pain. Patient tolerating passive ROM but reports significant exacerbation of pain with ROM above 90 degrees. R hand grip strength diminished compared to L hand.   Skin:  No rashes or bruising.  Neuro:  Alert and oriented x3. Speech normal. Moves UE and LE spontaneously and symmetrically. CN II-XII grossly intact, no gross focal deficits.  Psych:  Mood and affect appropriate for situation. Cooperative with interview and exam.     Diagnostic Studies     Labs:    Please see electronic medical record for any tests performed in the ED     Radiology:    Please see electronic medical record for any tests performed in the ED    Emergency Department Procedures     Procedures    ED Course and MDM     [name] is a 61 y.o. male with a history and presentation as described above in HPI.  The patient was evaluated by myself, the ED Attending Physician, Dr. Jules Husbands, MD. All management and disposition plans were discussed and agreed upon. Appropriate labs and diagnostic studies were reviewed as they were  made available. Pertinent laboratory studies in medical decision making are listed below.     Upon presentation, the patient was HDS and well-appearing. Physical exam was concerning for left neck and on [place] pain with active range of motion. Left arm pain minimally exacerbated with passive range of motion. Left hand grip strength diminished, at baseline. Given clinical presentation and physical exam, concern for acute on chronic pain. Patient has no new neurological  deficits to warrant further imaging or work-up. Chart reviewed and found that patient was taking oxycodone, Lyrica, Flexeril, ibuprofen, and Tylenol outpatient for symptom control. Patient given a dose of his oxycodone and Lyrica in the emergency department as he had tried Flexeril last night which helped him sleep but did not improve his pain significantly. Post oxycodone and Lyrica, patient reports that his symptoms are significantly improved. We will give patient a short prescription of oxycodone and Lyrica to cover him over the weekend until he follows up with his pain medicine clinic appointment on Monday. Patient verbalized understanding.    Risks, benefits, and alternatives were discussed. At this time the patient has been deemed safe for discharge. My customary discharge instructions including strict return precautions for worsening or new symptoms have been communicated.    Medical Decision Making  Neck pain with history of cervical spinal surgery: acute illness or injury  Risk  OTC drugs.  Prescription drug management.      Consults:  None    Summary of Treatment in ED:  Medications   oxyCODONE (ROXICODONE) immediate release tablet 5 mg (5 mg Oral Given 02/12/22 0952)   pregabalin (LYRICA) capsule 150 mg (150 mg Oral Given 02/12/22 0952)     Impression     1. Neck pain with history of cervical spinal surgery         Kaleen Mask, MD  PGY-2 Emergency Medicine    This note was dictated using voice-recognition software, which occasionally  leads to inadvertent typographic errors.     Kaleen Mask, MD  Resident  02/12/22 [phone]

## 2022-02-12 NOTE — Unmapped (Signed)
ED Attending Attestation Note    Date of service:  02/12/2022    This patient was seen by the Resident Physician.  I have seen and examined the patient, agree with the workup, evaluation, management and diagnosis. The care plan has been discussed and I concur.       I was present for the following procedures: None    My assessment reveals a 61 y.o. male with a history of cervical radiculopathy who comes in because he ran out of his regular medications.  The patient is transitioning his care from [place] to [place].  He has run out of his normal medications.  He already has an appointment to see a pain management doctor in 2 days, on Monday.  He reports worsening of this radiculopathy pain that he has had in the past.  He reports chronic paresthesias and weakness in the left arm that is not new, and he has had evaluations for in the past with MRI.  Otherwise unremarkable exam.      Jules Husbands, MD

## 2022-02-12 NOTE — Unmapped (Addendum)
You were seen in the emergency department for neck and left arm pain. Your residual weakness from your cervical spinal stenosis and surgery are unchanged. You were given a dose of your oxycodone and Lyrica in the emergency department with improvement of your pain. You been given a short prescription of oxycodone, Lyrica, and Flexeril that should cover you for the next 2 days until you see your pain doctor on Monday. Please do not miss your appointment on Monday as they are the ones who will manage your pain long-term.    Return to the emergency department if you experience any of the following:  Your pain gets worse and medicine does not help.  You lose feeling or feel weak in your hand, arm, face, or leg.  You have a high fever.  Your neck is stiff.  You cannot control when you poop or pee (have incontinence).  You have trouble with walking, balance, or talking.

## 2022-02-12 NOTE — Unmapped (Signed)
Hx cervical stenosis - moved from [place] recently - unable to get appointment with pain MD until Monday - left hand grasp weaker than right

## 2022-02-14 ENCOUNTER — Ambulatory Visit: Payer: Medicare (Managed Care)

## 2022-02-14 MED ORDER — lisinopriL (PRINIVIL) 10 MG tablet
10 | ORAL_TABLET | ORAL | 1 refills | Status: AC
Start: 2022-02-14 — End: ?

## 2022-02-14 MED ORDER — atorvastatin (LIPITOR) 20 MG tablet
20 | ORAL_TABLET | ORAL | 1 refills | Status: AC
Start: 2022-02-14 — End: ?

## 2022-02-21 ENCOUNTER — Ambulatory Visit: Admit: 2022-02-21 | Discharge: 2022-02-21 | Payer: Medicare (Managed Care)

## 2022-02-21 DIAGNOSIS — M5412 Radiculopathy, cervical region: Secondary | ICD-10-CM

## 2022-02-21 MED ORDER — pregabalin (LYRICA) 150 MG capsule
150 | ORAL_CAPSULE | Freq: Two times a day (BID) | ORAL | 1 refills | Status: AC
Start: 2022-02-21 — End: 2022-04-22

## 2022-02-21 MED ORDER — lidocaine (LIDODERM) 5 %
5 | MEDICATED_PATCH | TOPICAL | 1 refills | 30.00000 days | Status: AC
Start: 2022-02-21 — End: 2022-04-22

## 2022-02-21 NOTE — Unmapped (Signed)
Chief Complaint   Patient presents with   ??? Back Pain     Lower back    ??? Muscle Pain   ??? Arm Pain       Left arm    ??? Leg Pain     Left leg         History of Present Illness  HPI     Mitchell Evans is a 61 y.o. male who is referred to our pain management clinic for consultation, evaluation and treatment of left upper extremity pain. Referral from Lavell Islam MD (ED).     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 He has had multiple back injections from [place] from a pain clinic there which gave him varying degrees of relief, the longest being about 6 months maybe. Low back pain is 6/10 on NRS, at maximum is 10/10. Pain is sharp and stabbing in nature. Pain is referred to bilateral posterior thighs, past the knees, to the feet (left more than the right). The pain is constant. The pain is improved by injections and oral medications. The pain is worse with prolonged sitting and standing. No problems with ambulation but does use a cane intermittently for support.     Neck pain and bilateral upper extremity pain started around 2003. He had ACDF 2003 due to this. His pain persistent post-surgery but better. He has had one neck injection from [place] which helped. Neck pain is 10/10 on NRS. Pain is sharp and stabbing in nature. Pain is referred bilateral hands and fingers (left hurts more than the right). The pain is constant. The pain is improved by lidocaine patches, Biofreeze, compression stockings. The pain is worse with prolonged sitting and standing.    Last PT done years ago. Still continues to do stretching and home exercises. States the pain clinic in [place] started him on oxycodone 5 mg (?) for years TID and last took them in March 2023. He went to his PCP here in [place] to possibly continue oxycodone but informed to go to pain management instead. He just  recently gotten his medical marijuana card. States he does not intend to use medical marijuana for longer periods, just for breakthrough pain.     Denies cigarette smoking and alcohol use.    Recently seen at ED 4/24 then 4/29 due to L neck and UE pain and was given flexeril, motrin, toradol (effective), medrol dose (effective).  Was given lyrica 150 BID x 2 days and oxycodone x 2 days from ED visit on April 28.  Recently moved to [place] from [place], [place] for personal reasons and also to get medical marijuana card.     Current Medications  Biofreeze  Lidocaine patch    Previous Interventions:  Most recent intervention:  01/07/21; [facility], [place] (Left L4/5 LESI)  12/11/2017 S1 Lumbosacral Transforaminal Epidural Steroid Injection - Sub-Pedicular Approach with Fluoroscopic Guidance  L  S1 foramen  01/09/2018 Lumbosacral Transforaminal Epidural Steroid Injection - Sub-Pedicular Approach with Fluoroscopic Guidance B L4-L5   07/03/2019 Lumbosacral Transforaminal Epidural Steroid Injection - Sub-Pedicular Approach with Fluoroscopic Guidance  B L4-L5  07/25/2019 Cervical Facet Joint Intra-Articular Injection with Fluoroscopic Guidance  C2-3  11/06/2019 Lumbosacral Transforaminal Epidural Steroid Injection - Sub-Pedicular Approach with Fluoroscopic Guidance  Bilateral L4-L5  03/02/2020 Lumbosacral Transforaminal Epidural Steroid Injection - Sub-Pedicular Approach with Fluoroscopic  Guidance Bilateral L4-5  06/05/2020 S1 Lumbosacral Transforaminal Epidural Steroid Injection - Sub-Pedicular Approach with Fluoroscopic Guidance  S1 foramen  08/13/2020 Left L4-L5 TFESI  01/07/2021 Left L4-L5 TFESI       Past Medications:  OXYCODONE - Effective; Percocet- effective  CYCLOBENZAPRINE - Not Effective  GABAPENTIN - Not Effective and PREGABLIN - Effective  IBUPROFEN - Not Effective   Biofreeze, lidocaine patch    Past Modalities:  TENS:      No  Physical Therapy within last 6 months: No  Chiropractor:     Yes  (years ago)  Massage Therapy:    Yes  Psychotherapy:    Yes (Trying to find a new one since recent move)    Patient Complains of:  Uro-fecal Incontinence:     No  Weight Gain/Loss:    No  Fever/Chills:     No  Weakness:     No      The following portions of the patient's history were reviewed and updated as appropriate: allergies, current medications, past family history, past medical history, past social history, past surgical history and problem list.    Histories  He has a past medical history of Essential hypertension, Hepatitis C 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 (08/16/2021), Hyperlipidemia, Low serum HDL (08/16/2021), Normocytic anemia (11/09/2021), Prediabetes (08/16/2021), and Spinal stenosis of cervical region.    He has a past surgical history that includes Cervical fusion and Knee surgery.    His family history includes Alzheimer's disease in his father; Colon Cancer in his mother.    He reports that he has never smoked. He has never used smokeless tobacco. He reports current drug use. Drug: Marijuana.    Allergies  Patient has no known allergies.    Medications  Outpatient Encounter Medications as of 02/21/2022   Medication Sig Dispense Refill   ??? atorvastatin (LIPITOR) 20 MG tablet TAKE 1 TABLET DAILY 90 tablet 1   ??? cholecalciferol, vitamin D3, 125 mcg (5,000 unit) Tab Take by mouth.     ??? ibuprofen (MOTRIN) 800 MG tablet Take 1 tablet (800 mg total) by mouth every 8 hours as needed for Pain. 30 tablet 0   ??? lisinopriL (PRINIVIL) 10 MG tablet TAKE 1 TABLET DAILY 90 tablet 1   ??? multivitamin capsule Take 1 capsule by mouth daily.     ??? polyethylene glycol (GOLYTELY) 236-22.74-6.74 -5.86 gram solution Please follow directions for colonoscopy. 4000 mL 0   ??? sildenafiL (VIAGRA) 100 MG tablet Take 1 tablet (100 mg total) by mouth if needed for Erectile Dysfunction. USE GOOD RX 30 tablet 5   ??? [EXPIRED] cyclobenzaprine (FLEXERIL) 10 MG tablet Take 1 tablet (10 mg total) by mouth 3 times a day as needed for Muscle spasms  for up to 2 days. 6 tablet 0   ??? lidocaine (LIDODERM) 5 % Place 1 patch onto the skin daily for 60 days. Apply patch for 12 hours and then remove patch and leave off for 12 hours. 30 patch 1   ??? [EXPIRED] oxyCODONE (ROXICODONE) 5 MG immediate release tablet Take 1 tablet (5 mg total) by mouth every 6 hours as needed for Pain for up to 2 days. 8 tablet 0   ??? pregabalin (LYRICA) 150 MG capsule Take 1 capsule (150 mg total) by mouth 2 times a day for 60 days. 60 capsule 1   ??? [DISCONTINUED] atorvastatin (LIPITOR) 20 MG tablet Take 1 tablet (20 mg total) by mouth daily. 90 tablet 1   ??? [DISCONTINUED] lisinopriL (PRINIVIL) 10 MG  tablet Take 1 tablet (10 mg total) by mouth daily. 90 tablet 1   ??? [DISCONTINUED] pregabalin (LYRICA) 150 MG capsule Take 1 capsule (150 mg total) by mouth 2 times a day for 2 days. 4 capsule 0     No facility-administered encounter medications on file as of 02/21/2022.        Review of Systems   Musculoskeletal: Positive for back pain and myalgias.   All other systems reviewed and are negative.      Vitals  Blood pressure 151/79, pulse 68, temperature 96.8 ??F (36 ??C), resp. rate 16, height 5' 11 (1.803 m), weight 193 lb (87.5 kg), SpO2 96 %.    Physical Exam  Constitutional:       Appearance: Normal appearance.   HENT:      Right Ear: External ear normal.      Left Ear: External ear normal.      Nose: Nose normal.   Pulmonary:      Effort: Pulmonary effort is normal.   Musculoskeletal:      Cervical back: Tenderness present.        Back:         Legs:       Comments: Antalgic gait  Positive lower neck tenderness  Spurling's test positive bilateral  Spinal and paraspinal tenderness found on exam in the lumbar region.  Straight leg raise test positive bilateral  Muscle strength 4/5 on both UE and LE  Intact sensation to light touch   Neurological:      Mental Status: He is alert.   Psychiatric:         Behavior: Behavior normal.         Thought Content: Thought content normal.         Judgment:  Judgment normal.        Neurologic Exam   Ortho Exam    Activities of Daily Living:  1. Activity Level - patient indicates as   and performs independently.  2. Sleep Patterns - patient indicates as   and performs independently.                                 - indicates an average of 6+ hours of sleep per night.  3. Motivation - patient indicates as the same and performs independently.  4. Functional Level - patient indicates as the same and performs independently.           (a) Bathing - patient indicates as the same and performs independently.           (b) Dressing - patient indicates as the same and performs independently.           (c) Transferring from bed/chair - patient indicates as the same and performs independently.           (d) Walking - patient indicates as the same and performs independently.           (e) Eating - patient indicates as th esame and performs independently.           (f) Toilet Use - patient indicates as the same and performs independently.           (g) Personal Hygiene - patient indicates as the same and performs independently.  Notes:      Review of Lab Results  Lab Results   Component Value Date    WBC 11.0 (H) 11/08/2021  RBC 3.86 (L) 11/08/2021    HGB 12.8 (L) 11/08/2021    HCT 37.6 (L) 11/08/2021    MCV 97.6 11/08/2021    MCH 33.2 (H) 11/08/2021    MCHC 34.0 11/08/2021    RDW 13.0 11/08/2021    PLT 205 11/08/2021    MPV 10.0 11/08/2021    BUN 8 11/08/2021       [name]/e[place] Documentation (since 02/22/2019)     None      Last Urine Drug Screen    No lab values to display.        Pain Agreement -- Encounter Level:    Pain Agreement: None found at the encounter level.     Pain Agreement -- Patient Level:    Pain Agreement: None found at the patient level.         No results found for this or any previous visit.   I have completed the required [name] documentation for this patient on 02/21/2022.  LADY CHRISTINE ONG SIO    Chronic Assessment Tools:  PEG Total Score: 30  PDI Total  Score:   56  ORT Total Score (MALE): 0  ORT Total Score (MALE):    ORT Risk Category (Male): Low  ORT Risk Category (Male):    SOAPP Total Score: 4  SOAPP Indication: Negative    Investigations Reviewed:   MRI CERVICAL SPINE WITHOUT CONTRAST     TECHNIQUE:   Multiplanar, multisequence MR imaging of the cervical spine was   performed. No intravenous contrast was administered.     COMPARISON:  Cervical spine radiographs 02/23/2017. Cervical spine   CT 06/29/2016.     FINDINGS:   Alignment: Stable since 2018 with straightening and mild reversal of   cervical lordosis. Chronic mild retrolisthesis of C3 on C4.     Vertebrae: Chronic C6-C7 ACDF with evidence of solid arthrodesis.   Mild associated susceptibility artifact. Right side confluent marrow   edema in chronically degenerated right C2 and C3 facets (series 7,   image 2). Up to mild surrounding soft tissue inflammation. Bone   marrow signal elsewhere appears normal. No other No acute osseous   abnormality identified.     Cord: No cervical spinal cord signal abnormality despite multilevel   mass effect on the cord, see below.     Posterior Fossa, vertebral arteries, paraspinal tissues:   Cervicomedullary junction is within normal limits. Negative visible   posterior fossa. Partially empty sella. Major vascular flow voids in   the neck are preserved. Negative visible neck soft tissues aside   from those adjacent to the right C2-C3 facets.     Disc levels:     C2-C3: Circumferential disc bulge and endplate spurring is   broad-based and eccentric to the left posteriorly. See series 9,   image 6. Mild spinal stenosis. Mild if any cord mass effect.   Superimposed facet hypertrophy which is moderate on the right.   Associated right side facet edema. Mild left and moderate right C3   foraminal stenosis.     C3-C4: Mild chronic retrolisthesis with disc space loss and   circumferential disc osteophyte complex. Broad-based left   paracentral component (series 9, image  10). Spinal stenosis with   mild to moderate spinal cord mass effect. Severe bilateral C4   foraminal stenosis.     C4-C5: Disc space loss with circumferential disc osteophyte complex.   Broad-based right eccentric posterior component (series 9, image   15). Spinal stenosis with mild to moderate spinal cord  mass effect.   Severe bilateral C5 foraminal stenosis.     C5-C6: Disc space loss with circumferential disc osteophyte complex.   Bulky and broad-based right paracentral component (series 8, image   20). Mild posterior element hypertrophy. Spinal stenosis with mild   spinal cord mass effect. Severe left and moderate to severe right C6   foraminal stenosis.     C6-C7:  Prior ACDF with solid arthrodesis and no stenosis.     C7-T1: Disc space loss with circumferential disc osteophyte complex,   bulky left far lateral component and broad-based posterior   component. Mild posterior element hypertrophy. Mild spinal stenosis,   up to mild spinal cord mass effect. Severe left and mild to moderate   right C8 foraminal stenosis.     Mild T1-T2 disc bulging without significant upper thoracic spinal   stenosis.     IMPRESSION:   1. The symptomatic abnormality is favored to be acute exacerbation   of chronic facet joint arthritis on the right at C2-C3. Marrow edema   there is superimposed on chronic disc and posterior element   degeneration which results in mild spinal stenosis and up to   moderate right C3 foraminal stenosis.   2. Chronic C6-C7 ACDF with solid arthrodesis.   3. Advanced disc and endplate degeneration C3-C4 through C5-C6 with   multifactorial spinal stenosis at each level. Up to moderate   associated spinal cord mass effect, but no cord signal abnormality.   Severe bilateral C4, C5 and left greater than right C6 foraminal   stenosis.   4. C7-T1 degeneration with mild spinal stenosis, severe left C8   foraminal stenosis.       MRI LUMBAR SPINE WITHOUT CONTRAST 07/31/2020  FINDINGS:   Segmentation:  Standard.    Alignment: Stable with mild retrolisthesis at L3-L4 and L5-S1 and   anterolisthesis at L4-L5.     Vertebrae: Stable vertebral body heights. No substantial marrow   edema. Probable small vertebral body hemangioma at L1.     Conus medullaris and cauda equina: Conus extends to the L1 level.   Conus and cauda equina appear normal.     Paraspinal and other soft tissues: Unremarkable apart from chronic   postoperative changes.     Disc levels:   L1-L2:  No canal or foraminal stenosis.   L2-L3:  No canal or foraminal stenosis.   L3-L4: Disc bulge with superimposed right foraminal protrusion,   endplate osteophytic ridging, and mild facet arthropathy with   ligamentum flavum infolding. Mild canal stenosis. Partial effacement   of the subarticular recesses. Increased mild to moderate right   foraminal stenosis. Similar mild left foraminal stenosis.   L4-L5: Anterolisthesis with uncovering of disc bulge with   superimposed right foraminal protrusion, endplate osteophytic   ridging, and marked facet arthropathy with ligamentum flavum   infolding. Increased marked canal stenosis with effacement of the   subarticular recesses. Similar moderate foraminal stenosis, right   greater than left.   L5-S1: Prior left laminectomy with unchanged small cyst/chronic   fluid collection within the laminectomy bed. Disc bulge slightly   eccentric to the right, endplate osteophytic ridging, and facet   arthropathy with residual ligamentum flavum infolding. Mild canal   stenosis. Partial effacement the subarticular recesses. Mild   foraminal stenosis. Appearance is similar.     IMPRESSION:   Lower lumbar degenerative changes as detailed above with some   progression since 2019. Notably, there is increased canal stenosis   at L4-L5 with subarticular  recess effacement. Foraminal stenosis   remains greatest at this level as well.        ASSESSMENT:  Lumbar radiculopathy  Cervical radiculopathy  Lumbar spondylosis  DDD lumbar spine  Post  laminectomy syndrome    PLAN:  1. ??New patient visit, urine drug screen per office policy. UDS deferred.  The UDS is medically necessary to monitor for compliance due to the potential side effects and complications of misuse of opioids. UDS done today will review on next visit.  Last marijuana approximately 2 weeks ago.   2. Discussed with the patient that pain medicine physicians use a variety of evidence based treatment modalities in order to treat various painful conditions. This may include referral to physical therapy, the use of interventional procedures, referral for psychological assessment and treatment, and the use of both opioid and non-opioid medications as appropriate for the treatment of pain. Informed the patient that opioids have not been proven to be effective for the long term treatment chronic pain conditions, hence our conservative use of these medications. Discussed with the patient that our goal is to use a variety of treatment options that may be indicated for their condition in order to manage their pain and improve their overall functionality. The patient expressed understanding.  3. We discussed trying a course of formal physical therapy.  Physical therapy can help strengthen and stretch the muscles around the joints. Continue to be as active as possible. Start physical therapy as it will help generalized pain and follow up with HEP. Referral sent today.  4. Will restart Lyrica at 150mg  BID. Discussed side effects including but not limited to dizziness, sleepiness, dry mouth, swelling of the hands and feet, blurred vision, weight gain, trouble concentrating, and feeling ???high.??? These side effects are generally mild to moderate. Patient understands and agrees with the plan.   5. Discussed with the patient regarding the etiology of low back pain radiating to bilateral lower extremities to the feet. Informed them that they would likely benefit from a caudal ESI under fluoroscopic guidance. The  procedure was described in detail and the risks, benefits and alternatives were discussed with the patient (including but not limited to: bleeding, infection, nerve damage, worsening of pain).  Will schedule once insurance approval is obtained.  6. Consider CESI in the future.  7. Start Lidocaine patch to the painful area, Apply  as needed to painful areas, do not apply to open skin, genitalia or mucosa. Possible side effects may include irritation at the application site- burning, itching, rash and redness or allergic reaction. Patient understands and agrees with the plan.       Patient is to return to clinic for a follow up visit in 4 weeks or sooner for procedure.      Rogene Houston, MD  Chronic Pain Fellow PGY-5  [employer]       Attending Note: 61 y.o. male here for new patient appointment. The primary encounter diagnosis was Lumbar radiculopathy. Diagnoses of Encounter for long-term use of opiate analgesic, Cervical radiculopathy, and DDD (degenerative disc disease), lumbar were also pertinent to this visit. I saw and evaluated the patient, and discussed plans and findings with the fellow. I agree with the fellow's findings and plan as documented in the fellow's note. Will restart lyrica as well as lidocaine ointment. Patient instructed regarding side effects and the need to avoid driving until they know how the medication changes affect them.     Essie Hart (Will) Johny Drilling, MD  Assistant Professor of Anesthesia and Pain Management   [employer] Decision Making (applicable to this visit in bold)    Number/complexity of problems addressed during the encounter  1 or more chronic illnesses with exacerbation, progression, or side effects of treatment (4)  2 or more stable illnesses (4)  1 undiagnosed new problem with uncertain prognosis (4)  1 acute illness with systemic symptoms (4)  1 acute uncomplicated injury (4)  1 or more chronic illnesses with severe exacerbation,  progression, or side effects of tx (5)  1 chronic illness that poses threat to life or bodily function (5)      Amount and complexity of data to be reviewed and analyzed (1/3 for Moderate (4), 2/3 for Extensive (5):  - Tests, documents, orders, or independent historian (s)  - Independent interpretation of tests  - Discussion of management or test interpretation with external provider    Risk of complications and/or morbidity or mortality of patient management  - Moderate (4) for prescription drug management  - Extensive (5) for drugs needing intensive monitoring for toxicity - opioids needing UDS and [name] review each visit that implies serious risk for respiratory depression and mortality.    2/3 of above to determine MDM level    Moderate  Extensive

## 2022-02-22 NOTE — Unmapped (Signed)
Called and rescheduled patient for colonoscopy on 6.1.23 at 10:30am w/ Dr. Katrinka Blazing. Went over prep instructions w/ patient. New letters sent to my chart. Patient verbalized understanding.

## 2022-02-28 ENCOUNTER — Telehealth: Payer: Self-pay | Admitting: Family Medicine

## 2022-02-28 NOTE — Telephone Encounter (Signed)
N/A unable to leave a message for patient to call back to schedule Medicare Annual Wellness Visit  ? ?Last AWV  01/03/17 ? ?Please schedule at anytime with Nurse Health Advisor if patient calls the office back.   ? ? ?Any questions, please call me at [phone]  ?

## 2022-03-08 NOTE — Unmapped (Addendum)
-----   Message from Essie Hart (Will) Johny Drilling, MD sent at 02/21/2022 12:15 PM EDT -----  [phone]) Caudal ESI  DX: (M54.16) Lumbar radiculopathy        Submit PA for [id number] via online to [employer]  Order ID: [id number]  In Progress          Per [employer]: Order ID: [id number]  Authorized  Approval Valid Through:03/21/2022 - 04/01/2022        OK to schedule at [facility] 03/21/22 to 03/31/22

## 2022-03-09 ENCOUNTER — Encounter

## 2022-03-10 NOTE — Unmapped (Signed)
Scheduled        Is patient having any of the following procedures? If YES, proceed to blood thinner question. If NO, proceed to diabetes question.  Required to hold blood thinners for:   - Lumbar Epidural   - Caudal Epidural   - Cervical Epidural   - Thoracic Epidural   - Selective Nerve Root Blocks (SNRB) / Transforaminal Epidural (TFESI)   - Stellate Ganglion Block   - Celiac Plexus Block   - Hypogastric Plexus Block   - Lumbar Sympathetic Block   - Ganglion Impar Block   - Cervical Medial Branch Block   - Cervical RFA   - Intercostal Nerve Block   - Intercostal RFA   - Trigeminal Nerve Blocks - For Dr. Garg ONLY   - Thoracic Medial Branch Block   - Thoracic RFA           Are you on any blood thinners? If answers yes, is there a clearance on file within last 12 months? If not, send clearance request to hold before scheduling.  No        Do you have diabetes?  No    If yes, do you check your blood sugars and are they controlled? - [If steroid injection is being given, sugar must be under 200 prior to procedure or will need to reschedule (Note: does not apply to RFA procedures or local only procedures where steroid is not used)]   not applicable      Are you taking oral steroids or have you had any other steroid injections in the last 2 weeks? (Note: Steroids being taken for chronic conditions are OK)  No      Do you have any current infection (I.E. Cold, Flu, COVID symptoms, UTI etc.)?  No  NOTE: If yes, must take FULL COURSE of antibiotics prescribed AND be symptom free for at least 7-10 days after last antibiotic dose before rescheduling.      Have you had any COVID vaccines or boosters in the last 10 days?  No      Driver needed for following procedures:   - ALL RFAs of LUMBAR, CERVICAL, THORACIC spine   - ANY/ALL CERVICAL Procedures   - Stellate Ganglion Block   - Lumbar Sympathetic Block   - Hypogastric Plexus Block   - Celiac Plexus Block   - Selective Nerve Root Block (SNRB) / Transforaminal ESI (TFESI)     - Hip Injection - PREFERRED   - LESI - Required for Dr. Nguyen ONLY

## 2022-03-15 NOTE — Unmapped (Signed)
Staff message to physician office, patient states he is in too much pain to have procedure scheduled for 03/17/22 and would like to reschedule

## 2022-03-15 NOTE — Unmapped (Signed)
Pt called the colo line and lm to cancel procedure with Dr. Katrinka BlazingSmith on 03/17/2022. Pt states that he is in a lot of pain and is having another procedure done. I canceled in Epic. Pt did not saying anything about rescheduling.

## 2022-03-17 ENCOUNTER — Ambulatory Visit: Payer: Medicare (Managed Care) | Attending: Gastroenterology

## 2022-03-22 ENCOUNTER — Inpatient Hospital Stay
Admit: 2022-03-22 | Discharge: 2022-03-22 | Disposition: A | Discharge status: Home / Self Care | Payer: Medicare (Managed Care)

## 2022-03-22 DIAGNOSIS — G8929 Other chronic pain: Secondary | ICD-10-CM

## 2022-03-22 MED ORDER — cyclobenzaprine (FLEXERIL) tablet 5 mg
10 | Freq: Once | ORAL | Status: AC
Start: 2022-03-22 — End: 2022-03-22
  Administered 2022-03-22: 11:00:00 5 mg via ORAL

## 2022-03-22 MED ORDER — ketorolac (TORADOL) injection 15 mg
15 | Freq: Once | INTRAMUSCULAR | Status: AC
Start: 2022-03-22 — End: 2022-03-22
  Administered 2022-03-22: 11:00:00 15 mg via INTRAMUSCULAR

## 2022-03-22 MED ORDER — acetaminophen (TYLENOL) tablet 975 mg
325 | Freq: Once | ORAL | Status: AC
Start: 2022-03-22 — End: 2022-03-22
  Administered 2022-03-22: 11:00:00 975 mg via ORAL

## 2022-03-22 MED ORDER — ketorolac (TORADOL) injection 15 mg
15 | Freq: Once | INTRAMUSCULAR | Status: AC
Start: 2022-03-22 — End: 2022-03-22

## 2022-03-22 MED FILL — TYLENOL 325 MG TABLET: 325 325 mg | ORAL | Qty: 3

## 2022-03-22 MED FILL — KETOROLAC 15 MG/ML INJECTION SOLUTION: 15 15 mg/mL | INTRAMUSCULAR | Qty: 1

## 2022-03-22 MED FILL — CYCLOBENZAPRINE 10 MG TABLET: 10 10 MG | ORAL | Qty: 1

## 2022-03-22 NOTE — Unmapped (Signed)
Back pain. Hx sciatica.

## 2022-03-22 NOTE — Unmapped (Signed)
ED Attending Attestation Note    Date of service:  03/22/2022    This patient was seen by the resident physician.  I have seen and examined the patient, agree with the workup, evaluation, management and diagnosis. The care plan has been discussed and I concur.     My assessment reveals a 61 y.o. male with a history of chronic neck and back pain status post cervical fusion in 2003 and lumbar fusion in the 1980s newly established with [facility] pain management (Dr. Johny Drilling) presenting today with concern for exacerbation of his left-sided body pain.  He denies any bowel/bladder issues and is feeling much better after receiving therapy in the ED.  On my assessment, the patient get up easily and walks unassisted.  He does have some difficulty walking on his toes, though states that this is chronic secondary to more left-sided pain in his leg.  He denies any trauma, no midline tenderness.

## 2022-03-22 NOTE — Unmapped (Signed)
[facility] were [facility] emergency department for neck pain.  Your symptoms are most likely caused by your known cervical radiculopathy.  Please continue to take all of your prescribed medications to help with your symptoms at home, including your Flexeril, ibuprofen, Lyrica, and lidocaine patches.    Please follow-up with the pain management specialist as scheduled.    You should return to the emergency department if you experience new weakness/numbness, high fevers, frequent vomiting, incontinence, or any other symptoms that concern you.

## 2022-03-22 NOTE — Unmapped (Signed)
Patient discharge reviewed and stable to lobby

## 2022-03-22 NOTE — Unmapped (Signed)
.  [facility] ED Note    Date of Service: 03/22/2022  Reason for Visit: Back Pain      Patient History     HPI  Mitchell Evans is a 61 y.o. male with a past medical history of hypertension, hyperlipidemia, cervical spinal stenosis who presents with chronic neck pain.  Patient reports that he is new to the area, and had previously received regularly scheduled epidural injections for his chronic neck pain.  He has an appointment at the pain management clinic next week, but has not yet been seen in their clinic.  He reports that his current symptoms are similar to those attributed to his chronic neck pain.  He reports stiffness that is primarily located in the left side of his neck, as well as shooting type pains down his left upper and lower extremities.  He denies any fevers, nausea, vomiting, as well as new numbness/weakness in any of his extremities.  He denies any saddle anesthesia, bowel/bladder incontinence, or any recent trauma to his head or neck.  He reports that he takes Flexeril, Motrin, and lidocaine patches at home to manage his symptoms.    Other than stated above, no additional associated symptoms or aggravating or alleviating factors are noted.    Past Medical History:   Diagnosis Date   ??? Essential hypertension    ??? Hepatitis C 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 08/16/2021    Needs confirmed   ??? Hyperlipidemia    ??? Low serum HDL 08/16/2021   ??? Normocytic anemia 11/09/2021   ??? Prediabetes 08/16/2021   ??? Spinal stenosis of cervical region      Past Surgical History:   Procedure Laterality Date   ??? CERVICAL FUSION     ??? KNEE SURGERY       Patient  reports that he has never smoked. He has never used smokeless tobacco. He reports current drug use. Drug: Marijuana. No history on file for alcohol use.  Discharge Medication List as of 03/22/2022  8:47 AM      CONTINUE these medications which have NOT CHANGED    Details   atorvastatin (LIPITOR) 20 MG tablet TAKE 1 TABLET DAILY, Normal, Disp-90 tablet, R-1      cholecalciferol,  vitamin D3, 125 mcg (5,000 unit) Tab Take by mouth., Historical Med      ibuprofen (MOTRIN) 800 MG tablet Take 1 tablet (800 mg total) by mouth every 8 hours as needed for Pain., Starting Mon 02/07/2022, Normal, Disp-30 tablet, R-0      lidocaine (LIDODERM) 5 % Place 1 patch onto the skin daily for 60 days. Apply patch for 12 hours and then remove patch and leave off for 12 hours., Starting Mon 02/21/2022, Until Fri 04/22/2022, Normal, Disp-30 patch, R-1      lisinopriL (PRINIVIL) 10 MG tablet TAKE 1 TABLET DAILY, Normal, Disp-90 tablet, R-1      multivitamin capsule Take 1 capsule by mouth daily., Historical Med      polyethylene glycol (GOLYTELY) 236-22.74-6.74 -5.86 gram solution Please follow directions for colonoscopy., Normal, Disp-4000 mL, R-0      pregabalin (LYRICA) 150 MG capsule Take 1 capsule (150 mg total) by mouth 2 times a day for 60 days., Starting Mon 02/21/2022, Until Fri 04/22/2022, Normal, Disp-60 capsule, R-1      sildenafiL (VIAGRA) 100 MG tablet Take 1 tablet (100 mg total) by mouth if needed for Erectile Dysfunction. USE [facility] [employer], Starting Mon 11/22/2021, Normal, Disp-30 tablet, R-5  Allergies:   Allergies as of 03/22/2022   ??? (No Known Allergies)       All nursing notes and triage notes were appropriately reviewed in the course of the creation of this note.     Review of Systems     A comprehensive Review of Systems was performed, and was negative unless otherwise noted in the HPI.    Physical Exam     Vitals:    03/22/22 0625 03/22/22 0806   BP: 100/75 135/84   BP Location: Right arm    Patient Position: Sitting    Pulse: 79 53   Resp: 16 16   Temp: 97.5 ??F (36.4 ??C)    TempSrc: Oral    SpO2: 100% 99%       General: Well-appearing. Not in acute distress.  ENT: Moist mucous membranes. No nasal drainage.  Eye: Non-erythematous conjunctiva. Extra-occular movements intact.  Neck: Supple. Normal ROM.  No midline tenderness to palpation.  Cardiovascular: Regular rate and rhythm. No murmur or  rubs.  Respiratory: Normal respiratory effort. Breath sounds clear to auscultation bilaterally.  Musculoskeletal: No long bone deformities. Moves all 4 extremities.  Abdominal: Soft. Non-distended. Non-tender to palpation.   Neuro: Alert and oriented. CN II-XII grossly intact. Strength 5/5 in all extremities. Sensation grossly intact in all extremities.     Diagnostic Studies     EKG:  No EKG Performed  Emergency Department Procedures   Procedures    ED Course and MDM     [name] is a 61 y.o. male with a history and presentation as described above in HPI.  The patient was evaluated by myself and the ED Attending Physician, Dr. Dorcas Mcmurray, and R4 Dr. Street Boyer. All management and disposition plans were discussed and agreed upon.    Briefly, patient is a 61 year old male with a past medical history of cervical spinal stenosis who presents with chronic neck pain.  Upon presentation to the emergency department, vital signs were within normal limits.  Patient has a reassuring physical examination without evidence of midline tenderness, or focal neurological deficits.  Low suspicion for traumatic injury given lack of recent trauma on history.  Low suspicion for infection, including spinal epidural abscess, spinal osteomyelitis, given lack of fevers or other infectious symptoms.  Patient reports significant improvement in his symptoms after receiving Flexeril, Tylenol, and Toradol here in the emergency department.  We will plan to discharge at this time with his already scheduled pain management follow-up.    Medical Decision Making  Cervicalgia: chronic illness or injury  Risk  OTC drugs.  Prescription drug management.            Medications received during this ED visit:  Medications   cyclobenzaprine (FLEXERIL) tablet 5 mg (5 mg Oral Given 03/22/22 0700)   acetaminophen (TYLENOL) tablet 975 mg (975 mg Oral Given 03/22/22 0700)   ketorolac (TORADOL) injection 15 mg (15 mg Intramuscular Given 03/22/22 0700)       At this  time, the patient was deemed appropriate for discharge. My customary discharge instructions, including strict return precautions for new or worsening symptoms concerning to the patient, were provided. All of patient's questions were answered satisfactorily, and he was subsequently sent home in stable condition.    Impression     1. Cervicalgia         Plan     1. The patient is to be discharged home in stable/improved condition.  2. Workup, treatment and diagnosis were discussed with the patient and/or  family members; the patient agrees to the plan and all questions were addressed and answered.  3. The patient is instructed to return to the emergency department should his symptoms worsen or any concern he believes warrants acute physician evaluation.    Hulda Marin, MD, PGY-1  [facility]    Critical Care Time (Attendings)        Hulda Marin, MD  Resident  03/22/22 [phone]

## 2022-03-28 ENCOUNTER — Ambulatory Visit: Admit: 2022-03-28 | Discharge: 2022-03-28 | Payer: Medicare (Managed Care)

## 2022-03-28 DIAGNOSIS — M5416 Radiculopathy, lumbar region: Secondary | ICD-10-CM

## 2022-03-28 MED ORDER — triamcinolone acetonide (KENALOG-40) injection 80 mg
40 | Freq: Once | INTRAMUSCULAR | Status: AC
Start: 2022-03-28 — End: 2022-03-28
  Administered 2022-03-28: 18:00:00 80 mg

## 2022-03-28 MED ORDER — sodium chloride 0.9% 2 mL
Freq: Once | INTRAMUSCULAR | Status: AC
Start: 2022-03-28 — End: 2022-03-28
  Administered 2022-03-28: 18:00:00 2 mL via INTRAMUSCULAR

## 2022-03-28 MED ORDER — OMNIPAQUE (iohexol) 240 mg iodine/mL 3 mL
240 | Freq: Once | INTRAVENOUS | Status: AC
Start: 2022-03-28 — End: 2022-03-28
  Administered 2022-03-28: 18:00:00 3 mL via INTRASPINAL

## 2022-03-28 MED ORDER — lidocaine (PF) (XYLOCAINE) 10 mg/mL (1 %) 5 mL
10 | Freq: Once | INTRAMUSCULAR | Status: AC
Start: 2022-03-28 — End: 2022-03-28
  Administered 2022-03-28: 18:00:00 5 mL via INTRAMUSCULAR

## 2022-03-28 NOTE — Unmapped (Signed)
PAIN MANAGEMENT PROCEDURE DISCHARGE INSTRUCTIONS      1. Keep injection /surgical site dry until the band-aid/dressing is removed. Please remove the band-aid dressing   from the injection/surgical site between the first twelve to twenty-four hours after the procedure has been performed        2. If the injection/surgical site is sore, you may apply an ice pack to that area for twenty minutes every two hours for the initial twenty four hours.  DO NOT APPLY HEAT to the affected area for 3 days.    3. Occasionally you may notice slight increase in your pain after the procedure.  This should start to improve within the next twenty-four to forty-eight hours. (RFA procedure may take 3- 4 weeks for improvement in pain)    4. Remember, it may take as long as 3-4 days before you notice a gradual improvement in your pain and/or other symptoms.     5. You may continue to take your pain medication as needed.      6. DO NOT stop taking any other medications previously prescribed.      7. Unless otherwise instructed, you may continue with your normal activities once you leave the office.       8. If you are currently going to physical therapy, continue to do so unless your doctor instructs you not to.      9. If you develop any other symptoms, such as fever, rash, unusual drainage from the site, severe headache or weakness,     Please contact our office.      10. Other: If you have been prescribed anticoagulation medication and stopped for this procedure, please follow up with your prescribing physician for directions on restarting your anticoagulation.  ______________________________________________________________________________________   _______________________________________________________________________________________      Some normal possible side effects of steroids are:   *Fluid retention                                                   *Increased blood sugar                          *Dull headache   *Increased  sweating                                           *Increased appetite                                *Mood swings   *Slight increase in blood pressure                   *Weight gain                                             *Flushing      Diabetics are recommended to have a plan with their primary care provider/endocrinologist for the increase in blood sugar.     If you have any questions or concerns please contact the office at [phone].  If it is after   business hours follow the prompts to the on-call physician.  Thank you.

## 2022-03-28 NOTE — Unmapped (Signed)
H and P reviewed from previous visit and no changes to patient's clinical presentation. Will proceed with procedure as planned.

## 2022-03-28 NOTE — Unmapped (Signed)
View : No data to display.                     View : No data to display.                      03/28/2022     1:18 PM 03/28/2022     1:22 PM   Pre Op Procedure   Pre Blood Pressure 113/53    Pre Respirations 16    Pre Heart Rate 73    Pre Temperature 97.8 ??F (36.6 ??C)    Pre SpO2 98 %    Pre Pain Score Eight    Pre Op Blood Pressure 113/53    Pre Op Respiration 16    Pre Op Pulse 73    Pre Op Temperature 97.8 ??F (36.6 ??C)    Pre Op Pain Score 98 %    NRS Score Pre Procedure Eight    Latex N    Betadine N    Drugs or Medications No    Hospitalization within last 2 weeks? No    Blood Thinner Prescribed No    Diagnosed with MRSA of Body or Spine?  No   History of passing out related to any procedure/surgery?  No   Been diagnosed with cancer  No   Treated with Chemotherapy or Radiation  No   Problem with Platelets  No   Bleeding Problems/Blood Platelets?  No   Problems with liver including hepatitis or cirrhosis?  No   Currently receiving kidney dialysis treatment?  No   Have you had oral steroids in the past month?  No   Have you had steroid injections in the past month?  No   Recent cough, cold, infections or antibiotics?  No   Pacemaker?  No   Defibrillator?  No   Have you ever had an organ transplant?  No   Diabetic?  No   Pregnant or may be pregnant?  No   Previous back surgery?  No   Previous neck surgery?  No   Numbness in legs?  Left;Yes   Tingling in Legs?  Left;Yes   Weakness in Legs?  Left;Yes   Increased Pain?  Yes   Low Back Pain?  Yes   Calf Pain?  Left;Yes         03/28/2022     1:56 PM   Universal Protocol   Procedure Caudal ESI   Verified Patient Name and Date of Birth Verbally   NPO: N/A   History & Physical/updated within 24 hours: Verified   Pre-anesthesia assessment: N/A   Correct side/site(s) marked: Verified   Nursing assessment: Verified   Blood products available: N/A   Correct diagnostic and radiology test results properly labeled: N/A   Is patient on oxygen (O2)? No   Patient Name and Date  of Birth: Yes   Agreement of procedure(s) to be performed: Yes   Complete and signed procedure consent form: Yes   Correct side/site marking(s) is marked and visible or documented on diagram: Yes   Correct patient position: Yes   Availability of correct implant: N/A   Diagnostic Imaging Studies properly labeled and displayed, verified by Patient Name/DOB and /or Physician: N/A   Preoperative antibiotic or fluids for irrigation purposes given: N/A   Safety precautions based on patient history or medication use: Yes   Organ Transplant: donor and recipient blood types verified N/A   Pre-incision time out done: Yes  03/28/2022     1:57 PM   Intra Op Procedure   Attending Surgeon Essie Hart (Will) Johny Drilling, MD   Assistant Surgeon Resident   Local Anesthesia: Yes   Specimen Removed No         03/28/2022     1:57 PM   Post Op Procedure   BP 125/72   Resp 16   Heart Rate 66   SpO2 98 %   Pain Score Zero   Patient/Caregiver verbalized understanding? Yes   Complications during stay? No   Fluro time in seconds 6   Contrast Used? Omnipaque   Cc's of Contrast given: 3   Discharge By Nilda Simmer, RT   Discharge Date 03/28/2022   Discharge Time 14:23   Discharge to: Home       PROCEDURE IN DETAIL:   Procedure: Caudal Epidural Steroid Injection Under Fluoroscopic Guidance    Attending: Lauris Chroman, MD  Assistant: Wonda Horner, MD    Procedure Note:   After reviewing patient's history and physical and discussing the planned procedure's risks, benefits, and alternatives with the patient (including but not limited to: bleeding, infection, nerve damage, worsening of pain, CSF leak, inability to perform injection, paralysis, seizures, and death), the patient agreed to proceed and the consent was obtained. The patient was brought to the fluoroscopy bed and placed in a prone position. The surgical site was prepped and draped in the usual sterile fashion. Using sterile technique and fluoroscopic guidance, the skin overlying the needle  trajectory over the caudal epidural space was anesthetized using 5mL of 1% lidocaine. Then a 22 gauge spinal needle was inserted into the sacral hiatus into the caudal epidural space with fluoroscopic guidance. Upon confirmation in good position, the stylet was removed and after negative aspiration, injection of contrast revealed good epidural and nerve root spread. A mixture of 4mL saline with 80mg  of triamcinolone was injected into the epidural space. Patient tolerated the procedure well and the bandage was applied. The patient was then transferred to the recovery area in stable condition.      The patient was given written discharge instructions.

## 2022-04-18 ENCOUNTER — Ambulatory Visit: Admit: 2022-04-18 | Discharge: 2022-04-18 | Payer: Medicare (Managed Care)

## 2022-04-18 DIAGNOSIS — M5416 Radiculopathy, lumbar region: Secondary | ICD-10-CM

## 2022-04-18 NOTE — Unmapped (Signed)
Chief Complaint   Patient presents with    Back Pain     Lower back         History of Present Illness  HPI  Mitchell Evans is a 61 y.o. male here for follow up for his left upper extremity pain. Referral from Lavell Islam MD (ED).      PMHX: HTN, sciatica, [name], anemia, cervial stenosis s/p C6-7 ACDF (2003), s/p lumbar laminectomy (1980s)     Low back pain started many years ago, had lower back surgery in the 1980s (laminectomy??). He has had multiple back injections from [place] from a pain clinic there which gave him varying degrees of relief, the longest being about 6 months maybe. Low back pain is 6/10 on NRS, at maximum is 10/10. Pain is sharp and stabbing in nature. Pain is referred to bilateral posterior thighs, past the knees, to the feet (left more than the right). The pain is constant. The pain is improved by injections and oral medications. The pain is worse with prolonged sitting and standing. No problems with ambulation but does use a cane intermittently for support.      Neck pain and bilateral upper extremity pain started around 2003. He had ACDF 2003 due to this. His pain persistent post-surgery but better. He has had one neck injection from [place] which helped. Neck pain is 10/10 on NRS. Pain is sharp and stabbing in nature. Pain is referred bilateral hands and fingers (left hurts more than the right). The pain is constant. The pain is improved by lidocaine patches, Biofreeze, compression stockings. The pain is worse with prolonged sitting and standing.     Last PT done years ago. Still continues to do stretching and home exercises. States the pain clinic in [place] started him on oxycodone 5 mg (?) for years TID and last took them in March 2023. He went to his PCP here in [place] to possibly continue oxycodone but informed to go to pain management instead. He just recently gotten his medical marijuana card. States he does not intend to use medical marijuana for longer periods, just for breakthrough  pain.      Denies cigarette smoking and alcohol use.     Recently seen at ED 4/24 then 4/29 due to L neck and UE pain and was given flexeril, motrin, toradol (effective), medrol dose (effective).  Was given lyrica 150 BID x 2 days and oxycodone x 2 days from ED visit on April 28.  Recently moved to [place] from [place], [place] for personal reasons and also to get medical marijuana card.      Current Medications  Biofreeze  Lidocaine patch     Previous Interventions:  03/28/2022 Caudal ESI gave him 100% relief for ongoing duration as of 04/18/2022.   01/07/2021; [facility] [place] (Left L4/5 LESI)  08/13/2020 Left L4-L5 TFESI  06/05/2020 S1 Lumbosacral Transforaminal Epidural Steroid Injection - Sub-Pedicular Approach with Fluoroscopic Guidance  S1 foramen  03/02/2020 Lumbosacral Transforaminal Epidural Steroid Injection - Sub-Pedicular Approach with Fluoroscopic Guidance Bilateral L4-5  11/06/2019 Lumbosacral Transforaminal Epidural Steroid Injection - Sub-Pedicular Approach with Fluoroscopic Guidance  Bilateral L4-L5  07/25/2019 Cervical Facet Joint Intra-Articular Injection with Fluoroscopic Guidance  C2-3  07/03/2019 Lumbosacral Transforaminal Epidural Steroid Injection - Sub-Pedicular Approach with Fluoroscopic Guidance  B L4-L5  12/11/2017 S1 Lumbosacral Transforaminal Epidural Steroid Injection - Sub-Pedicular Approach with Fluoroscopic Guidance  L  S1 foramen  01/09/2018 Lumbosacral Transforaminal Epidural Steroid Injection - Sub-Pedicular Approach with Fluoroscopic Guidance B  L4-L5      Past Medications:  OXYCODONE - Effective; Percocet- effective  CYCLOBENZAPRINE - Not Effective  GABAPENTIN - Not Effective and PREGABLIN - Effective  IBUPROFEN - Not Effective   Biofreeze, lidocaine patch     Past Modalities:  TENS:                                                              No  Physical Therapy within last 6 months:          No  Chiropractor:                                                    Yes (years ago)  Massage Therapy:                                          Yes  Psychotherapy:                                               Yes (Trying to find a new one since recent move)     Patient Complains of:  Uro-fecal Incontinence:                                   No  Weight Gain/Loss:                                          No  Fever/Chills:                                                    No  Weakness:                                                      No      The following portions of the patient's history were reviewed and updated as appropriate: allergies, current medications, past family history, past medical history, past social history, past surgical history and problem list.    Histories  He has a past medical history of Essential hypertension, Hepatitis C 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 (08/16/2021), Hyperlipidemia, Low serum HDL (08/16/2021), Normocytic anemia (11/09/2021), Prediabetes (08/16/2021), and Spinal stenosis of cervical region.    He has a past surgical history that includes Cervical fusion and Knee surgery.    His family history includes Alzheimer's disease in his father; Colon Cancer in his mother.    He reports that he has never smoked. He  has never used smokeless tobacco. He reports current drug use. Drug: Marijuana.    Allergies  Patient has no known allergies.    Medications  Outpatient Encounter Medications as of 04/18/2022   Medication Sig Dispense Refill    atorvastatin (LIPITOR) 20 MG tablet TAKE 1 TABLET DAILY 90 tablet 1    cholecalciferol, vitamin D3, 125 mcg (5,000 unit) Tab Take by mouth.      ibuprofen (MOTRIN) 800 MG tablet Take 1 tablet (800 mg total) by mouth every 8 hours as needed for Pain. 30 tablet 0    lidocaine (LIDODERM) 5 % Place 1 patch onto the skin daily for 60 days. Apply patch for 12 hours and then remove patch and leave off for 12 hours. 30 patch 1    lisinopriL (PRINIVIL) 10 MG tablet TAKE 1 TABLET DAILY 90 tablet 1    multivitamin capsule Take 1 capsule by  mouth daily.      polyethylene glycol (GOLYTELY) 236-22.74-6.74 -5.86 gram solution Please follow directions for colonoscopy. 4000 mL 0    pregabalin (LYRICA) 150 MG capsule Take 1 capsule (150 mg total) by mouth 2 times a day for 60 days. 60 capsule 1    sildenafiL (VIAGRA) 100 MG tablet Take 1 tablet (100 mg total) by mouth if needed for Erectile Dysfunction. USE GOOD RX 30 tablet 5    [EXPIRED] acetaminophen (TYLENOL) tablet 975 mg       [EXPIRED] cyclobenzaprine (FLEXERIL) tablet 5 mg       [EXPIRED] ketorolac (TORADOL) injection 15 mg       [EXPIRED] lidocaine (PF) (XYLOCAINE) 10 mg/mL (1 %) 5 mL       [EXPIRED] OMNIPAQUE (iohexol) 240 mg iodine/mL 3 mL       [EXPIRED] sodium chloride 0.9% 2 mL       [EXPIRED] triamcinolone acetonide (KENALOG-40) injection 80 mg       [DISCONTINUED] ketorolac (TORADOL) injection 15 mg        No facility-administered encounter medications on file as of 04/18/2022.        Review of Systems   Musculoskeletal:  Positive for back pain and myalgias.     Vitals  Blood pressure 155/77, pulse 67, temperature 97.6 F (36.4 C), resp. rate 16, height 5' 11 (1.803 m), weight 193 lb (87.5 kg), SpO2 99 %.    Physical Exam  Vitals reviewed.   Constitutional:       Appearance: He is well-developed.   HENT:      Head: Normocephalic and atraumatic.      Right Ear: External ear normal.      Left Ear: External ear normal.   Eyes:      General: Lids are normal.         Right eye: No discharge.         Left eye: No discharge.      Conjunctiva/sclera: Conjunctivae normal.      Pupils: Pupils are equal, round, and reactive to light.   Neck:      Thyroid: No thyromegaly.      Trachea: No tracheal deviation.   Cardiovascular:      Rate and Rhythm: Normal rate and regular rhythm.   Pulmonary:      Effort: Pulmonary effort is normal. No respiratory distress.      Breath sounds: Normal breath sounds.   Abdominal:      Palpations: Abdomen is soft.   Musculoskeletal:      Cervical back: Normal range of  motion.  Skin:     General: Skin is warm and dry.   Neurological:      Mental Status: He is alert and oriented to person, place, and time.   Psychiatric:         Behavior: Behavior normal. Behavior is cooperative.         Thought Content: Thought content normal.         Judgment: Judgment normal.      Neurologic Exam     Mental Status   Oriented to person, place, and time.     Cranial Nerves     CN III, IV, VI   Pupils are equal, round, and reactive to light.   Ortho Exam    Activities of Daily Living:  1. Activity Level - patient indicates as improved and performs independently.  2. Sleep Patterns - patient indicates as improved and performs independently.                                 - indicates an average of 6-8+ hours of sleep per night.  3. Motivation - patient indicates as improved and performs independently.  4. Functional Level - patient indicates as improved and performs independently.           (a) Bathing - patient indicates as improved and performs independently.           (b) Dressing - patient indicates as improved and performs independently.           (c) Transferring from bed/chair - patient indicates as improved and performs independently.           (d) Walking - patient indicates as improved and performs independently.           (e) Eating - patient indicates as improved and performs independently.           (f) Toilet Use - patient indicates as improved and performs independently.           (g) Personal Hygiene - patient indicates as improved and performs independently.  Notes:      Review of Lab Results  Lab Results   Component Value Date    WBC 11.0 (H) 11/08/2021    RBC 3.86 (L) 11/08/2021    HGB 12.8 (L) 11/08/2021    HCT 37.6 (L) 11/08/2021    MCV 97.6 11/08/2021    MCH 33.2 (H) 11/08/2021    MCHC 34.0 11/08/2021    RDW 13.0 11/08/2021    PLT 205 11/08/2021    MPV 10.0 11/08/2021    BUN 8 11/08/2021       [name]/[place] Documentation (since 04/19/2019)       None          Last Urine Drug  Screen    No lab values to display.        Pain Agreement -- Encounter Level:    Pain Agreement: None found at the encounter level.       Pain Agreement -- Patient Level:    Pain Agreement: None found at the patient level.         No results found for this or any previous visit.   I have completed the required [name] documentation for this patient on 04/18/2022.  Amy Nicely    Investigations Reviewed:   No new imaging      ASSESSMENT:  Lumbar radiculopathy  Cervical radiculopathy  Lumbar spondylosis  DDD lumbar spine  Post laminectomy syndrome     PLAN:  1. Responded very well to caudal ESI and no longer need any meds. Will repeat as needed    Follow up as needed.    Patient was seen by Essie Hart (Will) Johny Drilling, MD  Anesthesiology and Pain Medicine  [facility]

## 2022-05-02 MED ORDER — pregabalin (LYRICA) 150 MG capsule
150 | ORAL_CAPSULE | Freq: Two times a day (BID) | ORAL | 2 refills
Start: 2022-05-02 — End: 2022-06-15

## 2022-05-02 NOTE — Unmapped (Signed)
[name] was pulled  03/28/2022 02/21/2022 1   Pregabalin 150 Mg Capsule  60.00 30 [facility] [id number] [facility] ([id number]) 1 2.01 LME [facility]  02/21/2022 02/21/2022 1   Pregabalin 150 Mg Capsule  60.00 30 Si Cha [id number] [facility] ([id number]) 0 2.01 [facility]  02/12/2022 02/12/2022 1   Oxycodone Hcl (Ir) 5 Mg Tablet  8.00 2 Un Cen [id number] [facility] ([id number]) 0 30.00 [facility]  02/12/2022 02/12/2022 1   Pregabalin 150 Mg Capsule  4.00 2 Un Cen [id number] [facility] ([id number]) 0 2.01 LME [facility]    01/26/2022 Fidela JuneauErin Moushey, MD  05/09/2022 Fidela JuneauErin Moushey, MD

## 2022-05-09 ENCOUNTER — Ambulatory Visit: Payer: Medicare (Managed Care)

## 2022-05-19 MED ORDER — atorvastatin (LIPITOR) 20 MG tablet
20 | ORAL_TABLET | Freq: Every day | ORAL | 1 refills | Status: AC
Start: 2022-05-19 — End: ?

## 2022-05-19 MED ORDER — sildenafiL (VIAGRA) 100 MG tablet
100 | ORAL_TABLET | ORAL | 5 refills | 30.00000 days | Status: AC | PRN
Start: 2022-05-19 — End: 2022-08-04

## 2022-05-19 MED ORDER — lisinopriL (PRINIVIL) 10 MG tablet
10 | ORAL_TABLET | Freq: Every day | ORAL | 0 refills | 30.00000 days | Status: AC
Start: 2022-05-19 — End: 2022-08-04

## 2022-05-19 NOTE — Unmapped (Signed)
01/26/2022 Fidela JuneauErin Moushey, MD  05/30/2022 Fidela JuneauErin Moushey, MD

## 2022-05-19 NOTE — Unmapped (Signed)
01/26/2022 Erin Moushey, MD  05/30/2022 Erin Moushey, MD

## 2022-05-19 NOTE — Unmapped (Signed)
01/26/2022 [name], MD.  05/30/2022 [name], MD

## 2022-05-30 ENCOUNTER — Ambulatory Visit: Payer: Medicare (Managed Care)

## 2022-05-30 MED ORDER — polyethylene glycol (GOLYTELY) 236-22.74-6.74 -5.86 gram solution
236-22.74-6.74 | ORAL | 0 refills | 14.00000 days
Start: 2022-05-30 — End: 2022-08-04

## 2022-05-30 NOTE — Unmapped (Signed)
Patient is scheduled for a oa colonoscopy @ [facility] w/ Dr. Verl Bangs on 12/12. Pt instructed to arrive at 9:30 procedure is scheduled at 11:00. Prep instructions for golytely mailed to their verified address.     Rx e-scribed to [email or web address].

## 2022-05-30 NOTE — Unmapped (Signed)
Open Access Endoscopy Procedure History         Procedure: Colonoscopy    Date:09/27/22    Patient ID: Pt. Is a 61 y.o. year old male    Indication(s):  OA COLONOSCOPY    His primary care physician is [name], MD     Referring Provider: [name], MD    Prior colonoscopy? No    Type of sedation: Monitored Anesthesia Care (MAC)      Interpreter services needed: No    Family history of colonic polyps or cancer. No    Any limitations on blood transfusions? No    Can the patient give consent? Yes    Is the patient on anticoagulation or antiplatelet drugs? No    Prep given: Spilt dose GoLYTELY    Past Medical History:   Diagnosis Date    Essential hypertension     Hepatitis C 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 08/16/2021    Needs confirmed    Hyperlipidemia     Low serum HDL 08/16/2021    Normocytic anemia 11/09/2021    Prediabetes 08/16/2021    Spinal stenosis of cervical region      Current Medications as of 05/30/2022  4:04 PM       Outpatient Medications         Quantity Refills Start End    atorvastatin (LIPITOR) 20 MG tablet 90 tablet 1 05/19/2022     cholecalciferol, vitamin D3, 125 mcg (5,000 unit) Tab        ibuprofen (MOTRIN) 800 MG tablet 30 tablet 0 02/07/2022     lidocaine (LIDODERM) 5 % 30 patch 1 02/21/2022 04/22/2022    lisinopriL (PRINIVIL) 10 MG tablet 90 tablet 0 05/19/2022     multivitamin capsule        polyethylene glycol (GOLYTELY) 236-22.74-6.74 -5.86 gram solution 4000 mL 0 11/22/2021     polyethylene glycol (GOLYTELY) 236-22.74-6.74 -5.86 gram solution 4000 mL 0 05/30/2022     pregabalin (LYRICA) 150 MG capsule 60 capsule 2 05/02/2022 07/01/2022    sildenafiL (VIAGRA) 100 MG tablet 30 tablet 5 05/19/2022                   All Immunizations including Deferred   Administered Date(s) Administered    COVID-19, mRNA, Pfizer bivalent booster, tris-sucrose, age 7+ 07/17/2021    COVID-19, mRNA, Pfizer monovalent 12/25/2019, 01/22/2020, 07/14/2020, 01/14/2021    Influenza, quadrivalent, preservative-free 06/26/2018,  05/31/2019, 06/15/2020, 07/17/2021    Pneumococcal polysaccharide, 23-valent 06/16/2020    Zoster, recombinant 09/22/2020, 01/19/2021    tdap 11/14/2017     Past Surgical History:   Procedure Laterality Date    CERVICAL FUSION      KNEE SURGERY       No Known Allergies    Social History     Tobacco Use    Smoking status: Never    Smokeless tobacco: Never   Substance Use Topics    Alcohol use: Not on file        BMI: Estimated body mass index is 26.92 kg/m as calculated from the following:    Height as of 04/18/22: 5' 11 (1.803 m).    Weight as of 04/18/22: 193 lb (87.5 kg).      Peggye Pitt  05/30/2022  4:04 PM

## 2022-06-13 NOTE — Unmapped (Signed)
Called pt to reschedule the procedure ,left VM with direct number.

## 2022-06-15 ENCOUNTER — Ambulatory Visit: Admit: 2022-06-15 | Discharge: 2022-06-15 | Payer: Medicare (Managed Care)

## 2022-06-15 DIAGNOSIS — F39 Unspecified mood [affective] disorder: Secondary | ICD-10-CM

## 2022-06-15 MED ORDER — methylPREDNISolone (MEDROL, PAK,) 4 mg tablet
4 | ORAL | 0 refills
Start: 2022-06-15 — End: 2022-07-11

## 2022-06-15 NOTE — Unmapped (Signed)
Called pt again trying to reschedule his procedure ,left vm with direct number

## 2022-06-15 NOTE — Unmapped (Signed)
Pre-Visit Preventative Plannning  Lab Results   Component Value Date    HGBA1C 5.0 11/08/2021     Health Maintenance   Topic Date Due    Immunization: Influenza (MyChart) (1) 06/17/2022    Colorectal Cancer Screening (MyChart)  07/18/2022    Renal Function/GFR  11/08/2022    Alcohol Misuse Screening  11/08/2022    Diabetes Screening  11/08/2022    Annual Medicare Wellness Visit  11/08/2022    Comprehensive Physical Exam  11/08/2022    Depression Monitoring (PHQ-9)  06/16/2023    Lipid Panel  11/08/2026    Immunization: DTaP/Tdap/Td (2 - Td or Tdap) 11/15/2027    Hepatitis C Screening (MyChart)  Completed    HIV Screening  Completed    Immunization: Zoster  Completed    Immunization: Pneumococcal  Aged Out     Immunization History   Administered Date(s) Administered    COVID-19, mRNA, Pfizer bivalent booster, tris-sucrose, age 97+ 07/17/2021    COVID-19, mRNA, Pfizer monovalent 12/25/2019, 01/22/2020, 07/14/2020, 01/14/2021    Influenza, quadrivalent, preservative-free 06/26/2018, 05/31/2019, 06/15/2020, 07/17/2021    Pneumococcal polysaccharide, 23-valent 06/16/2020    Zoster, recombinant 09/22/2020, 01/19/2021    tdap 11/14/2017     CC:   Chief Complaint   Patient presents with    Sciatica     Follow up. Would like referral for psych.      HPI: See A&P for details of HPI    ROS: Please see HPI    Home Meds:  Current Outpatient Medications   Medication Sig    atorvastatin Take 1 tablet (20 mg total) by mouth daily.    cholecalciferol (vitamin D3) Take by mouth.    ibuprofen Take 1 tablet (800 mg total) by mouth every 8 hours as needed for Pain.    lisinopriL Take 1 tablet (10 mg total) by mouth daily.    multivitamin Take 1 capsule by mouth daily.    polyethylene glycol Please follow directions for colonoscopy.    polyethylene glycol Please take your prep according to the instructions outlined in your prep letter from [facility]. Your prep letter is located in [place] under communications.    pregabalin Take 1 capsule (150  mg total) by mouth 2 times a day for 60 days.    sildenafiL Take 1 tablet (100 mg total) by mouth if needed for Erectile Dysfunction. USE GOOD RX    lidocaine Place 1 patch onto the skin daily for 60 days. Apply patch for 12 hours and then remove patch and leave off for 12 hours.    methylPREDNISolone follow package directions     No current facility-administered medications for this visit.     PE:  BP 137/59 (BP Location: Right upper arm, Patient Position: Sitting, BP Cuff Size: Large)   Pulse 76   Resp 16   Ht 5' 11 (1.803 m)   Wt 193 lb (87.5 kg)   SpO2 95%   BMI 26.92 kg/m   Wt Readings from Last 3 Encounters:   06/15/22 193 lb (87.5 kg)   04/18/22 193 lb (87.5 kg)   02/21/22 193 lb (87.5 kg)     Today's body mass index is 26.92 kg/m.     General: Well appearing, well developed, well noursed. No acute distress.    Skin: No rashes or suspicious lesions  Cardiac: Regular rate and rhythm. No murmurs rubs or gallops.  Normal S1 and S2.    Respiratory: Clear to auscultation bilateral.  No respiratory distress.  No Tachypnea  Vascular: No edema  Psych: Alert and oriented.  Normal mood and affect    A/P: Mitchell Evans is a 61 y.o. male here for discussion of mood and sciatica.  Wants a second opinion from outside Ortho.  Referrals faxed    Mitchell Evans was seen today for sciatica.    Diagnoses and all orders for this visit:    Mood disorder (CMS-HCC) (Primary)  -     Therapy or counseling referral    Bilateral sciatica  -     Amb referral to Orthopedics  -     methylPREDNISolone (MEDROL, PAK,) 4 mg tablet; follow package directions    Mitchell Juneau, MD  [facility]

## 2022-06-15 NOTE — Unmapped (Signed)
Pt called the colo line to reschedule colo that was canceled due to provider schedule change. Pt was scheduled with Dr. Verl Bangs.

## 2022-06-16 MED ORDER — pregabalin (LYRICA) 150 MG capsule
150 | ORAL_CAPSULE | Freq: Two times a day (BID) | ORAL | 2 refills | 30.00000 days
Start: 2022-06-16 — End: 2022-07-17

## 2022-06-16 NOTE — Unmapped (Signed)
06/15/2022 Fidela JuneauErin Moushey, MD     Visit date not found    [name] pulled last refill 05/11/2022 for 30 days

## 2022-06-17 NOTE — Unmapped (Signed)
Scheduled with Dr. Verl Bangs  Procedure: colo  Diagnosis: screening  Date: 12.15.2023  Time: 9:05  Arrival: 7:30  Anesthesia Type: mac  Length of procedure: 30  Prep Type: mil/dul  Patient info sent: my chart  Pharmacy Rx electronically sent to: file  Acknowledge a driver needed: yes  Important Issues or Needs:

## 2022-06-23 NOTE — Unmapped (Signed)
06/15/2022 Fidela JuneauErin Moushey, MD.  Visit date not found

## 2022-06-24 MED ORDER — lidocaine (LIDODERM) 5 %
5 | MEDICATED_PATCH | TOPICAL | 1 refills | 30.00000 days
Start: 2022-06-24 — End: 2022-07-19

## 2022-06-27 ENCOUNTER — Ambulatory Visit: Payer: Medicare (Managed Care)

## 2022-06-30 ENCOUNTER — Inpatient Hospital Stay
Admit: 2022-06-30 | Discharge: 2022-06-30 | Disposition: A | Discharge status: Home / Self Care | Payer: Medicare (Managed Care)

## 2022-06-30 ENCOUNTER — Emergency Department: Admit: 2022-06-30 | Payer: Medicare (Managed Care)

## 2022-06-30 DIAGNOSIS — K5732 Diverticulitis of large intestine without perforation or abscess without bleeding: Secondary | ICD-10-CM

## 2022-06-30 LAB — URINALYSIS W/RFL TO MICROSCOPIC
Bilirubin, UA: NEGATIVE
Blood, UA: NEGATIVE
Glucose, UA: NEGATIVE mg/dL
Hyaline Casts, UA: 1 /LPF (ref 0–2)
Ketones, UA: NEGATIVE mg/dL
Nitrite, UA: NEGATIVE
Protein, UA: NEGATIVE mg/dL
RBC, UA: 2 /HPF (ref 0–3)
Specific Gravity, UA: 1.013 (ref 1.005–1.035)
Squam Epithel, UA: 2 /HPF (ref 0–5)
Urobilinogen, UA: 2 mg/dL (ref 0.2–1.9)
WBC, UA: 14 /HPF (ref 0–5)
pH, UA: 6 (ref 5.0–8.0)

## 2022-06-30 LAB — URINE CULTURE

## 2022-06-30 MED ORDER — AMOXicillin-clavulanate (AUGMENTIN) 875-125 mg per tablet
875-125 | ORAL_TABLET | Freq: Two times a day (BID) | ORAL | 0 refills | Status: AC
Start: 2022-06-30 — End: 2022-07-07

## 2022-06-30 NOTE — Unmapped (Signed)
Pt able to walk to restroom w/o complications

## 2022-06-30 NOTE — Unmapped (Signed)
Pt discharged to home in good condition. Discharge instructions provided to patient and explained. Patient verbalized understanding. Medications reviewed and all of patient's questions and concerns answered. Pt A&Ox4, no signs of acute distress noted. Skin pink warm and dry. Pt ambulated out of ED without assistance

## 2022-06-30 NOTE — Unmapped (Signed)
[facility] ED Note    06/30/2022    Patient History     HPI: Josemaria Brining is a 61 y.o. male who presents with Flank Pain (Right sided flank pain. No history. )  .  Patient describes fairly acute onset of right flank pain rating to the abdomen but not to the scrotum.  Single episode of nausea with vomiting at home.  Pain is significantly improved on arrival.  Patient denies dysuria or hematuria but does endorse a history of diverticulitis.  No recent fevers or chills.  No blood from above or below.       Past Medical History:   Diagnosis Date    Essential hypertension     Hepatitis C 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 08/16/2021    Needs confirmed    Hyperlipidemia     Low serum HDL 08/16/2021    Normocytic anemia 11/09/2021    Prediabetes 08/16/2021    Spinal stenosis of cervical region        Past Surgical History:   Procedure Laterality Date    CERVICAL FUSION      KNEE SURGERY          reports that he has never smoked. He has never used smokeless tobacco. He reports current drug use. Drug: Marijuana. No history on file for alcohol use.    Discharge Medication List as of 06/30/2022  8:43 AM        CONTINUE these medications which have NOT CHANGED    Details   atorvastatin (LIPITOR) 20 MG tablet Take 1 tablet (20 mg total) by mouth daily., Starting Thu 05/19/2022, Normal, Disp-90 tablet, R-1      cholecalciferol, vitamin D3, 125 mcg (5,000 unit) Tab Take by mouth., Historical Med      ibuprofen (MOTRIN) 800 MG tablet Take 1 tablet (800 mg total) by mouth every 8 hours as needed for Pain., Starting Mon 02/07/2022, Normal, Disp-30 tablet, R-0      lidocaine (LIDODERM) 5 % Place 1 patch onto the skin daily for 60 days. Apply patch for 12 hours and then remove patch and leave off for 12 hours., Starting Fri 06/24/2022, Until Tue 08/23/2022, Normal, Disp-30 patch, R-1      lisinopriL (PRINIVIL) 10 MG tablet Take 1 tablet (10 mg total) by mouth daily., Starting Thu 05/19/2022, Normal, Disp-90 tablet, R-0      methylPREDNISolone (MEDROL,  PAK,) 4 mg tablet follow package directions, Normal, Disp-21 each, R-0      multivitamin capsule Take 1 capsule by mouth daily., Historical Med      !! polyethylene glycol (GOLYTELY) 236-22.74-6.74 -5.86 gram solution Please follow directions for colonoscopy., Normal, Disp-4000 mL, R-0      !! polyethylene glycol (GOLYTELY) 236-22.74-6.74 -5.86 gram solution Please take your prep according to the instructions outlined in your prep letter from UC. Your prep letter is located in [place] under communications., Normal, Disp-4000 mL, R-0      pregabalin (LYRICA) 150 MG capsule Take 1 capsule (150 mg total) by mouth 2 times a day., Starting Thu 06/16/2022, Until Wed 09/14/2022, Normal, Disp-60 capsule, R-2      sildenafiL (VIAGRA) 100 MG tablet Take 1 tablet (100 mg total) by mouth if needed for Erectile Dysfunction. USE GOOD RX, Starting Thu 05/19/2022, Normal, Disp-30 tablet, R-5       !! - Potential duplicate medications found. Please discuss with provider.          Allergies:   Allergies as of 06/30/2022    (No Known Allergies)  Review of Systems     ROS: See HPI for pertinent positives  All other ROS were negative.    Physical Exam     ED Triage Vitals [06/30/22 0620]   Vital Signs Group      Temp 97.5 F (36.4 C)      Temp Source Oral      Heart Rate 61      Heart Rate Source Monitor      Resp 17      SpO2 98 %      BP 167/67      MAP (mmHg) 95      BP Method Automatic      BP Location Right upper arm      BP Cuff Size Regular      Patient Position Lying   SpO2 98 %   O2 Device None (Room air)     Vitals:    06/30/22 0700 06/30/22 0730 06/30/22 0800 06/30/22 0830   BP: 153/84 151/81 141/65 139/74   BP Location:       Patient Position:       BP Cuff Size:       Pulse:    57   Resp:    16   Temp:       TempSrc:       SpO2: 98% 99% 97% 97%      Temp Readings from Last 2 Encounters:   06/30/22 97.5 F (36.4 C) (Oral)   04/18/22 97.6 F (36.4 C)     BP Readings from Last 2 Encounters:   06/30/22 139/74   06/15/22  137/59     Pulse Readings from Last 2 Encounters:   06/30/22 57   06/15/22 76        Constitutional: Generally well-appearing, no acute distress, non-toxic appearance   Eyes:  Sclera anicteric, conjunctiva normal.  HEENT:  Atraumatic, external ears normal, oropharynx moist.   Neck: normal range of motion, supple.  Respiratory:  No respiratory distress, No excessory muscle use  Cardiovascular:  Regular rate, 2+ pulse = BL radial.  GI:  Soft, nondistended, no rebound, no guarding.  Mild left lower quadrant tenderness on examination.  No reproducible right CVA tenderness.  Musculoskeletal:  No edema, no deformities.   Skin:  No rash or nodules noted.   Neurologic:  Awake and alert.  Moves all four extremities.  Light touch intact.    Psychiatric:  Normal mood.  Behavior appropriate.      Diagnostic Studies     Labs:      Labs Reviewed   URINALYSIS W/RFL TO MICROSCOPIC - Abnormal; Notable for the following components:       Result Value    Clarity, UA Slightly-Cloudy (*)     Urobilinogen, UA 2.0 (*)     Leukocytes, UA Moderate (*)     WBC, UA 14 (*)     Bacteria, UA Rare (*)     Mucus, UA Present (*)     All other components within normal limits       Radiology:  CT ABDOMEN AND PELVIS WO IV CONTRAST  URINE CULTURE      EKG interpretation:, None     Emergency Department Procedures         ED Course and MDM     Tayon Parekh is a 61 y.o. male who presented to the emergency department with left-sided abdominal tenderness on examination although patient complains of right flank pain.  CT scan seems to reflect the  findings on exam with early uncomplicated diverticulitis for which patient will be treated.  He is noted to have some white blood cells in the urine possibly reactive due to proximity of bladder to involved section of sigmoid diverticula.  Urine culture will be sent and antibiotic change may be required if cultures return positive.  Discharge home with return precautions, recommendation to follow-up with primary  care in the next 1 to 2 weeks.          Medical Decision Making  Problems Addressed:  Diverticulitis of colon: complicated acute illness or injury    Amount and/or Complexity of Data Reviewed  Labs: ordered.  Radiology: ordered.    Risk  Prescription drug management.        Meds given in ED or prescribed for discharge:  Medications - No data to display    Clinical Impression:  1. Diverticulitis of colon        Patient Referred to:    [name]  [id number] [facility]  [address]  [place] [address]  [phone]    Schedule an appointment as soon as possible for a visit         Future Appointments   Date Time Provider Department Center   07/04/2022  3:00 PM Amy Ferdie [facility] PAIN [facility] [address]   07/06/2022  8:45 AM [name], MD [facility] [place]   09/30/2022  9:05 AM Oletta Darter Kirke Shaggy, MD [facility] ENDO [facility]       Discharge Medications:  Discharge Medication List as of 06/30/2022  8:43 AM        START taking these medications    Details   AMOXicillin-clavulanate (AUGMENTIN) 875-125 mg per tablet Take 1 tablet by mouth 2 times a day for 7 days., Starting Thu 06/30/2022, Until Thu 07/07/2022, Normal, Disp-14 tablet, R-0                   Critical Care Time (Attendings)        Sterling Mondo, MD  07/01/22 1409

## 2022-06-30 NOTE — Unmapped (Signed)
Presents with right sided back and flank pain. States he has been having intermittent back pain and woke up this morning with uncontrollable right sided flank pain.

## 2022-07-01 NOTE — Unmapped (Signed)
[facility] ED Post-Discharge Culture Follow-up:  Culture/Lab: urine  Final: normal flora  No further follow up.    Jaylee Freeze, PharmD  PGY-1 Pharmacy Resident   Ascom: [phone]

## 2022-07-04 ENCOUNTER — Ambulatory Visit: Payer: Medicare (Managed Care)

## 2022-07-06 ENCOUNTER — Ambulatory Visit: Admit: 2022-07-06 | Discharge: 2022-07-06 | Payer: Medicare (Managed Care)

## 2022-07-06 DIAGNOSIS — N400 Enlarged prostate without lower urinary tract symptoms: Secondary | ICD-10-CM

## 2022-07-06 MED ORDER — tadalafiL (CIALIS) 5 MG tablet
5 | ORAL_TABLET | Freq: Every day | ORAL | 3 refills | Status: AC
Start: 2022-07-06 — End: ?

## 2022-07-06 NOTE — Unmapped (Signed)
Pre-Visit Preventative Plannning  Lab Results   Component Value Date    HGBA1C 5.0 11/08/2021     Health Maintenance   Topic Date Due    Colorectal Cancer Screening (MyChart)  07/18/2022    Renal Function/GFR  11/08/2022    Alcohol Misuse Screening  11/08/2022    Diabetes Screening  11/08/2022    Annual Medicare Wellness Visit  11/08/2022    Comprehensive Physical Exam  11/08/2022    Depression Monitoring (PHQ-9)  07/07/2023    Lipid Panel  11/08/2026    Immunization: DTaP/Tdap/Td (2 - Td or Tdap) 11/15/2027    Hepatitis C Screening (MyChart)  Completed    HIV Screening  Completed    Immunization: Influenza (MyChart)  Completed    Immunization: Zoster  Completed    Immunization: Pneumococcal  Aged Out     Immunization History   Administered Date(s) Administered    COVID-19, mRNA, Pfizer bivalent booster, tris-sucrose, age 69+ 07/17/2021    COVID-19, mRNA, Pfizer monovalent 12/25/2019, 01/22/2020, 07/14/2020, 01/14/2021    Influenza, quadrivalent, preservative-free 06/26/2018, 05/31/2019, 06/15/2020, 07/17/2021    Influenza, unspecified 06/28/2022    Pneumococcal polysaccharide, 23-valent 06/16/2020    Zoster, recombinant 09/22/2020, 01/19/2021    tdap 11/14/2017     CC:   Chief Complaint   Patient presents with    hospital discharge followup      [place]- diverticulitis   Feeling better     HPI: See A&P for details of HPI    Found to have diverticulitis, feeling better.  No abdominal pain.      Reviewed diet recommendations,     ROS: Please see HPI    Home Meds:  Current Outpatient Medications   Medication Sig    AMOXicillin-clavulanate Take 1 tablet by mouth 2 times a day for 7 days.    atorvastatin Take 1 tablet (20 mg total) by mouth daily.    cholecalciferol (vitamin D3) Take by mouth.    ibuprofen Take 1 tablet (800 mg total) by mouth every 8 hours as needed for Pain.    lidocaine Place 1 patch onto the skin daily for 60 days. Apply patch for 12 hours and then remove patch and leave off for 12  hours.    lisinopriL Take 1 tablet (10 mg total) by mouth daily.    multivitamin Take 1 capsule by mouth daily.    polyethylene glycol Please follow directions for colonoscopy.    polyethylene glycol Please take your prep according to the instructions outlined in your prep letter from UC. Your prep letter is located in [place] under communications.    pregabalin Take 1 capsule (150 mg total) by mouth 2 times a day.    sildenafiL Take 1 tablet (100 mg total) by mouth if needed for Erectile Dysfunction. USE GOOD RX    methylPREDNISolone follow package directions    tadalafiL Take 1 tablet (5 mg total) by mouth daily. USE GOOD RX     No current facility-administered medications for this visit.     PE:  BP 126/65 (BP Location: Right upper arm, Patient Position: Sitting, BP Cuff Size: Large)   Pulse 71   Resp 16   Ht 5' 11 (1.803 m)   Wt 193 lb 6.4 oz (87.7 kg)   SpO2 99%   BMI 26.97 kg/m   Wt Readings from Last 3 Encounters:   07/06/22 193 lb 6.4 oz (87.7 kg)   06/15/22 193 lb (87.5 kg)   04/18/22 193 lb (87.5 kg)  Today's body mass index is 26.97 kg/m.     Gen: No acute distress, well developed, well nourished  HEENT: Normocephalic, atraumatic  CV: Hemodynamically stable  Resp: No tachypnea or increased WOB  Neuro: A&O x 3, answering questions appropriately  Psych: Normal mood and affect, behavior appropriate    A/P: Mitchell Evans is a 61 y.o. male here for hospital follow-up - discussed diet for diverticulitis.  Reviewed CT showing BPH, no symptoms, currently using Viagra for ED, change to Cialis to help both issues.      Mitchell Evans was seen today for hospital discharge followup .    Diagnoses and all orders for this visit:    Benign prostatic hyperplasia without lower urinary tract symptoms (Primary)  -     tadalafiL (CIALIS) 5 MG tablet; Take 1 tablet (5 mg total) by mouth daily. USE GOOD RX        [name], MD  [facility]

## 2022-07-06 NOTE — Unmapped (Signed)
Psyllium husk capsules (3 a day with a full glass of water)

## 2022-07-11 ENCOUNTER — Inpatient Hospital Stay
Admit: 2022-07-11 | Discharge: 2022-07-11 | Disposition: A | Discharge status: Home / Self Care | Payer: Medicare (Managed Care)

## 2022-07-11 DIAGNOSIS — R531 Weakness: Secondary | ICD-10-CM

## 2022-07-11 DIAGNOSIS — M5412 Radiculopathy, cervical region: Secondary | ICD-10-CM

## 2022-07-11 MED ORDER — methylPREDNISolone (MEDROL, PAK,) 4 mg tablet
4 | ORAL | 0 refills
Start: 2022-07-11 — End: 2022-08-04

## 2022-07-11 NOTE — Unmapped (Signed)
[facility] ED Note    Date of Service: 07/11/2022    Reason for Visit: Extremity Weakness      Patient History     HPI:  Mitchell Evans is a 61 y.o. male with history of known spinal stenosis who presents to the emergency department with complaints of left arm pain and weakness.  He has known spinal stenosis in his cervical spine, and always has some element of pain and tingling in his left arm.  Is gotten worse over the last 24 hours.  He has not recall an injury but notes that the tingling and pain starts in his wrist and hand and moves up towards his neck.  Hurts when he moves his neck in certain directions or turns in certain directions.  It does feel weak, slightly more weak than it had previously.  No fevers or chills.  No falls or injuries.  He also has left leg sciatic pain that is at baseline, unchanged.  He takes Lyrica with some relief..  There are no other alleviating or aggravating factors or associated symptoms other than that which has already been mentioned.       Past Medical History:   Diagnosis Date    Essential hypertension     Hepatitis C 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 08/16/2021    Needs confirmed    Hyperlipidemia     Low serum HDL 08/16/2021    Normocytic anemia 11/09/2021    Prediabetes 08/16/2021    Spinal stenosis of cervical region        Past Surgical History:   Procedure Laterality Date    CERVICAL FUSION      KNEE SURGERY         Mitchell Evans  reports that he has never smoked. He has never used smokeless tobacco. He reports current drug use. Drug: Marijuana. No history on file for alcohol use.    Discharge Medication List as of 07/11/2022  2:37 PM        CONTINUE these medications which have NOT CHANGED    Details   atorvastatin (LIPITOR) 20 MG tablet Take 1 tablet (20 mg total) by mouth daily., Starting Thu 05/19/2022, Normal, Disp-90 tablet, R-1      cholecalciferol, vitamin D3, 125 mcg (5,000 unit) Tab Take by mouth., Historical Med       ibuprofen (MOTRIN) 800 MG tablet Take 1 tablet (800 mg total) by mouth every 8 hours as needed for Pain., Starting Mon 02/07/2022, Normal, Disp-30 tablet, R-0      lidocaine (LIDODERM) 5 % Place 1 patch onto the skin daily for 60 days. Apply patch for 12 hours and then remove patch and leave off for 12 hours., Starting Fri 06/24/2022, Until Tue 08/23/2022, Normal, Disp-30 patch, R-1      lisinopriL (PRINIVIL) 10 MG tablet Take 1 tablet (10 mg total) by mouth daily., Starting Thu 05/19/2022, Normal, Disp-90 tablet, R-0      multivitamin capsule Take 1 capsule by mouth daily., Historical Med      pregabalin (LYRICA) 150 MG capsule Take 1 capsule (150 mg total) by mouth 2 times a day., Starting Thu 06/16/2022, Until Wed 09/14/2022, Normal, Disp-60 capsule, R-2      !! polyethylene glycol (GOLYTELY) 236-22.74-6.74 -5.86 gram solution Please follow directions for colonoscopy., Normal, Disp-4000 mL, R-0      !! polyethylene glycol (GOLYTELY) 236-22.74-6.74 -5.86 gram solution Please take your prep according to the instructions outlined in your prep letter from [facility]. Your prep letter is located in [place]  under communications., Normal, Disp-4000 mL, R-0      sildenafiL (VIAGRA) 100 MG tablet Take 1 tablet (100 mg total) by mouth if needed for Erectile Dysfunction. USE GOOD RX, Starting Thu 05/19/2022, Normal, Disp-30 tablet, R-5      tadalafiL (CIALIS) 5 MG tablet Take 1 tablet (5 mg total) by mouth daily. USE GOOD RX, Starting Wed 07/06/2022, Normal, Disp-90 tablet, R-3       !! - Potential duplicate medications found. Please discuss with provider.          Allergies:   Allergies as of 07/11/2022    (No Known Allergies)       Review of Systems     ROS:  Pertinent positive and negative findings as documented in the HPI otherwise all other systems were reviewed and were negative.    Physical Exam     ED Triage Vitals [07/11/22 1309]   Vital Signs Group      Temp 97.8 F (36.6 C)      Temp src       Heart Rate 69      Heart Rate  Source       Resp 15      SpO2       BP 148/66      MAP (mmHg)       BP Method       BP Location       BP Cuff Size       Patient Position    SpO2    O2 Device        General:  Alert, no distress    HEENT:  Head atraumatic, pupils equal round and reactive to light, extraocular movements intact, sclera clear, mucus membranes moist    Neck:  Supple, no LAD    Pulmonary:   CTAbilaterally, no wheezes, rhonchi, or rales    Cardiac:  RRR, normal S1S2, no MRG    Abdomen:  Soft, nontender, nondistended, no rebound and no guarding    Musculoskeletal:  No obvious deformities, no tenderness to palpation, positive Spurling sign on left, mildly decreased grip strength in left upper extremity, normal elbow flexion extension and shoulder strength with flexion extension, decreased subjective light touch sensation to his left upper extremity in all distributions, normal cap refill and pulses distally    Vascular:  2+ radial pulses bilaterally, 2+ dorsalis pedis pulses bilaterally    Skin:  Warm, dry, well perfused, no rashes    Neuro:  Alert and oriented x4, speech is clear and intact without dysarthria, gait intact    Psych:  Alert, Cooperative      Diagnostic Studies     Labs:    Labs Reviewed - No data to display    Radiology:    No orders to display       No orders to display         Emergency Department Procedures         ED Course and MDM     Mitchell Evans is a 61 y.o. male who presented to the emergency department with Extremity Weakness      This is a 61 year old male, with history of spinal stenosis and cervical radiculopathy as well as sciatica, chronic pain, who presents with worsening left arm pain.  On exam, there is some weakness in the left arm, mostly distal.  He has normal strength elsewhere throughout.  There are no cranial nerve abnormalities.  He does have a positive Spurling test.  Vital  signs here are reassuring.  He is afebrile.    I considered multiple etiologies, including worsening cervical radiculopathy,  stroke, musculoskeletal injury, muscle spasm, among others.    I have a very low suspicion for stroke based on his exam, the chronicity of his symptoms, and the localization to just the left upper extremity, though there is some distal weakness.  I think this is most likely cervical radiculopathy.  He agrees with this, and wants to get a referral to spine for recurrent injections which she has had before.  We did discuss steroid burst which she is in agreement with he is already on other medications that should help including Lyrica, he did not want to try pain medication.  Based on this, he is safe for discharge.  We discussed return precautions.  He is in agreement with this.    Medications - No data to display    [facility]      Critical Care Time (Attendings)          Leonetta Mcgivern  07/11/22 1808

## 2022-07-11 NOTE — Unmapped (Signed)
Patient complains of left arm and leg  numbness and tingling. Denies any headache or pain in head. Started around 1800 yesterday afternoon.

## 2022-07-11 NOTE — Unmapped (Signed)
Plan of care discussed with pt by Tx team. Pt given DCD instructions and paper work. Pt given opportunity to express any concerns/ questions. Pt verbalizes understanding denies any questions or concerns at this time. Pt encouraged to follow up w/ PCP and to return to ER for any concerns or emergent health care needs.

## 2022-07-11 NOTE — Unmapped (Addendum)
Patient resting in bed at this time

## 2022-07-12 NOTE — Unmapped (Signed)
LMTCB regarding recent imaging for neck. Where he had it done so we can obtain for appointment

## 2022-07-13 ENCOUNTER — Ambulatory Visit: Payer: Medicare (Managed Care)

## 2022-07-18 MED ORDER — pregabalin (LYRICA) 150 MG capsule
150 | ORAL_CAPSULE | Freq: Two times a day (BID) | ORAL | 2 refills
Start: 2022-07-18 — End: 2022-08-22

## 2022-07-18 NOTE — Unmapped (Signed)
[name] was pulled, last filled 06/17/22 30 day supply.   07/06/2022 [name], MD  Visit date not found

## 2022-07-19 ENCOUNTER — Inpatient Hospital Stay
Admit: 2022-07-19 | Discharge: 2022-07-19 | Disposition: A | Discharge status: Home / Self Care | Payer: Medicare (Managed Care)

## 2022-07-19 DIAGNOSIS — M5432 Sciatica, left side: Secondary | ICD-10-CM

## 2022-07-19 DIAGNOSIS — M5442 Lumbago with sciatica, left side: Secondary | ICD-10-CM

## 2022-07-19 MED ORDER — lidocaine (LIDODERM) 5 % 1 patch
5 | TOPICAL
Start: 2022-07-19 — End: 2022-07-19
  Administered 2022-07-19: 11:00:00 1 via TRANSDERMAL

## 2022-07-19 MED ORDER — ibuprofen (MOTRIN) tablet 800 mg
800 | Freq: Once | ORAL | Status: AC
Start: 2022-07-19 — End: 2022-07-19
  Administered 2022-07-19: 11:00:00 800 mg via ORAL

## 2022-07-19 MED ORDER — ibuprofen (MOTRIN) 800 MG tablet
800 | ORAL_TABLET | Freq: Three times a day (TID) | ORAL | 0 refills | 10.00000 days | PRN
Start: 2022-07-19 — End: 2022-09-16

## 2022-07-19 MED ORDER — lidocaine (LIDODERM) 5 %
5 | MEDICATED_PATCH | TOPICAL | 0 refills | 30.00000 days | Status: AC
Start: 2022-07-19 — End: ?

## 2022-07-19 MED FILL — LIDODERM 5 % TOPICAL PATCH: 5 5 % | TOPICAL | Qty: 1

## 2022-07-19 MED FILL — IBUPROFEN 800 MG TABLET: 800 800 MG | ORAL | Qty: 1

## 2022-07-19 NOTE — Unmapped (Signed)
Pt verbalizes understanding of discharge instructions and has been given the opportunity to ask questions.

## 2022-07-19 NOTE — Unmapped (Signed)
Pt states that his left leg has a tingling sensation, and is giving out.

## 2022-07-19 NOTE — Unmapped (Signed)
Pt presents to ED for CC of L leg pain. Pt states pain worsening over past couple days with shooting pain and giving out with use. Pt states hx of spinal stenosis with prior episodes in the past requiring IM injections to get relief. Pt Aox3, NAD, RUE. Pt remains ambulatory at this time.

## 2022-07-19 NOTE — Unmapped (Signed)
[facility] ED Note        Patient History     HPI:   This is a 61 year old male with a past medical history of hypertension, hyperlipidemia, prediabetes who presents to the emergency room today complaining of pain in his left lower back that radiates down his left leg.  States been bothering him for a few days but has had similar bouts in the past.  Denies any weakness denies any bowel or bladder incontinence.  Reports no swelling in his leg.  Denies any injury that he can think of but he does do a lot of lifting with work.       Past Medical History:   Diagnosis Date    Essential hypertension     Hepatitis C 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 08/16/2021    Needs confirmed    Hyperlipidemia     Low serum HDL 08/16/2021    Normocytic anemia 11/09/2021    Prediabetes 08/16/2021    Spinal stenosis of cervical region        Past Surgical History:   Procedure Laterality Date    CERVICAL FUSION      KNEE SURGERY          reports that he has never smoked. He has never used smokeless tobacco. He reports current drug use. Drug: Marijuana. He reports that he does not drink alcohol.    Discharge Medication List as of 07/19/2022  7:43 AM        CONTINUE these medications which have NOT CHANGED    Details   atorvastatin (LIPITOR) 20 MG tablet Take 1 tablet (20 mg total) by mouth daily., Starting Thu 05/19/2022, Normal, Disp-90 tablet, R-1      cholecalciferol, vitamin D3, 125 mcg (5,000 unit) Tab Take by mouth., Historical Med      lisinopriL (PRINIVIL) 10 MG tablet Take 1 tablet (10 mg total) by mouth daily., Starting Thu 05/19/2022, Normal, Disp-90 tablet, R-0      methylPREDNISolone (MEDROL, PAK,) 4 mg tablet follow package directions, Print, Disp-21 each, R-0      multivitamin capsule Take 1 capsule by mouth daily., Historical Med      !! polyethylene glycol (GOLYTELY) 236-22.74-6.74 -5.86 gram solution Please follow directions for colonoscopy., Normal, Disp-4000 mL, R-0      !! polyethylene  glycol (GOLYTELY) 236-22.74-6.74 -5.86 gram solution Please take your prep according to the instructions outlined in your prep letter from [facility]. Your prep letter is located in [place] under communications., Normal, Disp-4000 mL, R-0      pregabalin (LYRICA) 150 MG capsule Take 1 capsule (150 mg total) by mouth 2 times a day., Starting Mon 07/18/2022, Until Sun 10/16/2022, Normal, Disp-60 capsule, R-2      sildenafiL (VIAGRA) 100 MG tablet Take 1 tablet (100 mg total) by mouth if needed for Erectile Dysfunction. USE GOOD RX, Starting Thu 05/19/2022, Normal, Disp-30 tablet, R-5      tadalafiL (CIALIS) 5 MG tablet Take 1 tablet (5 mg total) by mouth daily. USE GOOD RX, Starting Wed 07/06/2022, Normal, Disp-90 tablet, R-3       !! - Potential duplicate medications found. Please discuss with provider.          Allergies:   Allergies as of 07/19/2022    (No Known Allergies)       Review of Systems     ROS:    all systems were reviewed with the patient/family and negative except as documented per HPI.      Physical Exam  Vitals:    07/19/22 0617 07/19/22 0705   BP: 130/82 142/75   BP Location: Right upper arm    Patient Position: Lying    BP Cuff Size: Regular    Pulse: 77    Resp: 18    Temp: 98 F (36.7 C)    TempSrc: Oral    SpO2: 99% 94%      Temp Readings from Last 1 Encounters:   07/19/22 98 F (36.7 C) (Oral)     BP Readings from Last 1 Encounters:   07/19/22 142/75     Pulse Readings from Last 1 Encounters:   07/19/22 77        Constitutional:   Well-nourished appearing middle-aged male no distress  Eyes:  Sclera anicteric.  HEENT:  Normocephalic, oropharynx moist.   Neck: normal range of motion, supple.  Respiratory:  Even and non-labored, no distress   Cardiovascular:  Extremities warm, well perfused  GI:  Soft, nondistended   Musculoskeletal:  No edema, no deformities. Inspection of the entire back reveals no abnormalities. Palpation of the spine reveals no step-offs or deformities. There is no TTP in the  midline. There is normal 5/5 strength noted with hip flexion/extension, knee flexion/extension, ankle dorsi/plantar flexion. Normal achilles and patellar reflexes bilaterally. Pt reports normal sensation to light touch in all areas of the bilateral feet.  Skin:  No rash, no lesions, no pallor   Neurologic:  Awake and alert. Moving all extremities purposefully and with grossly normal coordination.    Psychiatric:  Normal mood.  Behavior appropriate.      Diagnostic Studies     Labs:      Labs Reviewed - No data to display    Radiology:  None  No orders to display         Emergency Department Procedures         ED Course and MDM     This is a 61 year old male presents to the emergency room today complaining of left lower back pain with radiation down his left leg.  On arrival he is well-appearing he is in no distress Constellation of symptoms seems suggestive of sciatica.  No red flag features or other features suggestive of spinal cord compression, cauda equina, referred abdominal etiology of his pain or any other concerning findings.  Patient was given symptomatic relief with ibuprofen and a Lidoderm patch.  Was sent home with prescriptions for these and referral. The Patient/family was counseled regarding diagnosis, workup, and management provided in the ED. All of their questions were answered and they were educated about signs/symptoms which necessitate return visit to ED and understood these.      Medical Decision Making  Problems Addressed:  Sciatica of left side: complicated acute illness or injury    Risk  OTC drugs.  Prescription drug management.           Clinical Impression:  1. Sciatica of left side          Critical Care Time (Attendings)       Jilda Panda, MD     Jilda Panda, MD  07/22/22 1104

## 2022-07-20 ENCOUNTER — Ambulatory Visit: Payer: Medicare (Managed Care)

## 2022-07-22 NOTE — Unmapped (Signed)
2ND  message left to find out where patient had imaging

## 2022-07-27 ENCOUNTER — Ambulatory Visit: Admit: 2022-07-27 | Discharge: 2022-07-31 | Payer: Medicare (Managed Care)

## 2022-07-27 DIAGNOSIS — M5412 Radiculopathy, cervical region: Secondary | ICD-10-CM

## 2022-07-27 NOTE — Patient Instructions (Signed)
 An After Visit Summary was printed and given to the patient.  Xrays ordered.

## 2022-07-27 NOTE — Unmapped (Signed)
Neurosurgery Clinic Visit    Reason for Visit / Chief Complaint: No chief complaint on file.      History of Present Illness: This is a 61 y.o. patient with a history of neck and left arm pain.    ED notes reviewed.    HPI    61 year old male who complains of 2 to 5559-month history of neck pain radiating into his left upper extremity with weakness.  He states he had not anterior cervical discectomy with fusion about 20 years ago.  The pain radiates from his neck into the entire left upper extremity and hand.  He is complaining of numbness in all of his fingers.  He also complains of some weakness in his left arm.  No bowel or bladder dysfunction.  He has had no therapy.  He actually states the neck pain started after having lumbar ESI.    Past Medical, Surgical, Family and Social History:  Past Medical History:   Diagnosis Date    Essential hypertension     Hepatitis C 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 08/16/2021    Needs confirmed    Hyperlipidemia     Low serum HDL 08/16/2021    Normocytic anemia 11/09/2021    Prediabetes 08/16/2021    Spinal stenosis of cervical region        Past Surgical History:   Procedure Laterality Date    CERVICAL FUSION      KNEE SURGERY         Social History     Socioeconomic History    Marital status: Married     Spouse name: Not on file    Number of children: Not on file    Years of education: Not on file    Highest education level: Not on file   Occupational History    Not on file   Tobacco Use    Smoking status: Never    Smokeless tobacco: Never   Substance and Sexual Activity    Alcohol use: Never    Drug use: Yes     Types: Marijuana    Sexual activity: Not Currently   Other Topics Concern    Not on file   Social History Narrative    Married, step kids, retired      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 Patient has no additional relevant past medical, past surgical, family, or social history.    Medications:  Current Outpatient Medications   Medication Sig    atorvastatin Take 1 tablet (20 mg total) by mouth daily.    cholecalciferol (vitamin D3) Take by mouth.    ibuprofen Take 1 tablet (800 mg total) by mouth every 8 hours as needed for Pain.    lidocaine Place 1 patch onto the skin daily. Apply patch for 12 hours and then remove patch and leave off for 12 hours.    lisinopriL Take 1 tablet (10 mg total) by mouth daily.    methylPREDNISolone follow package directions    multivitamin Take 1  capsule by mouth daily.    polyethylene glycol Please follow directions for colonoscopy.    polyethylene glycol Please take your prep according to the instructions outlined in your prep letter from [facility]. Your prep letter is located in [place] under communications.    pregabalin Take 1 capsule (150 mg total) by mouth 2 times a day.    sildenafiL Take 1 tablet (100 mg total) by mouth if needed for Erectile Dysfunction. USE GOOD RX    tadalafiL Take 1 tablet (5 mg total) by mouth daily. USE GOOD RX     No current facility-administered medications for this visit.       Medications from paper and electronic chart reviewed.  Patient has no additional pertinent medications    Physical Exam:    Vitals:    07/27/22 1520   BP: (!) 85/57   BP Location: Right upper arm   Patient Position: Sitting   BP Cuff Size: Regular   Pulse: 65   Temp: 98.1 F (36.7 C)   TempSrc: Oral   SpO2: 99%   Weight: 196 lb 12.8 oz (89.3 kg)   Height: 5' 11 (1.803 m)       Physical Exam  Neck:      Comments: Full range of motion.  No spasms.  Nontender.  Neurological:      Mental Status: He is oriented to person, place, and time.      Gait: Gait is intact.      Deep  Tendon Reflexes:      Reflex Scores:       Tricep reflexes are 2+ on the right side and 2+ on the left side.       Bicep reflexes are 2+ on the right side and 2+ on the left side.       Brachioradialis reflexes are 2+ on the right side and 2+ on the left side.       Patellar reflexes are 2+ on the right side and 2+ on the left side.       Achilles reflexes are 2+ on the right side and 2+ on the left side.      Neurologic Exam     Mental Status   Oriented to person, place, and time.   Level of consciousness: alert    Motor Exam     Strength   Strength 5/5 except as noted.   Left deltoid: 4/5  Left biceps: 4/5  Left triceps: 4/5  Left wrist flexion: 4/5  Left wrist extension: 4/5  Left interossei: 4/5    Sensory Exam   Light touch normal.   Pinprick normal.     Gait, Coordination, and Reflexes     Gait  Gait: normal    Reflexes   Right brachioradialis: 2+  Left brachioradialis: 2+  Right biceps: 2+  Left biceps: 2+  Right triceps: 2+  Left triceps: 2+  Right patellar: 2+  Left patellar: 2+  Right achilles: 2+  Left achilles: 2+  Right grip: 2+  Left grip: 2+  Right Hoffman: absent  Left Hoffman: absent        Imaging reviewed:   CT Abdomen and Pelvis WO IV contrast  Narrative: EXAM: CT ABDOMEN AND PELVIS WO IV CONTRAST    INDICATION: Rt flank pain, eval ureteral stone    COMPARISON: None.    DATE: 06/30/2022    TECHNIQUE: CT of the abdomen and pelvis was performed without intravenous contrast. Axial images were obtained with coronal and sagittal reconstructions performed  at the scanner. The field-of-view was 36 cm.      FINDINGS:    There is no CT evidence of obstructive or nonobstructive nephrolithiasis.    No focal renal cortical lesions or hydronephrosis is seen on non-IV contrast-enhanced imaging.    The urinary bladder is normal.    The prostate gland is enlarged. The seminal vesicles are normal.    The adrenal glands are normal.    The liver and gallbladder are normal.    The stomach, duodenum, small bowel, and  appendix are normal. There is diverticulosis involving the colon. A focal segment in the sigmoid colon demonstrates mild inflammatory changes. This raises suspicion of a mild, uncomplicated acute diverticulitis.    Atherosclerotic calcifications involve the abdominal aorta and iliac arteries. No underlying aneurysmal dilation is seen. There is no evidence of retroperitoneal hematoma.    No measurable adenopathy is identified.    There is no evidence of pneumoperitoneum or ascites. No loculated collections are seen.    Abdominal wall soft tissues are normal.    No suspicious focal osseous lesions are seen. There is degenerative change at the L5-S1 disc space with underlying facet arthropathy.    The visualized lungs are clear. There is no pleural fluid.  Impression: IMPRESSION:    1.  No CT evidence of nephrolithiasis.    2.  Findings suspicious for mild, acute uncomplicated sigmoid diverticulitis.    Report Verified by: Fulton Reek, MD at 06/30/2022 7:46 AM EDT          Assessment  Nat Math 61 y.o. male with Diagnoses and all orders for this visit:    Cervical radiculopathy          1. Cervical radiculopathy              Plan  Cervical spine x-ray.  Physical therapy.  The plan at this point is for him to follow-up in 6 weeks.    Thank You    Gus Rankin, MD  07/27/2022  [facility]  [facility] NEUROSURGERY AT [facility]  [address]  [place] [address]  Dept: [phone]

## 2022-08-04 ENCOUNTER — Other Ambulatory Visit: Admit: 2022-08-04 | Payer: Medicare (Managed Care)

## 2022-08-04 ENCOUNTER — Ambulatory Visit: Admit: 2022-08-04 | Discharge: 2022-08-04 | Payer: Medicare (Managed Care)

## 2022-08-04 DIAGNOSIS — I10 Essential (primary) hypertension: Secondary | ICD-10-CM

## 2022-08-04 LAB — BASIC METABOLIC PANEL
Anion Gap: 6 mmol/L (ref 3–16)
BUN: 9 mg/dL (ref 7–25)
CO2: 29 mmol/L (ref 21–33)
Calcium: 8.6 mg/dL (ref 8.6–10.3)
Chloride: 107 mmol/L (ref 98–110)
Creatinine: 0.75 mg/dL (ref 0.60–1.30)
EGFR: >90
Glucose: 74 mg/dL (ref 70–100)
Osmolality, Calculated: 291 mOsm/kg (ref 278–305)
Potassium: 4.3 mmol/L (ref 3.5–5.3)
Sodium: 142 mmol/L (ref 133–146)

## 2022-08-04 MED ORDER — lisinopriL (PRINIVIL) 5 MG tablet
5 | ORAL_TABLET | Freq: Every day | ORAL | 0 refills | Status: AC
Start: 2022-08-04 — End: ?

## 2022-08-04 NOTE — Unmapped (Signed)
Pre-Visit Preventative Plannning  Lab Results   Component Value Date    HGBA1C 5.0 11/08/2021     Health Maintenance   Topic Date Due    Colorectal Cancer Screening (MyChart)  07/18/2022    Renal Function/GFR  11/08/2022    Diabetes Screening  11/08/2022    Annual Medicare Wellness Visit  11/08/2022    Comprehensive Physical Exam  11/08/2022    Depression Monitoring (PHQ-9)  07/07/2023    Lipid Panel  11/08/2026    Immunization: DTaP/Tdap/Td (2 - Td or Tdap) 11/15/2027    Hepatitis C Screening (MyChart)  Completed    HIV Screening  Completed    Immunization: Influenza (MyChart)  Completed    Immunization: Zoster  Completed    Immunization: Pneumococcal  Aged Out     Immunization History   Administered Date(s) Administered    COVID-19, mRNA, Pfizer bivalent booster, tris-sucrose, age 64+ 07/17/2021    COVID-19, mRNA, Pfizer monovalent 12/25/2019, 01/22/2020, 07/14/2020, 01/14/2021    Influenza, quadrivalent, preservative-free 06/26/2018, 05/31/2019, 06/15/2020, 07/17/2021    Influenza, unspecified 06/28/2022    Pneumococcal polysaccharide, 23-valent 06/16/2020    Zoster, recombinant 09/22/2020, 01/19/2021    tdap 11/14/2017     CC:   Chief Complaint   Patient presents with    Hypotension     Has been feeling a little light-head. Nasal congestion.      HPI: See A&P for details of HPI    ROS: Please see HPI    Home Meds:  Current Outpatient Medications   Medication Sig    atorvastatin Take 1 tablet (20 mg total) by mouth daily.    cholecalciferol (vitamin D3) Take by mouth.    lidocaine Place 1 patch onto the skin daily. Apply patch for 12 hours and then remove patch and leave off for 12 hours.    multivitamin Take 1 capsule by mouth daily.    pregabalin Take 1 capsule (150 mg total) by mouth 2 times a day.    tadalafiL Take 1 tablet (5 mg total) by mouth daily. USE GOOD RX    ibuprofen Take 1 tablet (800 mg total) by mouth every 8 hours as needed for Pain.    lisinopriL Take 1 tablet (5 mg total) by mouth daily. NEW  LOWER DOSAGE     No current facility-administered medications for this visit.     PE:  BP 113/51   Pulse 64   Ht 5' 11 (1.803 m)   Wt 196 lb (88.9 kg)   SpO2 95%   BMI 27.34 kg/m   Wt Readings from Last 3 Encounters:   08/04/22 196 lb (88.9 kg)   07/27/22 196 lb 12.8 oz (89.3 kg)   07/11/22 196 lb (88.9 kg)     Today's body mass index is 27.34 kg/m.     General: Well appearing, well developed, well noursed. No acute distress.    Skin: No rashes or suspicious lesions  Cardiac: Regular rate and rhythm. No murmurs rubs or gallops.  Normal S1 and S2.    Respiratory: Clear to auscultation bilateral.  No respiratory distress.  No Tachypnea  Psych: Alert and oriented.  Normal mood and affect    A/P: Mitchell Evans is a 61 y.o. male here for essential hypertension     Mitchell Evans was seen today for hypotension.    Diagnoses and all orders for this visit:    Essential hypertension (Primary)  Assessment & Plan:  Having some dizziness and lower BP readings.  Decrease Lisinopril.  Reviewed  BMP 1/23    Orders:  -     Basic metabolic panel; Future  -     lisinopriL (PRINIVIL) 5 MG tablet; Take 1 tablet (5 mg total) by mouth daily. NEW LOWER DOSAGE      Return in about 4 months (around 12/05/2022) for In Office Visit - med check.    Mitchell Juneau, MD  [facility]

## 2022-08-04 NOTE — Unmapped (Signed)
Having some dizziness and lower BP readings.  Decrease Lisinopril.  Reviewed BMP 1/23

## 2022-08-10 ENCOUNTER — Inpatient Hospital Stay
Admit: 2022-08-10 | Discharge: 2022-08-10 | Disposition: A | Discharge status: Home / Self Care | Payer: Medicare (Managed Care)

## 2022-08-10 DIAGNOSIS — M5442 Lumbago with sciatica, left side: Secondary | ICD-10-CM

## 2022-08-10 DIAGNOSIS — M5432 Sciatica, left side: Secondary | ICD-10-CM

## 2022-08-10 MED ORDER — ketorolac (TORADOL) injection 15 mg
15 | Freq: Once | INTRAMUSCULAR | Status: AC
Start: 2022-08-10 — End: 2022-08-10
  Administered 2022-08-10: 20:00:00 15 mg via INTRAMUSCULAR

## 2022-08-10 MED ORDER — predniSONE (DELTASONE) 10 MG tablet
10 | ORAL_TABLET | ORAL | 0 refills | 30.00000 days
Start: 2022-08-10 — End: 2022-09-16

## 2022-08-10 MED FILL — KETOROLAC 15 MG/ML INJECTION SOLUTION: 15 15 mg/mL | INTRAMUSCULAR | Qty: 1

## 2022-08-10 NOTE — Unmapped (Signed)
[facility] called [phone]), says that pt is non-adherent to one or more meds, can call Ph# above and ref # is [id number].

## 2022-08-10 NOTE — Unmapped (Signed)
Pt presents ambulatory to ED c/o left leg pain after running from a dog.

## 2022-08-10 NOTE — Unmapped (Signed)
Noted and not calling at this time.  BP controlled    Fidela JuneauErin Brilyn Tuller, MD  [facility]

## 2022-08-10 NOTE — Unmapped (Signed)
[facility] ED Note    Date of service:  08/10/2022    Reason for Visit: Leg Pain      Patient History     HPI:  Mitchell Evans is a 61 y.o. male with a significant PMH of hypertension, spinal stenosis, who presents to the Emergency Department with a chief complaint of -leg pain.  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  This seems to have exacerbated his sciatic symptoms with tingling radiating down the lateral left thigh and pain in the low back and upper buttock.  6/10 sharp constant worse with bearing weight better at rest.  Denies any loss control of bowel or bladder, saddle paresthesias.      The history is provided by the patient.     Past Medical History:   Diagnosis Date    Essential hypertension     Hepatitis C 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 08/16/2021    Needs confirmed    Hyperlipidemia     Low serum HDL 08/16/2021    Normocytic anemia 11/09/2021    Prediabetes 08/16/2021    Spinal stenosis of cervical region        Past Surgical History:   Procedure Laterality Date    CERVICAL FUSION      KNEE SURGERY         Social History     Tobacco Use   Smoking Status Never   Smokeless Tobacco Never       Social History     Substance and Sexual Activity   Alcohol Use Never       Social History     Substance and Sexual Activity   Drug Use Yes    Types: Marijuana       Previous Medications    ATORVASTATIN (LIPITOR) 20 MG TABLET    Take 1 tablet (20 mg total) by mouth daily.    CHOLECALCIFEROL, VITAMIN D3, 125 MCG (5,000 UNIT) TAB    Take by mouth.    IBUPROFEN (MOTRIN) 800 MG TABLET    Take 1 tablet (800 mg total) by mouth every 8 hours as needed for Pain.    LIDOCAINE (LIDODERM) 5 %    Place 1 patch onto the skin daily. Apply patch for 12 hours and then remove patch and leave off for 12 hours.    LISINOPRIL (PRINIVIL) 5 MG TABLET     Take 1 tablet (5 mg total) by mouth daily. NEW LOWER DOSAGE    MULTIVITAMIN CAPSULE    Take 1 capsule by mouth daily.    PREGABALIN (LYRICA) 150 MG CAPSULE    Take 1 capsule (150 mg total) by mouth 2 times a day.    TADALAFIL (CIALIS) 5 MG TABLET    Take 1 tablet (5 mg total) by mouth daily. USE GOOD RX       Allergies:   Allergies as of 08/10/2022    (No Known Allergies)       Review of Systems     Review of Systems   Constitutional:  Negative for chills and fever.   HENT:  Negative for facial swelling.    Respiratory:  Negative for shortness of breath.    Cardiovascular:  Negative for chest pain.   Gastrointestinal:  Negative for abdominal pain, nausea and vomiting.   Musculoskeletal:  Negative for arthralgias.   Skin:  Negative for rash.  Neurological:  Negative for dizziness and weakness.   Hematological:  Negative for adenopathy.   Psychiatric/Behavioral:  Negative for hallucinations.    All other systems reviewed and are negative.        Physical Exam     ED Triage Vitals [08/10/22 1325]   Vital Signs Group      Temp 98.7 F (37.1 C)      Temp Source Oral      Heart Rate 64      Heart Rate Source       Resp 18      SpO2 98 %      BP 141/67      MAP (mmHg) 82      BP Method       BP Location       BP Cuff Size       Patient Position    SpO2 98 %   O2 Device        Physical Exam  Vitals and nursing note reviewed.   Constitutional:       General: He is not in acute distress.     Appearance: Normal appearance. He is not ill-appearing.   HENT:      Head: Normocephalic and atraumatic.      Right Ear: External ear normal.      Left Ear: External ear normal.   Eyes:      Conjunctiva/sclera: Conjunctivae normal.      Pupils: Pupils are equal, round, and reactive to light.   Cardiovascular:      Rate and Rhythm: Normal rate and regular rhythm.      Pulses: Normal pulses.      Heart sounds: Normal heart sounds.   Pulmonary:      Effort: Pulmonary effort is normal.      Breath sounds: Normal breath sounds.    Abdominal:      General: Abdomen is flat.      Palpations: Abdomen is soft.   Musculoskeletal:         General: No swelling or deformity. Normal range of motion.      Cervical back: Normal range of motion. No rigidity.      Lumbar back: No bony tenderness.      Right lower leg: No bony tenderness. No edema.      Left lower leg: No bony tenderness. No edema.   Lymphadenopathy:      Cervical: No cervical adenopathy.   Skin:     General: Skin is warm and dry.      Findings: No rash.   Neurological:      General: No focal deficit present.      Mental Status: He is alert and oriented to person, place, and time.      Cranial Nerves: No cranial nerve deficit.      Sensory: No sensory deficit.      Motor: No weakness.      Coordination: Coordination normal.      Gait: Gait normal.   Psychiatric:         Mood and Affect: Mood normal.         Behavior: Behavior normal.         Thought Content: Thought content normal.         Judgment: Judgment normal.     Emergency Department Procedures     Procedures    ED Course and MDM     [name] is a 61 y.o. male who presented to the  emergency department with Leg Pain         ED Course and MDM     RECENT VITALS:  BP 141/67   Pulse 64   Temp 98.7 F (37.1 C) (Oral)   Resp 18   Ht 5' 11 (1.803 m)   Wt 196 lb (88.9 kg)   SpO2 98%   BMI 27.34 kg/m      ED Triage Vitals [08/10/22 1325]   Vital Signs Group      Temp 98.7 F (37.1 C)      Temp Source Oral      Heart Rate 64      Heart Rate Source       Resp 18      SpO2 98 %      BP 141/67      MAP (mmHg) 82      BP Method       BP Location       BP Cuff Size       Patient Position    SpO2 98 %   O2 Device      Vitals:    08/10/22 1325   BP: 141/67   Pulse: 64   Resp: 18   Temp: 98.7 F (37.1 C)   TempSrc: Oral   SpO2: 98%   Weight: 196 lb (88.9 kg)   Height: 5' 11 (1.803 m)      Temp Readings from Last 2 Encounters:   08/10/22 98.7 F (37.1 C) (Oral)   07/27/22 98.1 F (36.7 C) (Oral)     BP Readings from Last 8  Encounters:   08/10/22 141/67   08/04/22 113/51   07/27/22 (!) 85/57   07/19/22 142/75   07/11/22 148/80   07/06/22 126/65   06/30/22 139/74   06/15/22 137/59     Pulse Readings from Last 2 Encounters:   08/10/22 64   08/04/22 64     Wt Readings from Last 6 Encounters:   08/10/22 196 lb (88.9 kg)   08/04/22 196 lb (88.9 kg)   07/27/22 196 lb 12.8 oz (89.3 kg)   07/11/22 196 lb (88.9 kg)   07/06/22 193 lb 6.4 oz (87.7 kg)   06/15/22 193 lb (87.5 kg)       RADIOLOGY:  No orders to display       LABS:   Labs Reviewed - No data to display    MEDS:  Medications   ketorolac (TORADOL) injection 15 mg (15 mg Intramuscular Given 08/10/22 1550)     PROCEDURES: N/A    CONSULTS:  None    MEDICAL DECISION MAKING / ED COURSE:      Briefly, this is a 61 y.o. male with a past history of see above presents with sciatica    Differential diagnosis at this time includes, but is not limited to: Sciatica    Patient's previous charts, labs and imaging were reviewed.     Patient presents with worsening sciatic symptoms after he was running from a dog earlier today.  He was able to evade the dog without injury, no falls or trauma.  His exam is reassuring without acute weakness.  He has no symptoms concerning for acute cauda equina or severe nerve root compression that would require MRI imaging.  We suggested Toradol and prednisone taper.  Work note for 3 days follow-up with his neurosurgeon that he already sees for his spinal stenosis.  Discussed return precautions.      This patient was seen independently  by myself per our advanced practice provider departmental protocol and, therefore, no physician co-signature is required.     Specific education and instructions were provided to the patient who expressed understanding. Return precautions were provided in detail.    This note was created using dictation software and may, as a result, contain unintended grammatical and spelling errors.       The patient tolerated their visit well.  They  were seen and evaluated by the attending physician who agreed with the assessment and plan.  The patient and / or the family were informed of the results of any tests, a time was given to answer questions, a plan was proposed and they agreed with plan.      DIAGNOSIS:  1. Sciatica of left side        Future Appointments   Date Time Provider Department Center   09/14/2022 10:30 AM Gus Rankin, MD [facility]   09/30/2022  9:05 AM Oletta Darter Kirke Shaggy, MD [facility]   12/05/2022  2:15 PM [name], MD [facility] [place]       PATIENT REFERRED TO:  Gus Rankin, MD  [id number] [facility]  [address]  [place] [address]  [phone]    Go to   If symptoms worsen      DISCHARGE MEDICATIONS:  New Prescriptions    PREDNISONE (DELTASONE) 10 MG TABLET    Take 4 tabs oral daily x 3 days, then take 3 tab daily x 3 days, then take 2 tabs daily x 3 days, then one tab a day x 3 days.         Medical Decision Making  Problems Addressed:  Sciatica of left side: complicated acute illness or injury    Risk  Prescription drug management.        Critical Care Time (Attendings)            [place] Jnae [place], [place]  08/10/22 1630

## 2022-08-18 MED ORDER — atorvastatin (LIPITOR) 20 MG tablet
20 | ORAL_TABLET | Freq: Every day | ORAL | 1 refills | Status: AC
Start: 2022-08-18 — End: ?

## 2022-08-18 NOTE — Unmapped (Signed)
08/04/2022 [name], MD     12/05/2022 [name], MD

## 2022-08-23 MED ORDER — pregabalin (LYRICA) 150 MG capsule
150 | ORAL_CAPSULE | Freq: Two times a day (BID) | ORAL | 2 refills | 30.00000 days
Start: 2022-08-23 — End: 2022-09-29

## 2022-08-23 NOTE — Unmapped (Signed)
[name] was pulled, last filled 07/18/22 30 day supply.  08/04/2022 Fidela JuneauErin Moushey, MD  12/05/2022 Fidela JuneauErin Moushey, MD

## 2022-09-14 ENCOUNTER — Ambulatory Visit: Payer: Medicare (Managed Care)

## 2022-09-16 ENCOUNTER — Inpatient Hospital Stay: Admit: 2022-09-16 | Payer: Medicare (Managed Care)

## 2022-09-16 ENCOUNTER — Ambulatory Visit: Admit: 2022-09-16 | Discharge: 2022-09-20 | Payer: Medicare (Managed Care)

## 2022-09-16 DIAGNOSIS — M5412 Radiculopathy, cervical region: Secondary | ICD-10-CM

## 2022-09-16 NOTE — Unmapped (Addendum)
An After Visit Summary was printed and given to the patient.    MRI has been ordered. Please call [phone] to schedule appointment.     Follow up after MRI

## 2022-09-16 NOTE — Unmapped (Signed)
OFFICE CONSULTATION:  09/16/2022  PATIENT: Mitchell Evans   MRN: [id number]   DOB: 1961/04/07   NEUROSURGEON:   REFERRING: Referral, Self   PRIMARY: [name], MD     REASON FOR VISIT: left arm weakness    HISTORY & PHYSICAL  Mitchell Evans is a 61 y.o. male who presents to the clinic for follow-up after completion of his x-ray.  He reports continued worsening of his neck pain as well as his left upper extremity weakness.  Had a previous ACDF about 20 years ago.  Cervical x-ray shows spondylolisthesis at the level above the fusion.    07/27/2022 - Seen by Dr. Richardson Dopp -presented to clinic with complaints of 2 to 48-month history of neck pain radiating down the left arm extremity with weakness.  He had a previous ACDF about 20 years ago.  Pain radiates from his neck into his left upper extremity and hand.  He reports numbness in all his fingers.  He reports no urinary or bowel incontinence.  Recommended cervical x-rays as well as physical therapy.        PAST MEDICAL HISTORY:  Past Medical History:   Diagnosis Date    Essential hypertension     Hepatitis C 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 08/16/2021    Needs confirmed    Hyperlipidemia     Low serum HDL 08/16/2021    Normocytic anemia 11/09/2021    Prediabetes 08/16/2021    Spinal stenosis of cervical region        CURRENT MEDICATIONS:     Current Outpatient Medications:     atorvastatin (LIPITOR) 20 MG tablet, Take 1 tablet (20 mg total) by mouth daily., Disp: 90 tablet, Rfl: 1    cholecalciferol, vitamin D3, 125 mcg (5,000 unit) Tab, Take by mouth., Disp: , Rfl:     ibuprofen (MOTRIN) 800 MG tablet, Take 1 tablet (800 mg total) by mouth every 8 hours as needed for Pain., Disp: 30 tablet, Rfl: 0    lidocaine (LIDODERM) 5 %, Place 1 patch onto the skin daily. Apply patch for 12 hours and then remove patch and leave off for 12 hours., Disp: 30 patch, Rfl: 0    lisinopriL (PRINIVIL) 5 MG tablet, Take 1 tablet (5 mg total) by mouth daily. NEW LOWER DOSAGE, Disp: 90 tablet, Rfl: 0     multivitamin capsule, Take 1 capsule by mouth daily., Disp: , Rfl:     pregabalin (LYRICA) 150 MG capsule, Take 1 capsule (150 mg total) by mouth 2 times a day., Disp: 60 capsule, Rfl: 2    tadalafiL (CIALIS) 5 MG tablet, Take 1 tablet (5 mg total) by mouth daily. USE GOOD RX, Disp: 90 tablet, Rfl: 3    predniSONE (DELTASONE) 10 MG tablet, Take 4 tabs oral daily x 3 days, then take 3 tab daily x 3 days, then take 2 tabs daily x 3 days, then one tab a day x 3 days., Disp: 30 tablet, Rfl: 0    SURGICAL HISTORY:  Past Surgical History:   Procedure Laterality Date    CERVICAL FUSION      KNEE SURGERY         FAMILY HISTORY:  Family History   Problem Relation Age of Onset    Colon Cancer Mother     Alzheimer's disease Father        SOCIAL HISTORY:  Social History     Socioeconomic History    Marital status: Married     Spouse name: Not on file  Number of children: Not on file    Years of education: Not on file    Highest education level: Not on file   Occupational History    Not on file   Tobacco Use    Smoking status: Never    Smokeless tobacco: Never   Substance and Sexual Activity    Alcohol use: Never    Drug use: Yes     Types: Marijuana    Sexual activity: Not Currently   Other Topics Concern    Not on file   Social History Narrative    Married, step kids, retired      International aid/development worker of Psychologist, prison and probation services Strain: Not on file   Food Insecurity: No Food Insecurity (09/15/2022)    Yearly Questionnaire     Do you need any assistance with obtaining housing, meals, medication, transportation or medical equipment?: No     Assistance needed for:: Transportation   Transportation Needs: No Transportation Needs (09/15/2022)    Yearly Questionnaire     Do you need any assistance with obtaining housing, meals, medication, transportation or medical equipment?: No     Assistance needed for:: Transportation   Recent Concern: Transportation Needs - Unmet Transportation Needs (07/25/2022)    Yearly Questionnaire      Do you need any assistance with obtaining housing, meals, medication, transportation or medical equipment?: Yes     Assistance needed for:: Transportation   Physical Activity: Not on file   Stress: Not on file   Social Connections: Not on file   Intimate Partner Violence: Not on file   Housing Stability: Low Risk  (09/15/2022)    Yearly Questionnaire     Do you need any assistance with obtaining housing, meals, medication, transportation or medical equipment?: No     Assistance needed for:: Transportation       ALLERGIES: Patient has no known allergies.       REVIEW OF SYSTEMS  Review of Systems   Constitutional: Negative.    HENT: Negative.     Eyes: Negative.    Respiratory: Negative.     Cardiovascular: Negative.    Gastrointestinal: Negative.    Genitourinary: Negative.    Musculoskeletal:  Positive for neck pain.   Skin: Negative.    Neurological:  Positive for tingling, sensory change and focal weakness.         I have reviewed the ROS    PHYSICAL EXAMINATION  Mitchell Evans presents to the office by himself.      VITAL SIGNS  Today's  height is 5' 11 (1.803 m) and weight is 193 lb 9.6 oz (87.8 kg). His temporal temperature is 98.9 F (37.2 C). His blood pressure is 141/76 and his pulse is 67. His oxygen saturation is 97%.      General: Appearance in no apparent distress and well developed and well nourished   Body habitus: not obese  Affect: appropriate  Cardiovascular: Strong pulses  Peripheral Edema: None  Abdomen: Soft, nontender    Neurological Exam  Mitchell Evans is oriented to person, place, time, and situation and provides Fair history.    Memory:Intact to recent and distant events  Attention:Intact  Fund of Knowledge: Good  Speech: Fluent  Cranial Nerves:   Eyes:EOMI   Pupils:PERRL   Facial sensation:Intact to light touch   Hearing:Normal to spoken voice   Palate:Normal elevation   Shoulder shrug:5/5   Tongue protrusion:Midline    Gait: no instability  Assist devices: none  Musculoskeletal:  Right   Left    Deltoid 5 4   Biceps 5 4   Triceps 5 4-   Grip 4 4-   Intrinsics 4 4-   Hip Flex 5 5   Knee Flex  5 5   Knee Ext 5 5   Dorsal Flex 5 5   Plantar Flex 5 5     Deep Tendon Reflex   Right Left   Biceps    1  1   Patellar    1  1     Sensory: numbness/tingling  Hoffman: negative      REVIEW OF TESTS    EXAM: XR CERVICAL SPINE WITH FLEXION AND EXTENSION VIEWS    INDICATION: Cervical radiculopathy    COMPARISON: None.    TECHNIQUE: AP, lateral, and odontoid views of the cervical spine with lateral flexion and extension views.    FINDINGS:  Changes from C6-C7 ACDF with mature osseous fusion. No visualized hardware complication. There is mild grade 1 anterolisthesis of C2-C3 and retrolisthesis C3-C4, C4-C5, and C5-C6. There is reduction with flexion at C3-C4 and C4-C5. Multilevel hypertrophic endplate changes with diffuse moderate intervertebral disc height loss. Moderate diffuse facet arthrosis.    Impression   IMPRESSION:    1.  C6-C7 ACDF with no hardware complication.  2.  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      Report Verified by: Arville Care, MD at 09/16/2022 12:08 PM EST       All relevant tests and imaging reviewed and discussed with patient.      ASSESSMENT  Plan     89 y M who presents to clinic for follow-up after completion of his cervical x-rays.  He continues to report neck pain as well as worsening of his left upper extremity weakness.  Examination shows positive weakness in his left arm in comparison to his right.  Cervical x-rays show dynamic instability at the levels above the fusion at C3-4 and C4-5.  At this time given the weakness in his exam as well as worsening symptoms and dynamic instability on cervical x-rays recommend an MRI of his cervical spine to assess for compressive pathology.  Will follow-up with Dr. Richardson Dopp after completion of his MRI.    Today's body mass index is 27 kg/m. BMI is above normal. Weight loss is recommended however discussion was deferred  due to acute/urgent medical needs.      PLAN OF CARE    MRI cervical  FU after         Total time spent with patient was 30 minutes.  Greater than 50% of that time was spent counseling and coordinating the care of the patient.  Please see my assessment and plan for details.    Portions of this note were dictated using voice recognition software.  Please pardon any gramatical mistakes and don't hesitate to call our office with questions.     The patient is instructed to notify this office if there is any worsening of the condition or to return to the emergency room as needed.      Electronically signed by: Adrian Blackwater, CNP, 09/16/2022 11:03 AM   CC: [name], MD  CC: Referral, Self

## 2022-09-17 ENCOUNTER — Inpatient Hospital Stay: Payer: Medicare (Managed Care) | Attending: Family

## 2022-09-23 NOTE — Unmapped (Signed)
Procedure moved to 11/15/2022

## 2022-09-27 ENCOUNTER — Encounter: Attending: Gastroenterology

## 2022-09-30 ENCOUNTER — Encounter: Attending: Gastroenterology

## 2022-09-30 ENCOUNTER — Ambulatory Visit: Payer: Medicare (Managed Care)

## 2022-09-30 MED ORDER — pregabalin (LYRICA) 150 MG capsule
150 | ORAL_CAPSULE | Freq: Two times a day (BID) | ORAL | 0 refills | Status: AC
Start: 2022-09-30 — End: 2022-12-29

## 2022-09-30 NOTE — Unmapped (Signed)
08/04/2022 [name], MD     10/19/2022 [name], MD    [name] pulled last refill 08/24/2022 for 30 days

## 2022-10-07 ENCOUNTER — Ambulatory Visit: Payer: Medicare (Managed Care)

## 2022-10-11 MED ORDER — polyethylene glycol (GOLYTELY) 236-22.74-6.74 -5.86 gram solution
236-22.74-6.74 | ORAL | 0 refills | 14.00000 days | Status: AC
Start: 2022-10-11 — End: ?

## 2022-10-19 ENCOUNTER — Ambulatory Visit: Admit: 2022-10-19 | Payer: Medicare (Managed Care)

## 2022-10-19 DIAGNOSIS — J3089 Other allergic rhinitis: Secondary | ICD-10-CM

## 2022-10-19 MED ORDER — tadalafiL (CIALIS) 5 MG tablet
5 | ORAL_TABLET | Freq: Every day | ORAL | 3 refills | 30.00000 days | Status: AC
Start: 2022-10-19 — End: 2022-11-10

## 2022-10-19 MED ORDER — azelastine (ASTELIN) 137 mcg (0.1 %) nasal spray
137 | Freq: Two times a day (BID) | NASAL | 0 refills | Status: AC
Start: 2022-10-19 — End: ?

## 2022-10-19 NOTE — Unmapped (Signed)
Pre-Visit Preventative Plannning  Lab Results   Component Value Date    HGBA1C 5.0 11/08/2021     Health Maintenance   Topic Date Due    Colorectal Cancer Screening (MyChart)  07/18/2022    Annual Medicare Wellness Visit  11/08/2022    Comprehensive Physical Exam  11/08/2022    Depression Monitoring (PHQ-9)  07/07/2023    Renal Function/GFR  08/05/2023    Diabetes Screening  11/08/2024    Immunization: DTaP/Tdap/Td (2 - Td or Tdap) 11/15/2027    Hepatitis C Screening (MyChart)  Completed    HIV Screening  Completed    Immunization: Influenza (MyChart)  Completed    Immunization: Zoster  Completed    Immunization: RSV (Adult)  Completed    Immunization: Pneumococcal  Aged [employer] History   Administered Date(s) Administered    COVID-19, mRNA, Pfizer 2023 monovalent tris-sucrose, age 37+ 08/04/2022    COVID-19, mRNA, Pfizer bivalent booster, tris-sucrose, age 37+ 07/17/2021    COVID-19, mRNA, Pfizer monovalent 12/25/2019, 01/22/2020, 07/14/2020, 01/14/2021    Influenza, quadrivalent, preservative-free 06/26/2018, 05/31/2019, 06/15/2020, 07/17/2021    Influenza, unspecified 06/28/2022    Pneumococcal polysaccharide, 23-valent 06/16/2020    RSV, recombinant (Arexvy) 06/28/2022    Zoster, recombinant 09/22/2020, 01/19/2021    tdap 11/14/2017     CC:   Chief Complaint   Patient presents with    Nasal Congestion     C/o sinus congestion.      HPI: See A&P for details of HPI    Having spine pain and refuses to take pills.  Changed diet and exercising and helping.  Learning to live with the pain.  Using MJ to help     Has stopped working due to the pain.      Trying to keep the indepence that he can.      Having some chronic sinus congestion - flonase helping some.      Using cialis and lyrica as helping.          ROS: Please see HPI    Home Meds:  Current Outpatient Medications   Medication Sig    atorvastatin Take 1 tablet (20 mg total) by mouth daily.    cholecalciferol (vitamin D3) Take by mouth.    lidocaine  Place 1 patch onto the skin daily. Apply patch for 12 hours and then remove patch and leave off for 12 hours.    lisinopriL Take 1 tablet (5 mg total) by mouth daily. NEW LOWER DOSAGE    multivitamin Take 1 capsule by mouth daily.    polyethylene glycol To Prep for colonoscopy. Drink 8oz every 20-30 minutes until the entire bottle is consumed.    pregabalin Take 1 capsule (150 mg total) by mouth 2 times a day.    tadalafiL Take 1 tablet (5 mg total) by mouth daily. USE GOOD RX    azelastine Use 1 spray into each nostril 2 times a day. Use in each nostril as directed     No current facility-administered medications for this visit.     PE:  BP 135/58 (BP Location: Left upper arm, Patient Position: Sitting, BP Cuff Size: Large)   Pulse 74   Resp 16   Ht 5' 11 (1.803 m)   Wt 193 lb (87.5 kg)   SpO2 99%   BMI 26.92 kg/m   Wt Readings from Last 3 Encounters:   10/19/22 193 lb (87.5 kg)   09/16/22 193 lb 9.6 oz (87.8 kg)  08/10/22 196 lb (88.9 kg)     Today's body mass index is 26.92 kg/m.     General: Well appearing, well developed, well noursed. No acute distress.    Skin: No rashes or suspicious lesions  Nose: inferior turbinates swollen bilaterally   Cardiac: Regular rate and rhythm. No murmurs rubs or gallops.  Normal S1 and S2.    Respiratory: Clear to auscultation bilateral.  No respiratory distress.  No Tachypnea  Psych: Alert and oriented.  Normal mood and affect    A/P: Mitchell Evans is a 62 y.o. male here for allergic rhinitis not under optimal control, add Astelin     Justinn Welter was seen today for nasal congestion.    Diagnoses and all orders for this visit:    Non-seasonal allergic rhinitis, unspecified trigger (Primary)  -     azelastine (ASTELIN) 137 mcg (0.1 %) nasal spray; Use 1 spray into each nostril 2 times a day. Use in each nostril as directed      [name], MD  [place]

## 2022-10-19 NOTE — Unmapped (Signed)
08/04/2022 [name], MD     10/19/2022 [name], MD

## 2022-11-11 NOTE — Unmapped (Signed)
10/19/2022 [name], MD     11/24/2022 [name], MD

## 2022-11-14 MED ORDER — tadalafiL (CIALIS) 5 MG tablet
5 | ORAL_TABLET | Freq: Every day | ORAL | 3 refills
Start: 2022-11-14 — End: 2022-12-30

## 2022-11-14 NOTE — Unmapped (Signed)
Pt called to cancel colonoscopy for 11/15/2022 at 4:00pm with Dr. Demetria Pore. Pt is POS for Covid. Pt will call back to reschedule when he is symptom free.

## 2022-11-14 NOTE — Unmapped (Signed)
Called pt and pt phone is not working

## 2022-11-15 ENCOUNTER — Encounter: Attending: Gastroenterology

## 2022-11-16 MED ORDER — lisinopriL (PRINIVIL) 5 MG tablet
5 | ORAL_TABLET | Freq: Every day | ORAL | 1 refills | Status: AC
Start: 2022-11-16 — End: ?

## 2022-11-16 NOTE — Unmapped (Signed)
Received a [facility] form  Dr Lula Olszewski wanted me to see what the reason for the form. We do not ever have any reason on problem list that suggest need for [facility] form. (No fridge medications, neb use, etc)     Lmtcb and sent mychart.

## 2022-11-16 NOTE — Unmapped (Signed)
Called pt- message was given. Pt understands its a one time thing.   Form will be faxed today.

## 2022-11-16 NOTE — Unmapped (Signed)
Pt called, gave msg. Pt says that the cold air impacts his body, causing him pain. He says he nds the heat in order to not have these moments where pain kicks in.

## 2022-11-16 NOTE — Unmapped (Signed)
Call pt    This would be against office policy for this but will make a one time exception.  Will be unable to do this again in the future.    [name], MD  [place]

## 2022-11-24 ENCOUNTER — Other Ambulatory Visit: Admit: 2022-11-24 | Payer: Medicare (Managed Care)

## 2022-11-24 ENCOUNTER — Ambulatory Visit: Admit: 2022-11-24 | Discharge: 2022-11-24 | Payer: Medicare (Managed Care)

## 2022-11-24 DIAGNOSIS — Z01810 Encounter for preprocedural cardiovascular examination: Secondary | ICD-10-CM

## 2022-11-24 LAB — URINALYSIS W/RFL TO MICROSCOPIC
Bilirubin, UA: NEGATIVE
Blood, UA: NEGATIVE
Glucose, UA: NEGATIVE mg/dL
Ketones, UA: NEGATIVE mg/dL
Leukocytes, UA: NEGATIVE
Nitrite, UA: NEGATIVE
Protein, UA: NEGATIVE mg/dL
Specific Gravity, UA: 1.009 (ref 1.005–1.035)
Urobilinogen, UA: <2 mg/dL (ref 0.2–1.9)
pH, UA: 6 (ref 5.0–8.0)

## 2022-11-24 LAB — COMPREHENSIVE METABOLIC PANEL, SERUM
ALT: 28 U/L (ref 7–52)
AST (SGOT): 29 U/L (ref 13–39)
Albumin: 4.5 g/dL (ref 3.5–5.7)
Alkaline Phosphatase: 71 U/L (ref 36–125)
Anion Gap: 3 mmol/L (ref 3–16)
BUN: 8 mg/dL (ref 7–25)
CO2: 34 mmol/L — ABNORMAL HIGH (ref 21–33)
Calcium: 9.4 mg/dL (ref 8.6–10.3)
Chloride: 104 mmol/L (ref 98–110)
Creatinine: 0.78 mg/dL (ref 0.60–1.30)
EGFR: >90
Glucose: 69 mg/dL — ABNORMAL LOW (ref 70–100)
Osmolality, Calculated: 289 mOsm/kg (ref 278–305)
Potassium: 4.7 mmol/L (ref 3.5–5.3)
Sodium: 141 mmol/L (ref 133–146)
Total Bilirubin: 0.4 mg/dL (ref 0.0–1.5)
Total Protein: 7.8 g/dL (ref 6.4–8.9)

## 2022-11-24 LAB — IRON STUDIES
% Iron Saturation: 2.5 % — ABNORMAL LOW (ref 15.0–55.0)
Iron: 11 ug/dL — ABNORMAL LOW (ref 50–212)
TIBC: 438 ug/dL (ref 261–462)

## 2022-11-24 LAB — CBC
Hematocrit: 30.8 % — ABNORMAL LOW (ref 38.5–50.0)
Hemoglobin: 9.7 g/dL — ABNORMAL LOW (ref 13.2–17.1)
MCH: 25.7 pg — ABNORMAL LOW (ref 27.0–33.0)
MCHC: 31.4 g/dL — ABNORMAL LOW (ref 32.0–36.0)
MCV: 81.9 fL (ref 80.0–100.0)
MPV: 9.9 fL (ref 7.5–11.5)
Platelet Estimate: ADEQUATE
Platelets: 163 10*3/uL (ref 140–400)
RBC: 3.76 10*6/uL — ABNORMAL LOW (ref 4.20–5.80)
RDW: 19.3 % — ABNORMAL HIGH (ref 11.0–15.0)
WBC: 8.6 10*3/uL (ref 3.8–10.8)

## 2022-11-24 LAB — DIFFERENTIAL
Basophils Absolute: 77 /uL (ref 0–200)
Basophils Relative: 0.9 % (ref 0.0–1.0)
Eosinophils Absolute: 95 /uL (ref 15–500)
Eosinophils Relative: 1.1 % (ref 0.0–8.0)
Lymphocytes Absolute: 2236 /uL (ref 850–3900)
Lymphocytes Relative: 26 % (ref 15.0–45.0)
Monocytes Absolute: 714 /uL (ref 200–950)
Monocytes Relative: 8.3 % (ref 0.0–12.0)
Neutrophils Absolute: 5478 /uL (ref 1500–7800)
Neutrophils Relative: 63.7 % (ref 40.0–80.0)
PLT Morphology: NORMAL
nRBC: 0 /100 WBC (ref 0–0)

## 2022-11-24 LAB — HEMOGLOBIN A1C: Hemoglobin A1C: 4.8 % (ref 4.0–5.6)

## 2022-11-24 LAB — LIPID PANEL
Cholesterol, Total: 158 mg/dL (ref 0–200)
HDL: 43 mg/dL — ABNORMAL LOW (ref 60–92)
LDL Cholesterol: 93 mg/dL
Non-HDL Cholesterol, Calculated: 115 mg/dL (ref 0–129)
Triglycerides: 112 mg/dL (ref 10–149)

## 2022-11-24 LAB — FERRITIN: Ferritin: 3.5 ng/mL — ABNORMAL LOW (ref 23.9–336.2)

## 2022-11-24 LAB — VITAMIN B12: Vitamin B-12: 695 pg/mL (ref 180–914)

## 2022-11-24 NOTE — Consults (Signed)
This content was originally created in the [facility] after review and signature by the treating provider as part of a face-to-face patient visit.     --- Text above this line is informational and is not part of the [facility] ---    Patient Name: Mitchell Evans  DOB: January 25, 1961  DOS: 11/24/2022  Medical Record #: [id number]  Treating Provider: [name], MD      SUBJECTIVE    Reason for Visit:  The patient is a 62 year-old, black/african [place], male presenting for a face-to-face subsequent annual wellness visit on 11/24/2022.      Primary Care Physician:  [name], MD      Current Healthcare Providers:  Monia Pouch, MD, Neurologist  Suella Broad (Will) Vallarie Mare, MD, Pain Specialist      Current Medications:  Atorvastatin Calcium 20mg  Oral tablet Oral once a day (QD)  Cholecalciferol 163mcg Oral capsule Oral once a day (QD)  Lidocaine 5% Transdermal patch - 24 Hour Transdermal Place 1 patch onto the skin daily. Apply patch for 12 hours and then remove patch and leave off for 12 hours.  Lisinopril 5mg  Oral tablet Oral once a day (QD)  Pregabalin 150mg  Oral capsule Oral twice a day (BID)  Tadalafil 5mg  Oral tablet Oral once a day (QD)      Drug Allergies:  NKDA      Active problem list for the current date of service:  Aortic Atherosclerosis      Social History: Patient Responses    Smoking History:  What is your history of smoking cigarettes? Never Smoked      Family History: Patient Responses  Family History Unknown      Activities of Daily Living Scale: Patient Responses  Basic ADL Screening within the last 12 months? No         Instrumental ADL Screening within the last 12 months? No       PHQ-2:  PHQ-2 screening within the last 12 months? (If multiple exist, document the most recent) No  PHQ-9:  PHQ-9 screening within the last 12 months? (If multiple exist, document the most recent) No      Patient Reported Symptoms: Patient Responses  Urinary incontinence assessment within the last 12  months? Yes  Date of Assessment: 07/06/2022  Many people experience problems with urinary incontinence, the leakage of urine. In the past 6 months, have you accidentally leaked urine? No      OBJECTIVE    Biometrics:  Height: 70.98 In  Weight: 197.09 lbs  BMI: 27.5  Blood Pressure: 121/66      Preventive Tests/Services:  Lipid Panel within the last 5 years? Yes 11/08/2021  Total Cholesterol:120 mg/dL, High Density Lipoprotein (HDL):35 mg/dL, Low Density Lipoprotein (LDL):63 mg/dL, Triglycerides:110 mg/dL  Stool DNA (MT-sDNA, FIT-DNA, Cologuard) within the last 3 years? No     Colonoscopy within the last 10 years? Yes 07/18/2017 U/A U/A  Influenza Virus Vaccine within the last year? Yes 06/28/2022    Pneumococcal Conjugate Vaccine No     Pneumococcal Polysaccharide Vaccine Yes 06/16/2020  1st Dose  Fecal Occult Blood Test (FIT, gFOBT) within the last year? No     Most recent Bone Mineral Density (BMD) Test: No     Ultrasound for Abdominal Aortic Aneurysm (AAA) in your lifetime? No     Serum Glucose within the last year? Yes 08/04/2022  74 mg/dL  Hemoglobin A1c within the last year? No     Sexually Transmitted Infections (STIs) Screening  within the last year? No     Human Immunodeficiency Virus (HIV) Screening within the last year? No     Hepatitis C Virus (HCV) Screening Yes 08/23/2021    Hepatitis B Virus (HBV) Screening Yes 11/08/2021    Hepatitis B Virus (HBV) Vaccine ever in your lifetime? No         Additional Test/Therapies History:  Glomerular Filtration Rate (GFR) Yes 08/04/2022  102 mL/min/1.59m  Retinal Eye Exam No   Reason: Not Found in Medical Record      ASSESSMENT/PLAN    The patient was seen at a face to face encounter. After review of the medical record, which includes the problem list as of the current date of service, the following clinically significant conditions were assessed, monitored, evaluated, and/or treated, as indicated below:    Current Diagnosis: I70.0 Atherosclerosis of aorta  Basis  of Diagnosis: (Evaluation)  1. Previously Evaluated / Current Condition  Status: (Assessment)  1. Stable  Monitor:   1. Continue to Monitor  Treatment:   Medications used to treat this condition are listed below:  Atorvastatin Calcium 20mg  Oral once a day (QD)    Treatment:   1. Continue Current Treatment  Supporting Documentation:  CT ABDOMEN AND PELVIS WO IV CONTRAST, 06/30/2022, K Mindi Slicker, MD:  FINDINGS:  Atherosclerotic calcifications involve the abdominal aorta and iliac arteries. No underlying aneurysmal dilation is seen. There is no evidence of retroperitoneal hematoma.          Current Diagnosis: E78.2 Mixed hyperlipidemia  Treatment:   Medications used to treat this condition are listed below:  Atorvastatin Calcium 20mg  Oral once a day (QD)    Treatment:   1. Continue Current Treatment  Supporting Documentation:  CURRENT MEDICATIONS:  Atorvastatin Calcium 20mg  Oral tablet Oral once a day (QD)          Current Diagnosis: I10 Essential (primary) hypertension  Treatment:   Medications used to treat this condition are listed below:  Lisinopril 5mg  Oral once a day (QD)    Treatment:   1. Continue Current Treatment  Supporting Documentation:  PCP OFFICE NOTE, 08/04/2022,Obed Samek, MD:  A/P:  Essential hypertension (Primary)  Assessment & Plan:  Having some dizziness and lower BP readings. Decrease Lisinopril. Reviewed BMP 1/23  Orders:  - Basic metabolic panel; Future  - lisinopriL (PRINIVIL) 5 MG tablet; Take 1 tablet (5 mg total) by mouth daily. NEW LOWER DOSAGE            Quality Measures Impacted during the Preventive Services Visit:  MACRA: MIPS [id number] Colorectal cancer screening results documented and reviewed Z00.00  (Encounter for general adult medical examination without abnormal findings)  MACRA: MIPS [phone] Influenza immunization administered or previously received Z00.00  (Encounter for general adult medical examination without abnormal findings)  MACRA: MIPS [phone] Patient receiving angiotensin  converting enzyme (ace) inhibitor or angiotensin receptor blocker (arb) therapy Z00.00  (Encounter for general adult medical examination without abnormal findings)  MACRA: MIPS [id number] Most recent systolic blood pressure < 140 mmHg Z00.00  (Encounter for general adult medical examination without abnormal findings)  MACRA: MIPS [id number] Most recent diastolic blood pressure < 90 mmHg Z00.00  (Encounter for general adult medical examination without abnormal findings)  MACRA: MIPS [id number] Patient received one-time screening for HCV infection Z00.00  (Encounter for general adult medical examination without abnormal findings)  MACRA: [place] Patient screened for tobacco use AND identified as a tobacco non-user Z00.00  (Encounter for general adult medical examination without abnormal  findings)  Z68.27 Body mass index [BMI] 27.0-27.9, adult Z00.00  (Encounter for general adult medical examination without abnormal findings)          e-Sign Provider Attestation  By clicking the Sign [facility] Record button I, [name], MD, hereby attest that the medical record entry for 11/24/2022 accurately reflects my signature and notations made in my capacity as the Treating Provider for conditions affecting the care and management of the patient based on my review and inclusion of documentation from current and/or prior dates of service. I do hereby attest that this information is true, accurate and complete to the best of my knowledge.    Electronically Signed By: [name], MD  Date: 2024-02-08T20:20:16+00:00  Copyright  [facility] [facility]. All rights reserved

## 2022-11-24 NOTE — Progress Notes (Signed)
Formatting of this note is different from the original.  A/P: Plan based on AHA/ACC guideline: Surgery pending ECHO & Labs.      Perioperative B Blocker (bisoprolol 2.5 mg QD or metoprolol succinate 50 mg QD initially then titrated to goal of SBP >100 mm Hg and pulse 60-70 with patient stable on regimen and at goal for at least 7 days prior to surgery) indicated?: no  Medications changes prior to surgery : None  Medications to be taken the day of surgery: None  Antiplatelet agents (should be held 7-10 days prior to procedure if risk of bleeding outweighs risk of perioperative thrombotic event.  Full course of clopidogrel for ACS and DE stent should be attempted if possible prior to surgery): No NSAIDS or ASA for 7 days prior  SSRI (should be held for 3 weeks prior to surgery if high risk of bleeding such as intracranial procedures): N/A    Chief Complaint   Patient presents with    Annual Exam     Needs HCPOA packet     HPI: The patient is seen at the request of Dr. Festus Barren [phone]) for preoperative evaluation and risk management due to the upcoming surgery on 12/13/22 for right foot bone spur    Pt denies any problems with blood clots, excessive bleeding, trouble healing or anesthesia with prior surgeries     Pt denies any CP, SOB, palpitations, fever, recent illness, cough, lice, bed bugs or h/o MRSA     Denies unintentional weight loss.  Denies abdominal pain, blood in stool, diarrhea or constipation.  Denies leg swelling.     RISK OF SURGICAL PROCEDURE: Intermediate   Low=endoscopy, bronchoscopy hysteroscopy, cystoscopy, derm. Surgery, breast surgery, eye procedure  Intermediate=ortho, urology, uncomplicated abdominal, thoracic or ENT, CEA, prostate   High=Emergency surgery (esp>75), major thoracic, cardiac, aortic/major vascular, PVD procedures, >4 hours, anticipated large fluid shifts/blood loss    > 4 METS performed without chest pain: Yes     MAJOR CLINICAL PREDICTORS: ([place], MI in past month,  decompensated CHF, severe valvular HD, significant dysrhythmia): No   INTERMEDIATE CLINICAL PREDICTORS: Mild angina, prior MI, compensated CHF, DM, renal insufficiency: No   MINOR CLINICAL PREDICTORS: advanced age, abnormal ECG, abnormal rhythm, low functional capacity, history of stroke, uncontrolled HTN: Advance age    Patient Active Problem List   Diagnosis    Prediabetes    Low serum HDL    Hepatitis C 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    Normocytic anemia    Bilateral sciatica    Essential hypertension     ROS: See HPI     No Known Allergies    Past Medical History:   Diagnosis Date    Essential hypertension     Hepatitis C 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 08/16/2021    Needs confirmed    Hyperlipidemia     Low serum HDL 08/16/2021    Normocytic anemia 11/09/2021    Prediabetes 08/16/2021    Spinal stenosis of cervical region      Past Surgical History:   Procedure Laterality Date    CERVICAL FUSION      KNEE SURGERY      SPINE SURGERY  2003     Family History   Problem Relation Age of Onset    Colon Cancer Mother     Cancer Mother         Colon cancer    Alzheimer's disease Father     Diabetes Sister     Diabetes Sister  Social History     Socioeconomic History    Marital status: Married     Spouse name: Not on file    Number of children: Not on file    Years of education: Not on file    Highest education level: Not on file   Occupational History    Not on file   Tobacco Use    Smoking status: Never    Smokeless tobacco: Never   Substance and Sexual Activity    Alcohol use: Not Currently    Drug use: Not Currently     Types: Marijuana, Medical Marijuana    Sexual activity: Yes     Partners: Female     Birth control/protection: Abstinence, None   Other Topics Concern    Caffeine Use No    Occupational Exposure No    Exercise Yes    Seat Belt Yes   Social History Narrative    Married, step kids, retired      Investment banker, operational of Company secretary Strain: Not on file   Food Insecurity: No [place] (09/15/2022)     Yearly Questionnaire     Do you need any assistance with obtaining housing, meals, medication, transportation or medical equipment?: No     Assistance needed for:: Transportation   Transportation Needs: No Transportation Needs (09/15/2022)    Yearly Questionnaire     Do you need any assistance with obtaining housing, meals, medication, transportation or medical equipment?: No     Assistance needed for:: Transportation   Recent Concern: Transportation Needs - Unmet Transportation Needs (07/25/2022)    Yearly Questionnaire     Do you need any assistance with obtaining housing, meals, medication, transportation or medical equipment?: Yes     Assistance needed for:: Transportation   Physical Activity: Not on file   Stress: Not on file   Social Connections: Not on file   Intimate Partner Violence: Not on file   Housing Stability: [place]  (09/15/2022)    Yearly Questionnaire     Do you need any assistance with obtaining housing, meals, medication, transportation or medical equipment?: No     Assistance needed for:: Transportation     Current Outpatient Medications   Medication Sig    atorvastatin Take 1 tablet (20 mg total) by mouth daily.    azelastine Use 1 spray into each nostril 2 times a day. Use in each nostril as directed    cholecalciferol (vitamin D3) Take by mouth.    lidocaine Place 1 patch onto the skin daily. Apply patch for 12 hours and then remove patch and leave off for 12 hours.    lisinopriL Take 1 tablet (5 mg total) by mouth daily. NEW LOWER DOSAGE    multivitamin Take 1 capsule by mouth daily.    pregabalin Take 1 capsule (150 mg total) by mouth 2 times a day.    tadalafiL Take 1 tablet (5 mg total) by mouth daily. USE GOOD RX    polyethylene glycol To Prep for colonoscopy. Drink 8oz every 20-30 minutes until the entire bottle is consumed.     No current facility-administered medications for this visit.     Physical exam:   BP 121/66 (BP Location: Right upper arm, Patient Position: Sitting, BP  Cuff Size: Large)   Pulse 75   Resp 12   Ht 5' 11"$  (1.803 m)   Wt 197 lb (89.4 kg)   SpO2 98%   BMI 27.48 kg/m  General: Well appearing. No acute distress.  Nares: patent with no congestion, rhinorrhea or polyps  Ears: Canals clear, TM's normal  OP: normal mucous membranes and no exudate/erythema  Neck: Supple, FROM, no LAD  Dentition: fair, some missing teeth   Psych: alert and oriented.  Normal mood and affect  Cardiac: regular rate and rhythm. No murmurs rubs or gallops.  Normal S1 and S2.     Respiratory: clear to auscultation bilateral.  No respiratory distress.    ABD: flat, +BS, no HSM.  Non-tender.  Soft.  No mass.    Vascular: no edema    EKG: NSR, nl intervals, nl rate, nl axis, PAC and [name] without prior EKG to compare, no ST segment elevation or depression, no signs of atrial enlargement, no acute ischemia or infarct - EKG scanned under CARDS tab     Calvin "Nicole Kindred" was seen today for annual exam.    Diagnoses and all orders for this visit:    Pre-operative cardiovascular examination (Primary)  -     CBC; Future  -     Differential; Future  -     Urinalysis w/Rfl to Microscopic; Future  -     Comprehensive Metabolic Panel, Serum; Future  -     PCN ECG 12-Lead (Non-MUSE)  -     PCN ECG 12-Lead (Non-MUSE)    Screening for diabetes mellitus  -     Hemoglobin A1c; Future    Screening, lipid  -     Lipid Profile; Future    PAC (premature atrial contraction)  -     Echo 2D Complete (TTE); Future    [name] (left ventricular hypertrophy)  -     Echo 2D Complete (TTE); Future    [name], MD  [facility]    Electronically signed by [name], MD at 11/24/2022  3:15 PM EST

## 2022-11-24 NOTE — Consults (Signed)
Formatting of this note might be different from the original.  This content was originally created in the [facility] after review and signature by the treating provider as part of a face-to-face patient visit.     --- Text above this line is informational and is not part of the [facility] ---    Patient Name: Calvin George  DOB: 01-03-1961  DOS: 11/24/2022  Medical Record #: [id number]  Treating Provider: [name], MD    SUBJECTIVE    Reason for Visit:  The patient is a 62 year-old, black/african [place], male presenting for a face-to-face subsequent annual wellness visit on 11/24/2022.    Primary Care Physician:  [name], MD    Current Healthcare Providers:  Monia Pouch, MD, Neurologist  Suella Broad (Will) Vallarie Mare, MD, Pain Specialist    Current Medications:  Atorvastatin Calcium 139m Oral tablet Oral once a day (QD)  Cholecalciferol 1269m Oral capsule Oral once a day (QD)  Lidocaine 5% Transdermal patch - 24 Hour Transdermal Place 1 patch onto the skin daily. Apply patch for 12 hours and then remove patch and leave off for 12 hours.  Lisinopril 39m839mral tablet Oral once a day (QD)  Pregabalin 150m61mal capsule Oral twice a day (BID)  Tadalafil 39mg 71ml tablet Oral once a day (QD)    Drug Allergies:  NKDA    Active problem list for the current date of service:  Aortic Atherosclerosis    Social History: Patient Responses    Smoking History:  What is your history of smoking cigarettes? Never Smoked    Family History: Patient Responses  Family History Unknown    Activities of Daily Living Scale: Patient Responses  Basic ADL Screening within the last 12 months? No     Instrumental ADL Screening within the last 12 months? No     PHQ-2:  PHQ-2 screening within the last 12 months? (If multiple exist, document the most recent) No  PHQ-9:  PHQ-9 screening within the last 12 months? (If multiple exist, document the most recent) No    Patient Reported Symptoms: Patient Responses  Urinary  incontinence assessment within the last 12 months? Yes  Date of Assessment: 07/06/2022  Many people experience problems with urinary incontinence, the leakage of urine. In the past 6 months, have you accidentally leaked urine? No    OBJECTIVE    Biometrics:  Height: 70.98 In  Weight: 197.09 lbs  BMI: 27.5  Blood Pressure: 121/66    Preventive Tests/Services:  Lipid Panel within the last 5 years? Yes 11/08/2021  Total Cholesterol:120 mg/dL, High Density Lipoprotein (HDL):35 mg/dL, Low Density Lipoprotein (LDL):63 mg/dL, Triglycerides:110 mg/dL  Stool DNA (MT-sDNA, FIT-DNA, Cologuard) within the last 3 years? No     Colonoscopy within the last 10 years? Yes 07/18/2017 U/A U/A  Influenza Virus Vaccine within the last year? Yes 06/28/2022    Pneumococcal Conjugate Vaccine No     Pneumococcal Polysaccharide Vaccine Yes 06/16/2020  1st Dose  Fecal Occult Blood Test (FIT, gFOBT) within the last year? No     Most recent Bone Mineral Density (BMD) Test: No     Ultrasound for Abdominal Aortic Aneurysm (AAA) in your lifetime? No     Serum Glucose within the last year? Yes 08/04/2022  74 mg/dL  Hemoglobin A1c within the last year? No     Sexually Transmitted Infections (STIs) Screening within the last year? No     Human Immunodeficiency Virus (HIV) Screening within the last year? No  Hepatitis C Virus (HCV) Screening Yes 08/23/2021    Hepatitis B Virus (HBV) Screening Yes 11/08/2021    Hepatitis B Virus (HBV) Vaccine ever in your lifetime? No       Additional Test/Therapies History:  Glomerular Filtration Rate (GFR) Yes 08/04/2022  102 mL/min/1.16m Retinal Eye Exam No   Reason: Not Found in Medical Record    ASSESSMENT/PLAN    The patient was seen at a face to face encounter. After review of the medical record, which includes the problem list as of the current date of service, the following clinically significant conditions were assessed, monitored, evaluated, and/or treated, as indicated below:    Current Diagnosis:  I70.0 Atherosclerosis of aorta  Basis of Diagnosis: (Evaluation)  1. Previously Evaluated / Current Condition  Status: (Assessment)  1. Stable  Monitor:   1. Continue to Monitor  Treatment:   Medications used to treat this condition are listed below:  Atorvastatin Calcium 254mOral once a day (QD)    Treatment:   1. Continue Current Treatment  Supporting Documentation:  CT ABDOMEN AND PELVIS WO IV CONTRAST, 06/30/2022, K AnMindi SlickerMD:  FINDINGS:  Atherosclerotic calcifications involve the abdominal aorta and iliac arteries. No underlying aneurysmal dilation is seen. There is no evidence of retroperitoneal hematoma.    Current Diagnosis: E78.2 Mixed hyperlipidemia  Treatment:   Medications used to treat this condition are listed below:  Atorvastatin Calcium 2079mral once a day (QD)    Treatment:   1. Continue Current Treatment  Supporting Documentation:  CURRENT MEDICATIONS:  Atorvastatin Calcium 70m64mal tablet Oral once a day (QD)    Current Diagnosis: I10 Essential (primary) hypertension  Treatment:   Medications used to treat this condition are listed below:  Lisinopril 5mg 35ml once a day (QD)    Treatment:   1. Continue Current Treatment  Supporting Documentation:  PCP OFFICE NOTE, 08/04/2022,Erin Moushey, MD:  A/P:  Essential hypertension (Primary)  Assessment & Plan:  Having some dizziness and lower BP readings. Decrease Lisinopril. Reviewed BMP 1/23  Orders:  - Basic metabolic panel; Future  - lisinopriL (PRINIVIL) 5 MG tablet; Take 1 tablet (5 mg total) by mouth daily. NEW LOWER DOSAGE    Quality Measures Impacted during the Preventive Services Visit:  MACRA: [id number] Colorectal cancer screening results documented and reviewed Z00.00  (Encounter for general adult medical examination without abnormal findings)  MACRA: MIPS [id number] immunization administered or previously received Z00.00  (Encounter for general adult medical examination without abnormal findings)  MACRA: MIPS [phone]  receiving angiotensin converting enzyme (ace) inhibitor or angiotensin receptor blocker (arb) therapy Z00.00  (Encounter for general adult medical examination without abnormal findings)  MACRA: MIPS [id number] recent systolic blood pressure < 140 mmHg Z00.00  (Encounter for general adult medical examination without abnormal findings)  MACRA: MIPS [id number] Most recent diastolic blood pressure < 90 mmHg Z00.00  (Encounter for general adult medical examination without abnormal findings)  MACRA: MIPS [id number] received one-time screening for HCV infection Z00.00  (Encounter for general adult medical examination without abnormal findings)  MACRA: [place] screened for [place] use AND identified as a tobacco non-user Z00.00  (Encounter for general adult medical examination without abnormal findings)  Z68.27 Body mass index [BMI] 27.0-27.9, adult Z00.00  (Encounter for general adult medical examination without abnormal findings)    e-Sign Provider Attestation  By clicking the Sign [facility] Record button I, Erin [name] hereby attest that the medical record entry for 11/24/2022  accurately reflects my signature and notations made in my capacity as the Treating Provider for conditions affecting the care and management of the patient based on my review and inclusion of documentation from current and/or prior dates of service. I do hereby attest that this information is true, accurate and complete to the best of my knowledge.    Electronically Signed By: [name], MD  Date: 2024-02-08T20:20:16+00:00  Copyright  [place]. All rights reserved  Electronically signed by [name], MD at 11/24/2022  3:20 PM EST

## 2022-11-24 NOTE — Progress Notes (Signed)
A/P: Plan based on AHA/ACC guideline: Surgery pending ECHO & Labs.      Perioperative B Blocker (bisoprolol 2.5 mg QD or metoprolol succinate 50 mg QD initially then titrated to goal of SBP >100 mm Hg and pulse 60-70 with patient stable on regimen and at goal for at least 7 days prior to surgery) indicated?: no  Medications changes prior to surgery : None  Medications to be taken the day of surgery: None  Antiplatelet agents (should be held 7-10 days prior to procedure if risk of bleeding outweighs risk of perioperative thrombotic event.  Full course of clopidogrel for ACS and DE stent should be attempted if possible prior to surgery): No NSAIDS or ASA for 7 days prior  SSRI (should be held for 3 weeks prior to surgery if high risk of bleeding such as intracranial procedures): N/A    Chief Complaint   Patient presents with    Annual Exam     Needs HCPOA packet     HPI: The patient is seen at the request of Dr. Festus Barren [phone]) for preoperative evaluation and risk management due to the upcoming surgery on 12/13/22 for right foot bone spur    Pt denies any problems with blood clots, excessive bleeding, trouble healing or anesthesia with prior surgeries     Pt denies any CP, SOB, palpitations, fever, recent illness, cough, lice, bed bugs or h/o MRSA     Denies unintentional weight loss.  Denies abdominal pain, blood in stool, diarrhea or constipation.  Denies leg swelling.     RISK OF SURGICAL PROCEDURE: Intermediate   Low=endoscopy, bronchoscopy hysteroscopy, cystoscopy, derm. Surgery, breast surgery, eye procedure  Intermediate=ortho, urology, uncomplicated abdominal, thoracic or ENT, CEA, prostate   High=Emergency surgery (esp>75), major thoracic, cardiac, aortic/major vascular, PVD procedures, >4 hours, anticipated large fluid shifts/blood loss    > 4 METS performed without chest pain: Yes     MAJOR CLINICAL PREDICTORS: ([place], MI in past month, decompensated CHF, severe valvular HD, significant dysrhythmia): No    INTERMEDIATE CLINICAL PREDICTORS: Mild angina, prior MI, compensated CHF, DM, renal insufficiency: No   MINOR CLINICAL PREDICTORS: advanced age, abnormal ECG, abnormal rhythm, low functional capacity, history of stroke, uncontrolled HTN: Advance age    Patient Active Problem List   Diagnosis    Prediabetes    Low serum HDL    Hepatitis C 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    Normocytic anemia    Bilateral sciatica    Essential hypertension     ROS: See HPI     No Known Allergies    Past Medical History:   Diagnosis Date    Essential hypertension     Hepatitis C 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 08/16/2021    Needs confirmed    Hyperlipidemia     Low serum HDL 08/16/2021    Normocytic anemia 11/09/2021    Prediabetes 08/16/2021    Spinal stenosis of cervical region      Past Surgical History:   Procedure Laterality Date    CERVICAL FUSION      KNEE SURGERY      SPINE SURGERY  2003     Family History   Problem Relation Age of Onset    Colon Cancer Mother     Cancer Mother         Colon cancer    Alzheimer's disease Father     Diabetes Sister     Diabetes Sister      Social History     Socioeconomic History  Marital status: Married     Spouse name: Not on file    Number of children: Not on file    Years of education: Not on file    Highest education level: Not on file   Occupational History    Not on file   Tobacco Use    Smoking status: Never    Smokeless tobacco: Never   Substance and Sexual Activity    Alcohol use: Not Currently    Drug use: Not Currently     Types: Marijuana, Medical Marijuana    Sexual activity: Yes     Partners: Female     Birth control/protection: Abstinence, None   Other Topics Concern    Caffeine Use No    Occupational Exposure No    Exercise Yes    Seat Belt Yes   Social History Narrative    Married, step kids, retired      Investment banker, operational of Company secretary Strain: Not on file   Food Insecurity: No Mitchell Evans (09/15/2022)    Yearly Questionnaire     Do you need any assistance with  obtaining housing, meals, medication, transportation or medical equipment?: No     Assistance needed for:: Transportation   Transportation Needs: No Transportation Needs (09/15/2022)    Yearly Questionnaire     Do you need any assistance with obtaining housing, meals, medication, transportation or medical equipment?: No     Assistance needed for:: Transportation   Recent Concern: Transportation Needs - Unmet Transportation Needs (07/25/2022)    Yearly Questionnaire     Do you need any assistance with obtaining housing, meals, medication, transportation or medical equipment?: Yes     Assistance needed for:: Transportation   Physical Activity: Not on file   Stress: Not on file   Social Connections: Not on file   Intimate Partner Violence: Not on file   Housing Stability: [place]  (09/15/2022)    Yearly Questionnaire     Do you need any assistance with obtaining housing, meals, medication, transportation or medical equipment?: No     Assistance needed for:: Transportation     Current Outpatient Medications   Medication Sig    atorvastatin Take 1 tablet (20 mg total) by mouth daily.    azelastine Use 1 spray into each nostril 2 times a day. Use in each nostril as directed    cholecalciferol (vitamin D3) Take by mouth.    lidocaine Place 1 patch onto the skin daily. Apply patch for 12 hours and then remove patch and leave off for 12 hours.    lisinopriL Take 1 tablet (5 mg total) by mouth daily. NEW LOWER DOSAGE    multivitamin Take 1 capsule by mouth daily.    pregabalin Take 1 capsule (150 mg total) by mouth 2 times a day.    tadalafiL Take 1 tablet (5 mg total) by mouth daily. USE GOOD RX    polyethylene glycol To Prep for colonoscopy. Drink 8oz every 20-30 minutes until the entire bottle is consumed.     No current facility-administered medications for this visit.     Physical exam:   BP 121/66 (BP Location: Right upper arm, Patient Position: Sitting, BP Cuff Size: Large)   Pulse 75   Resp 12   Ht 5' 11 (1.803 m)    Wt 197 lb (89.4 kg)   SpO2 98%   BMI 27.48 kg/m     General: Well appearing. No acute distress.  Nares: patent with  no congestion, rhinorrhea or polyps  Ears: Canals clear, TM's normal  OP: normal mucous membranes and no exudate/erythema  Neck: Supple, FROM, no LAD  Dentition: fair, some missing teeth   Psych: alert and oriented.  Normal mood and affect  Cardiac: regular rate and rhythm. No murmurs rubs or gallops.  Normal S1 and S2.     Respiratory: clear to auscultation bilateral.  No respiratory distress.    ABD: flat, +BS, no HSM.  Non-tender.  Soft.  No mass.    Vascular: no edema    EKG: NSR, nl intervals, nl rate, nl axis, PAC and [name] without prior EKG to compare, no ST segment elevation or depression, no signs of atrial enlargement, no acute ischemia or infarct - EKG scanned under CARDS tab     Mitchell Evans was seen today for annual exam.    Diagnoses and all orders for this visit:    Pre-operative cardiovascular examination (Primary)  -     CBC; Future  -     Differential; Future  -     Urinalysis w/Rfl to Microscopic; Future  -     Comprehensive Metabolic Panel, Serum; Future  -     PCN ECG 12-Lead (Non-MUSE)  -     PCN ECG 12-Lead (Non-MUSE)    Screening for diabetes mellitus  -     Hemoglobin A1c; Future    Screening, lipid  -     Lipid Profile; Future    PAC (premature atrial contraction)  -     Echo 2D Complete (TTE); Future    [name] (left ventricular hypertrophy)  -     Echo 2D Complete (TTE); Future    Mitchell Alf, MD  [facility]

## 2022-11-25 ENCOUNTER — Inpatient Hospital Stay: Admit: 2022-11-25 | Payer: Medicare (Managed Care)

## 2022-11-25 ENCOUNTER — Encounter

## 2022-11-25 DIAGNOSIS — I491 Atrial premature depolarization: Secondary | ICD-10-CM

## 2022-11-25 DIAGNOSIS — I340 Nonrheumatic mitral (valve) insufficiency: Secondary | ICD-10-CM

## 2022-11-28 MED ORDER — ferrous sulfate 325 (65 FE) MG tablet
325 | ORAL_TABLET | Freq: Every day | ORAL | 1 refills | Status: AC
Start: 2022-11-28 — End: ?

## 2022-11-28 NOTE — Telephone Encounter (Signed)
Formatting of this note might be different from the original.  Call pt     Inform labs show new iron deficiency anemia.  Needs upper and lower scope from GI doctor to make sure no bleeding or something else more serious going on.  This is very important to do.  For appointments, please call [phone].    Start ferrous sulfate iron replacement    ECHO shows mild enlarged of left ventricle and some back flow of blood through mitral valve.  This are mild and good bp control is most important.  See if ok to increase Lisinopril from 5 to 10 mg     [name], MD  [place]      Electronically signed by [name], MD at 11/28/2022  4:42 PM EST

## 2022-11-28 NOTE — Telephone Encounter (Addendum)
Call pt     Inform labs show new iron deficiency anemia.  Needs upper and lower scope from GI doctor to make sure no bleeding or something else more serious going on.  This is very important to do.  For appointments, please call [phone].    Start ferrous sulfate iron replacement    ECHO shows mild enlarged of left ventricle and some back flow of blood through mitral valve.  This are mild and good bp control is most important.  See if ok to increase Lisinopril from 5 to 10 mg     [name], MD  [place]

## 2022-11-29 NOTE — Telephone Encounter (Signed)
Formatting of this note might be different from the original.  Notified pt of message  Electronically signed by Mervyn Gay, MA at 11/29/2022  8:20 AM EST

## 2022-11-29 NOTE — Telephone Encounter (Signed)
Notified pt of message

## 2022-11-30 NOTE — Progress Notes (Signed)
Name_______________________________________Printed:____________________  Date and time of surgery__2/19/2024______0730________________Arrival Time:____0600____________   1. The instructions given regarding when and if a patient needs to stop oral intake prior to surgery varies.Follow the specific instructions you were given                  __x_Nothing to eat or to drink after Midnight the night before.                   ____Carbo loading or instructions will be given to select patients-if you have been given those instructions -please do the following                           The evening before your surgery after dinner before midnight drink 40 ounces of gatorade.If you are diabetic use sugar free.  The morning of surgery drink 40 ounces of water.This needs to be finished 3 hours prior to your surgery start time.    2. Take the following pills with a small sip of water on the morning of surgery___________________________________________________                  Do not take blood pressure medications ending in pril or sartan the eve prior to surgery or the morning of surgery. Dr [name] patient are not to take any medications the AM of surgery.         3. Aspirin, Ibuprofen, Advil, Naproxen, Vitamin E and other Anti-inflammatory products and supplements should be stopped for 5 -7days before surgery or as directed by your physician.   4. Check with your Doctor regarding stopping Plavix, Coumadin,Eliquis, Lovenox,Effient,Pradaxa,Xarelto, Fragmin or other blood thinners and follow their instructions.   5. Do not smoke, and do not drink any alcoholic beverages 24 hours prior to surgery.  This includes NA Beer.Refrain from the usage of any recreational drugs.   6. You may brush your teeth and gargle the morning of surgery.  DO NOT SWALLOW WATER   7. You MUST make arrangements for a responsible adult to stay on site while you are here and take you home after your surgery. You will not be allowed to leave alone or drive  yourself home.  It is strongly suggested someone stay with you the first 24 hrs. Your surgery will be cancelled if you do not have a ride home.   8. A parent/legal guardian must accompany a child scheduled for surgery and plan to stay at the hospital until the child is discharged.  Please do not bring other children with you.   9. Please wear simple, loose fitting clothing to the hospital.  Do not bring valuables (money, credit cards, checkbooks, etc.) Do not wear any makeup (including no eye makeup) or nail polish on your fingers or toes.             10. DO NOT wear any jewelry or piercings on day of surgery.  All body piercing jewelry must be removed.             11. If you have ___dentures, they will be removed before going to the OR; we will provide you a container.  If you wear ___contact lenses or ___glasses, they will be removed; please bring a case for them.             12. Please see your family doctor/pediatrician for a history & physical and/or concerning medications.  Bring any test results/reports from your physician's office.  PCP__________________Phone___________H&P Appt. Date________             13 If you  have a Living Will and Durable Power of Attorney for Healthcare, please bring in a copy.             55. Notify your Surgeon if you develop any illness between now and surgery  time, cough, cold, fever, sore throat, nausea, vomiting, etc.  Please notify your surgeon if you experience dizziness, shortness of breath or blurred vision between now & the time of your surgery             15. DO NOT shave your operative site 96 hours prior to surgery. For face & neck surgery, men may use an electric razor 48 hours prior to surgery.             16. Shower the night before or morning of surgery using an antibacterial soap or as you have been instructed.             17. To provide excellent care visitors will be limited to one in the room at any given time.             18.  Please bring picture ID and  insurance card.             19.  Visit our web site for additional information:  [email or web address]              20.During flu season no children under the age of 60 are permitted in the hospital for the safety of all patients.                              21. If you take a long acting insulin in the evening only  take half of your usual  dose the night  before your procedure              22. If you use a c-pap please bring DOS if staying overnight,             23.For your convenience [name] has a pharmacy on site to fill your prescriptions.             24. If you use oxygen and have a portable tank please bring it  with you the DOS             25. Bring a complete list of all your medications with name and dose include any supplements.             26. Other__________________________________________   *Please call pre admission testing if you any further questions   [name]         [place]   [place]    [place] [place]  [phone]   [place]       VISITOR POLICY(subject to change)    Current policy is 2 visitors per patient. No children. Mask is  at the discretion of the facility. Visiting hours are 8a-8p. Overnight visitors will be at the discretion of the nurse. All policies subject to change.      All above information reviewed with patient in person or by phone.Patient verbalizes understanding.All questions and concerns addressed.  Patient/Rep_patient___________________                                                                                                                                    PRE OP INSTRUCTIONS

## 2022-12-05 ENCOUNTER — Ambulatory Visit: Payer: Medicare (Managed Care)

## 2022-12-05 ENCOUNTER — Inpatient Hospital Stay: Payer: Medicare (Managed Care) | Attending: Foot & Ankle Surgery

## 2022-12-05 MED ORDER — ONDANSETRON HCL 4 MG/2ML IJ SOLN
4 | Freq: Once | INTRAMUSCULAR | Status: DC | PRN
Start: 2022-12-05 — End: 2022-12-05

## 2022-12-05 MED ORDER — ONDANSETRON HCL 4 MG/2ML IJ SOLN
4 | INTRAMUSCULAR | Status: AC
Start: 2022-12-05 — End: ?

## 2022-12-05 MED ORDER — OXYCODONE HCL 5 MG PO TABS
5 | ORAL | Status: DC | PRN
Start: 2022-12-05 — End: 2022-12-05

## 2022-12-05 MED ORDER — SODIUM CHLORIDE 0.9 % IV SOLN
0.9 | INTRAVENOUS | Status: DC | PRN
Start: 2022-12-05 — End: 2022-12-05

## 2022-12-05 MED ORDER — HYDROMORPHONE HCL 2 MG/ML IJ SOLN
2 | INTRAMUSCULAR | Status: DC | PRN
Start: 2022-12-05 — End: 2022-12-05

## 2022-12-05 MED ORDER — ROCURONIUM BROMIDE 50 MG/5ML IV SOLN
50 | INTRAVENOUS | Status: AC
Start: 2022-12-05 — End: ?

## 2022-12-05 MED ORDER — PROPOFOL 200 MG/20ML IV EMUL
200 | INTRAVENOUS | Status: DC | PRN
Start: 2022-12-05 — End: 2022-12-05
  Administered 2022-12-05: 13:00:00 150 via INTRAVENOUS

## 2022-12-05 MED ORDER — MAGNESIUM SULFATE 50 % IJ SOLN
50 | INTRAMUSCULAR | Status: AC
Start: 2022-12-05 — End: ?

## 2022-12-05 MED ORDER — DEXAMETHASONE SODIUM PHOSPHATE 20 MG/5ML IJ SOLN
20 | INTRAMUSCULAR | Status: AC
Start: 2022-12-05 — End: ?

## 2022-12-05 MED ORDER — ACETAMINOPHEN 500 MG PO TABS
500 | Freq: Once | ORAL | Status: AC
Start: 2022-12-05 — End: 2022-12-05
  Administered 2022-12-05: 12:00:00 1000 mg via ORAL

## 2022-12-05 MED ORDER — PROPOFOL 200 MG/20ML IV EMUL
200 | INTRAVENOUS | Status: AC
Start: 2022-12-05 — End: ?

## 2022-12-05 MED ORDER — SUCCINYLCHOLINE CHLORIDE 200 MG/10ML IV SOSY
200 | INTRAVENOUS | Status: AC
Start: 2022-12-05 — End: ?

## 2022-12-05 MED ORDER — LIDOCAINE HCL 2 % IJ SOLN
2 | INTRAMUSCULAR | Status: AC
Start: 2022-12-05 — End: ?

## 2022-12-05 MED ORDER — MEPERIDINE HCL 25 MG/ML IJ SOLN
25 | INTRAMUSCULAR | Status: DC | PRN
Start: 2022-12-05 — End: 2022-12-05

## 2022-12-05 MED ORDER — BUPIVACAINE HCL (PF) 0.5 % IJ SOLN
0.5 | INTRAMUSCULAR | Status: AC
Start: 2022-12-05 — End: 2022-12-05
  Administered 2022-12-05: 12:00:00 30 via PERINEURAL

## 2022-12-05 MED ORDER — FENTANYL CITRATE (PF) 100 MCG/2ML IJ SOLN
100 | Freq: Once | INTRAMUSCULAR | Status: AC | PRN
Start: 2022-12-05 — End: 2022-12-05
  Administered 2022-12-05: 12:00:00 50 ug via INTRAVENOUS

## 2022-12-05 MED ORDER — ACETAMINOPHEN 500 MG PO TABS
500 | ORAL | Status: AC
Start: 2022-12-05 — End: ?

## 2022-12-05 MED ORDER — NORMAL SALINE FLUSH 0.9 % IV SOLN
0.9 | Freq: Two times a day (BID) | INTRAVENOUS | Status: DC
Start: 2022-12-05 — End: 2022-12-05

## 2022-12-05 MED ORDER — FENTANYL CITRATE (PF) 100 MCG/2ML IJ SOLN
100 | INTRAMUSCULAR | Status: DC | PRN
Start: 2022-12-05 — End: 2022-12-05
  Administered 2022-12-05: 13:00:00 50 via INTRAVENOUS

## 2022-12-05 MED ORDER — CEFAZOLIN SODIUM 2 G SOLR (MIXTURES ONLY)
2 | INTRAVENOUS | Status: AC
Start: 2022-12-05 — End: 2022-12-05
  Administered 2022-12-05: 13:00:00 2000 mg via INTRAVENOUS

## 2022-12-05 MED ORDER — DEXAMETHASONE SODIUM PHOSPHATE 20 MG/5ML IJ SOLN
20 MG/5ML | INTRAMUSCULAR | Status: DC | PRN
  Administered 2022-12-05: 13:00:00 8 via INTRAVENOUS

## 2022-12-05 MED ORDER — MIDAZOLAM HCL (PF) 2 MG/2ML IJ SOLN
2 | Freq: Once | INTRAMUSCULAR | Status: AC | PRN
Start: 2022-12-05 — End: 2022-12-05
  Administered 2022-12-05: 12:00:00 2 mg via INTRAVENOUS

## 2022-12-05 MED ORDER — NORMAL SALINE FLUSH 0.9 % IV SOLN
0.9 | INTRAVENOUS | Status: DC | PRN
Start: 2022-12-05 — End: 2022-12-05

## 2022-12-05 MED ORDER — LACTATED RINGERS IV SOLN
INTRAVENOUS | Status: DC
Start: 2022-12-05 — End: 2022-12-05
  Administered 2022-12-05: 12:00:00 via INTRAVENOUS

## 2022-12-05 MED ORDER — FENTANYL CITRATE (PF) 100 MCG/2ML IJ SOLN
100 | INTRAMUSCULAR | Status: AC
Start: 2022-12-05 — End: ?

## 2022-12-05 MED ORDER — LACTATED RINGERS IV SOLN
INTRAVENOUS | Status: DC | PRN
Start: 2022-12-05 — End: 2022-12-05
  Administered 2022-12-05: 12:00:00 via INTRAVENOUS

## 2022-12-05 MED ORDER — LIDOCAINE HCL (PF) 1 % IJ SOLN
1 | Freq: Once | INTRAMUSCULAR | Status: DC
Start: 2022-12-05 — End: 2022-12-05

## 2022-12-05 MED ORDER — LISINOPRIL 5 MG PO TABS
5 | Freq: Once | ORAL | Status: AC
Start: 2022-12-05 — End: 2022-12-05
  Administered 2022-12-05: 15:00:00 5 mg via ORAL

## 2022-12-05 MED ORDER — MAGNESIUM SULFATE 50 % IJ SOLN
50 % | INTRAMUSCULAR | Status: DC | PRN
  Administered 2022-12-05: 13:00:00 1 via INTRAVENOUS

## 2022-12-05 MED ORDER — LIDOCAINE HCL 2 % IJ SOLN
2 % | INTRAMUSCULAR | Status: DC | PRN
  Administered 2022-12-05: 13:00:00 100 via INTRAVENOUS

## 2022-12-05 MED ORDER — FENTANYL CITRATE (PF) 250 MCG/5ML IJ SOLN
250 | INTRAMUSCULAR | Status: DC | PRN
Start: 2022-12-05 — End: 2022-12-05

## 2022-12-05 MED ORDER — PHENYLEPHRINE HCL 1 MG/10ML IV SOSY
1 | INTRAVENOUS | Status: AC
Start: 2022-12-05 — End: ?

## 2022-12-05 MED ORDER — LIDOCAINE-EPINEPHRINE 1 %-1:200000 IJ SOLN
1 | Freq: Once | INTRAMUSCULAR | Status: AC | PRN
Start: 2022-12-05 — End: 2022-12-05
  Administered 2022-12-05: 13:00:00 2 via INTRADERMAL

## 2022-12-05 MED ORDER — MIDAZOLAM HCL 2 MG/2ML IJ SOLN
2 | INTRAMUSCULAR | Status: AC
Start: 2022-12-05 — End: ?

## 2022-12-05 MED ORDER — ONDANSETRON HCL 4 MG/2ML IJ SOLN
4 MG/2ML | INTRAMUSCULAR | Status: DC | PRN
  Administered 2022-12-05: 13:00:00 4 via INTRAVENOUS

## 2022-12-05 MED ORDER — NEOSTIGMINE METHYLSULFATE 5 MG/5ML IV SOSY
5 | INTRAVENOUS | Status: AC
Start: 2022-12-05 — End: ?

## 2022-12-05 MED FILL — XYLOCAINE 2 % IJ SOLN: 2 % | INTRAMUSCULAR | Qty: 10

## 2022-12-05 MED FILL — LISINOPRIL 5 MG PO TABS: 5 MG | ORAL | Qty: 1

## 2022-12-05 MED FILL — DEXAMETHASONE SODIUM PHOSPHATE 20 MG/5ML IJ SOLN: 20 MG/5ML | INTRAMUSCULAR | Qty: 5

## 2022-12-05 MED FILL — FENTANYL CITRATE (PF) 100 MCG/2ML IJ SOLN: 100 MCG/2ML | INTRAMUSCULAR | Qty: 2

## 2022-12-05 MED FILL — MIDAZOLAM HCL 2 MG/2ML IJ SOLN: 2 MG/ML | INTRAMUSCULAR | Qty: 2

## 2022-12-05 MED FILL — ONDANSETRON HCL 4 MG/2ML IJ SOLN: 4 MG/2ML | INTRAMUSCULAR | Qty: 2

## 2022-12-05 MED FILL — PHENYLEPHRINE HCL (PRESSORS) 1 MG/10ML IV SOSY: 1 MG/0ML | INTRAVENOUS | Qty: 10

## 2022-12-05 MED FILL — NEOSTIGMINE METHYLSULFATE 5 MG/5ML IV SOSY: 5 MG/ML | INTRAVENOUS | Qty: 5

## 2022-12-05 MED FILL — DIPRIVAN 200 MG/20ML IV EMUL: 200 MG/20ML | INTRAVENOUS | Qty: 20

## 2022-12-05 MED FILL — ROCURONIUM BROMIDE 50 MG/5ML IV SOLN: 50 MG/5ML | INTRAVENOUS | Qty: 5

## 2022-12-05 MED FILL — ACETAMINOPHEN EXTRA STRENGTH 500 MG PO TABS: 500 MG | ORAL | Qty: 2

## 2022-12-05 MED FILL — SUCCINYLCHOLINE CHLORIDE 200 MG/10ML IV SOSY: 200 MG/10ML | INTRAVENOUS | Qty: 10

## 2022-12-05 MED FILL — MAGNESIUM SULFATE 50 % IJ SOLN: 50 % | INTRAMUSCULAR | Qty: 2

## 2022-12-05 NOTE — Progress Notes (Signed)
Pt arrived from OR to PACU bay 6. Reported received from [facility] staff. Pt arousable to voice. Surgical incisions dressings in place to right foot. Pt on RA, NSR, and VSS. Will continue to monitor.

## 2022-12-05 NOTE — Progress Notes (Signed)
Discharge instructions review with patient and wife. All home medications have been reviewed, pt v/u. Discharge instructions signed. Pt discharged via wheelchair. Pt discharged with discharge paperwork, crutches, urinal and all belongings. Wife taking stable pt home.

## 2022-12-05 NOTE — Progress Notes (Signed)
ABPM BIOMECHANICAL EXAMINATION FORM   [id number]         Date: 12/05/22            Resident: Calvin George, DPM  Shan Levans              Attending: Dr. Belva Chimes, DPM  Sex: male  Age: 62 y.o. Wt: 201      Presenting Complaint:     MUSCULOSKELETAL EVALUATION     Normal       Non-Wt Bearing Assessment          R      L                   450  Internal Hip Rotation (ext)  45   45            450  External Hip Rotation (ext)  45   45       00  Neutral Position of Hip (ext) 0      0            15-200  Malleolar Positon (ext)  17   17       100  Ankle DF (Knee Extended)  10   10            >100  Ankle DF (Knee Flexed)  12   12       200  Heel Inversion   20   10             100  Heel Eversion   10   10        00  STJ Neutral Position   0      0            perp  Forefoot to Rearfoot (1-5)      perp   perp        perp  Forefoot to Rearfoot (2-5)      perp   perp            51m  First Ray Dorsiflexion  514m  62m84m      62mm83mirst Ray Plantarflexion  62mm 106mmm  31m      0mm  F57mt Ray Neutral Position  0mm   028m    68m650  Hallux Dorsiflexion   10   40           >300  Hallux Plantarflexion  10   20               Foot Morphology            Frontal Plane (underline)         Normal Morphology R      L           Varus      Valgus                    Forefoot R      L      R      L          Rearfoot R      L      R      L                       Ankle Morphology (underline)  Normal Morphology  R       L                   Equinus             no     no          Calcaneus   no     no          Varum    no     no          Valgum    no     no         Other     no     no                                   Postural Appraisal (underline)        Head Positon:            Forward Backward Sideward                         Shoulders:           Level    R       L       Dropped   no     no  Forward             yes    yes      Backward   no     no                    Spine:             Scoliosis   no      no       Lordosis   no     no  Kyphosis   no     no      Normal             yes    yes       Pelvis:             Level    R       L        Dropped   no     no       Forward             yes    yes  Backward   no     no           Patella Orientation (underline):                 Medial  Central  Lateral             R       L  R       L  R       L    Knee  Varum  Valgum Flexion Recurvatum     R       L  R       L  R       L  R       L     Tibia  Varum  Valgum     R       L  R       L  Malleolar Position  Internal  External      R       L  R       L            R       L   Neutral Calc. Stance Position (deg) 0     0    Relaxed Calc. Stance Position (deg)  4     4    Quality of Motion (underline)              R      L     Ankle (dorsiflexion)    Normal Limited Painful  Yes   Yes    Ankle (plantarflexion)   Normal Limited Painful  Yes   Yes    STJ (supination)    Normal Limited Painful  Yes   Yes   STJ (pronation)   Normal Limited Painful  Yes   Yes  Hallux (dorsiflexion)    Normal Limited Painful  Yes   Yes   Hallux (plantarflexion)   Normal Limited Painful  Yes   Yes   Lesser Digits (dorsiflexion)    Normal Limited Painful  Yes   Yes   Lesser Digits (dorsiflexion)    Normal Limited Painful  Yes   Yes       Sagittal Plane      R      L  Normal Morphology              yes    yes  Anterior Cavus              no     no  Posterior Cavus              no     no   Cavoadductovarus              no     no  Calcaneovalgus                         no     no  Planovalgus               no     no  Rocker Bottom              no     no  Other                 no     no    Limb Length Inequality (in cm)   R      L   Normal (symmetric)     yes  yes  Structural       N/a  N/a  Combined      N/a  N/a  Functional      N/a  N/a    Gait Analysis (Barefoot Gait Pattern)  (Underline)    Normal Antalgic Apropulsive  Other (e.g. Steppage, Circumducted, Scissor)           R           L  Angle of Gait      7       7  Base of Gait       3        3    Patellar Position     R           L    Contact  Normal     Normal  Midstance               Normal     Normal  Propulsion               Normal     Normal  Swing               Normal     Normal    Heel Position      R           L    Contact                    Normal     Normal  Midstance               Normal     Normal  Propulsion               Normal     Normal  Swing               Normal     Normal    Heel Off (underline)    WNL  Early     Abductory Twist (underline)    Yes  No    Muscle Strength (0-5/5)  R      L    Hip Flexors    5       5  Hip Extensors   5       5  Hip Abductors   5       5  Hip Adductors   5       5  Hip Rotators (internal)        5       5  Hip Rotators (external)  5       5  Gastrocnemius   5       5  Soleus    5       5  Tib. Posterior    5       5  Flex. Hallucis Longus  5       5  Flex. Digitorum Longus  5       5  Flex. Digitorum Brevis  5       5  Tib. Anterior    5       5  Ext. Digitorum Longus  5       5  Ext. Hallucis Longus  5       5  Ext. Digitorum Brevis  5       5  Peroneus Longus   5       5  Peroneus Brevis   5       5    Transverse Plane    R      L  Normal Morphology             yes  yes  Forefoot Adducted             N/a  N/a  Forefoot Abducted             N/a  N/a  Rearfoot Adducted             N/a  N/a    Digital Assessment (underline)  Abducted: R: 1  2  3  4  5 $ L: 1  2  3  4  5   $ Adducted: R: 1  2  3  4  $ 5  L: 1  2  3  4  5  $ Claw Toe:  R: 1  2  3  4  5 $ L: 1  2  3  4  5  $ Ham.Toe:  R: 1  2  3  4  5 $ L: 1  2  3  4  5  $ Mallet Toe  Hallux IP Extensus  R      L     Hallux IP Abductus  R      L       IF PORTION OF EXAM IS DEFERRED, GIVE REASON:  Knee and Tibia orientation normal     ASSESSMENT   Hallux rigidus, right foot  Osteoarthritis, right foot    Plan:  -1st MPJ Fusion, right foot

## 2022-12-05 NOTE — Progress Notes (Signed)
Teaching / education initiated regarding perioperative experience, expectations, and pain management during stay. Patient verbalized understanding.

## 2022-12-05 NOTE — H&P (Cosign Needed)
Podiatric Surgery Same Day Pre-op H&P Update Note  Calvin George     I have examined the patient and reviewed the original history and physical completed by [name], MD on 11/24/22 and find no relevant changes.        The nature of the procedure, possible complications, alternative forms of therapy, post-op course, and post-op goals have been explained to patient/patient family.  All questions have been answered. The consent has been signed.  Patient's surgical site has been marked.       Concha Se, DPM  Podiatric Resident, PGY-3  Pager #: [phone] or Perfect Serve

## 2022-12-05 NOTE — Discharge Instructions (Addendum)
[facility]   [address] [place], [place] [address]  [phone]    Dr. Belva Chimes, DPM                                             Post Operative Instructions    1.  Have Prescriptions filled and take as directed.  All medications should be taken with food or milk.    2.  Keep foot elevated six inches above the level of the heart.  Support feet, legs, and knees with pillows.    3.  KEEP FOOT ELEVATED AS MUCH AS POSSIBLE UNTIL YOUR NEXT VISIT    4.  Place an ice pack on the bandaged site for 15 minutes every hour while awake    5.  Keep dressing clean, dry, and intact.  DO NOT REMOVE DRESSING.  CALL THE OFFICE IF BANDAGE COMES OFF.    6.  For the first 7 days after surgery, take temperature by mouth three times a day.  Call the office if greater than 101F.    7.  Ambulate with non-weight bearing to the right lower extremity with crutches / walker.    8.  All instructions are to be followed until otherwise instructed by your surgeon.    9.  Call our office if you have any concerns or questions which arise.  Our phones are answered 24 hours a day.  ([id number].  Your first post-operative appointment is scheduled, call the office for appointment date and time if not known prior to today.     ANESTHESIA DISCHARGE INSTRUCTIONS    Wear your seatbelt home.  You are under the influence of drugs-do not drink alcohol, drive, operate machinery, make any important decisions or sign any legal documents for 24 hours.  A responsible adult needs to be with you for 24 hours.  You may experience lightheadedness, dizziness, or sleepiness following surgery.  Rest at home today- increase activity as tolerated.  Progress slowly to a regular diet unless your physician has instructed you otherwise. Drink plenty of water.  If persistent nausea and vomiting becomes a problem, call your physician.  Coughing, sore throat and muscle aches are other side effects of anesthesia, and  should improve with time.  Do not drive or operate machinery while taking narcotics.

## 2022-12-05 NOTE — Progress Notes (Signed)
Pt's BP 180/96-pt stated he did not take his home BP medicine today-Dr. Guadlupe Spanish notified-orders received/initiated

## 2022-12-05 NOTE — Progress Notes (Signed)
Timeout completed per protocol prior to anesthesia.  Monitors applied.  O2 on at 2L/NC, pulse ox in place.  Fentanyl and Versed IV given for mild sedation per anesthesia verbal orders.

## 2022-12-05 NOTE — Anesthesia Post-Procedure Evaluation (Signed)
Department of Anesthesiology  Postprocedure Note    Patient: Calvin George  MRN: [id number]  Birthdate: 01/11/1961  Date of evaluation: 12/05/2022    Procedure Summary       Date: 12/05/22 Room / Location: [facility] OR 03 / [facility]    Anesthesia Start: 0730 Anesthesia Stop: 0901    Procedure: FIRST METATARSOPHALANGEAL JOINT ARTHRODESIS-RIGHT FOOT; APPLICATION BELOW KNEE SPLINT-RIGHT LOWER LEG (Right: Foot) Diagnosis:       Secondary osteoarthritis of foot, right      Hallux rigidus of right foot      (Secondary osteoarthritis of foot, right [M19.271])      (Hallux rigidus of right foot [M20.21])    Surgeons: Belva Chimes, DPM Responsible Provider: Milana Kidney, MD    Anesthesia Type: general ASA Status: 2            Anesthesia Type: No value filed.    Aldrete Phase I: Aldrete Score: 10    Aldrete Phase Caria Transue: Aldrete Score: 10    Anesthesia Post Evaluation    Patient location during evaluation: PACU  Patient participation: complete - patient participated  Level of consciousness: awake and alert  Pain score: 2  Airway patency: patent  Nausea & Vomiting: no nausea  Cardiovascular status: blood pressure returned to baseline  Respiratory status: acceptable  Hydration status: euvolemic  Comments: Hypertensive post op. Given held home meds in recovery.   Multimodal analgesia pain management approach  Pain management: adequate    No notable events documented.

## 2022-12-05 NOTE — Op Note (Cosign Needed)
Operative Note      Patient: Calvin George  Date of Birth: [birth year]  MRN: [id number]    Date of Procedure: 2023/01/04    Pre-Op Diagnosis Codes:     * Secondary osteoarthritis of foot, right [M19.271]     * Hallux rigidus of right foot [M20.21]    Procedure(s):  [phone] - FIRST METATARSOPHALANGEAL JOINT ARTHRODESIS, RIGHT FOOT  76000 - FLUOROSCOPY, RIGHT FOOT  [phone] - APPLICATION BELOW KNEE SPLINT, RIGHT LOWER LEG     Surgeon(s):  Carron Curie, DPM     Assistant:  Resident: Concha Se, PGY-III  Resident: Jamison Neighbor, PGY-II  Student: Sharen Counter, MS-IV     Hemostasis: Pneumatic Calf Tourniquet @ 250 mmHg (60 minutes), Electrocautery, and Anatomic Dissection      Injectables: Pre-Op 2 cc of 1% Lidocaine with Epinephrine     Materials: 3-0/4-0 Vicryl and 4-0 Monocryl     Achorage2  1x MTP plate CP plate  1x 2.9F62 mm lag screw  2x 2.7x30mm locking screw  2x 2.7x70mm locking screw     1.5 cc Augment   1x 4x4cm SteriShield     Anesthesia: General with Pre-Operative Popliteal Block     Estimated Blood Loss (mL): Minimal     Complications: None    Specimens:   * No specimens in log *    Implants:  Implant Name Type Inv. Item Serial No. Manufacturer Lot No. LRB No. Used Action   GRAFT BONE TIB INJ 1.5CC ALLOGRFT AUGMENT - [id number]  GRAFT BONE TIB INJ 1.5CC ALLOGRFT AUGMENT [id number] [facility] [employer]-WD [id number] Right 1 Implanted   PATCH AMNION 2 LAYR PROTCT 4 X 4CM STERISHIELD II - [id number]  PATCH AMNION 2 LAYR PROTCT 4 X 4CM STERISHIELD II [id number] [employer] ALLOGRAFTS-WD  Right 1 Implanted   PLATE MTP CROSS PA LCK LT T8 - [id number] Screw/Plate/Nail/Rod PLATE MTP CROSS PA LCK LT T8  STRYKER: ORTHOPAEDICS-PMM  Right 1 Implanted   SCREW BONE L14MM DIA2.7MM WR TI ALLOY LCK FULL THRD T8 DRV - [id number]  SCREW BONE L14MM DIA2.7MM WR TI ALLOY LCK FULL THRD T8 DRV  STRYKER ORTHOPEDICS HOWM-WD  Right 2 Implanted   SCREW BNE L16MM DIA2.7MM WRST TI ALLY LOK FULL THRD T8 DRV - [id number]  SCREW BNE  L16MM DIA2.7MM WRST TI ALLY LOK FULL THRD T8 DRV  STRYKER ORTHOPEDICS HOWM-WD  Right 2 Implanted   SCREW CP LAG 36X28MM - [id number]  SCREW CP LAG 36X28MM  STRYKER ORTHOPEDICS HOWM-WD  Right 1 Implanted         Drains: * No LDAs found *    Findings: Significant arthritis noted within 1st MPJ with no articular cartilage present at the 1st metatarsal head.      Detailed Description of Procedure:     INDICATIONS FOR PROCEDURE: This patient has signs and symptoms clinically and radiographically consistent with the above mentioned preoperative diagnosis. Having failed conservative treatment, it was determined that the patient would benefit from surgical intervention. All potential risks, benefits, and complications were discussed with the patient prior to the scheduling of surgery. All the patient's questions were answered and no guarantees were given. The patient wished to proceed with surgery, and informed written consent was obtained.     DETAILS OF PROCEDURE: In the pre-operative area, the patient received a regional popliteal block to the right lower extremity performed by the anesthesiologist. The patient was then brought from the pre-operative area and placed on the operating table in the supine position. A  pneumatic calf tourniquet was placed around the patient's well-padded right lower extremity. At this time, a skin marker was used to outline an incision over the dorsal medial aspect of the right foot. Following IV sedation, 2 cc of 1 % lidocaine with epinephrine was injected subdermally along the planned out incision. The right lower extremity was then scrubbed, prepped, and draped in the usual sterile fashion. A time-out was performed. The patient, procedure, and operative site were confirmed. An Esmarch bandage was then utilized to exsanguinate the patient's right lower extremity. The tourniquet was then inflated to 250 mmHg and the following procedure was performed.    PROCEDURE #1 (71696): FIRST  METATARSOPHALANGEAL JOINT ARTHRODESIS, RIGHT FOOT     At this time, attention was directed to the dorsal aspect of the patient's right foot. Using a #15 blade, a 6 cm curvilinear incision was made over the dorsal aspect of the first metatarsal, centered over the first metatarsophalangeal joint. Using sharp and blunt dissection the incision was deepened through the subcutaneous layer with care being taken to identify and retract all vital neurovascular structures. All venous tributaries were electrocoagulated as necessary. Sharp and blunt dissection was continued to the level of the joint capsule. At this time, a linear capsulotomy was performed over the dorsal aspect of the first metatarsophalangeal joint. The periosteal and capsular structures were then carefully dissected free of their osseous attachments and reflected medially and laterally thereby exposing the head of the first metatarsal and the base of the proximal phalanx.     Next, using the Stryker Reamer set, a guide wire was placed through the head of the first metatarsal and down the medullary canal. Fluoroscopy was utilized to confirm adequate placement. Overtop the guidewire, a 20 mm sized concave reamer was placed and the 1st metatarsal head was reamed until all articular cartilage was removed. A #38 blade was used to resect any overhang from the reamed site. The guide wire was removed. The guide wire was then placed through the proximal phalanx and down the medullary canal. Fluoroscopy was utilized to confirm adequate placement. Overtop the guidewire, a 20 mm sized convex reamer was placed and the proximal phalangeal base was reamed until all articular cartilage was removed. A #38 blade was used to resect any overhang from the reamed site. The joint was now flushed with copious amounts of sterile saline. The guide wire was removed and used to fenestrate the joint surfaces.     Next, the hallux was then aligned on the 1st metatarsal head with the  distal pulp of the toe just off the weight-bearing surface, approximately 15 degrees of dorsiflexion from the ground, and parallel to the 2nd toe with no frontal plane rotation. This position was temporarily fixated using a K-wire. C-arm fluoroscopy revealed excellent alignment of the 1st metatarsophalangeal joint and position of the arthrodesis.    Next, a Stryker Anchorage2 cannulated first metatarsal phalangeal joint locking plate was placed on the dorsal aspect of the joint. The plate was secured to the underlying bony surfaces using various 2.7 mm locking screws and a 3.6x28 mm lag screw. Excellent stabilization was noted. Proper plate position was confirmed using live intraoperative fluoroscopy.The temporary fixation was then removed and the area was irrigated.  A copious amount of normal sterile saline was used to flush the surgical site. 3-0 vicryl was used to close the deep subcutaneous tissues. A 4x4 cm Sterishield was then placed along the incision to help decrease scarring and adhesions along the incision site. The  4-0 vicryl was use to close the more superficial subcutaneous tissues and 4-0 monocryl was used to close the skin in a running intradermal technique with the free ends of the 4-0 vicryl and 4-0 monocryl being tied outside the skin at either end of the incision.     PROCEDURE #2 (76000): FLUOROSCOPY, RIGHT FOOT     During today's procedure intraoperative fluoroscopy was utilized for all portions of the case (1st metatarsophalangeal joint arthrodesis). Intraoperative fluoroscopy was used in multiple views. For example, a AP/lateral views were used make sure the joint was in proper position when inserting the k-wire prior to reaming of the joint surfaces. Furthermore, intra-operative fluoroscopy was used throughout the procedure to confirm proper placement and length of hardware  and check bony alignment.  Intra-operative fluoroscopy was used for a total of 0.7 minutes in today's case.   Intraoperative images are available upon request.    PROCEDURE #3 (74259) : APPLICATION OF BELOW KNEE SPLINT, RIGHT LOWER LEG    The incision site was then dressed with Dermabond, Steri-strips, adaptic, dry gauze, Kling, Cast padding, and ACE. The pneumatic calf tourniquet was rapidly deflated after a total time of 60 minutes and a prompt hyperemic response was noted on all aspects of the patient's right lower extremity. Next copious rolls of Cast padding were applied to the patients right lower extremity from the metatarsal heads to approximately 3 finger breaths distal to the head and neck of the fibula. Next, splint material was moistened, and a posterior splint was applied from the plantar foot posteriorly up the leg to approximately the mid calf area. 6 inch Double ACE was then applied from distal to proximal to the secure the splint in place; in addition, to preventing some post-operative swelling. At this time, the foot was then dorsiflexed to prevent acquired equinus deformity and held in place until the splint was hardened.     END OF PROCEDURE: The patient tolerated the procedure and anesthesia well and was transported from the operating room to the PACU with vital signs stable and vascular status intact to all aspects of the patient's right lower extremity and digital capillary refill time immediate to the digits of the right foot. Following a period of post-operative monitoring, the patient will be discharged home with written and oral wound care and follow-up instructions. The patient was provided with prescriptions pre-operatively in Dr. Harmon Dun private office. The patient is to follow-up with Dr. Duwaine Maxin in his private office within 5-7 days. The patient is to keep dressing clean, dry and intact at all times. The patient is to call if any complications occur.    This operative report was dictated on behalf of Dr. Carron Curie, DPM.    Concha Se, DPM  Podiatric Resident, PGY-3  Pager #: [phone] or Perfect Serve    Electronically signed by Concha Se, DPM on 12/05/2022 at 8:58 AM

## 2022-12-05 NOTE — Anesthesia Procedure Notes (Signed)
Peripheral Block    Patient location during procedure: holding area  Reason for block: post-op pain management and at surgeon's request  Start time: 12/05/2022 7:09 AM  End time: 12/05/2022 7:10 AM  Staffing  Performed: anesthesiologist   Anesthesiologist: Milana Kidney, MD  Performed by: Milana Kidney, MD  Authorized by: Milana Kidney, MD    Preanesthetic Checklist  Completed: patient identified, IV checked, site marked, risks and benefits discussed, surgical/procedural consents, equipment checked, pre-op evaluation, timeout performed, anesthesia consent given, oxygen available, monitors applied/VS acknowledged, fire risk safety assessment completed and verbalized and blood product R/B/A discussed and consented  Peripheral Block   Patient position: supine  Prep: ChloraPrep  Provider prep: mask and sterile gloves  Patient monitoring: cardiac monitor, continuous pulse ox, continuous capnometry, frequent blood pressure checks, IV access, oxygen and responsive to questions  Block type: Femoral  Adductor canal  Laterality: right  Injection technique: single-shot  Guidance: ultrasound guided    Needle   Needle type: insulated echogenic nerve stimulator needle   Needle gauge: 22 G  Needle localization: anatomical landmarks and ultrasound guidance  Needle insertion depth: 6 cm  Test dose: negative  Needle length: 10 cm  Assessment   Injection assessment: negative aspiration for heme, no paresthesia on injection, local visualized surrounding nerve on ultrasound and no intravascular symptoms  Paresthesia pain: none  Slow fractionated injection: yes  Hemodynamics: stable  Real-time [place] image taken/store: yes  Outcomes: uncomplicated and patient tolerated procedure well    Medications Administered  BUPivacaine (MARCAINE) PF injection 0.5% - Perineural   30 mL - 12/05/2022 7:09:00 AM

## 2022-12-05 NOTE — Progress Notes (Signed)
Nerve block procedure completed.  Patient tolerated well.   BP 133/73.  P69.  SaO2 96%.  Wife returned to bedside.

## 2022-12-05 NOTE — Brief Op Note (Signed)
Brief Postoperative Note      Patient: Calvin George  Date of Birth: 07/15/1961  MRN: [id number]    Date of Procedure: 08-Dec-2022    Pre-Op Diagnosis Codes:     * Secondary osteoarthritis of foot, right [M19.271]     * Hallux rigidus of right foot [M20.21]    Post-Op Diagnosis: Same       Procedure(s):  28750 - FIRST METATARSOPHALANGEAL JOINT ARTHRODESIS, RIGHT FOOT  76000 - FLUOROSCOPY, RIGHT FOOT  [id number] - APPLICATION BELOW KNEE SPLINT, RIGHT LOWER LEG    Surgeon(s):  Belva Chimes, DPM    Assistant:  Resident: Percell Boston, PGY-III  Resident: Judi Cong, PGY-II  Student: Carolynn Serve, MS-IV    Hemostasis: Pneumatic Calf Tourniquet @ 250 mmHg (60 minutes), Electrocautery, and Anatomic Dissection     Injectables: Pre-Op 2 cc of 1% Lidocaine with Epinephrine    Materials: 3-0/4-0 Vicryl and 4-0 Monocryl    Achorage2  1x MTP plate CP plate  1x 075-GRM mm lag screw  2x 2.7x42m locking screw  2x 2.7x147mlocking screw    1.5 cc Augment   1x 4x4cm SteriShield    Anesthesia: General with Pre-Operative Popliteal Block    Estimated Blood Loss (mL): Minimal    Complications: None    Specimens:   * No specimens in log *    Implants:  Implant Name Type Inv. Item Serial No. Manufacturer Lot No. LRB No. Used Action   GRAFT BONE TIB INJ 1.5CC ALLOGRFT AUGMENT - [id number] BONE TIB INJ 1.5CC ALLOGRFT AUGMENT [id number] SurgeryEDICAL TECHNOLOGY INC-WD [id number] 1 Implanted   PATCH AMNION 2 LAYR PROTCT 4 X 4CM STERISHIELD II - [id number] AMNION 2 LAYR PROTCT 4 X 4CM STERISHIELD II [id number] ALLOGRAFTS-WD  Right 1 Implanted   PLATE MTP CROSS PA LCK LT T8 - [id number]/Plate/Nail/Rod PLATE MTP CROSS PA LCK LT T8  STRYKER: ORTHOPAEDICS-PMM  Right 1 Implanted   SCREW BONE L14MM DIA2.7MM WR TI ALLOY LCK FULL THRD T8 DRV - [id number] BONE L14MM DIA2.7MM WR TI ALLOY LCK FULL THRD T8 DRV  STRYKER ORTHOPEDICS HOWM-WD  Right 2 Implanted   SCREW BNE L16MM DIA2.7MM WRST TI ALLY LOK FULL THRD  T8 DRV - [id number] BNE L16MM DIA2.7MM WRST TI ALLY LOK FULL THRD T8 DRV  STRYKER ORTHOPEDICS HOWM-WD  Right 2 Implanted   SCREW CP LAG 36X28MM - [id number] CP LAG 36X28MM  STRYKER ORTHOPEDICS HOWM-WD  Right 1 Implanted         Drains: * No LDAs found *    Findings: Significant arthritis noted within 1st MPJ with no articular cartilage present at the 1st metatarsal head.     Electronically signed by AaPercell BostonDPM on 11/18/20/24t 8:54 AM

## 2022-12-05 NOTE — Progress Notes (Signed)
Pt awake and alert at this time. Pt on RA, and VSS. Pt denies pain and nausea, tolerating PO.  Skin warm. Pt meets criteria to be discharged from Phase 1.

## 2022-12-05 NOTE — Anesthesia Pre-Procedure Evaluation (Addendum)
Department of Anesthesiology  Preprocedure Note       Name:  Calvin George   Age:  62 y.o.  DOB:  January 21, 1961                                          MRN:  [id number]         Date:  12/05/2022      Surgeon: Juliann Mule):  Belva Chimes, DPM    Procedure: Procedure(s):  FIRST METATARSOPHALANGEAL JOINT ARTHRODESIS-RIGHT FOOT; APPLICATION BELOW KNEE SPLINT-RIGHT LOWER LEG    Medications prior to admission:   Prior to Admission medications    Medication Sig Start Date End Date Taking? Authorizing Provider   lisinopril (PRINIVIL;ZESTRIL) 5 MG tablet Take 1 tablet by mouth daily 11/16/22  Yes [provider]   tadalafil (CIALIS) 5 MG tablet Take 1 tablet by mouth daily 11/14/22  Yes [provider]   pregabalin (LYRICA) 150 MG capsule Take 1 capsule by mouth 2 times daily. 09/30/22 12/29/22 Yes [provider]   atorvastatin (LIPITOR) 20 MG tablet Take 1 tablet by mouth daily 08/18/22  Yes [provider]       Current medications:    Current Facility-Administered Medications   Medication Dose Route Frequency Provider Last Rate Last Admin   . ceFAZolin (ANCEF) 2,000 mg in sodium chloride 0.9 % 50 mL IVPB (mini-bag)  2,000 mg IntraVENous On Call to [place], Christopher, DPM       . lactated ringers IV soln infusion   IntraVENous Continuous Belva Chimes, DPM 50 mL/hr at 12/05/22 0653 New Bag at 12/05/22 [id number]   . lidocaine PF 1 % injection 0.5 mL  0.5 mL IntraDERmal Once C[place]um, Harrell Gave, DPM           Allergies:  No Known Allergies    Problem List:  There is no problem list on file for this patient.      Past Medical History:        Diagnosis Date   . Hyperlipidemia    . Hypertension        Past Surgical History:        Procedure Laterality Date   . CERVICAL FUSION      2003   . COLONOSCOPY         Social History:    Social History     Tobacco Use   . Smoking status: Unknown   . Smokeless tobacco: Not on file   Substance Use Topics   . Alcohol use: Not Currently                                 Counseling given: Not Answered      Vital Signs (Current):   Vitals:    11/30/22 1427   Weight: 89.4 kg (197 lb)   Height: 1.803 m (5' 11"$ )                                              BP Readings from Last 3 Encounters:   No data found for BP       NPO Status: Time of last liquid consumption: 2300  Time of last solid consumption: 2030                        Date of last liquid consumption: 12/04/22                        Date of last solid food consumption: 12/05/22    BMI:   Wt Readings from Last 3 Encounters:   11/30/22 89.4 kg (197 lb)     Body mass index is 27.48 kg/m.    CBC: No results found for: "WBC", "RBC", "HGB", "HCT", "MCV", "RDW", "PLT"    CMP: No results found for: "NA", "K", "CL", "CO2", "BUN", "CREATININE", "GFRAA", "AGRATIO", "LABGLOM", "GLUCOSE", "GLU", "PROT", "CALCIUM", "BILITOT", "ALKPHOS", "AST", "ALT"    POC Tests: No results for input(s): "POCGLU", "POCNA", "POCK", "POCCL", "POCBUN", "POCHEMO", "POCHCT" in the last 72 hours.    Coags: No results found for: "PROTIME", "INR", "APTT"    HCG (If Applicable): No results found for: "PREGTESTUR", "PREGSERUM", "HCG", "HCGQUANT"     ABGs: No results found for: "PHART", "PO2ART", "PCO2ART", "HCO3ART", "BEART", "O2SATART"     Type & Screen (If Applicable):  No results found for: "LABABO", "LABRH"    Drug/Infectious Status (If Applicable):  No results found for: "HIV", "HEPCAB"    COVID-19 Screening (If Applicable): No results found for: "COVID19"        Anesthesia Evaluation  Patient summary reviewed and Nursing notes reviewed  Airway: Mallampati: Verlyn Lambert  TM distance: >3 FB   Neck ROM: full  Mouth opening: > = 3 FB   Dental:          Pulmonary:Negative Pulmonary ROS and normal exam                               Cardiovascular:  Exercise tolerance: good (>4 METS)  (+) hypertension:, hyperlipidemia        Rhythm: regular  Rate: normal                    Neuro/Psych:                ROS comment: Cervical neck fusion.   GI/Hepatic/Renal:        (-) hiatal hernia, liver disease and no renal disease       Endo/Other:        (-) diabetes mellitus, hypothyroidism, hyperthyroidism               Abdominal:             Vascular:          Other Findings:       Anesthesia Plan      general     ASA 2       Induction: intravenous.    MIPS: Postoperative opioids intended and Prophylactic antiemetics administered.  Anesthetic plan and risks discussed with patient.      Plan discussed with CRNA.    Attending anesthesiologist reviewed and agrees with Preprocedure content      Post-op pain plan if not by surgeon: single peripheral nerve block        Pryor Curia, Jaleal Schliep, MD   12/05/2022

## 2022-12-27 ENCOUNTER — Inpatient Hospital Stay
Admit: 2022-12-27 | Discharge: 2022-12-27 | Disposition: A | Discharge status: Home / Self Care | Payer: Medicare (Managed Care)

## 2022-12-27 DIAGNOSIS — K640 First degree hemorrhoids: Secondary | ICD-10-CM

## 2022-12-27 DIAGNOSIS — K625 Hemorrhage of anus and rectum: Secondary | ICD-10-CM

## 2022-12-27 LAB — BASIC METABOLIC PANEL
Anion Gap: 6 mmol/L (ref 3–16)
BUN: 8 mg/dL (ref 7–25)
CO2: 28 mmol/L (ref 21–33)
Calcium: 9.2 mg/dL (ref 8.6–10.3)
Chloride: 105 mmol/L (ref 98–110)
Creatinine: 0.85 mg/dL (ref 0.60–1.30)
EGFR: >90
Glucose: 124 mg/dL — ABNORMAL HIGH (ref 70–100)
Osmolality, Calculated: 288 mOsm/kg (ref 278–305)
Potassium: 4.4 mmol/L (ref 3.5–5.3)
Sodium: 139 mmol/L (ref 133–146)

## 2022-12-27 LAB — HEPATIC FUNCTION PANEL
ALT: 28 U/L (ref 7–52)
AST: 31 U/L (ref 13–39)
Albumin: 4.4 g/dL (ref 3.5–5.7)
Alkaline Phosphatase: 74 U/L (ref 36–125)
Bilirubin, Direct: 0.1 mg/dL (ref 0.0–0.4)
Bilirubin, Indirect: 0.5 mg/dL (ref 0.0–1.1)
Total Bilirubin: 0.6 mg/dL (ref 0.0–1.5)
Total Protein: 7.8 g/dL (ref 6.4–8.9)

## 2022-12-27 LAB — CBC
Hematocrit: 36.7 % — ABNORMAL LOW (ref 38.5–50.0)
Hemoglobin: 11.7 g/dL — ABNORMAL LOW (ref 13.2–17.1)
MCH: 27.4 pg (ref 27.0–33.0)
MCHC: 31.8 g/dL — ABNORMAL LOW (ref 32.0–36.0)
MCV: 86 fL (ref 80.0–100.0)
MPV: 9.6 fL (ref 7.5–11.5)
Platelets: 183 10*3/uL (ref 140–400)
RBC: 4.26 10*6/uL (ref 4.20–5.80)
RDW: 24.8 % — ABNORMAL HIGH (ref 11.0–15.0)
WBC: 9.3 10*3/uL (ref 3.8–10.8)

## 2022-12-27 LAB — PROTIME-INR
INR: 1 (ref 0.9–1.1)
Protime: 13.4 seconds (ref 12.1–15.1)

## 2022-12-27 LAB — DIFFERENTIAL
Basophils Absolute: 56 /uL (ref 0–200)
Basophils Relative: 0.6 % (ref 0.0–1.0)
Eosinophils Absolute: 121 /uL (ref 15–500)
Eosinophils Relative: 1.3 % (ref 0.0–8.0)
Lymphocytes Absolute: 1395 /uL (ref 850–3900)
Lymphocytes Relative: 15 % (ref 15.0–45.0)
Monocytes Absolute: 484 /uL (ref 200–950)
Monocytes Relative: 5.2 % (ref 0.0–12.0)
Neutrophils Absolute: 7245 /uL (ref 1500–7800)
Neutrophils Relative: 77.9 % (ref 40.0–80.0)
nRBC: 0 /100 WBC (ref 0–0)

## 2022-12-27 LAB — MAGNESIUM: Magnesium: 2 mg/dL (ref 1.5–2.5)

## 2022-12-27 LAB — APTT: aPTT: 28.3 seconds (ref 25.5–35.0)

## 2022-12-27 MED ORDER — docusate sodium (COLACE) 100 MG capsule
100 | ORAL_CAPSULE | Freq: Two times a day (BID) | ORAL | 0 refills | Status: AC | PRN
Start: 2022-12-27 — End: ?

## 2022-12-27 MED ORDER — hydrocortisone (PREPARATION H HYDROCORTISONE) 1 % cream
1 | Freq: Two times a day (BID) | TOPICAL | 0 refills | 15.00000 days | Status: AC
Start: 2022-12-27 — End: ?

## 2022-12-27 NOTE — ED Notes (Signed)
Patient stable for discharge. After visit summary reviewed with patient. Patient has no questions at this time.

## 2022-12-27 NOTE — ED Triage Notes (Signed)
Patient arrives to ED via EMS from home with complaints of bright red rectal bleeding since midnight. Patient denies any other symptoms including CP, SOB, weakness. GCS 15.

## 2022-12-27 NOTE — ED Notes (Signed)
Bed: C19W  Expected date:   Expected time:   Means of arrival:   Comments:  [place], 62 y/o M, from home, c/o rectal bleeding since midnight, VSS

## 2022-12-27 NOTE — Discharge Instructions (Addendum)
You were seen in the emergency department today for bright red bleeding from your rectum which I suspect is most likely due to an internal hemorrhoid that is not causing pain.  You were very well-appearing on evaluation today and your examination was reassuring with a palpable internal and external hemorrhoid but no evidence of significant bleeding.  Your laboratory studies were also reassuring and I have low suspicion for a dangerous cause of your bright red blood per rectum.  Please continue to drink lots of water, eat plenty of fiber, and use the prescribed Colace and Preparation H for stool softening and treatment of your hemorrhoids respectively.  Please follow-up with your primary care doctor as well as your gastroenterologist in the next few weeks to schedule your outpatient screening colonoscopy.  If you develop any worsening bleeding, abdominal pain, lightheadedness or dizziness please return to the emergency department

## 2022-12-27 NOTE — ED Provider Notes (Signed)
ED Attending Attestation Note    Date of service:  12/27/2022    This patient was seen by the resident physician.  I have seen and examined the patient, agree with the workup, evaluation, management and diagnosis. The care plan has been discussed and I concur.    My assessment reveals a 62 y.o. male presented to the emergency department for evaluation of rectal bleeding.  The patient symptoms began around midnight patient presents to the emergency department for evaluation and assessment for rectal bleeding

## 2022-12-27 NOTE — ED Provider Notes (Signed)
ED Note    Date of service:  12/27/2022  Reason for Visit: Rectal Bleeding      Patient History     HPI:  Mitchell Evans is a pleasant 62 y.o. male with history of hyperlipidemia, normocytic anemia, HTN who presents with rectal bleeding.     Patient states while having a bowel movement this morning he noted bright red blood in the toilet bowl that was partially mixed into the stool.  He states he did not have any pain with defecation and has not had any abdominal pain, recent constipation, nausea or vomiting, fevers or chills.  Patient reports 1 past episode of bright red blood per rectum and states at that time he was diagnosed with diverticulosis but has never had an episode of diverticulitis.  It is difficult to ascertain the true history of the patient's diverticulosis diagnosis as he is partially a poor historian and frequently crosses details.  He states he is not taking anticoagulation at this time and has had no recent back pain, urinary symptoms, or any other concerning findings.    The patient denies any aggravating or alleviating factors or associated symptoms other than discussed above.     Past Medical History:   Diagnosis Date    Essential hypertension     Hepatitis C 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 08/16/2021    Needs confirmed    Hyperlipidemia     Low serum HDL 08/16/2021    [name] (left ventricular hypertrophy) 11/28/2022    Nonrheumatic mitral valve regurgitation 11/28/2022    Normocytic anemia 11/09/2021    Prediabetes 08/16/2021    Spinal stenosis of cervical region      Past Surgical History:   Procedure Laterality Date    [place] SURGERY  2003     Patient  reports that he has never smoked. He has never used smokeless tobacco. He reports that he does not currently use alcohol. He reports that he does not currently use drugs after having used the following drugs: Marijuana and Medical Marijuana.    Previous  Medications    ATORVASTATIN (LIPITOR) 20 MG TABLET    Take 1 tablet (20 mg total) by mouth daily.    AZELASTINE (ASTELIN) 137 MCG (0.1 %) NASAL SPRAY    Use 1 spray into each nostril 2 times a day. Use in each nostril as directed    CHOLECALCIFEROL, VITAMIN D3, 125 MCG (5,000 UNIT) TAB    Take by mouth.    FERROUS SULFATE 325 (65 FE) MG TABLET    Take 1 tablet (325 mg total) by mouth daily with breakfast.    LIDOCAINE (LIDODERM) 5 %    Place 1 patch onto the skin daily. Apply patch for 12 hours and then remove patch and leave off for 12 hours.    LISINOPRIL (PRINIVIL) 5 MG TABLET    Take 1 tablet (5 mg total) by mouth daily. NEW LOWER DOSAGE    MULTIVITAMIN CAPSULE    Take 1 capsule by mouth daily.    POLYETHYLENE GLYCOL (GOLYTELY) 236-22.74-6.74 -5.86 GRAM SOLUTION    To Prep for colonoscopy. Drink 8oz every 20-30 minutes until the entire bottle is consumed.    PREGABALIN (LYRICA) 150 MG CAPSULE    Take 1 capsule (150 mg total) by mouth 2 times a day.    TADALAFIL (CIALIS) 5 MG TABLET    Take 1 tablet (5 mg total) by mouth daily. USE  GOOD RX     Allergies:   Allergies as of 12/27/2022    (No Known Allergies)       Review of Systems     A comprehensive review of systems was performed and negative except as noted previously in the HPI.    Physical Exam     Vitals:    12/27/22 0940 12/27/22 0944   BP: 178/86    BP Location: Right upper arm    Patient Position: Sitting    BP Cuff Size: Regular    Pulse: 73    Resp: 18    Temp:  99.1 F (37.3 C)   TempSrc:  Oral   SpO2: 100%    Weight: 201 lb (91.2 kg)    Height: 5' 11 (1.803 m)      General: Overall well-appearing.  Head: Normocephalic/atraumatic.  Eyes: Anicteric. Pupils reactive. No discharge from eyes.  ENT: No discharge from nose. OP clear.  Neck: Supple, trachea midline.  Pulmonary: Non-labored breathing. Breath sounds clear bilaterally.  Cardiac: Regular rate and rhythm. No murmurs.  Abdomen: Soft. Non-distended. Non-tender.  Rectal: Nontender external  hemorrhoids noted.  Bright red blood noted on DRE without tenderness  Musculoskeletal: No long bone deformity.  Skin: Dry, no rashes.  Extremities: Warm and well perfused. No peripheral edema.  Neuro: A&O x3. Moves all four extremities to command. Sensation grossly intact to light touch. No focal deficit.  Psych: Mood and affect appropriate for situation.    Diagnostic Studies     Labs:  Labs Reviewed   BASIC METABOLIC PANEL - Abnormal; Notable for the following components:       Result Value    Glucose 124 (*)     All other components within normal limits   CBC - Abnormal; Notable for the following components:    Hemoglobin 11.7 (*)     Hematocrit 36.7 (*)     MCHC 31.8 (*)     RDW 24.8 (*)     All other components within normal limits   DIFFERENTIAL   HEPATIC FUNCTION PANEL   MAGNESIUM   PROTIME-INR   APTT     Radiology:  No orders to display     POCUS:  No bedside ultrasound performed.    Emergency Department Procedures     Procedures    ED Course and MDM     Mitchell Evans is a 62 y.o. male with a history and presentation as described above in HPI.  The patient was evaluated by myself, the ED Attending Physician, Dr. Daune Perch, MD. All management and disposition plans were discussed and agreed upon. Appropriate labs and diagnostic studies were reviewed as they were made available. Pertinent laboratory studies in medical decision making are listed below.     Upon presentation, the patient was well-appearing, afebrile, and hemodynamically stable.    Patient presents for primary blood per rectum beginning this morning just after midnight that is not associated with any abdominal pain, nausea or vomiting, pain with defecation or recent constipation.  He has bright red blood noted on his rectal examination but is nontender and has evidence of nonthrombosed external hemorrhoid.  Patient does have palpable internal hemorrhoid which is considered to be most likely the cause of the patient's bleeding at this time.   Additionally considered the possibility of diverticulosis however his hemoglobin is stable at this time at 11.7 without evidence of profound drop in his blood counts secondary to reported rectal bleeding.  I do not believe he requires  a CT of his abdomen at this time for evaluation of possible diverticular source and can follow-up with his primary care physician as well as gastroenterology if needed for any persistent rectal bleeding.    The patient's laboratory studies today are reassuring and he has no evidence of a significant lower GI bleed.  He has an Oaklyn score of 11 however his hemoglobin is baseline therefore I have no significant Ste[place] for a worsening bleed and do not believe he requires admission at this time.  The patient had a scheduled colonoscopy November 15, 2022 but was unable to attend and is working on rescheduling his colonoscopy for evaluation.  I will discharge him with advised to follow-up with PCP as well as a referral to GI for use if needed and to help reschedule his colonoscopy.  He is advised to continue drinking plenty of water, eating plenty of fiber, and avoiding trigger foods.  Otherwise provided my standard discharge instructions and return precautions to the emergency department.    Medical Decision Making  Amount and/or Complexity of Data Reviewed  Labs: ordered.          Medications received during this ED visit:  Medications - No data to display  Consults:  None    Impression     1. BRBPR (bright red blood per rectum)    2. Grade I hemorrhoids      Plan     At this time, the patient was deemed appropriate for discharge. My customary discharge instructions, including strict return precautions for new or worsening symptoms concerning to the patient, were provided. All of patient's questions were answered satisfactorily, and she was subsequently sent home in stable condition.    1. The patient is to be discharged home in stable/improved condition.  2. Workup, treatment and  diagnosis were discussed with the patient and/or family members; the patient agrees to the plan and all questions were addressed and answered.  3. The patient is instructed to return to the emergency department should his symptoms worsen or any concern he believes warrants acute physician evaluation.  4. Patient instructed to follow-up with their primary care physician within 1 week and gastroenterology for recheck of symptoms.      Durel Salts, MD  PGY-3 Emergency [place]    This note was dictated using voice-recognition software, which occasionally leads to inadvertent typographic errors.       Durel Salts, MD  Resident  12/27/22 [phone]

## 2022-12-30 NOTE — Telephone Encounter (Signed)
11/24/2022 [name], MD  Visit date not found

## 2023-01-02 MED ORDER — tadalafiL (CIALIS) 5 MG tablet
5 | ORAL_TABLET | Freq: Every day | ORAL | 3 refills | Status: AC
Start: 2023-01-02 — End: 2023-03-16

## 2023-01-10 NOTE — Telephone Encounter (Signed)
OK to wait for provider since narcotic rx filled from another provider

## 2023-01-10 NOTE — Telephone Encounter (Signed)
[name] was pulled, last filled 09/20/22 90 day supply.  11/24/2022 [name], MD  Visit date not found  Oxycodone-Acetaminophn was filled 12/02/22  5 day supply [name], DPM

## 2023-01-12 MED ORDER — pregabalin (LYRICA) 150 MG capsule
150 | ORAL_CAPSULE | Freq: Two times a day (BID) | ORAL | 0 refills | Status: AC
Start: 2023-01-12 — End: 2023-04-12

## 2023-01-12 NOTE — Telephone Encounter (Signed)
I spoke with pt to inform     Call to inform pt contract does not allow for other controlled substance scripts to be filled from other providers.  In the future needs to check with our office first to make sure use of both safe prior to filling.     Pt stated he is sooo sorry that he didn't know and that he got the medication from his foot doctor but pt stated that he is very sorry and that it will never happen again     11/24/2022 [name], MD  Visit date not found

## 2023-01-19 ENCOUNTER — Ambulatory Visit: Admit: 2023-01-19 | Payer: Medicare (Managed Care)

## 2023-01-19 DIAGNOSIS — R7303 Prediabetes: Secondary | ICD-10-CM

## 2023-01-19 NOTE — Patient Instructions (Signed)
Avocado oil and Olive Oil     Avocado oil spray     Pea protein isolate     Fairlife protein shake (whey)

## 2023-01-19 NOTE — Assessment & Plan Note (Signed)
Has increased exercise Cardio up to 6 miles on the bike, added Elliptical.      Has stopped sugar drinks as was having some smoothies and kool-aid prior to knowing.

## 2023-01-19 NOTE — Assessment & Plan Note (Signed)
Scheduled with GI and has appt to get screening given new anemia.

## 2023-01-19 NOTE — Progress Notes (Signed)
Pre-Visit Preventative Plannning  Lab Results   Component Value Date    HGBA1C 4.8 11/24/2022     Health Maintenance   Topic Date Due    Colorectal Cancer Screening (MyChart)  07/18/2022    Annual Medicare Wellness Visit  11/08/2022    Comprehensive Physical Exam  11/08/2022    Renal Function/GFR  12/27/2023    Depression Monitoring (PHQ-9)  01/19/2024    Diabetes Screening  11/24/2025    Immunization: DTaP/Tdap/Td (2 - Td or Tdap) 11/15/2027    Hepatitis C Screening (MyChart)  Completed    HIV Screening  Completed    Immunization: Influenza (MyChart)  Completed    Immunization: Zoster  Completed    Immunization: RSV (Adult)  Completed    Immunization: Pneumococcal  Aged [employer] History   Administered Date(s) Administered    COVID-19, mRNA, Pfizer 2023 monovalent tris-sucrose, age 52+ 08/04/2022    COVID-19, mRNA, Pfizer bivalent booster, tris-sucrose, age 52+ 07/17/2021    COVID-19, mRNA, Pfizer monovalent 12/25/2019, 01/22/2020, 07/14/2020, 01/14/2021    Influenza, quadrivalent, preservative-free 06/26/2018, 05/31/2019, 06/15/2020, 07/17/2021    Influenza, unspecified 06/28/2022    Pneumococcal polysaccharide, 23-valent 06/16/2020    RSV, recombinant (Arexvy) 06/28/2022    Zoster, recombinant 09/22/2020, 01/19/2021    tdap 11/14/2017     CC:   Chief Complaint   Patient presents with    Results     HPI: See A&P for details of HPI    ROS: Please see HPI    Home Meds:  Current Outpatient Medications   Medication Sig    atorvastatin Take 1 tablet (20 mg total) by mouth daily.    azelastine Use 1 spray into each nostril 2 times a day. Use in each nostril as directed    cholecalciferol (vitamin D3) Take by mouth.    docusate sodium Take 1 capsule (100 mg total) by mouth 2 times a day as needed for Constipation.    ferrous sulfate Take 1 tablet (325 mg total) by mouth daily with breakfast.    hydrocortisone Apply topically 2 times a day. Apply to anus    lidocaine Place 1 patch onto the skin daily. Apply  patch for 12 hours and then remove patch and leave off for 12 hours.    lisinopriL Take 1 tablet (5 mg total) by mouth daily. NEW LOWER DOSAGE    multivitamin Take 1 capsule by mouth daily.    polyethylene glycol To Prep for colonoscopy. Drink 8oz every 20-30 minutes until the entire bottle is consumed.    pregabalin Take 1 capsule (150 mg total) by mouth 2 times a day.    tadalafiL Take 1 tablet (5 mg total) by mouth daily. USE GOOD RX     No current facility-administered medications for this visit.     PE:  BP 138/85 (BP Location: Right upper arm, Patient Position: Sitting, BP Cuff Size: Large)   Pulse 72   Resp 16   Ht 5' 11 (1.803 m)   Wt 196 lb (88.9 kg)   SpO2 98%   BMI 27.34 kg/m   Wt Readings from Last 3 Encounters:   01/19/23 196 lb (88.9 kg)   12/27/22 201 lb (91.2 kg)   11/24/22 197 lb (89.4 kg)     Today's body mass index is 27.34 kg/m.     General: Well appearing, well developed, well noursed. No acute distress.    Skin: No rashes or suspicious lesions  Cardiac: Regular rate and rhythm. No  murmurs rubs or gallops.  Normal S1 and S2.    Respiratory: Clear to auscultation bilateral.  No respiratory distress.  No Tachypnea  Psych: Alert and oriented.  Normal mood and affect    A/P: Mitchell Evans is a 62 y.o. male here for review labs.  Reviewed BMP, CBC and A1c from 11/24/22 and 12/27/22    Mitchell Evans was seen today for results.    Diagnoses and all orders for this visit:    Prediabetes (Primary)  Assessment & Plan:  Has increased exercise Cardio up to 6 miles on the bike, added Elliptical.      Has stopped sugar drinks as was having some smoothies and kool-aid prior to knowing.        Iron deficiency anemia due to chronic blood loss  Assessment & Plan:  Scheduled with GI and has appt to get screening given new anemia.          No follow-ups on file.    Mitchell Juneau, MD  [facility]

## 2023-01-30 ENCOUNTER — Inpatient Hospital Stay
Admit: 2023-01-30 | Discharge: 2023-01-31 | Disposition: A | Discharge status: Home / Self Care | Payer: Medicare (Managed Care)

## 2023-01-30 DIAGNOSIS — M5412 Radiculopathy, cervical region: Secondary | ICD-10-CM

## 2023-01-30 MED ORDER — predniSONE (DELTASONE) 20 MG tablet
20 | ORAL_TABLET | Freq: Every day | ORAL | 0 refills | Status: AC
Start: 2023-01-30 — End: 2023-02-04

## 2023-01-30 MED ORDER — methocarbamoL (ROBAXIN-750) 750 MG tablet
750 | ORAL_TABLET | Freq: Three times a day (TID) | ORAL | 0 refills | Status: AC | PRN
Start: 2023-01-30 — End: 2023-04-11

## 2023-01-30 MED ORDER — methocarbamoL (ROBAXIN) tablet 500 mg
500 | Freq: Once | ORAL | Status: AC
Start: 2023-01-30 — End: 2023-01-30
  Administered 2023-01-31: 500 mg via ORAL

## 2023-01-30 MED ORDER — ketorolac (TORADOL) injection 15 mg
15 | Freq: Once | INTRAMUSCULAR | Status: AC
Start: 2023-01-30 — End: 2023-01-30
  Administered 2023-01-31: 15 mg via INTRAMUSCULAR

## 2023-01-30 MED ORDER — lidocaine (LIDODERM) 5 % 1 patch
5 | Freq: Once | TOPICAL | Status: AC
Start: 2023-01-30 — End: 2023-01-31
  Administered 2023-01-31: 1 via TRANSDERMAL

## 2023-01-30 MED ORDER — predniSONE (DELTASONE) tablet 40 mg
20 | Freq: Once | ORAL | Status: AC
Start: 2023-01-30 — End: 2023-01-30
  Administered 2023-01-31: 40 mg via ORAL

## 2023-01-30 MED ORDER — acetaminophen (TYLENOL) tablet 975 mg
325 | Freq: Once | ORAL | Status: AC
Start: 2023-01-30 — End: 2023-01-30
  Administered 2023-01-31: 975 mg via ORAL

## 2023-01-30 NOTE — Other (Incomplete)
[facility] Provider Note  MEDICAL SCREENING EXAM    Date of Service: 01/30/2023    Reason for Visit: Chest Pain (Pt. Aox4 male c/o left chest and left shoulder pain that started earlier today. Pt. Sts. He has had nausea as well.)      Patient History     HPI: Mitchell Evans is a 62 y.o. male presenting with ***.    Medical History:   Past Medical History:   Diagnosis Date    Essential hypertension     Hepatitis C 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 08/16/2021    Needs confirmed    Hyperlipidemia     Low serum HDL 08/16/2021    [name] (left ventricular hypertrophy) 11/28/2022    Nonrheumatic mitral valve regurgitation 11/28/2022    Normocytic anemia 11/09/2021    Prediabetes 08/16/2021    Spinal stenosis of cervical region      Surgical History:   Past Surgical History:   Procedure Laterality Date    CERVICAL FUSION      [place] SURGERY      SP[place] SURGERY  2003     Medications: No current facility-administered medications for this encounter.    Current Outpatient Medications:     atorvastatin (LIPITOR) 20 MG tablet, Take 1 tablet (20 mg total) by mouth daily., Disp: 90 tablet, Rfl: 1    azelastine (ASTELIN) 137 mcg (0.1 %) nasal spray, Use 1 spray into each nostril 2 times a day. Use in each nostril as directed, Disp: 30 mL, Rfl: 0    cholecalciferol, vitamin D3, 125 mcg (5,000 unit) Tab, Take by mouth., Disp: , Rfl:     docusate sodium (COLACE) 100 MG capsule, Take 1 capsule (100 mg total) by mouth 2 times a day as needed for Constipation., Disp: 60 capsule, Rfl: 0    ferrous sulfate 325 (65 FE) MG tablet, Take 1 tablet (325 mg total) by mouth daily with breakfast., Disp: 90 tablet, Rfl: 1    hydrocortisone (PREPARATION H HYDROCORTISONE) 1 % cream, Apply topically 2 times a day. Apply to anus, Disp: 28 g, Rfl: 0    lidocaine (LIDODERM) 5 %, Place 1 patch onto the skin daily. Apply patch for 12 hours and then remove patch and leave off for 12 hours., Disp: 30 patch, Rfl: 0    lisinopriL (PRINIVIL) 5 MG tablet,  Take 1 tablet (5 mg total) by mouth daily. NEW LOWER DOSAGE, Disp: 90 tablet, Rfl: 1    multivitamin capsule, Take 1 capsule by mouth daily., Disp: , Rfl:     polyethylene glycol (GOLYTELY) 236-22.74-6.74 -5.86 gram solution, To Prep for colonoscopy. Drink 8oz every 20-30 minutes until the entire bottle is consumed., Disp: 4000 mL, Rfl: 0    pregabalin (LYRICA) 150 MG capsule, Take 1 capsule (150 mg total) by mouth 2 times a day., Disp: 180 capsule, Rfl: 0    tadalafiL (CIALIS) 5 MG tablet, Take 1 tablet (5 mg total) by mouth daily. USE GOOD RX, Disp: 90 tablet, Rfl: 3  Social History: Jacque Garrels  reports that he has never smoked. He has never used smokeless tobacco. He reports that he does not currently use alcohol. He reports that he does not currently use drugs after having used the following drugs: Marijuana and Medical Marijuana.  Allergies:   Allergies as of 01/30/2023    (No Known Allergies)        Brief ROS     Pertinent positive and negative ROS are documented above in the HPI.  Physical Exam     ED Triage Vitals [01/30/23 1902]   Enc Vitals Group      BP 138/82      Heart Rate 92      Resp 20      Temp 98.4 F (36.9 C)      Temp Source Temporal      SpO2 100 %      Weight 203 lb (92.1 kg)      Height 5' 11 (1.803 m)      Head Circumference       Peak Flow       Pain Score Three      Pain Loc       Pain Edu?       Excl. in GC?        Physical Exam  Vitals:    01/30/23 1902   BP: 138/82   BP Location: Right upper arm   Patient Position: Sitting   BP Cuff Size: Regular   Pulse: 92   Resp: 20   Temp: 98.4 F (36.9 C)   TempSrc: Temporal   SpO2: 100%   Weight: 203 lb (92.1 kg)   Height: 5' 11 (1.803 m)     General:  Well appearing. No acute distress***  Head: Atraumatic ***  Eyes:  Pupils reactive. No discharge from eyes. No conjuntival redness or scleral icterus. ***  ENT:  MMM. No discharge from nose. OP clear without exudate. ***  Pulmonary:   Non-labored breathing. Breath sounds clear bilaterally  without rales, wheezes, or ronchi.***  Cardiac:  Regular rate and rhythm. No murmurs, rubs, or gallops. ***   Abdomen:  Soft. NBS. Non-distended, non-tender. No HSM. ***  Genitourinary: ***  Musculoskeletal:  No long bone deformity. ***  Vascular:  Extremities warm and perfused. ***  Skin:  Dry, no rashes, no jaundice. No spider angiomata/palmar erythema. ***  Extremities:  No peripheral edema***  ***Neuro:  Alert. Moves all four extremities to command. No focal deficit  Psych: Appropriate mood and affect, answering questions appropriately ***    MSE Plan     Patient evaluated from the lobby for a medical screening exam.  In short, this is a patient presenting with ***.     To further evaluate their complaints, the following orders have been placed:    Labs Reviewed - No data to display  No orders to display     EKG: {ekg [id number]:315101}.    See primary provider's note for full details and final disposition.     MSE Dispo     ***Stable for lobby while awaiting ED bed  ***Based on the patient's presentation, they have been identified as needing a bed urgently. Triage, the charge RN, and ED attending Dr. Marland Kitchen have been notified.     Derrek Gu, MD  PGY-4 Resident Physician   Emergency Medicine

## 2023-01-30 NOTE — ED Triage Notes (Signed)
Pt. Aox4 male c/o left chest and left shoulder pain that started earlier today. Pt. Sts. He has had nausea as well.

## 2023-01-30 NOTE — ED Provider Notes (Signed)
[facility] ED Note    Date of Service: (Not on file)      Reason for Visit: Chest Pain (Pt. Aox4 male c/o left chest and left shoulder pain that started earlier today. Pt. Sts. He has had nausea as well.)      Patient History     HPI:  Mitchell Evans is a 62 y.o. male presenting with  Chief Complaint   Patient presents with    Chest Pain     Pt. Aox4 male c/o left chest and left shoulder pain that started earlier today. Pt. Sts. He has had nausea as well.       Mitchell Evans is a 62 y.o. male presenting with hx of [name] who is presenting with concern for severe left arm pain. The patient states that he has stenosis of the spine and he has had previous cervical fusion. He is now complaining of significant L arm pain. He feels that it is very difficult to even lift his arm. He has tried oxy as well as gabapentin without alleviation of his symptoms. He has no recent trauma to the area. He denies any f/a/c. He does have tingling in the L arm which he has had off and on. He has no loss of bowel or bladder function. He has no loss of sensation in the saddle region as well as legs. He did have a small amount of chest pain but believes that it is likely related to his L arm pain. That has since resolved. This was non exertional. Never had MI. He works out extensively.     With the exception of above, the patient denies any aggravating or alleviating factors as well as any other associated signs or symptoms.    Past Medical History:   Diagnosis Date    Essential hypertension     Hepatitis C 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 08/16/2021    Needs confirmed    Hyperlipidemia     Low serum HDL 08/16/2021    [name] (left ventricular hypertrophy) 11/28/2022    Nonrheumatic mitral valve regurgitation 11/28/2022    Normocytic anemia 11/09/2021    Prediabetes 08/16/2021    Spinal stenosis of cervical region        Past Surgical History:   Procedure Laterality Date    CERVICAL FUSION      KNEE SURGERY      SPINE SURGERY  2003       Mitchell Evans   reports that he has never smoked. He has never used smokeless tobacco. He reports that he does not currently use alcohol. He reports that he does not currently use drugs after having used the following drugs: Marijuana and Medical Marijuana.    Previous Medications    ATORVASTATIN (LIPITOR) 20 MG TABLET    Take 1 tablet (20 mg total) by mouth daily.    AZELASTINE (ASTELIN) 137 MCG (0.1 %) NASAL SPRAY    Use 1 spray into each nostril 2 times a day. Use in each nostril as directed    CHOLECALCIFEROL, VITAMIN D3, 125 MCG (5,000 UNIT) TAB    Take by mouth.    DOCUSATE SODIUM (COLACE) 100 MG CAPSULE    Take 1 capsule (100 mg total) by mouth 2 times a day as needed for Constipation.    FERROUS SULFATE 325 (65 FE) MG TABLET    Take 1 tablet (325 mg total) by mouth daily with breakfast.    HYDROCORTISONE (PREPARATION H HYDROCORTISONE) 1 % CREAM    Apply topically 2  times a day. Apply to anus    LIDOCAINE (LIDODERM) 5 %    Place 1 patch onto the skin daily. Apply patch for 12 hours and then remove patch and leave off for 12 hours.    LISINOPRIL (PRINIVIL) 5 MG TABLET    Take 1 tablet (5 mg total) by mouth daily. NEW LOWER DOSAGE    MULTIVITAMIN CAPSULE    Take 1 capsule by mouth daily.    POLYETHYLENE GLYCOL (GOLYTELY) 236-22.74-6.74 -5.86 GRAM SOLUTION    To Prep for colonoscopy. Drink 8oz every 20-30 minutes until the entire bottle is consumed.    PREGABALIN (LYRICA) 150 MG CAPSULE    Take 1 capsule (150 mg total) by mouth 2 times a day.    TADALAFIL (CIALIS) 5 MG TABLET    Take 1 tablet (5 mg total) by mouth daily. USE GOOD RX       Allergies:   Allergies as of 01/30/2023    (No Known Allergies)       Nursing notes reviewed.    Review of Systems     ROS  Positive for: L arm pain   Negative for: Chest pain, chest tightness, shortness of breath, PND, orthopnea, abdominal pain, nausea, vomiting, diarrhea, headache, vision changes, numbness, tingling, weakness, bleeding, fevers, aches, or chills   A comprehensive ROS was  performed and is otherwise negative.     Physical Exam     ED Triage Vitals [01/30/23 1902]   Vital Signs Group      Temp 98.4 F (36.9 C)      Temp Source Temporal      Heart Rate 92      Heart Rate Source Monitor      Resp 20      SpO2 100 %      BP 138/82      MAP (mmHg) 109      BP Method Automatic      BP Location Right upper arm      BP Cuff Size Regular      Patient Position Sitting   SpO2 100 %   O2 Device        General:  62 y.o. male well-appearing    HENT:  Normocephalic, atraumatic. No nasal discharge. Moist mucus membranes. Oropharynx without bleeding, purulence, erythema and edema.    Eyes:  PERRL. Conjunctivae pink, moist. No scleral icterus    Neck:  Supple. Trachea midline.     Pulmonary:   Normal effort. CTAB    Cardiac:  RRR no m/r/g. Radial pulses 2+ bilaterally.    Abdomen:  Soft, not tender, not distended.     Musculoskeletal:  Normal bulk and tone for age.On physical examination patient has significant tenderness palpation specifically in the bicep tendon groove as well as over the left shoulder.  He has no tenderness palpation in the C, T, or L-spine.  He does have pain with external rotation of the left shoulder and he is able to elevate his left shoulder to about 90 degrees.  He has 5 out of 5 strength flexion and extension at the elbow.  He has 3 out of 5 strength to the left upper extremity for grip, 5 out of 5 on the right.  AIN/PIN/IO intact.  Normal sensation radial, median, and ulnar nerve distributions.    Skin:  Warm, dry. No rashes, ecchymosis or cyanosis.    Neuro: Awake, alert, interactive. Moving all extremities spontaneously. Gait normal. Speech fluent without dysarthria.    Extremities:  No peripheral edema.    Psych: Appropriate mood and affect      Diagnostic Studies     Labs:  Please see electronic medical record for any tests performed in the ED     Radiology:  No orders to display       I reviewed and agree with the radiology interpretation of the images  obtained.    EKG:  See below     Emergency Department Procedures     None    ED Course and MDM   Mitchell Evans is a 62 y.o. male who presented to the emergency department with Chest Pain (Pt. Aox4 male c/o left chest and left shoulder pain that started earlier today. Pt. Sts. He has had nausea as well.)   with a history and presentation as described above in HPI. The patient was evaluated by myself and the ED Attending Physician. All management and disposition plans were discussed and agreed upon.    Given no history of trauma, patient's age, risk factors, current symptoms and duration of symptoms imaging evaluation is not necessary at this point. No history of cancer, IV drug use, immunocompromise, recent bacterial infection, and no fever, makes spinal metastases, epidural access, and osteomyelitis lower on the differential. No significant weakness, saddle anesthesia, or bowel/bladder dysfunction makes cauda equina syndrome and spinal cord compression unlikely. Given patient age and lack of risk factors vertebral compression factor is an unlikely cause of the patients back pain. Patients back pain is most likely musculoskeletal in origin. Consider radiculopathy from known cervical spine dz vs brachial tendon injury in the setting of strenuous exercise. Rec follow up with NSGY and sports med. Will dc with symptomatic treatment including tylenol, ibuprofen as well as robaxin and prednisone.  Patient with improved ROM and symptoms following treatment as below.     As for the patient's complaint of chest pain, and discussion, the patient states that it is actually all primarily his left arm and shoulder.  However, he has had some radiation briefly into the chest but this is completely resolved.  He denies any exertional component to his chest pain and denies any shortness of breath.  No history of myocardial infarction.  EKG here reveals no evidence of STEMI or significant EKG abnormalities.  History and presentation  very atypical for ACS and is more likely musculoskeletal as noted above.  As result, do not believe patient requires further workup for this.  However, I did tell the patient that, should he experience any worsening or change in his symptoms, he should return immediately for repeat evaluation.  Patient discharged with robust return precautions.      The patient was given symptomatic treatment as detailed below, instructions for home care including back-stretching exercises, and follow-up instructions.      ED Course as of 01/30/23 2046   Mon Jan 30, 2023   2003 ED ECG 12-Lead (MUSE)  From and reveals a normal sinus rhythm with a ventricular rate of 86 bpm.  PR interval is within normal limits.  QRS is narrow.  There is no QT or QTc prolongation.  There is a normal axis.  Patient appears to have [name].  There is minor early repolarization changes noted in the precordial leads.  Patient is noted to have T wave inversion in lead III.        Medical Decision Making  Problems Addressed:  Cervical radiculopathy: complicated acute illness or injury    Amount and/or Complexity of Data Reviewed  ECG/medicine  tests:  Decision-making details documented in ED Course.    Risk  OTC drugs.  Prescription drug management.          Medications received during this ED visit:  Medications   lidocaine (LIDODERM) 5 % 1 patch (1 patch Transdermal Patch Applied 01/30/23 2018)   methocarbamoL (ROBAXIN) tablet 500 mg (500 mg Oral Given 01/30/23 2018)   ketorolac (TORADOL) injection 15 mg (15 mg Intramuscular Given 01/30/23 2019)   acetaminophen (TYLENOL) tablet 975 mg (975 mg Oral Given 01/30/23 2018)   predniSONE (DELTASONE) tablet 40 mg (40 mg Oral Given 01/30/23 2018)         Impression     1. Cervical radiculopathy           Plan   I do not believe that this patient requires any further work-up or inpatient management for their complaints, and are appropriate for outpatient follow-up.  I discussed the findings of my physical exam and work-up  with the patient, and they were given the opportunity to ask questions.  The patient is agreeable with the plan and is ready to be discharged.    The patient was discharged  with prescriptions for :  New Prescriptions    METHOCARBAMOL (ROBAXIN-750) 750 MG TABLET    Take 1 tablet (750 mg total) by mouth every 8 hours as needed for up to 5 doses.    PREDNISONE (DELTASONE) 20 MG TABLET    Take 2 tablets (40 mg total) by mouth daily for 5 days.   .   Patient instructed to follow-up with PCP , sports medicine as well as  , and given strict return precautions. All of patient's questions were answered satisfactorily, ambulating without assistance with stable vital signs and he was subsequently sent home in stable condition.        Derrek Gu, MD  PGY4, [facility]         Derrek Gu, MD  Resident  01/30/23 6045       Derrek Gu, MD  Resident  01/30/23 [phone]

## 2023-01-30 NOTE — ED Provider Notes (Signed)
ED Attending Attestation Note    Date of service:  (Not on file)    This patient was seen by the resident physician.  I have seen and examined the patient, agree with the workup, evaluation, management and diagnosis. The care plan has been discussed and I concur.  I have reviewed the ECG and concur with the resident's interpretation.    My assessment reveals a 62 y.o. male with very reproducible left shoulder pain.  Patient is tender over the bicipital tendon groove, and is neurovascularly intact.Mitchell Evans

## 2023-01-30 NOTE — Discharge Instructions (Addendum)
You were seen in the emergency department for   Chief Complaint   Patient presents with    Chest Pain     Pt. Aox4 male c/o left chest and left shoulder pain that started earlier today. Pt. Sts. He has had nausea as well.   .      I recommend that you follow up with your primary care physician in the next week as well as Dr. Richardson Dopp with NSGY for further evaluation and management.    I have prescribed you the following:  New Prescriptions    No medications on file     Please take these medications as prescribed.    Please return to the emergency department if you develop a fever, have new or worsening symptoms including headache, abdominal pain, nausea, vomiting, diarrhea, chest pain or chest tightness, feel as if you  are going to pass out / pass out, or have any other new or concerning symptoms.     Future Appointments   Date Time Provider Department Center   02/02/2023  1:15 PM Oletta Darter Kirke Shaggy, MD [facility]

## 2023-01-31 MED FILL — TYLENOL 325 MG TABLET: 325 325 mg | ORAL | Qty: 3

## 2023-01-31 MED FILL — PREDNISONE 20 MG TABLET: 20 20 MG | ORAL | Qty: 2

## 2023-01-31 MED FILL — METHOCARBAMOL 500 MG TABLET: 500 500 MG | ORAL | Qty: 1

## 2023-01-31 MED FILL — LIDODERM 5 % TOPICAL PATCH: 5 5 % | TOPICAL | Qty: 1

## 2023-01-31 MED FILL — KETOROLAC 15 MG/ML INJECTION SOLUTION: 15 15 mg/mL | INTRAMUSCULAR | Qty: 1

## 2023-02-02 ENCOUNTER — Ambulatory Visit: Payer: Medicare (Managed Care) | Attending: Gastroenterology

## 2023-02-06 MED ORDER — azelastine (ASTELIN) 137 mcg (0.1 %) nasal spray
137 | Freq: Two times a day (BID) | NASAL | 0 refills | Status: AC
Start: 2023-02-06 — End: ?

## 2023-02-06 NOTE — Telephone Encounter (Signed)
01/19/2023 Mitchell Juneau, MD  Visit date not found

## 2023-02-13 MED ORDER — atorvastatin (LIPITOR) 20 MG tablet
20 | ORAL_TABLET | Freq: Every day | ORAL | 1 refills | Status: AC
Start: 2023-02-13 — End: ?

## 2023-02-13 NOTE — Telephone Encounter (Signed)
01/19/2023 [name], MD     03/01/2023 [name], MD

## 2023-02-20 NOTE — Telephone Encounter (Signed)
LMTCB. Pt is on wait list for earlier appt with Dr Devin Going. Called to offer 5/13 3pm slot is he is still interested

## 2023-02-22 NOTE — Telephone Encounter (Signed)
Pt returned call.  Too late to offer appt

## 2023-02-23 ENCOUNTER — Ambulatory Visit: Payer: Medicare (Managed Care)

## 2023-03-01 ENCOUNTER — Ambulatory Visit: Payer: Medicare (Managed Care)

## 2023-03-02 ENCOUNTER — Ambulatory Visit: Payer: Medicare (Managed Care)

## 2023-03-02 DIAGNOSIS — M542 Cervicalgia: Secondary | ICD-10-CM

## 2023-03-10 ENCOUNTER — Ambulatory Visit: Payer: Medicare (Managed Care)

## 2023-03-17 MED ORDER — tadalafiL (CIALIS) 5 MG tablet
5 | ORAL_TABLET | Freq: Every day | ORAL | 0 refills
Start: 2023-03-17 — End: 2023-04-29

## 2023-03-17 NOTE — Telephone Encounter (Signed)
01/19/2023 Erin Moushey, MD  Visit date not found

## 2023-04-03 MED ORDER — lisinopriL (PRINIVIL) 5 MG tablet
5 | ORAL_TABLET | ORAL | 1 refills | Status: AC
Start: 2023-04-03 — End: ?

## 2023-04-03 NOTE — Telephone Encounter (Signed)
01/19/2023 Erin Moushey, MD  Visit date not found

## 2023-04-03 NOTE — Telephone Encounter (Addendum)
Spoke with pt regarding his recent no show/late cancellation.  Pt was reminded of the policy and advised the doctor would like to continue his care, however, this will require no additional no shows or late cancellations.  Pt was very apologetic and stated he really wants to keep Dr Devin Going as his PCP so he will make sure he avoid any additional no shows or late cancellations.

## 2023-04-05 ENCOUNTER — Ambulatory Visit: Admit: 2023-04-05 | Discharge: 2023-04-12 | Payer: Medicare (Managed Care)

## 2023-04-05 DIAGNOSIS — M25512 Pain in left shoulder: Secondary | ICD-10-CM

## 2023-04-05 NOTE — Patient Instructions (Signed)
Borrowed from Essentials of Musculoskeletal Care, 4th Edition  [employer], 2010

## 2023-04-05 NOTE — Progress Notes (Signed)
Pre-Visit Preventative Plannning  Lab Results   Component Value Date    HGBA1C 4.8 11/24/2022     Health Maintenance   Topic Date Due    Colorectal Cancer Screening (MyChart)  07/18/2022    Annual Medicare Wellness Visit  11/08/2022    Comprehensive Physical Exam  11/08/2022    Renal Function/GFR  12/27/2023    Depression Monitoring (PHQ-9)  01/19/2024    Diabetes Screening  11/24/2025    Immunization: DTaP/Tdap/Td (2 - Td or Tdap) 11/15/2027    Hepatitis C Screening (MyChart)  Completed    HIV Screening  Completed    Immunization: Influenza (MyChart)  Completed    Immunization: Zoster  Completed    Immunization: RSV (Adult)  Completed    Immunization: Pneumococcal  Aged [employer] History   Administered Date(s) Administered    COVID-19, mRNA, Pfizer 2023 monovalent tris-sucrose, age 58+ 08/04/2022    COVID-19, mRNA, Pfizer bivalent booster, tris-sucrose, age 58+ 07/17/2021    COVID-19, mRNA, Pfizer monovalent 12/25/2019, 01/22/2020, 07/14/2020, 01/14/2021    Influenza, quadrivalent, preservative-free 06/26/2018, 05/31/2019, 06/15/2020, 07/17/2021    Influenza, unspecified 06/28/2022    Pneumococcal polysaccharide, 23-valent 06/16/2020    RSV, recombinant (Arexvy) 06/28/2022    Zoster, recombinant 09/22/2020, 01/19/2021    tdap 11/14/2017     CC:   Chief Complaint   Patient presents with    Discharge Follow-up     HPI: See A&P for details of HPI    Seen in ER 4/15 for left chest pain and feels like from left shoulder.  Has left shoulder pain.  Feels like from left shoulder pain.  Wearing compression support.      ROS: Please see HPI    Home Meds:  Current Outpatient Medications   Medication Sig    atorvastatin Take 1 tablet (20 mg total) by mouth daily.    azelastine Use 1 spray into each nostril 2 times a day. Use in each nostril as directed    cholecalciferol (vitamin D3) Take by mouth.    docusate sodium Take 1 capsule (100 mg total) by mouth 2 times a day as needed for Constipation.    ferrous sulfate  Take 1 tablet (325 mg total) by mouth daily with breakfast.    hydrocortisone Apply topically 2 times a day. Apply to anus    lidocaine Place 1 patch onto the skin daily. Apply patch for 12 hours and then remove patch and leave off for 12 hours.    lisinopriL TAKE ONE TABLET BY MOUTH EVERY DAY NEW LOWER DOSE    methocarbamoL Take 1 tablet (750 mg total) by mouth every 8 hours as needed for up to 5 doses.    multivitamin Take 1 capsule by mouth daily.    polyethylene glycol To Prep for colonoscopy. Drink 8oz every 20-30 minutes until the entire bottle is consumed.    pregabalin Take 1 capsule (150 mg total) by mouth 2 times a day.    tadalafiL Take 1 tablet (5 mg total) by mouth daily. USE GOOD RX     No current facility-administered medications for this visit.     PE:  BP 143/69 (BP Location: Right upper arm, Patient Position: Sitting, BP Cuff Size: Regular)   Pulse 83   Resp 16   Ht 5' 11 (1.803 m)   Wt 202 lb (91.6 kg)   SpO2 97%   BMI 28.17 kg/m   Wt Readings from Last 3 Encounters:   04/05/23 202 lb (91.6  kg)   01/30/23 203 lb (92.1 kg)   01/19/23 196 lb (88.9 kg)     Today's body mass index is 28.17 kg/m.     General: Well appearing, well developed, well noursed. No acute distress.    Skin: No rashes or suspicious lesions  Cardiac: Regular rate and rhythm. No murmurs rubs or gallops.  Normal S1 and S2.    Respiratory: Clear to auscultation bilateral.  No respiratory distress.  No Tachypnea  Vascular: No edema  Psych: Alert and oriented.  Normal mood and affect    A/P: Mitchell Evans is a 62 y.o. male here for acute left shoulder     Mitchell Evans was seen today for discharge follow-up.    Diagnoses and all orders for this visit:    Acute pain of left shoulder (Primary)  -     Orthopedics  -     X-ray Shoulder Left min 2-views; Future      [name], MD  [facility]

## 2023-04-19 ENCOUNTER — Ambulatory Visit: Admit: 2023-04-19 | Discharge: 2023-04-19 | Payer: Medicare (Managed Care) | Attending: Sports Medicine

## 2023-04-19 ENCOUNTER — Inpatient Hospital Stay: Admit: 2023-04-19 | Payer: Medicare (Managed Care)

## 2023-04-19 DIAGNOSIS — M19012 Primary osteoarthritis, left shoulder: Secondary | ICD-10-CM

## 2023-04-19 DIAGNOSIS — M25512 Pain in left shoulder: Secondary | ICD-10-CM

## 2023-04-19 MED ORDER — BUPivacaine HCl (MARCAINE) 0.5 % (5 mg/mL) injection 10 mg
0.5 | Freq: Once | INTRAMUSCULAR | Status: AC
Start: 2023-04-19 — End: 2023-04-19
  Administered 2023-04-19: 18:00:00 10 mL via SUBCUTANEOUS

## 2023-04-19 MED ORDER — lidocaine 10 mg/mL (1 %) injection 2 mL
10 | Freq: Once | INTRAMUSCULAR | Status: AC
Start: 2023-04-19 — End: 2023-04-19
  Administered 2023-04-19: 18:00:00 2 mL via SUBCUTANEOUS

## 2023-04-19 MED ORDER — triamcinolone acetonide (KENALOG-40) injection 40 mg
40 | Freq: Once | INTRAMUSCULAR | Status: AC
Start: 2023-04-19 — End: 2023-04-19
  Administered 2023-04-19: 18:00:00 40 mg

## 2023-04-19 NOTE — Progress Notes (Signed)
This injection is documented for Mitchell Laurence, MD.    Per provider's instructions, the patient was prepped for a left shoulder injection by Mitchell Laurence, MD  - as well as drug and  allergies were reviewed.   The appropriate medication was drawn, and documented.  A medical assistant was also present to assist with the injection. The injection was administered by Mitchell Laurence, MD. The patient was then educated on what to expect following the injection.   All questions were answered.    A verbal consent was obtained and a verbal timeout was performed on 04/19/2023 at 10:15 AM.     Leodis Binet, MA

## 2023-04-19 NOTE — Progress Notes (Signed)
[facility]    PATIENT NAME:      Mitchell Evans, Mitchell Evans                MRN:                 [id number]  DATE OF BIRTH:     [birth year]                    CSN:                 [id number]  PROVIDER:          Andree Elk, MD               VISIT DATE:          04/19/2023                                   OFFICE NOTE      HISTORY OF PRESENT ILLNESS:  He is here for his left shoulder.  He has been having aching pain for the last couple of months.  No specific injury.  He has also had a previous history of a cervical spine surgery.  Overall, his motion is very reticent and   he reports significant nocturnal symptoms and problems playing golf.    PAST MEDICAL HISTORY:  Per the medical template, reviewed, and entered in Epic.    MEDICATIONS:  Per the medical template, reviewed, and entered in Epic.    ALLERGIES:  Per the medical template, reviewed, and entered in Epic.    REVIEW OF SYSTEMS:  Per the medical template, reviewed, and entered in Epic.    PHYSICAL EXAMINATION:  Demonstrates 110 degrees of forward flexion, 60 degrees of external rotation with the arm at his side, ability to resist these planes of motion as well pretty nicely, however, it is significantly pain limited.  He has a lot of tenderness at the [facility] joint   and coracoid and pain with passive external rotation with the arm at the side.    IMAGING:  X-rays show degenerative changes of the glenohumeral space, none at the subacromial space, and a moderate amount at the acromioclavicular space.    Today, I had a long discussion with him regarding the disease processes going on within his shoulder, I think his rotator cuff is inflamed.  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  He settled on getting rid of   some of that rotator cuff inflammation result first, so we injected his subacromial space with Marcaine, Kenalog, and lidocaine.  We got him into  physical therapy.  I will see him back in 4-8 weeks.        Andree Elk, MD      BG/AQ  DD: 04/19/2023 10:31:15  DT: 04/19/2023 10:43:53    JOB#:  [id number]

## 2023-04-24 MED ORDER — pregabalin (LYRICA) 150 MG capsule
150 | ORAL_CAPSULE | Freq: Two times a day (BID) | ORAL | 0 refills | Status: AC
Start: 2023-04-24 — End: 2023-07-23

## 2023-04-24 NOTE — Telephone Encounter (Signed)
[name] was pulled, last filled 01/13/23 90 day supply.  04/05/2023 [name], MD  Visit date not found

## 2023-05-01 MED ORDER — tadalafiL (CIALIS) 5 MG tablet
5 | ORAL_TABLET | Freq: Every day | ORAL | 0 refills
Start: 2023-05-01 — End: 2023-07-18

## 2023-05-01 NOTE — Telephone Encounter (Signed)
04/05/2023 Mitchell Moushey, MD  Visit date not found

## 2023-06-07 NOTE — Telephone Encounter (Signed)
Pt was calling to rescheduled a colo with Dr. Verl Bangs that was cancelled back in April. The call dropped but I am sure he will try to call back to schedule. Pt can be reached at [phone]. Please advise. Thank you.

## 2023-06-08 NOTE — Telephone Encounter (Signed)
Called patient to schedule office visit. No answer. LVM

## 2023-06-14 ENCOUNTER — Ambulatory Visit: Admit: 2023-06-14 | Payer: Medicare (Managed Care)

## 2023-06-14 DIAGNOSIS — K649 Unspecified hemorrhoids: Secondary | ICD-10-CM

## 2023-06-14 NOTE — Progress Notes (Signed)
Pre-Visit Preventative Plannning  Lab Results   Component Value Date    HGBA1C 4.8 11/24/2022     Health Maintenance   Topic Date Due    Colorectal Cancer Screening (MyChart)  07/18/2022    Annual Medicare Wellness Visit  11/08/2022    Comprehensive Physical Exam  11/08/2022    Immunization: Influenza (MyChart) (1) 06/18/2023    Renal Function/GFR  12/27/2023    Depression Monitoring (PHQ-9)  01/19/2024    Diabetes Screening  11/24/2025    Immunization: DTaP/Tdap/Td (2 - Td or Tdap) 11/15/2027    Hepatitis C Screening (MyChart)  Completed    HIV Screening  Completed    Immunization: Zoster  Completed    Immunization: RSV (Adult)  Completed    Immunization: Pneumococcal  Aged [employer] History   Administered Date(s) Administered    COVID-19, mRNA, Pfizer 2023 monovalent tris-sucrose, age 61+ 08/04/2022    COVID-19, mRNA, Pfizer bivalent booster, tris-sucrose, age 61+ 07/17/2021    COVID-19, mRNA, Pfizer monovalent 12/25/2019, 01/22/2020, 07/14/2020, 01/14/2021    Influenza, quadrivalent, preservative-free 06/26/2018, 05/31/2019, 06/15/2020, 07/17/2021    Influenza, unspecified 06/28/2022    Pneumococcal polysaccharide, 23-valent 06/16/2020    RSV, recombinant (Arexvy) 06/28/2022    Zoster, recombinant 09/22/2020, 01/19/2021    tdap 11/14/2017     CC:   Chief Complaint   Patient presents with    Mass     Has a knot on the right shoulder. Tight feeling.      HPI: See A&P for details of HPI    Noticed a knot on right shoulder and feels like tight with certain movements.      ROS: Please see HPI    Home Meds:  Current Outpatient Medications   Medication Sig    atorvastatin Take 1 tablet (20 mg total) by mouth daily.    azelastine Use 1 spray into each nostril 2 times a day. Use in each nostril as directed    cholecalciferol (vitamin D3) Take by mouth.    docusate sodium Take 1 capsule (100 mg total) by mouth 2 times a day as needed for Constipation.    ferrous sulfate Take 1 tablet (325 mg total) by mouth  daily with breakfast.    lidocaine Place 1 patch onto the skin daily. Apply patch for 12 hours and then remove patch and leave off for 12 hours.    lisinopriL TAKE ONE TABLET BY MOUTH EVERY DAY NEW LOWER DOSE    multivitamin Take 1 capsule by mouth daily.    polyethylene glycol To Prep for colonoscopy. Drink 8oz every 20-30 minutes until the entire bottle is consumed.    pregabalin Take 1 capsule (150 mg total) by mouth 2 times a day.    tadalafiL Take 1 tablet (5 mg total) by mouth daily. USE GOOD RX     No current facility-administered medications for this visit.     PE:  BP 135/71 (BP Location: Left upper arm, Patient Position: Sitting, BP Cuff Size: Large)   Pulse 72   Resp 16   Ht 5' 11 (1.803 m)   Wt 202 lb (91.6 kg)   SpO2 94%   BMI 28.17 kg/m   Wt Readings from Last 3 Encounters:   06/14/23 202 lb (91.6 kg)   04/19/23 202 lb (91.6 kg)   04/05/23 202 lb (91.6 kg)     Today's body mass index is 28.17 kg/m.     Gen: No acute distress, well developed, well nourished  HEENT: Normocephalic, atraumatic  Right shoulder with 1 cm mobile subcutaneous cyst over AC joint   CV: Hemodynamically stable  Resp: No tachypnea or increased WOB  Neuro: A&O x 3, answering questions appropriately  Psych: Normal mood and affect, behavior appropriate    A/P: Mitchell Evans is a 62 y.o. male here for shoulder pain likely from arthritis with suspected incidental cyst over Mitchell Evans joint - discussed options - will watch cyst for growth or signs of infection and see Ortho about steroid shot for joint - use Tylenol PRN    Mitchell Evans was seen today for mass.    Diagnoses and all orders for this visit:    Hemorrhoids, unspecified hemorrhoid type (Primary)  -     Colorectal Surgery    Chronic right shoulder pain  -     Orthopedics  -     X-ray Shoulder Right min 2-views; Future      Return in about 3 months (around 09/14/2023) for In Office Visit - annual wellness.    Mitchell Juneau, MD  [facility]

## 2023-06-20 ENCOUNTER — Inpatient Hospital Stay: Admit: 2023-06-20 | Discharge: 2023-06-24 | Payer: Medicare (Managed Care)

## 2023-06-20 DIAGNOSIS — M25511 Pain in right shoulder: Secondary | ICD-10-CM

## 2023-06-21 ENCOUNTER — Ambulatory Visit: Payer: Medicare (Managed Care) | Attending: Sports Medicine

## 2023-06-21 DIAGNOSIS — M25511 Pain in right shoulder: Secondary | ICD-10-CM

## 2023-06-22 ENCOUNTER — Ambulatory Visit: Payer: Medicare (Managed Care)

## 2023-06-23 ENCOUNTER — Ambulatory Visit: Admit: 2023-06-23 | Discharge: 2023-06-27 | Payer: Medicare (Managed Care)

## 2023-06-23 DIAGNOSIS — K648 Other hemorrhoids: Secondary | ICD-10-CM

## 2023-06-23 MED ORDER — ferrous sulfate 325 (65 FE) MG tablet
325 | ORAL_TABLET | Freq: Every day | ORAL | 1 refills | Status: AC
Start: 2023-06-23 — End: 2023-08-03

## 2023-06-23 NOTE — Telephone Encounter (Signed)
 06/14/2023 [name], MD  Visit date not found

## 2023-06-23 NOTE — Progress Notes (Signed)
Chief Complaint   Patient presents with    New Patient Visit/ Consultation    Hemorrhoids        History of Present Illness  Referred by Dr. Devin Going for hemorrhoid symptoms.  He has had years of protruding tissue, discomfort, and bleeding.  Attributes this to long history of cervical spine pain for which he had to take narcotics and became constipated.  Admits that he has strained with bowel movements for years.  Constipation has improved as he has switched to medical marijuana from narcotics.  Taking Colace.  Had tried a fiber supplement in the past but caused too much bloating.  Very conscious about fiber intake.    I reviewed outside medical records of a colonoscopy performed in October 2018 in [place], [place].  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  There were internal hemorrhoids on retroflexion.  5-year follow-up was recommended.  Has had to reschedule his follow-up colonoscopy here and is seeing GI in late 09-12-2023.  Mother died of colon cancer.    No prior anorectal surgery.       Review of Systems   Constitutional:  Negative for chills, fatigue and fever.   HENT:  Negative for drooling, facial swelling and nosebleeds.    Eyes:  Negative for discharge, redness and visual disturbance.   Respiratory:  Negative for chest tightness, shortness of breath and wheezing.    Cardiovascular:  Negative for chest pain, palpitations and leg swelling.   Gastrointestinal:  Positive for anal bleeding, constipation and rectal pain. Negative for abdominal distention, abdominal pain, bloating, blood in stool, diarrhea, heartburn, nausea and vomiting.   Genitourinary:  Negative for flank pain, hematuria and urgency.   Musculoskeletal:  Negative for arthralgias, gait problem and myalgias.   Skin:  Negative for pallor, rash and wound.   Neurological:  Negative for dizziness, weakness and light-headedness.    Hematological:  Negative for adenopathy. Does not bruise/bleed easily.   Psychiatric/Behavioral:  Negative for behavioral problems and depression. The patient is not nervous/anxious.        Allergies  Patient has no known drug allergies or adverse reactions.    Medications  Outpatient Encounter Medications as of 06/23/2023   Medication Sig Dispense Refill    atorvastatin (LIPITOR) 20 MG tablet Take 1 tablet (20 mg total) by mouth daily. 90 tablet 1    azelastine (ASTELIN) 137 mcg (0.1 %) nasal spray Use 1 spray into each nostril 2 times a day. Use in each nostril as directed 30 mL 0    cholecalciferol, vitamin D3, 125 mcg (5,000 unit) Tab Take by mouth.      docusate sodium (COLACE) 100 MG capsule Take 1 capsule (100 mg total) by mouth 2 times a day as needed for Constipation. 60 capsule 0    ferrous sulfate 325 (65 FE) MG tablet Take 1 tablet (325 mg total) by mouth daily with breakfast. 90 tablet 1    lidocaine (LIDODERM) 5 % Place 1 patch onto the skin daily. Apply patch for 12 hours and then remove patch and leave off for 12 hours. 30 patch 0    lisinopriL (PRINIVIL) 5 MG tablet TAKE ONE TABLET BY MOUTH EVERY DAY NEW LOWER DOSE 90 tablet 1    multivitamin capsule Take 1 capsule by mouth daily.      polyethylene glycol (GOLYTELY) 236-22.74-6.74 -5.86 gram solution To Prep for colonoscopy. Drink 8oz every 20-30  minutes until the entire bottle is consumed. 4000 mL 0    pregabalin (LYRICA) 150 MG capsule Take 1 capsule (150 mg total) by mouth 2 times a day. 180 capsule 0    tadalafiL (CIALIS) 5 MG tablet Take 1 tablet (5 mg total) by mouth daily. USE GOOD RX 90 tablet 0     No facility-administered encounter medications on file as of 06/23/2023.        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    He has a past surgical history that includes Cervical fusion; Knee surgery; and Spine surgery (2003).    His family history includes Alzheimer's disease in his father; Cancer in his mother; Colon Cancer in his mother; Diabetes in his sister and sister.    He reports that he has never smoked. He has been exposed to tobacco smoke. He has never used smokeless tobacco. He reports that he does not currently use alcohol. He reports that he does not currently use drugs after having used the following drugs: Marijuana and Medical Marijuana.        Height 5' 11 (1.803 m), weight 202 lb (91.6 kg).  Physical Exam  Genitourinary:     Comments: After verbal consent was obtained, a digital anorectal examination was performed. The medical assistant was present for all parts of the examination to serve as a chaperone.     No significantly enlarged external hemorrhoids.  No visible fissure or other abnormalities.  Digital rectal examination without palpable masses.    After verbal consent was obtained and a timeout performed in the presence of a chaperone, a lubricated anoscope was inserted into the anus without difficulty. It was slowly withdrawn and the distal rectum and anal canal were evaluated. The patient tolerated the procedure well. The following findings were identified:    Left lateral and right posterior internal hemorrhoids are enlarged, hyperemic with chronic prolapse changes and the associated distal rectal mucosa also has chronic prolapse changes.           Assessment:  Bleeding, prolapsing internal hemorrhoids in the context of chronic constipation.    Plan:  Suggested he resume a fiber supplement.  Suggested a sugar-free preparation to minimize bloating.  Could also try MiraLAX instead of Colace if constipation persists.  He is seeing GI in November and I recommended he keep this appointment both for constipation  and for colonoscopy.  Follow-up for rubber band ligation of the internal portion of the hemorrhoids.  Discussed possibility of pain and needing multiple procedures.     Medical Decision Making  The following items were considered in medical decision making:  Obtain records and history from outside facility/provider  Reviewed outside records    This document was dictated using Dragon voice recognition software.  While every effort is made to proofread the document as it is being created, please excuse any transcription errors encountered in reading.    Referring MD: [name], MD    Patient Care Team:  [name], MD as PCP - General (Family Medicine)

## 2023-06-26 MED ORDER — atorvastatin (LIPITOR) 20 MG tablet
20 | ORAL_TABLET | Freq: Every day | ORAL | 1 refills | Status: AC
Start: 2023-06-26 — End: ?

## 2023-06-26 NOTE — Telephone Encounter (Signed)
 06/14/2023 Mitchell Juneau, MD  Visit date not found

## 2023-06-28 ENCOUNTER — Ambulatory Visit: Payer: Medicare (Managed Care) | Attending: Sports Medicine

## 2023-07-07 ENCOUNTER — Ambulatory Visit: Admit: 2023-07-07 | Discharge: 2023-07-11 | Payer: Medicare (Managed Care)

## 2023-07-07 DIAGNOSIS — K648 Other hemorrhoids: Secondary | ICD-10-CM

## 2023-07-07 NOTE — Progress Notes (Signed)
Chief Complaint   Patient presents with    Procedure     RBL        History of Present Illness  Returns for rubber band ligation of internal hemorrhoids.       Review of Systems   Constitutional:  Negative for chills, fatigue and fever.   Respiratory:  Negative for chest tightness, shortness of breath and wheezing.    Cardiovascular:  Negative for chest pain, palpitations and leg swelling.   Gastrointestinal:  Negative for abdominal pain, anal bleeding, blood in stool, constipation, diarrhea, nausea, rectal pain and vomiting.   Neurological:  Negative for dizziness, weakness and light-headedness.   Hematological:  Does not bruise/bleed easily.     Allergies  Patient has no known drug allergies or adverse reactions.    Medications  Outpatient Encounter Medications as of 07/07/2023   Medication Sig Dispense Refill    atorvastatin (LIPITOR) 20 MG tablet take one tablet by mouth every day 90 tablet 1    cholecalciferol, vitamin D3, 125 mcg (5,000 unit) Tab Take by mouth.      lidocaine (LIDODERM) 5 % Place 1 patch onto the skin daily. Apply patch for 12 hours and then remove patch and leave off for 12 hours. 30 patch 0    lisinopriL (PRINIVIL) 5 MG tablet TAKE ONE TABLET BY MOUTH EVERY DAY NEW LOWER DOSE 90 tablet 1    multivitamin capsule Take 1 capsule by mouth daily.      pregabalin (LYRICA) 150 MG capsule Take 1 capsule (150 mg total) by mouth 2 times a day. 180 capsule 0    tadalafiL (CIALIS) 5 MG tablet Take 1 tablet (5 mg total) by mouth daily. USE GOOD RX 90 tablet 0    azelastine (ASTELIN) 137 mcg (0.1 %) nasal spray Use 1 spray into each nostril 2 times a day. Use in each nostril as directed 30 mL 0    docusate sodium (COLACE) 100 MG capsule Take 1 capsule (100 mg total) by mouth 2 times a day as needed for Constipation. 60 capsule 0    ferrous sulfate 325 (65 FE) MG tablet Take 1 tablet (325 mg total) by mouth daily with breakfast. 90 tablet 1    polyethylene glycol (GOLYTELY) 236-22.74-6.74 -5.86 gram  solution To Prep for colonoscopy. Drink 8oz every 20-30 minutes until the entire bottle is consumed. 4000 mL 0    [DISCONTINUED] atorvastatin (LIPITOR) 20 MG tablet Take 1 tablet (20 mg total) by mouth daily. 90 tablet 1    [DISCONTINUED] ferrous sulfate 325 (65 FE) MG tablet Take 1 tablet (325 mg total) by mouth daily with breakfast. 90 tablet 1     No facility-administered encounter medications on file as of 07/07/2023.        Histories  He has a past medical history of Essential hypertension, Hepatitis C 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 (08/16/2021), Hyperlipidemia, Low serum HDL (08/16/2021), [name] (left ventricular hypertrophy) (11/28/2022), Nonrheumatic mitral valve regurgitation (11/28/2022), Normocytic anemia (11/09/2021), Prediabetes (08/16/2021), and Spinal stenosis of cervical region.    He has a past surgical history that includes Cervical fusion; Knee surgery; and Spine surgery (2003).    His family history includes Alzheimer's disease in his father; Cancer in his mother; Colon Cancer in his mother; Diabetes in his sister and sister.    He reports that he has never smoked. He has been exposed to tobacco smoke. He has never used smokeless tobacco. He reports that he does not currently use alcohol. He reports that he does  not currently use drugs after having used the following drugs: Marijuana and Medical Marijuana.        Height 5' 11 (1.803 m), weight 201 lb 3.2 oz (91.3 kg).  Physical Exam        ELASTIC LIGATION INTERNAL HEMORRHOIDS    The patient demonstrated informed consent after we discussed the risks and benefits of the procedure which would include but not be limited to bleeding, need for surgery, infection and recurrence of symptoms.  We also discussed alternatives which would include surgical hemorrhoidectomy, and expectant management.      While in the knee-chest position the anoscope was inserted and the right posterior hemorrhoid identified.  Two bands were then placed above the dentate line  incorporating the hemorrhoidal tissue.  Upon completion the bands were noted above the dentate line, incorporating a mushroom of tissue.    The left lateral was similarly treated.    The patient tolerated the procedure well and was given post operative instructions.       Assessment:  Bleeding, prolapsing internal hemorrhoids.  Treated with rubber band ligation today.  Tolerated well.  Does have some remaining enlarged internal hemorrhoids posterior to the right posterior and right anterior.    Plan:  Call me and follow-up for further rubber band ligation if symptoms fail to resolve.       This document was dictated using Conservation officer, historic buildings.  While every effort is made to proofread the document as it is being created, please excuse any transcription errors encountered in reading.    Referring MD: [name], MD    Patient Care Team:  [name], MD as PCP - General (Family Medicine)

## 2023-07-18 MED ORDER — pregabalin (LYRICA) 150 MG capsule
150 | ORAL_CAPSULE | Freq: Two times a day (BID) | ORAL | 0 refills | Status: AC
Start: 2023-07-18 — End: 2023-10-22

## 2023-07-18 NOTE — Telephone Encounter (Signed)
[name] was pulled, last filled 04/25/23 90 day supply.  06/14/2023 [name], MD  Visit date not found

## 2023-07-19 MED ORDER — tadalafiL (CIALIS) 5 MG tablet
5 | ORAL_TABLET | Freq: Every day | ORAL | 0 refills | Status: AC
Start: 2023-07-19 — End: 2023-07-20

## 2023-07-19 NOTE — Telephone Encounter (Signed)
06/14/2023 [name], MD  08/01/2023 [name], MD

## 2023-07-21 MED ORDER — tadalafiL (CIALIS) 5 MG tablet
5 | ORAL_TABLET | Freq: Every day | ORAL | 0 refills
Start: 2023-07-21 — End: 2023-08-18

## 2023-07-21 NOTE — Telephone Encounter (Signed)
06/14/2023 [name], MD  08/01/2023 [name], MD

## 2023-08-01 ENCOUNTER — Other Ambulatory Visit: Admit: 2023-08-01 | Payer: Medicare (Managed Care)

## 2023-08-01 ENCOUNTER — Ambulatory Visit: Admit: 2023-08-01 | Payer: Medicare (Managed Care)

## 2023-08-01 DIAGNOSIS — Z0000 Encounter for general adult medical examination without abnormal findings: Secondary | ICD-10-CM

## 2023-08-01 LAB — COMPREHENSIVE METABOLIC PANEL, SERUM
ALT: 24 U/L (ref 7–52)
AST (SGOT): 32 U/L (ref 13–39)
Albumin: 4 g/dL (ref 3.5–5.7)
Alkaline Phosphatase: 76 U/L (ref 36–125)
Anion Gap: 7 mmol/L (ref 3–16)
BUN: 10 mg/dL (ref 7–25)
CO2: 30 mmol/L (ref 21–33)
Calcium: 8.9 mg/dL (ref 8.6–10.3)
Chloride: 106 mmol/L (ref 98–110)
Creatinine: 0.75 mg/dL (ref 0.60–1.30)
EGFR: >90
Glucose: 85 mg/dL (ref 70–100)
Osmolality, Calculated: 294 mosm/kg (ref 278–305)
Potassium: 4.6 mmol/L (ref 3.5–5.3)
Sodium: 143 mmol/L (ref 133–146)
Total Bilirubin: 0.4 mg/dL (ref 0.0–1.5)
Total Protein: 7.3 g/dL (ref 6.4–8.9)

## 2023-08-01 LAB — CBC
Hematocrit: 31.6 % — ABNORMAL LOW (ref 38.5–50.0)
Hemoglobin: 10.5 g/dL — ABNORMAL LOW (ref 13.2–17.1)
MCH: 29.8 pg (ref 27.0–33.0)
MCHC: 33.1 g/dL (ref 32.0–36.0)
MCV: 90.1 fL (ref 80.0–100.0)
MPV: 10.3 fL (ref 7.5–11.5)
Platelets: 230 10*3/uL (ref 140–400)
RBC: 3.51 10*6/uL — ABNORMAL LOW (ref 4.20–5.80)
RDW: 15.4 % — ABNORMAL HIGH (ref 11.0–15.0)
WBC: 9.8 10*3/uL (ref 3.8–10.8)

## 2023-08-01 LAB — LIPID PANEL
Cholesterol, Total: 132 mg/dL (ref 0–200)
HDL: 35 mg/dL — ABNORMAL LOW (ref 60–92)
LDL Cholesterol: 66 mg/dL
Non-HDL Cholesterol, Calculated: 97 mg/dL (ref 0–129)
Triglycerides: 157 mg/dL — ABNORMAL HIGH (ref 10–149)

## 2023-08-01 LAB — PSA TOTAL, SCREENING: PSA: 5.29 ng/mL — ABNORMAL HIGH (ref 0.0–4.0)

## 2023-08-01 LAB — HEMOGLOBIN A1C: Hemoglobin A1C: 5 % (ref 4.0–5.6)

## 2023-08-01 NOTE — Progress Notes (Signed)
[facility]  [phone]  [place] [address]    Name:  Mitchell Evans Date of Birth: Dec 16, 1960 (62 y.o.)   MRN: [id number]    Date of Service:  08/01/2023       Subjective:     History of Present Illness:  Mitchell Evans is a(n) 62 y.o. male here today for annual wellness exam.        11/24/2022 07/30/2023   HRA/AWV (Annual Wellness Visit Questionnaire)   Have things been going well for you in the past four weeks? Yes Yes   During the past 4 weeks, would you rate your health in general as good to excellent? Yes No   In the past 4 weeks, have you been bothered by bodily pains? Yes Yes   Are you able to do at least moderate physical activity for up to two minutes? (such as walking up hill) Yes Yes   Do you suffer from feeling tired or fatigued? No No   Do you have trouble eating well? No No   Do you have any problems with your teeth or dentures? Yes Yes   Have you ever had deafness or trouble hearing in one or both ears? No No   Do you have any trouble using the phone? No No   In the past 2 weeks, have you had less pleasure or interest in things than usual?  No No   In the past 2 weeks, have you been more down, depressed or hopeless?  No No   In the past 4 weeks, was someone available if you needed and asked for help? (someone to talk to, tasks around the house, help taking care of yourself) Yes No   Can you get to places out of walking distance without help? (For example, can you travel alone on buses or taxis, or drive your own car?) Yes No   Can you go shopping for groceries or clothes without someones help? Yes Yes   Can you prepare your own meals? Yes Yes   Can you do your housework without help? Yes No   Because of any health problems, do you need the help of another person with your personal care needs such as eating, bathing, dressing, or getting around the house? No No   Can you handle your own money without help? Yes Yes   Are you confident that you can  control and manage most of your health problems? Yes No   Do you take medication?  Yes Yes   Do you ever have problems taking medications as prescribed? No No   In the past 12 months, have you had five or more alcoholic drinks in 1 day?   No No   Are you a smoker? No No   Do you or have you had an opioid use disorder? No No   Are you currently prescribed opioid pain medication? No No   Do you exercise at least 3 days a week? Yes Yes   Do you drive?     Yes Yes   Are you having any difficulties when driving (getting lost, accidents, less confident)? No No   Do you always fasten your seat belt in a car? Yes Yes   Do you feel safe at home? Yes Yes   Have you fallen 2 or more times in the past year? No No   Are you afraid of falling?  Yes No   Has your  home been evaluated to reduce the risk for falls?  No No   Do you sometimes get dizzy when standing up? No No      Denies fever, unintentional weight loss.  Denies CP, palpitations, SOB or cough.  Denies abdominal pain, blood in stool, diarrhea or constipation.  Denies leg swelling.      I have reviewed the Health Risk Assessment Questionnaire completed by patient including depression and alcohol screening.  Comments:     Depression screening: performed, negative  Alcohol misuse screening: performed, negative  Memory Screening  Mini-cog:Pass     Other HPI / Medical issues or concerns for this visit:  HPI    Opioid use disorder: No  Opioid use: No    The following portions of the patient's history were reviewed and updated as appropriate:  past medical history, past surgical history, current medications, allergies, social history, family history, and immunization history    Patient Care Team:  [name], MD as PCP - General (Family Medicine)    Review of Systems         Objective:     Vitals:    08/01/23 1342   BP: 123/78   BP Location: Left upper arm   Patient Position: Sitting   BP Cuff Size: Regular   Pulse: 74   SpO2: 97%   Weight: 203 lb (92.1 kg)   Height: 5' 10  (1.778 m)     Body mass index is 29.13 kg/m.    Vision/hearing screen:No results found.    Physical Exam    General: Well appearing, well nourished, well developed. No acute distress.  Skin: No rash or suspicious lesions  HEENT: oral pharynx normal, TMs normal, no thyromegaly  Lymphatic: No LAD of anterior cervical and supraclavicular  Cardiac: Regular rate and rhythm. No murmurs rubs or gallops.  Normal S1 and S2.  No JVD. Radial pulse strong  Respiratory: Clear to auscultation bilateral.  No respiratory distress.  No tachypnea  Abdominal: Soft, non-tender, non-distended, flat, normal BS, No masses or HSM   Vascular: No edema  Neuro: 5/5 strength in all extremities  Psych: Alert and oriented.  Normal mood and affect       Assessment/Plan:     Mitchell Evans is a 62 y.o. here for Annual Wellness Visit    Patient Counseling and Personalized Prevention Plan    Risk Factors and Conditions Identified:   No additional risk factors or conditions identified.      Recommended Interventions or Referrals:  Orders Placed This Encounter    CBC    Comprehensive Metabolic Panel, Serum    Hemoglobin A1c    Lipid Profile    PSA Total, Screening      Discussion of advance directives:  Advance directives were discussed today and patient will complete new documents to be returned at a later date.    Discussion:  Discussed ADL support.  Diet counseling    Exercise counseling     Discussed psychosocial needs.  Discussed prevention of chronic disease and screening schedule reviewed.  Management and coordination of services     Goals    None       Health Maintenance   Upcoming Health Maintenance       Colorectal Cancer Screening (MyChart) (Colonoscopy - Every 5 Years)  Scheduled for 09/12/2023    Comprehensive Physical Exam (Yearly)  Overdue since 11/08/2022    Renal Function/GFR (Yearly)  Next due on 12/27/2023    Depression Screening (Yearly)  Next due  on 07/29/2024    Annual Medicare Wellness Visit (Yearly)  Next due on 07/31/2024     Diabetes Screening (Every 3 Years)  Ordered on 08/01/2023    Immunization: DTaP/Tdap/Td (2 - Td or Tdap)  Next due on 11/15/2027          Additional Medical Problems addressed today    Problem List Items Addressed This Visit    None  Visit Diagnoses       Well adult exam    -  Primary    Relevant Orders    CBC    Comprehensive Metabolic Panel, Serum    Hemoglobin A1c    Lipid Profile    PSA Total, Screening           BMI is above normal. Weight loss not recommended at this time.                  [name], MD

## 2023-08-02 ENCOUNTER — Ambulatory Visit: Payer: Medicare (Managed Care) | Attending: Sports Medicine

## 2023-08-02 NOTE — Telephone Encounter (Signed)
Pt returned call. Message given

## 2023-08-02 NOTE — Telephone Encounter (Signed)
Lmtcb 08/02/2023

## 2023-08-02 NOTE — Telephone Encounter (Signed)
Call pt    Your [name] is low.  This is your good cholesterol and helps protect you from heart disease.  Regular exercise, weight loss and substituting healthy fats like avocado and olive oil for less healthy options (saturated fat) and eating more whole grains and beans can help    PSA 5 which is mildly high and needs to see Urology to see if prostate biopsy needed.  For appointments, please call [phone].    Anemia worsened with Hg 10.5 - needs to check further labs - no appt needed, ok to eat    [name], MD   Primary Care [facility]  .

## 2023-08-03 ENCOUNTER — Ambulatory Visit: Admit: 2023-08-03 | Discharge: 2023-08-07 | Payer: Medicare (Managed Care)

## 2023-08-03 ENCOUNTER — Other Ambulatory Visit: Admit: 2023-08-03 | Payer: Medicare (Managed Care)

## 2023-08-03 DIAGNOSIS — D500 Iron deficiency anemia secondary to blood loss (chronic): Secondary | ICD-10-CM

## 2023-08-03 LAB — IRON STUDIES
% Iron Saturation: 4.9 % — ABNORMAL LOW (ref 15.0–55.0)
Iron: 20 ug/dL — ABNORMAL LOW (ref 50–212)
TIBC: 409 ug/dL (ref 261–462)

## 2023-08-03 LAB — HEMOGLOBINOPATHY EVALUATION
Hgb A2 Quant: 2.1 %
Hgb A: 97.9 %

## 2023-08-03 LAB — FERRITIN: Ferritin: 6.6 ng/mL — ABNORMAL LOW (ref 23.9–336.2)

## 2023-08-03 MED ORDER — ferrous sulfate 325 (65 FE) MG tablet
325 | ORAL_TABLET | Freq: Every day | ORAL | 1 refills | Status: AC
Start: 2023-08-03 — End: ?

## 2023-08-03 NOTE — Progress Notes (Signed)
Pre-Visit Preventative Plannning  Lab Results   Component Value Date    HGBA1C 5.0 08/01/2023     Health Maintenance   Topic Date Due    Colorectal Cancer Screening (MyChart)  07/18/2022    Renal Function/GFR  07/31/2024    Depression Screening  07/31/2024    Annual Medicare Wellness Visit  07/31/2024    Comprehensive Physical Exam  07/31/2024    Diabetes Screening  07/31/2026    Immunization: DTaP/Tdap/Td (2 - Td or Tdap) 11/15/2027    Hepatitis C Screening (MyChart)  Completed    HIV Screening  Completed    Immunization: Influenza (MyChart)  Completed    Immunization: Zoster  Completed    Immunization: RSV (Adult)  Completed    Immunization: Pneumococcal  Aged Out     Immunization History   Administered Date(s) Administered    COVID-19, mRNA, Moderna monovalent, age 11+ 06/26/2023    COVID-19, mRNA, Pfizer bivalent booster, tris-sucrose, age 11+ 07/17/2021    COVID-19, mRNA, Pfizer monovalent 12/25/2019, 01/22/2020, 07/14/2020, 01/14/2021    COVID-19, mRNA, tris-sucrose, age 34+ (COMIRNATY) 08/04/2022    Influenza, quadrivalent, intradermal, preservative-free 06/26/2023    Influenza, quadrivalent, preservative-free 06/26/2018, 05/31/2019, 06/15/2020, 07/17/2021    Influenza, unspecified 06/28/2022    Pneumococcal polysaccharide, 23-valent 06/16/2020    RSV, recombinant, prefusion F, adjuvant (Arexvy) 06/28/2022    Zoster, recombinant 09/22/2020, 01/19/2021    tdap 11/14/2017     CC:   Chief Complaint   Patient presents with    Follow-up     HPI: See A&P for details of HPI    ROS: Please see HPI    Home Meds:  Current Outpatient Medications   Medication Sig    atorvastatin take one tablet by mouth every day    azelastine Use 1 spray into each nostril 2 times a day. Use in each nostril as directed    cholecalciferol (vitamin D3) Take by mouth.    docusate sodium Take 1 capsule (100 mg total) by mouth 2 times a day as needed for Constipation.    ferrous sulfate Take 1 tablet (325 mg total) by mouth daily with  breakfast.    lidocaine Place 1 patch onto the skin daily. Apply patch for 12 hours and then remove patch and leave off for 12 hours.    lisinopriL TAKE ONE TABLET BY MOUTH EVERY DAY NEW LOWER DOSE    multivitamin Take 1 capsule by mouth daily.    polyethylene glycol To Prep for colonoscopy. Drink 8oz every 20-30 minutes until the entire bottle is consumed.    pregabalin Take 1 capsule (150 mg total) by mouth 2 times a day.    tadalafiL Take 1 tablet (5 mg total) by mouth daily. USE GOOD RX     No current facility-administered medications for this visit.     PE:  BP 139/77 (BP Location: Right upper arm, Patient Position: Sitting, BP Cuff Size: Regular)   Pulse 75   Ht 5' 10 (1.778 m)   Wt 203 lb 12.8 oz (92.4 kg)   SpO2 97%   BMI 29.24 kg/m   Wt Readings from Last 3 Encounters:   08/03/23 203 lb 12.8 oz (92.4 kg)   08/01/23 203 lb (92.1 kg)   07/07/23 201 lb 3.2 oz (91.3 kg)     Today's body mass index is 29.24 kg/m.     Gen: No acute distress, well developed, well nourished  HEENT: Normocephalic, atraumatic  CV: Hemodynamically stable  Resp: No tachypnea or increased WOB  Neuro: A&O x 3, answering questions appropriately  Psych: Normal mood and affect, behavior appropriate    A/P: Mitchell Evans is a 62 y.o. male here for review of abnormal labs from physical - anemia likely from rectal bleeding from hemorroids now treated, should resolve by 3 months.  Reviewed CBC, PSA from 08/01/23    Mitchell Evans was seen today for follow-up.    Diagnoses and all orders for this visit:    Iron deficiency anemia due to chronic blood loss (Primary)  -     Hemoglobinopathy evaluation; Future  -     ferrous sulfate 325 (65 FE) MG tablet; Take 1 tablet (325 mg total) by mouth daily with breakfast.    Mitchell Juneau, MD  [facility]

## 2023-08-07 ENCOUNTER — Ambulatory Visit: Payer: Medicare (Managed Care)

## 2023-08-17 NOTE — Telephone Encounter (Signed)
Send meds as discussed.

## 2023-08-18 MED ORDER — tadalafiL (CIALIS) 10 MG tablet
10 | Freq: Every day | ORAL | 1.00 refills | 30.00000 days | Status: AC | PRN
Start: 2023-08-18 — End: 2023-09-20

## 2023-08-18 MED ORDER — tamsulosin (FLOMAX) 0.4 mg Cap
0.4 | ORAL_CAPSULE | Freq: Every day | ORAL | 1 refills | Status: AC
Start: 2023-08-18 — End: ?

## 2023-08-23 ENCOUNTER — Ambulatory Visit: Payer: Medicare (Managed Care) | Attending: Sports Medicine

## 2023-08-25 ENCOUNTER — Other Ambulatory Visit: Admit: 2023-08-25 | Payer: Medicare (Managed Care)

## 2023-08-25 ENCOUNTER — Ambulatory Visit: Admit: 2023-08-25 | Discharge: 2023-08-29 | Payer: Medicare (Managed Care)

## 2023-08-25 DIAGNOSIS — R972 Elevated prostate specific antigen [PSA]: Secondary | ICD-10-CM

## 2023-08-25 LAB — PSA, TOTAL AND FREE
PSA, Free Pct: 10 %
PSA, Free: 0.37 ng/mL
PSA: 3.7 ng/mL (ref 0.0–4.0)

## 2023-08-25 NOTE — Progress Notes (Signed)
UROLOGY H&P NOTE    Patient: Mitchell Evans  Admit Date: (Not on file)  Location: Room/bed info not found    Chief Complaint: NPV - Elevated PSA    HPI: Mitchell Evans is a 62 y.o. male with a PMHx of bleeding internal hemorrhoids, HTN, hyperlipidemia, left ventricular hypertrophy, and other medical issues as listed below who was referred by [facility] for evaluation of elevated PSA.   - PSA 11/08/21: 3.7   - PSA 08/01/23: 5.29       Today, patient states he is doing well.   He is taking Flomax 0.4 mg nightly prescribed by PCP. Previously using Cialis 5 mg daily. He recently underwent rubber band ligation of internal hemorrhoids and states his urination is much improved as his abdominal pressure is improved.     LUTS: Patient states his LUTS are not bothersome on Flomax.   ED: Yes - using Cialis 10 mg. Failed Viagra 100 mg.     Family history of GU cancer: Maternal uncle - bladder cancer (died due to complications)      Medical History:  Past Medical History:   Diagnosis Date    Elevated prostate specific antigen (PSA) 08/02/2023    Essential hypertension     Hepatitis C 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 08/16/2021    Needs confirmed    Hyperlipidemia     Low serum HDL 08/16/2021    [name] (left ventricular hypertrophy) 11/28/2022    Nonrheumatic mitral valve regurgitation 11/28/2022    Normocytic anemia 11/09/2021    Prediabetes 08/16/2021    Spinal stenosis of cervical region        Past Surgical History:   Procedure Laterality Date    CERVICAL FUSION      KNEE SURGERY      SPINE SURGERY  2003       Medications:   Outpatient Meds:  Previous Medications    ATORVASTATIN (LIPITOR) 20 MG TABLET    take one tablet by mouth every day    AZELASTINE (ASTELIN) 137 MCG (0.1 %) NASAL SPRAY    Use 1 spray into each nostril 2 times a day. Use in each nostril as directed    CHOLECALCIFEROL, VITAMIN D3, 125 MCG (5,000 UNIT) TAB    Take by mouth.    DOCUSATE SODIUM (COLACE) 100 MG CAPSULE    Take 1 capsule (100 mg total) by mouth 2  times a day as needed for Constipation.    FERROUS SULFATE 325 (65 FE) MG TABLET    Take 1 tablet (325 mg total) by mouth daily with breakfast.    LIDOCAINE (LIDODERM) 5 %    Place 1 patch onto the skin daily. Apply patch for 12 hours and then remove patch and leave off for 12 hours.    LISINOPRIL (PRINIVIL) 5 MG TABLET    TAKE ONE TABLET BY MOUTH EVERY DAY NEW LOWER DOSE    MULTIVITAMIN CAPSULE    Take 1 capsule by mouth daily.    POLYETHYLENE GLYCOL (GOLYTELY) 236-22.74-6.74 -5.86 GRAM SOLUTION    To Prep for colonoscopy. Drink 8oz every 20-30 minutes until the entire bottle is consumed.    PREGABALIN (LYRICA) 150 MG CAPSULE    Take 1 capsule (150 mg total) by mouth 2 times a day.    TADALAFIL (CIALIS) 10 MG TABLET    Take 1-2 tablets (10-20 mg total) by mouth daily as needed for Erectile Dysfunction.    TAMSULOSIN (FLOMAX) 0.4 MG CAP    Take 1 capsule (0.4  mg total) by mouth daily.       Inpatient Meds:  Scheduled:  Continuous:  PRN:    Allergies: No Known Drug Allergies or Adverse Reactions    SH:   Social History     Tobacco Use    Smoking status: Never     Passive exposure: Past    Smokeless tobacco: Never   Substance Use Topics    Alcohol use: Not Currently       FH: Noncontributory.    ROS: As per HPI. Otherwise negative.    Objective:  Vitals:    08/25/23 0751   BP: 180/60   Pulse: 78   SpO2: 98%        Physical Examination:  Gen: No apparent distress, alert and oriented x3  CV: regular rate   Chest: No respiratory distress    Labs:  No results for input(s): WBC, HGB, HCT, PLT in the last 72 hours.  No results for input(s): NA, K, CL, CO2, BUN, CREATININE, GLUCOSE, CALCIUM, MG, PHOS in the last 72 hours.  No results for input(s): INR, PROTIME in the last 72 hours.  PSA   Date Value Ref Range Status   08/01/2023 5.29 (H) 0.0 - 4.0 ng/mL Final   11/08/2021 3.70 0.0 - 4.0 ng/mL Final       Lab Results   Component Value Date    COLORU Colorless (A) 11/24/2022    CLARITYU Clear  11/24/2022    PROTEINUA Negative 11/24/2022    PHUR 6.0 11/24/2022    LABSPEC 1.009 11/24/2022    GLUCOSEU Negative 11/24/2022    BLOODU Negative 11/24/2022    LEUKOCYTESUR Negative 11/24/2022    NITRITE Negative 11/24/2022    BILIRUBINUR Negative 11/24/2022    UROBILINOGEN <2.0 11/24/2022    RBCUA 2 06/30/2022    WBCUA 14 (H) 06/30/2022    BACTERIA Rare (A) 06/30/2022       Imaging:   No results found.    Assessment/Plan:     ICD-10-CM    1. Elevated prostate specific antigen (PSA)  R97.20         Mitchell Evans is a 62 y.o. male with a PMHx of bleeding internal hemorrhoids, HTN, hyperlipidemia, left ventricular hypertrophy, and other medical issues as listed below who was referred by [facility] for evaluation of elevated PSA.   - PSA 11/08/21: 3.7   - PSA 08/01/23: 5.29       - He is taking Flomax 0.4 mg nightly prescribed by PCP. Previously using Cialis 5 mg daily.   - He recently underwent rubber band ligation of internal hemorrhoids and states his urination is much improved as his abdominal pressure is improved.   - LUTS: Patient states his LUTS are not bothersome on Flomax.   - ED: Yes - using Cialis 10 mg. Failed Viagra 100 mg.     - Family history of GU cancer: Maternal uncle - bladder cancer (died due to complications)      Discussed the implications of elevated PSA and risk of prostate cancer. Recommended further evaluation by transrectal ultrasound guided prostate needle biopsy vs Prostate MRI followed by fusion biopsy (if MRI demonstrates suspicious lesions). Details and pro/cons of each approach discussed. Patient would like to proceed with MRI of the prostate.     Discussed the indications, risks, and benefits of biopsy, including potential side effects of blood in urine / stool / semen, as well as risk of infection / sepsis      Plan:   -  PSA check today   - MRI of prostate   - Proceed with biopsy if suspicious lesions are seen on MRI     Tomma Lightning, PA-C  08/25/2023

## 2023-09-01 ENCOUNTER — Ambulatory Visit: Admit: 2023-09-01 | Discharge: 2023-09-05 | Payer: Medicare (Managed Care) | Attending: Adult Health

## 2023-09-01 DIAGNOSIS — D509 Iron deficiency anemia, unspecified: Secondary | ICD-10-CM

## 2023-09-01 DIAGNOSIS — D500 Iron deficiency anemia secondary to blood loss (chronic): Secondary | ICD-10-CM

## 2023-09-01 NOTE — Patient Instructions (Signed)
MEDICATION CHANGES:  Continue daily fiber supplement.  Aim for 64 oz water daily.   Continue Miralax 1 capful every other day- can increase to daily if stools too hard.     Appointment:  Please make an appointment at the front desk 4 weeks post procedure.     Tests/Lab work  EGD/colonoscopy to be scheduled.

## 2023-09-01 NOTE — Progress Notes (Signed)
GASTROENTEROLOGY OFFICE VISIT    Mitchell Evans is 62 y.o. male presents for NPV, referred per iron deficiency anemia.     PMH: HTN, [name], [name], anemia, bipolar disorder   PSH: spinal surgery, cervical fusion   Fam Hx: CRC in Mother (diagnosed age 51's)   Social: Retired. Married- Catering manager. Marijuana- daily.     NPV (08/31/2023)    Here today for NPV to discuss colonoscopy   Hx of iron deficiency anemia  He has hx of rectal bleeding - saw colorectal surgery with prolapsing internal hemorrhoids s/p rubber band ligation with Dr. Janee Evans (07/07/2023)   He is taking oral iron supplement daily   He is having a bowel movement on average 2-3 per day   He denies any straining to have a bowel movement   Stools are usually Bristol type 4 but can see type 2/3   He denies any blood in the stools   He takes a daily fiber supplement   He takes Miralax every other day   Denies abdominal pain, nausea, or vomiting   Denies fever, chills, or weight loss   He has a history of mild, acute uncomplicated sigmoid diverticulitis in September 2023     Procedure history:    EGD- None prior     [facility] 2018 [place]:    October 2018 in [place], [place]. 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 There were internal hemorrhoids on retroflexion. 5-year follow-up was recommended.     Testing history:    Iron studies 08/03/23:  Iron 20  Iron sat 4.9%  Ferritin 6.6       CT a/p w/o contrast:   Findings suspicious for mild, acute uncomplicated sigmoid diverticulitis.     Review of Systems   Constitutional:  Negative for chills, fever, malaise/fatigue and weight loss.   Respiratory: Negative.  Negative for cough, shortness of breath and wheezing.    Cardiovascular: Negative.  Negative for chest pain, palpitations and leg swelling.   Gastrointestinal:  Positive for constipation. Negative for abdominal pain, blood in stool, diarrhea,  heartburn, melena, nausea and vomiting.   Genitourinary: Negative.    Neurological: Negative.       Physical Exam  Vitals and nursing note reviewed.   Constitutional:       Appearance: Normal appearance.   HENT:      Head: Normocephalic and atraumatic.   Cardiovascular:      Rate and Rhythm: Normal rate and regular rhythm.   Pulmonary:      Effort: Pulmonary effort is normal.      Breath sounds: Normal breath sounds.   Abdominal:      General: Abdomen is flat.      Palpations: Abdomen is soft.   Skin:     General: Skin is warm and dry.   Neurological:      General: No focal deficit present.      Mental Status: He is alert and oriented to person, place, and time.   Psychiatric:         Mood and Affect: Mood normal.         Behavior: Behavior normal.         Thought Content: Thought content normal.         Judgment: Judgment normal.      Assessment/Plan:     [name], [name], [name], anemia, bipolar disorder, chronic marijuana use  here for NPV to discuss iron deficiency anemia, colonoscopy.     1. History of colon polyps  2. Family history of colon cancer in mother  3. History of sigmoid diverticulitis.   -- Hx of colon cancer in Mother, diagnosed ~ 45's. Last colonoscopy performed in [place] in 2018- October 2018 in [place], [place]. 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 There were internal hemorrhoids on retroflexion. 5-year follow-up was recommended.   -- Episode of uncomplicated sigmoid diverticulitis in 06/2022 per CT A/P.   -- We discussed colonoscopy procedure including bowel prep, risks/benefits.   -- Colonoscopy to be scheduled.     4. Iron deficiency anemia, unspecified iron deficiency anemia type (Primary)  -- Hx of iron deficiency anemia. Labs from 10/2021 noting a Hgb 12.8.   -- Current iron studies reflect an iron deficiency anemia. Pt does have a history of bleeding  internal hemorrhoids s/p recent rubber band ligation. Will evaluate for upper GI source of blood loss with EGD as it is unclear if the degree of blood loss from the hemorrhoids as well as previous COLO noting aphthous appearing erosions in the TI.   -- EGD to be scheduled.     5. Chronic constipation  -- Chronic. Suspect slow transit. Currently managed on Miralax 17 gm three times weekly and daily psyllium fiber. Continue both. May increase Miralax use to daily if stools become too firm. Aim for 64 oz water daily. High fiber diet.     RTO 4 weeks post procedure.     Mitchell Manfredi KRISTEN Lener Ventresca, CNP   08/31/2023

## 2023-09-01 NOTE — Telephone Encounter (Signed)
Scheduled with Dr. Derrill Center, MD  Procedure: Colonoscopy + EGD  Indication: Hx of polyps + fam hx of CRC + iron def anemia  Date: 11/13/2023  Time: 8:30 am  Arrival: 7:00 am  Anesthesia Type: MAC  Length of procedure: 60 minutes  Prep Type: Miralax / Dulcolax  Interpreter Needed: No  Patient info sent: Yes  Pharmacy Rx electronically sent to: Yes  Acknowledge a driver needed: Yes  Important Issues or Needs: N/A  Anticoagulation: No    Discussed patient calling insurance to verify coverage. Patient verbally acknowledged patients responsibility for understanding their personal coverage and benefits by contacting their insurance provider.   Confirmed patients preferred communication.  Sent Mychart link via e-mail if patient has access and it is not set up

## 2023-09-03 ENCOUNTER — Ambulatory Visit: Payer: Medicare (Managed Care)

## 2023-09-08 MED ORDER — bisacodyL 5 mg Tab
5 | ORAL_TABLET | ORAL | 0 refills | Status: AC
Start: 2023-09-08 — End: ?

## 2023-09-08 MED ORDER — polyethylene glycol (GLYCOLAX) 17 gram/dose powder
17 | ORAL | 0 refills | 14.00000 days | Status: AC
Start: 2023-09-08 — End: ?

## 2023-09-08 NOTE — Telephone Encounter (Signed)
 Called the patient to let him know that there was a mistake on his letter. No answer. LVM explaining the situation, sent him an updated letter through My Chart.    Scheduled with Dr. Derrill Center, MD  Procedure: Colonoscopy + EGD  Indication: Hx of po

## 2023-09-12 ENCOUNTER — Ambulatory Visit: Payer: Medicare (Managed Care) | Attending: Adult Health

## 2023-09-19 ENCOUNTER — Ambulatory Visit: Payer: Medicare (Managed Care) | Attending: Medical

## 2023-09-20 ENCOUNTER — Ambulatory Visit: Admit: 2023-09-20 | Discharge: 2023-09-20 | Payer: Medicare (Managed Care) | Attending: Medical

## 2023-09-20 ENCOUNTER — Inpatient Hospital Stay: Payer: Medicare (Managed Care) | Attending: Medical

## 2023-09-20 DIAGNOSIS — M542 Cervicalgia: Secondary | ICD-10-CM

## 2023-09-20 DIAGNOSIS — M25811 Other specified joint disorders, right shoulder: Secondary | ICD-10-CM

## 2023-09-20 MED ORDER — lisinopriL (PRINIVIL) 5 MG tablet
5 | ORAL_TABLET | ORAL | 1 refills | Status: AC
Start: 2023-09-20 — End: ?

## 2023-09-20 MED ORDER — triamcinolone acetonide (KENALOG-40) injection 80 mg
40 | Freq: Once | INTRAMUSCULAR | Status: AC
Start: 2023-09-20 — End: 2023-09-20
  Administered 2023-09-20: 21:00:00 via INTRA_ARTICULAR

## 2023-09-20 MED ORDER — BUPivacaine HCl (MARCAINE) 0.5 % (5 mg/mL) injection 20 mg
0.5 | Freq: Once | INTRAMUSCULAR | Status: AC
Start: 2023-09-20 — End: 2023-09-20
  Administered 2023-09-20: 21:00:00 via SUBCUTANEOUS

## 2023-09-20 MED ORDER — tadalafiL (CIALIS) 10 MG tablet
10 | ORAL_TABLET | Freq: Every day | ORAL | 2 refills | PRN
Start: 2023-09-20 — End: 2023-11-04

## 2023-09-20 NOTE — Telephone Encounter (Signed)
 08/03/2023 Mitchell Juneau, MD      Visit date not found

## 2023-09-20 NOTE — Telephone Encounter (Signed)
 08/03/2023 [name], MD      Visit date not found

## 2023-09-20 NOTE — Progress Notes (Signed)
This injection is documented for Thurston Pounds, PA.    Per provider's instructions, the patient was prepped for a right shoulder injection by Thurston Pounds, PA  - as well as drug and  allergies were reviewed.   The appropriate medication was drawn, and documented.  A medical assistant was also present to assist with the injection. The injection was administered by Thurston Pounds, PA. The patient was then educated on what to expect following the injection.   All questions were answered.    A verbal consent was obtained and a verbal timeout was performed on 09/20/2023 at 1:32 PM.     Herschel Senegal, MA

## 2023-09-20 NOTE — Progress Notes (Signed)
Chief Complaint: R shoulder pain    Subjective:   Jairius Arseneau is a 62 y.o. who presents for evaluation of R shoulder pain for a few months. The pain is intermittent. Majority of pain is in the R [facility] joint and occurs mainly in the colder weather. He exercises regularly and does not have any limitations. He has a history of ACDF. Denies any arm symptoms or neck pain.     ROS: All others negative except for as stated in HPI    The following portions of the patient's history were reviewed and updated as appropriate: allergies, current medications, past medical history, past surgical history, and past social history.    Objective:  There were no vitals filed for this visit.  Physical Exam:  General: no acute distress, well appearing, Breathing easy and unlabored.  MSK: Cervical Spine:  Gait:normal gait  full ROM  Neuro: Sensation is normal  Strength in BUE: 5/5   Vascular exam: Limbs are warm and well perfused    R shoulder:  Full ROM with pain  + Hawkin's    Imaging: X-ray of R shoulder: Mild right acromioclavicular arthrosis without acute osseous findings       Assessment:  Telley Boice is a 62 y.o. male who presents with R shoulder pain. Reviewed imaging with the patient. Discussed trying a R shoulder steroid injection and he is agreeable.     Plan:  R shoulder steroid injection  Start PT  Follow up in 2 weeks    R shoulder injection  Risks and benefits of procedure explained. Verbal consent given. Ethyl Chloride was used to anesthetize the skin. Performed injection in R subacromial bursa w/ posterior approach using sterile technique. Used 80 mg of Kenalog and 4 mL of Marcaine. Patient tolerated procedure without any difficulty. Moving shoulder well with decreased pain immediately after injection. Recommended patient use ice packs and take scheduled NSAIDs for next 24 hours to help with pain. Also recommended patient use shoulder as much as possible to prevent stiffness.     Answered all of the patient's questions  to his satisfaction and understanding and he is in agreement with the plan.     Toney Sang PA-C

## 2023-09-30 ENCOUNTER — Inpatient Hospital Stay: Payer: Medicare (Managed Care)

## 2023-10-23 MED ORDER — pregabalin (LYRICA) 150 MG capsule
150 | ORAL_CAPSULE | Freq: Two times a day (BID) | ORAL | 0 refills | Status: AC
Start: 2023-10-23 — End: 2024-01-21

## 2023-10-30 ENCOUNTER — Ambulatory Visit: Payer: Medicare (Managed Care)

## 2023-11-07 MED ORDER — tadalafiL (CIALIS) 10 MG tablet
10 | ORAL_TABLET | Freq: Every day | ORAL | 0 refills | Status: AC | PRN
Start: 2023-11-07 — End: 2023-12-25

## 2023-11-07 NOTE — Telephone Encounter (Signed)
 [name] was pulled, last filled 10/24/23 90 day supply.   Declining.

## 2023-11-07 NOTE — Telephone Encounter (Signed)
 08/03/2023 [name], MD      Visit date not found

## 2023-11-10 NOTE — Telephone Encounter (Signed)
 Was refilled on 10/23/23 for [facility]   Declining medication and sent pt a mychart to let him know.

## 2023-11-14 ENCOUNTER — Ambulatory Visit: Admit: 2023-11-14 | Payer: Medicare (Managed Care)

## 2023-11-14 ENCOUNTER — Ambulatory Visit: Payer: Medicare (Managed Care)

## 2023-11-14 DIAGNOSIS — M5431 Sciatica, right side: Secondary | ICD-10-CM

## 2023-11-14 MED ORDER — pregabalin (LYRICA) 225 MG capsule
225 | ORAL_CAPSULE | Freq: Two times a day (BID) | ORAL | 2 refills
Start: 2023-11-14 — End: 2023-12-09

## 2023-11-14 NOTE — Progress Notes (Signed)
 Pre-Visit Preventative Plannning  Lab Results   Component Value Date    HGBA1C 5.0 08/01/2023     Health Maintenance   Topic Date Due    Colorectal Cancer Screening (MyChart)  07/18/2022    Renal Function/GFR  07/31/2024    Annual Medicare Wellness Visit  07/31/2024    Comprehensive Physical Exam  07/31/2024    Depression Screening  11/13/2024    Diabetes Screening  07/31/2026    Immunization: DTaP/Tdap/Td (2 - Td or Tdap) 11/15/2027    Hepatitis C Screening (MyChart)  Completed    HIV Screening  Completed    Immunization: Influenza (MyChart)  Completed    Immunization: Zoster  Completed    Immunization: RSV (Adult)  Completed    Immunization: Pneumococcal  Aged Out     Immunization History   Administered Date(s) Administered    COVID-19, mRNA, Moderna monovalent, age 31+ 06/26/2023    COVID-19, mRNA, Pfizer bivalent booster, tris-sucrose, age 31+ 07/17/2021    COVID-19, mRNA, Pfizer monovalent 12/25/2019, 01/22/2020, 07/14/2020, 01/14/2021    COVID-19, mRNA, tris-sucrose, age 15+ (COMIRNATY) 08/04/2022    Influenza, quadrivalent, intradermal, preservative-free 06/26/2023    Influenza, quadrivalent, preservative-free 06/26/2018, 05/31/2019, 06/15/2020, 07/17/2021    Influenza, unspecified 06/28/2022    Pneumococcal polysaccharide, 23-valent 06/16/2020    RSV, recombinant, prefusion F, adjuvant (Arexvy) 06/28/2022    Zoster, recombinant 09/22/2020, 01/19/2021    tdap 11/14/2017     CC:  Chief Complaint   Patient presents with    Degenerative Disk Disease     States that pregabalin  is not helping- would like next steps. States he has not done the colonoscopy yet due to cost.      HPI: See A&P for details of HPI    Has known DDD and has chronic polyneuropathy     ROS: Please see HPI    Past Medical History:  Past Medical History:   Diagnosis Date    Elevated prostate specific antigen (PSA) 08/02/2023    Essential hypertension     Hepatitis C 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 08/16/2021    Needs confirmed    Hyperlipidemia     Low  serum HDL 08/16/2021    [name] (left ventricular hypertrophy) 11/28/2022    Nonrheumatic mitral valve regurgitation 11/28/2022    Normocytic anemia 11/09/2021    Prediabetes 08/16/2021    Spinal stenosis of cervical region       Current Outpatient Medications   Medication Sig    atorvastatin  take one tablet by mouth every day    azelastine  Use 1 spray into each nostril 2 times a day. Use in each nostril as directed    cholecalciferol (vitamin D3) Take by mouth.    docusate sodium  Take 1 capsule (100 mg total) by mouth 2 times a day as needed for Constipation.    ferrous sulfate  Take 1 tablet (325 mg total) by mouth daily with breakfast.    lidocaine  Place 1 patch onto the skin daily. Apply patch for 12 hours and then remove patch and leave off for 12 hours.    lisinopriL  TAKE ONE TABLET BY MOUTH EVERY DAY. NEW LOWER DOSE.    multivitamin Take 1 capsule by mouth daily.    psyllium Take 1 capsule (0.52 g total) by mouth daily.    tadalafiL  Take 1-2 tablets (10-20 mg total) by mouth daily as needed for Erectile Dysfunction.    tamsulosin  Take 1 capsule (0.4 mg total) by mouth daily.    bisacodyL  Prep for Colonoscopy Procedure: The day before  your procedure: take 4 tablets at 2pm. The day of your procedure take 2 tablets 4hrs prior to your arrival time. (Patient not taking: Reported on 11/14/2023)    polyethylene glycol Prep for Colonoscopy Procedure: Mix 15 doses/capfuls of miralax  (255 g total) with 64 fluid ounces. Begin drinking day before procedure. 4hrs before arrival time, day of procedure: mix 7 additional doses/capfuls miralax  (119 g total) with 32-64 fluid ounces and drink until complete. (Patient not taking: Reported on 11/14/2023)    polyethylene glycol To Prep for colonoscopy. Drink 8oz every 20-30 minutes until the entire bottle is consumed. (Patient not taking: Reported on 11/14/2023)    pregabalin  Take 1 capsule (225 mg total) by mouth 2 times a day.     No current facility-administered medications for this  visit.       PE:  BP 109/73 (BP Location: Left upper arm, Patient Position: Sitting, BP Cuff Size: Regular)   Pulse 88   Resp 16   Ht 5' 10 (1.778 m)   Wt 195 lb (88.5 kg)   SpO2 95%   BMI 27.98 kg/m   Wt Readings from Last 3 Encounters:   11/14/23 195 lb (88.5 kg)   09/20/23 205 lb (93 kg)   09/01/23 205 lb 12.8 oz (93.4 kg)     Today's body mass index is 27.98 kg/m.     General: Well appearing, well developed, well noursed. No acute distress.    Skin: No rashes or suspicious lesions  Cardiac: Regular rate and rhythm. No murmurs rubs or gallops.  Normal S1 and S2.    Respiratory: Clear to auscultation bilateral.  No respiratory distress.  No Tachypnea  Psych: Alert and oriented.  Normal mood and affect    A/P: Mitchell Evans is a 63 y.o. male with a IN PERSON visit for chronic sciatica not currently well controlled    Mitchell Evans was seen today for degenerative disk disease.    Diagnoses and all orders for this visit:    Bilateral sciatica (Primary)  Assessment & Plan:  Not interested in surgical intervention or opioids.  Increase Lyrica     Orders:  -     pregabalin  (LYRICA ) 225 MG capsule; Take 1 capsule (225 mg total) by mouth 2 times a day.    Atherosclerosis of aorta (CMS-HCC)  Assessment & Plan:  Continue current Lipitor         Mitchell Berke, MD  [facility]

## 2023-11-15 ENCOUNTER — Ambulatory Visit: Payer: Medicare (Managed Care) | Attending: Gastroenterology

## 2023-11-15 NOTE — Assessment & Plan Note (Signed)
 Not interested in surgical intervention or opioids.  Increase Lyrica

## 2023-11-15 NOTE — Assessment & Plan Note (Signed)
 Continue current Lipitor.

## 2023-11-15 NOTE — Telephone Encounter (Signed)
 Pt can not be rescheduled with Dr.Danielson due to no shows

## 2023-11-21 ENCOUNTER — Inpatient Hospital Stay
Admit: 2023-11-21 | Discharge: 2023-11-21 | Disposition: A | Discharge status: Home / Self Care | Payer: Medicare (Managed Care)

## 2023-11-21 ENCOUNTER — Emergency Department: Admit: 2023-11-21 | Payer: Medicare (Managed Care)

## 2023-11-21 DIAGNOSIS — M25561 Pain in right knee: Secondary | ICD-10-CM

## 2023-11-21 MED ORDER — ibuprofen (MOTRIN) tablet 800 mg
800 | Freq: Once | ORAL | Status: AC
Start: 2023-11-21 — End: 2023-11-21
  Administered 2023-11-21: 19:00:00 via ORAL

## 2023-11-21 MED FILL — IBUPROFEN 800 MG TABLET: 800 800 MG | ORAL | Qty: 1

## 2023-11-21 NOTE — ED Notes (Signed)
 Pt discharged to home in good condition. Discharge instructions provided to patient and explained. Patient verbalized understanding. Medications reviewed and all of patient's questions and concerns answered. Pt A&Ox4, no signs of acute distress noted. Skin warm and dry. Pt ambulated out of ED without assistance

## 2023-11-21 NOTE — Discharge Instructions (Addendum)
 Using the ace wrap as needed for comfort and swelling  Alternate ibuprofen and Tylenol for pain relief  Follow-up with orthopedics if you continue to have pain    Return back with any other concerns

## 2023-11-21 NOTE — ED Attestation (Signed)
 ED Attending Attestation Note    Date of service:  11/21/2023    This patient was seen by the APP   I have seen and examined the patient, agree with the workup, evaluation, management and diagnosis. The care plan has been discussed and I concur.     My assessment reveals a 63 y.o. male presented to the Emergency Department for evaluation of right knee pain the patient is having pain discomfort after tripping at work over a wire he and presents for evaluation assessment for right knee pain.  X-rays are obtained which demonstrate no acute fracture, dislocation or joint effusion.  The patient appears appropriate discharged home for outpatient follow-up orthopedic surgery with discharge directions for rest ice elevation, Ace wrap and NSAIDs

## 2023-11-21 NOTE — ED Provider Notes (Signed)
 [facility] Emergency Department Encounter     Date of evaluation: 11/21/2023    Chief Complaint     Reason for visit: Knee Pain    Nursing notes, Past Medical History, Past Surgical History, Social History, Allergies, and Family History were reviewed.    Patient History     HPI: Mitchell Evans is a 63 y.o. male who presents to the emergency department with knee pain.  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  States that something was taped over, however blunted and with the floor and he accidentally tripped over it.  States that he landed directly onto his right knee and has been having initially did not have any pain, however has developed pain and swelling to his right knee.  He does have known spinal stenosis and states that it is starting to bother him as well.  Has some slight tingling in the right lower extremity, however no weakness.  He ambulated into the ED.  No other areas of injury.      With the exception of the above, there are no aggravating or alleviating factors.    Medical History:   Past Medical History:   Diagnosis Date    Elevated prostate specific antigen (PSA) 08/02/2023    Essential hypertension     Hepatitis C 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 08/16/2021    Needs confirmed    Hyperlipidemia     Low serum HDL 08/16/2021    [name] (left ventricular hypertrophy) 11/28/2022    Nonrheumatic mitral valve regurgitation 11/28/2022    Normocytic anemia 11/09/2021    Prediabetes 08/16/2021    Spinal stenosis of cervical region        Surgical History:   Past Surgical History:   Procedure Laterality Date    CERVICAL FUSION      KNEE SURGERY      SPINE SURGERY  2003       Medications: No current facility-administered medications for this encounter.    Current Outpatient Medications:     atorvastatin  (LIPITOR ) 20 MG tablet, take one tablet by mouth every day, Disp: 90 tablet, Rfl: 1    azelastine  (ASTELIN ) 137 mcg (0.1 %) nasal spray, Use 1  spray into each nostril 2 times a day. Use in each nostril as directed, Disp: 30 mL, Rfl: 0    bisacodyL  5 mg Tab, Prep for Colonoscopy Procedure: The day before your procedure: take 4 tablets at 2pm. The day of your procedure take 2 tablets 4hrs prior to your arrival time. (Patient not taking: Reported on 11/14/2023), Disp: 6 tablet, Rfl: 0    cholecalciferol, vitamin D3, 125 mcg (5,000 unit) Tab, Take by mouth., Disp: , Rfl:     docusate sodium  (COLACE) 100 MG capsule, Take 1 capsule (100 mg total) by mouth 2 times a day as needed for Constipation., Disp: 60 capsule, Rfl: 0    ferrous sulfate  325 (65 FE) MG tablet, Take 1 tablet (325 mg total) by mouth daily with breakfast., Disp: 90 tablet, Rfl: 1    lidocaine  (LIDODERM ) 5 %, Place 1 patch onto the skin daily. Apply patch for 12 hours and then remove patch and leave off for 12 hours., Disp: 30 patch, Rfl: 0    lisinopriL  (PRINIVIL ) 5 MG tablet, TAKE ONE TABLET BY MOUTH EVERY DAY. NEW LOWER DOSE., Disp: 90 tablet, Rfl: 1    multivitamin capsule, Take 1 capsule by mouth daily., Disp: , Rfl:     polyethylene glycol (  GLYCOLAX ) 17 gram/dose powder, Prep for Colonoscopy Procedure: Mix 15 doses/capfuls of miralax  (255 g total) with 64 fluid ounces. Begin drinking day before procedure. 4hrs before arrival time, day of procedure: mix 7 additional doses/capfuls miralax  (119 g total) with 32-64 fluid ounces and drink until complete. (Patient not taking: Reported on 11/14/2023), Disp: 510 g, Rfl: 0    polyethylene glycol (GOLYTELY ) 236-22.74-6.74 -5.86 gram solution, To Prep for colonoscopy. Drink 8oz every 20-30 minutes until the entire bottle is consumed. (Patient not taking: Reported on 11/14/2023), Disp: 4000 mL, Rfl: 0    pregabalin  (LYRICA ) 225 MG capsule, Take 1 capsule (225 mg total) by mouth 2 times a day., Disp: 60 capsule, Rfl: 2    psyllium 0.52 gram capsule, Take 1 capsule (0.52 g total) by mouth daily., Disp: , Rfl:     tadalafiL  (CIALIS ) 10 MG tablet,  Take 1-2 tablets (10-20 mg total) by mouth daily as needed for Erectile Dysfunction., Disp: 30 tablet, Rfl: 0    tamsulosin  (FLOMAX ) 0.4 mg Cap, Take 1 capsule (0.4 mg total) by mouth daily., Disp: 90 capsule, Rfl: 1    Social History: [name]  reports that he has never smoked. He has been exposed to tobacco smoke. He has never used smokeless tobacco. He reports that he does not currently use alcohol. He reports that he does not currently use drugs after having used the following drugs: Marijuana and Medical Marijuana.    Allergies:   Allergies as of 11/21/2023    (No Known Drug Allergies or Adverse Reactions)        Review of Systems     ROS   As stated above, all other systems reviewed and are otherwise negative.     Patient History     Vitals:    11/21/23 1304 11/21/23 1315   BP: 174/77 134/68   BP Location:  Right upper arm   Patient Position:  Lying   BP Cuff Size:  Large   Pulse: 73 66   Resp: 18 16   Temp: 97.7 F (36.5 C)    SpO2: 97% 98%   Weight: 204 lb (92.5 kg)    Height: 5' 11 (1.803 m)        General: 63 y.o. male in no apparent distress, well developed, well nourished, non-toxic appearance. Resting comfortably in bed.      HEENT: Atraumatic, normocephalic. EOMs intact.      Chest/Pulm: Respiratory rate normal. Speaks in complete sentences, no respiratory distress.    Cardiovascular: Heart rate normal.     Musculoskeletal: Tenderness palpation throughout the right anterior knee.  No appreciable effusion on examination.  No gross ligamentous instability.  Able to perform knee extension without difficulty.  Reports some mild tingling to the right lower extremity, however sensation intact.      Vascular: 2+ distal pulses.      Neuro: A&O x 4.  Normal speech without dysarthria or aphasia. Moves all extremities spontaneously and symmetrically. Gait normal without ataxia.     Skin: Warm, dry. No obvious rashes, petechiae, or cyanosis.     Psych: Appropriate mood and affect, normal interaction.      Diagnostic Studies     Labs: Please see electronic medical record for any tests performed in the ED   Labs Reviewed - No data to display    IMAGING STUDIES / RADIOLOGY: Please see electronic medical record for any tests performed in the ED  X-ray Knee Right 1 or 2-views   Final Result  IMPRESSION:   No acute osseous abnormality.      Report Verified by: Noma Barnes, MD at 11/21/2023 1:44 PM EST          EKG:  none    ED Procedures     Procedures  None    ED Course     ED Medications Administered:  Medications   ibuprofen  (MOTRIN ) tablet 800 mg (800 mg Oral Given 11/21/23 1349)       CONSULTS: None     MDM     Vitals:    11/21/23 1304 11/21/23 1315   BP: 174/77 134/68   BP Location:  Right upper arm   Patient Position:  Lying   BP Cuff Size:  Large   Pulse: 73 66   Resp: 18 16   Temp: 97.7 F (36.5 C)    SpO2: 97% 98%   Weight: 204 lb (92.5 kg)    Height: 5' 11 (1.803 m)        Alphonsa Brickle is a 63 y.o. male who presents to the emergency department with knee pain.     The patient has remained hemodynamically stable throughout their stay in the emergency department. Patient is overall well appearing and in no acute distress.  No appreciable swelling to the right knee, no gross ligamentous instability.  Endorses some mild tingling, however sensation is overall intact.  Normal temperature and pulses.  Knee extension intact.  X-ray was obtained which reveals no acute abnormality.  Patient will be treated symptomatically, and Ace wrap given.  Referral to orthopedics was provided. Work note given. Return precautions back to ED were discussed, discharged in stable condition.    The patient was evaluated by myself and the ED Attending Physician, Dr. Edith. All management and disposition plans were discussed and agreed upon.    Medical Decision Making  Problems Addressed:  Acute pain of right knee: complicated acute illness or injury    Amount and/or Complexity of Data Reviewed  Radiology:  ordered.    Risk  Prescription drug management.         Clinical Impression     1. Acute pain of right knee        Disposition     Discharged from the ED. See AVS for prescriptions, followup, and discharge instructions.     DISCHARGE MEDICATIONS:   New Prescriptions    No medications on file       PATIENT REFERRED TO:   [facility]  [address]  [place] [address] [place]  [address]  [phone]          CON SOLOMONS, [place], PA-C  Department of Emergency Medicine     [place], [place]  11/21/23 1410

## 2023-11-21 NOTE — ED Triage Notes (Signed)
 Pt presents ambulatory to ED with c/o right knee pain and swelling after tripping and falling on something at work.

## 2023-11-23 MED ORDER — azelastine (ASTELIN) 137 mcg (0.1 %) nasal spray
137 | Freq: Two times a day (BID) | NASAL | 1 refills | Status: AC
Start: 2023-11-23 — End: 2024-01-15

## 2023-11-23 NOTE — Telephone Encounter (Signed)
 11/14/2023 [name], MD     Visit date not found

## 2023-12-04 ENCOUNTER — Ambulatory Visit: Admit: 2023-12-04 | Payer: Medicare (Managed Care)

## 2023-12-04 DIAGNOSIS — M5441 Lumbago with sciatica, right side: Secondary | ICD-10-CM

## 2023-12-04 MED ORDER — celecoxib (CELEBREX) 200 MG capsule
200 | ORAL_CAPSULE | Freq: Two times a day (BID) | ORAL | 0 refills
Start: 2023-12-04 — End: 2024-01-24

## 2023-12-04 MED ORDER — predniSONE (DELTASONE) 20 MG tablet
20 | ORAL_TABLET | Freq: Every day | ORAL | 0 refills | Status: AC
Start: 2023-12-04 — End: 2023-12-09

## 2023-12-04 NOTE — Progress Notes (Signed)
 Pre-Visit Preventative Plannning  Lab Results   Component Value Date    HGBA1C 5.0 08/01/2023     Health Maintenance   Topic Date Due    Colorectal Cancer Screening (MyChart)  07/18/2022    Renal Function/GFR  07/31/2024    Annual Medicare Wellness Visit  07/31/2024    Comprehensive Physical Exam  07/31/2024    Depression Screening  11/13/2024    Diabetes Screening  07/31/2026    Immunization: DTaP/Tdap/Td (2 - Td or Tdap) 11/15/2027    Hepatitis C Screening (MyChart)  Completed    HIV Screening  Completed    Immunization: Influenza (MyChart)  Completed    Immunization: Zoster  Completed    Immunization: RSV (Adult)  Completed    Immunization: Pneumococcal  Aged Out     Immunization History   Administered Date(s) Administered    COVID-19, mRNA, Moderna monovalent, age 42+ 06/26/2023    COVID-19, mRNA, Pfizer bivalent booster, tris-sucrose, age 42+ 07/17/2021    COVID-19, mRNA, Pfizer monovalent 12/25/2019, 01/22/2020, 07/14/2020, 01/14/2021    COVID-19, mRNA, tris-sucrose, age 61+ (COMIRNATY) 08/04/2022    Influenza, quadrivalent, intradermal, preservative-free 06/26/2023    Influenza, quadrivalent, preservative-free 06/26/2018, 05/31/2019, 06/15/2020, 07/17/2021    Influenza, unspecified 06/28/2022    Pneumococcal polysaccharide, 23-valent 06/16/2020    RSV, recombinant, prefusion F, adjuvant (Arexvy) 06/28/2022    Zoster, recombinant 09/22/2020, 01/19/2021    tdap 11/14/2017     CC:  Chief Complaint   Patient presents with    Fall     On Tuesday     Sciatica    Letter for School/Work     HPI: See A&P for details of HPI    [place] on Tues after tripping on low cord the ground that was not worked.  Pt states not pursuing a worker's comp case.       Go to the ER, determined no fracture.  Sciatica in both legs.  Has been flared in leg pain since fall.  Has known chronic back pain.      May need an epidural,     ROS: Please see HPI    Past Medical History:  Past Medical History:   Diagnosis Date    Elevated prostate  specific antigen (PSA) 08/02/2023    Essential hypertension     Hepatitis C 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 08/16/2021    Needs confirmed    Hyperlipidemia     Low serum HDL 08/16/2021    [name] (left ventricular hypertrophy) 11/28/2022    Nonrheumatic mitral valve regurgitation 11/28/2022    Normocytic anemia 11/09/2021    Prediabetes 08/16/2021    Spinal stenosis of cervical region       Current Outpatient Medications   Medication Sig    atorvastatin  take one tablet by mouth every day    azelastine  Use 1 spray into each nostril 2 times a day. Use in each nostril as directed    bisacodyL  Prep for Colonoscopy Procedure: The day before your procedure: take 4 tablets at 2pm. The day of your procedure take 2 tablets 4hrs prior to your arrival time.    cholecalciferol (vitamin D3) Take by mouth.    docusate sodium  Take 1 capsule (100 mg total) by mouth 2 times a day as needed for Constipation.    ferrous sulfate  Take 1 tablet (325 mg total) by mouth daily with breakfast.    lidocaine  Place 1 patch onto the skin daily. Apply patch for 12 hours and then remove patch and leave off for 12 hours.  lisinopriL  TAKE ONE TABLET BY MOUTH EVERY DAY. NEW LOWER DOSE.    multivitamin Take 1 capsule by mouth daily.    polyethylene glycol Prep for Colonoscopy Procedure: Mix 15 doses/capfuls of miralax  (255 g total) with 64 fluid ounces. Begin drinking day before procedure. 4hrs before arrival time, day of procedure: mix 7 additional doses/capfuls miralax  (119 g total) with 32-64 fluid ounces and drink until complete.    polyethylene glycol To Prep for colonoscopy. Drink 8oz every 20-30 minutes until the entire bottle is consumed.    pregabalin  Take 1 capsule (225 mg total) by mouth 2 times a day.    psyllium Take 1 capsule (0.52 g total) by mouth daily.    tadalafiL  Take 1-2 tablets (10-20 mg total) by mouth daily as needed for Erectile Dysfunction.    tamsulosin  Take 1 capsule (0.4 mg total) by mouth daily.    celecoxib  Take 1 capsule (200 mg  total) by mouth 2 times a day.    predniSONE  Take 2 tablets (40 mg total) by mouth daily for 5 days. For 5 days then stop     No current facility-administered medications for this visit.       PE:  BP 167/78   Pulse 95   Resp 12   SpO2 97%   Wt Readings from Last 3 Encounters:   11/21/23 204 lb (92.5 kg)   11/14/23 195 lb (88.5 kg)   09/20/23 205 lb (93 kg)     Today's body mass index is unknown because there is no height or weight on file.     General: Well appearing, well developed, well noursed. No acute distress.    Skin: No rashes or suspicious lesions  Cardiac: Regular rate and rhythm. No murmurs rubs or gallops.  Normal S1 and S2.    Respiratory: Clear to auscultation bilateral.  No respiratory distress.  No Tachypnea  Neuro: 4+/5 strength in bilateral knee flexion and extension. Sensation decreased grossly in bilateral outer thigh and calf to light touch   Musculoskeletal: No lumbar perispinal tenderness, no SI joint tenderness, mild tenderness along lumbar spinous processes   Psych: Alert and oriented.  Normal mood and affect    A/P: Mitchell Evans is a 63 y.o. male with a IN PERSON visit for acute on chronic back pain s/p fall     Mitchell Evans was seen today for fall, sciatica and letter for school/work.    Diagnoses and all orders for this visit:    Acute bilateral low back pain with bilateral sciatica (Primary)  -     [facility]  -     predniSONE  (DELTASONE ) 20 MG tablet; Take 2 tablets (40 mg total) by mouth daily for 5 days. For 5 days then stop  -     celecoxib  (CELEBREX ) 200 MG capsule; Take 1 capsule (200 mg total) by mouth 2 times a day.    Mitchell Berke, MD  [place] [facility]

## 2023-12-11 MED ORDER — pregabalin (LYRICA) 225 MG capsule
225 | ORAL_CAPSULE | Freq: Two times a day (BID) | ORAL | 0 refills | Status: AC
Start: 2023-12-11 — End: 2024-01-13

## 2023-12-11 NOTE — Telephone Encounter (Signed)
 12/04/2023 [name], MD  Visit date not found      [name] ran and documented    Last fill was 11/16/23    Printed and placed on Dr [facility] [employer] [facility] desk for review

## 2023-12-14 MED ORDER — atorvastatin (LIPITOR) 20 MG tablet
20 | ORAL_TABLET | Freq: Every day | ORAL | 0 refills | 30.00000 days | Status: AC
Start: 2023-12-14 — End: 2024-03-04

## 2023-12-14 NOTE — Telephone Encounter (Signed)
 12/04/2023 Mitchell Juneau, MD      Visit date not found

## 2023-12-15 ENCOUNTER — Ambulatory Visit: Payer: Medicare (Managed Care) | Attending: Adult Health

## 2023-12-18 ENCOUNTER — Ambulatory Visit: Payer: Medicare (Managed Care) | Attending: Medical

## 2023-12-18 DIAGNOSIS — M545 Low back pain, unspecified: Secondary | ICD-10-CM

## 2023-12-18 NOTE — Telephone Encounter (Addendum)
 [name] pulled last refill 225 mg on 11/16/2023 for 30 days     12/04/2023 [name], MD     Visit date not found

## 2023-12-20 MED ORDER — pregabalin (LYRICA) 225 MG capsule
225 | ORAL_CAPSULE | Freq: Two times a day (BID) | ORAL | 0 refills | Status: AC
Start: 2023-12-20 — End: 2024-03-19

## 2023-12-25 ENCOUNTER — Ambulatory Visit: Payer: Medicare (Managed Care) | Attending: Medical

## 2023-12-25 DIAGNOSIS — M545 Low back pain, unspecified: Secondary | ICD-10-CM

## 2023-12-25 MED ORDER — tadalafiL (CIALIS) 10 MG tablet
10 | ORAL_TABLET | Freq: Every day | ORAL | 5 refills | PRN
Start: 2023-12-25 — End: 2024-01-25

## 2023-12-25 NOTE — Telephone Encounter (Signed)
 12/04/2023 [name], MD      Visit date not found

## 2024-01-05 ENCOUNTER — Ambulatory Visit: Payer: Medicare (Managed Care) | Attending: Medical

## 2024-01-16 MED ORDER — azelastine (ASTELIN) 137 mcg (0.1 %) nasal spray
137 | Freq: Two times a day (BID) | NASAL | 1 refills | 30.00000 days
Start: 2024-01-16 — End: 2024-03-14

## 2024-01-16 NOTE — Telephone Encounter (Signed)
 12/04/2023 [name], MD      Visit date not found

## 2024-01-19 ENCOUNTER — Ambulatory Visit: Payer: Medicare (Managed Care) | Attending: Medical

## 2024-01-24 ENCOUNTER — Ambulatory Visit: Admit: 2024-01-24 | Discharge: 2024-01-24 | Payer: Medicare (Managed Care) | Attending: Sports Medicine

## 2024-01-24 DIAGNOSIS — M25512 Pain in left shoulder: Secondary | ICD-10-CM

## 2024-01-24 MED ORDER — lidocaine 10 mg/mL (1 %) injection 2 mL
10 | Freq: Once | INTRAMUSCULAR | Status: AC
Start: 2024-01-24 — End: 2024-01-24
  Administered 2024-01-24: 20:00:00 via SUBCUTANEOUS

## 2024-01-24 MED ORDER — BUPivacaine HCl (MARCAINE) 0.5 % (5 mg/mL) injection 10 mg
0.5 | Freq: Once | INTRAMUSCULAR | Status: AC
Start: 2024-01-24 — End: 2024-01-24
  Administered 2024-01-24: 20:00:00 via SUBCUTANEOUS

## 2024-01-24 MED ORDER — triamcinolone acetonide (KENALOG-40) injection 40 mg
40 | Freq: Once | INTRAMUSCULAR | Status: AC
Start: 2024-01-24 — End: 2024-01-24
  Administered 2024-01-24: 20:00:00

## 2024-01-24 NOTE — Progress Notes (Signed)
 This injection is documented for Kipp Laurence, MD.    Per provider's instructions, the patient was prepped for a left shoulder injection by Kipp Laurence, MD  - as well as drug and  allergies were reviewed.   The appropriate medication was drawn, and documented.  A medical assistant was also present to assist with the injection. The injection was administered by Kipp Laurence, MD. The patient was then educated on what to expect following the injection.   All questions were answered.    A verbal consent was obtained and a verbal timeout was performed on 01/24/2024 at 3:43 PM.     Harrel Carina, MA

## 2024-01-24 NOTE — Progress Notes (Signed)
 [facility] Note      HPI:   Mitchell Evans is a 63 y.o. male who presents for re-evaluation of his left shoulder and his right knee. He previously was seen for left shoulder rotator cuff tendinitis in 04/19/2023 and got a left shoulder subacromial injection that he stated helped him for several months. More recently, he has had return of his pain that feels similar to previously. He also has some radiating pain down his left arm; he states he had an ACDF C6-7 over 20 years ago. He still does his exercises for the left shoulder while at [employer].     Of note, he also has some right leg pain that occurred after a recent fall at work. He states it is radiating down his right leg, which he has had before with sciatica. He previously has gotten lumbar spine injections and has so he would like to get plugged back in with them.     Exam:   On exam, he has good range of motion of the left shoulder- 110 degrees of forward flexion, 60 degrees of ER, IR to lumbar spine. He has good strength with resisted external rotation, belly press, lift off test. He has tenderness to palpation anteriorly along the left shoulder. With regards to the right leg, he has good strength with hip flexion, knee flexion/extension. Mild joint line tenderness on the medial and lateral sides.     Imaging:  No new imaging to review. Right knee xrays from the emergency department on 11/21/2023 show mild degenerative changes in the right knee with good joint space in all three compartments.     Assessment:  Mitchell Evans is a 63 y.o. male with the following orthopaedic concerns:   Left shoulder rotator cuff tendinopathy  Right lower extremity radicular symptoms    Plan:   We discussed the situation with patient and he did well with a previous left shoulder subacromial injection.  He has only received one previously so we will trial another one today and he can continue his exercises.  We also recommend that he gets plugged back in with  someone for his spine given his previous ACDF as well as the radicular symptoms in his right lower extremity.  He is interested in doing that.  Under sterile conditions, the left shoulder was injected in subacromial space with a combination of lidocaine, Marcaine and Kenalog.  The procedure was well-tolerated by the patient.  He will give [place] an update on MyChart on how things are going.    The patient was seen and assessed by both myself and Dr. Kem Kays.  Dr. Kem Kays was present for all procedures and clinical decision making.    Mickel Baas, MD  Orthopaedic Surgery Resident

## 2024-01-25 MED ORDER — celecoxib (CELEBREX) 200 MG capsule
200 | ORAL_CAPSULE | Freq: Two times a day (BID) | ORAL | 0 refills | Status: AC
Start: 2024-01-25 — End: ?

## 2024-01-25 NOTE — Telephone Encounter (Signed)
 12/04/2023 [name], MD      Visit date not found

## 2024-01-26 NOTE — Telephone Encounter (Signed)
 12/04/2023 [name], MD      Visit date not found

## 2024-01-29 MED ORDER — tadalafiL (CIALIS) 10 MG tablet
10 | ORAL_TABLET | Freq: Every day | ORAL | 5 refills | 30.00000 days | Status: AC | PRN
Start: 2024-01-29 — End: 2024-02-28

## 2024-01-31 NOTE — Progress Notes (Signed)
 [facility]IC[facility]    PATIENT NAME:      Mitchell Evans, Mitchell Evans                MRN:                 [id number]  DATE OF BIRTH:     12-16-1960                    CSN:                 [id number]  PROVIDER:          Catharine Clock, MD               VISIT DATE:          01/24/2024                                   OFFICE NOTE      I was present with the resident during the history exam.  I discussed the case with the resident and agree with findings noted.  I personally performed the injection and agree with the documentation.        Catharine Clock, MD      BG/AQ  DD: 01/31/2024 13:04:42  DT: 01/31/2024 21:30:42    JOB#:  [id number]

## 2024-02-06 NOTE — Telephone Encounter (Signed)
 Attempted to contact patient. No answer. LVM requesting return call.     Appointment changed to [facility] Sieg[facility] at 1100 am as patient has not had initial evaluation and does not have imaging. PA Siegwald will see patient to begin workup and order imaging.

## 2024-02-08 ENCOUNTER — Ambulatory Visit: Admit: 2024-02-08 | Discharge: 2024-02-13 | Payer: Medicare (Managed Care)

## 2024-02-08 ENCOUNTER — Inpatient Hospital Stay: Admit: 2024-02-08 | Discharge: 2024-02-13 | Payer: Medicare (Managed Care) | Attending: Family

## 2024-02-08 DIAGNOSIS — Z981 Arthrodesis status: Secondary | ICD-10-CM

## 2024-02-08 DIAGNOSIS — M545 Low back pain, unspecified: Secondary | ICD-10-CM

## 2024-02-08 NOTE — Progress Notes (Signed)
 OFFICE VISIT  02/08/2024  PATIENT: Mitchell Evans   MRN: [id number]   DOB: May 09, 1961     Chief Complaint: Neck and low back pain  Chief Complaint   Patient presents with    New Patient Visit/ Consultation         History of Present Illness: This is a 63 y.o. patient with a history of elevated PSA, HTN, [name], [name], and anxiety and past surgical history RT knee arthroscopy, ACDF (2003), and lumbar surgery who presents to clinic today for low back pain. This pain is chronic but was exacerbated s/p after he tripped but caught himself before falling in 11/2023. The severity of his pain is a 7/10 on average and worsens with activity. He has noticed worsening LUE pain and numbness. He also complains of BLE pain (R>L) and describes it as burning and tingling. He denies bowel or bladder changes or saddle anesthesia. He denies any falls but admits intermittent weakness of the LUE and BLE. He has never had a CESI but reports having had multiple LESIs with minimal relief and some even aggravating his symptoms. Patient notes it has been years since completing any formal PT for his spine. More recently, he has been managing his symptoms with Lyrica  225mg  POQ12HRS and Celebrex  200mg  POQ12HRS with moderate relief. He reports having followed-up with pain management in the past for opioids but did not tolerate these well.     He denies having had any recent imaging on his cervical spine.       Past Medical History:   Diagnosis Date    Elevated prostate specific antigen (PSA) 08/02/2023    Essential hypertension     Hepatitis C 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 08/16/2021    Needs confirmed    High blood pressure     History of suicidal ideation     Hyperlipidemia     Low serum HDL 08/16/2021    [name] (left ventricular hypertrophy) 11/28/2022    Nonrheumatic mitral valve regurgitation 11/28/2022    Normocytic anemia 11/09/2021    Prediabetes 08/16/2021    Spinal stenosis of cervical region       Past Surgical History:   Procedure Laterality Date     CERVICAL FUSION      KNEE SURGERY      SPINE SURGERY  2003    WISDOM TOOTH EXTRACTION        Allergies[1]   Social History     Tobacco Use    Smoking status: Never     Passive exposure: Past    Smokeless tobacco: Never   Substance Use Topics    Alcohol use: Not Currently      Family History   Problem Relation Age of Onset    Colon Cancer Mother     Cancer Mother         Colon cancer    Alzheimer's disease Father     Diabetes Sister     Diabetes Sister        Home Medications    Medication Sig Taking? Last Dose   atorvastatin  (LIPITOR) 20 MG tablet TAKE ONE TABLET BY MOUTH EVERY DAY Yes    azelastine  (ASTELIN ) 137 mcg (0.1 %) nasal spray Use 1 spray into each nostril 2 times a day. Use in each nostril as directed Yes    bisacodyL  5 mg Tab Prep for Colonoscopy Procedure: The day before your procedure: take 4 tablets at 2pm. The day of your procedure take 2 tablets 4hrs prior to your arrival time. Yes  celecoxib  (CELEBREX ) 200 MG capsule Take 1 capsule (200 mg total) by mouth 2 times a day. Yes    cholecalciferol, vitamin D3, 125 mcg (5,000 unit) Tab Take by mouth. Yes    docusate sodium  (COLACE) 100 MG capsule Take 1 capsule (100 mg total) by mouth 2 times a day as needed for Constipation. Yes    ferrous sulfate  325 (65 FE) MG tablet Take 1 tablet (325 mg total) by mouth daily with breakfast. Yes    lidocaine  (LIDODERM ) 5 % Place 1 patch onto the skin daily. Apply patch for 12 hours and then remove patch and leave off for 12 hours. Yes    lisinopriL  (PRINIVIL ) 5 MG tablet TAKE ONE TABLET BY MOUTH EVERY DAY. NEW LOWER DOSE. Yes    multivitamin capsule Take 1 capsule by mouth daily. Yes    polyethylene glycol (GLYCOLAX ) 17 gram/dose powder Prep for Colonoscopy Procedure: Mix 15 doses/capfuls of miralax  (255 g total) with 64 fluid ounces. Begin drinking day before procedure. 4hrs before arrival time, day of procedure: mix 7 additional doses/capfuls miralax  (119 g total) with 32-64 fluid ounces and drink until complete.  Yes    polyethylene glycol (GOLYTELY ) 236-22.74-6.74 -5.86 gram solution To Prep for colonoscopy. Drink 8oz every 20-30 minutes until the entire bottle is consumed. Yes    pregabalin  (LYRICA ) 225 MG capsule Take 1 capsule (225 mg total) by mouth 2 times a day. Yes    psyllium 0.52 gram capsule Take 1 capsule (0.52 g total) by mouth daily. Yes    tadalafiL  (CIALIS ) 10 MG tablet Take 1-2 tablets (10-20 mg total) by mouth daily as needed for Erectile Dysfunction. Yes    tamsulosin  (FLOMAX ) 0.4 mg Cap Take 1 capsule (0.4 mg total) by mouth daily. Yes        REVIEW OF SYSTEMS  The patient's presenting symptoms and ROS are as noted in the history and physical and also documented in the Epic chart. All other systems are reviewed and found non-contributory.      PHYSICAL EXAMINATION    BP 143/82 (BP Location: Left upper arm, Patient Position: Sitting, BP Cuff Size: Large)   Pulse 80   Temp 98.4 F (36.9 C) (Temporal)   Resp 16   Ht 5' 11 (1.803 m)   Wt 203 lb 9.6 oz (92.4 kg)   SpO2 95%   BMI 28.40 kg/m Today's body mass index is 28.4 kg/m. BMI is above normal. Discussion was deferred due to other visit priorities.       General: Appearance in no apparent distress, well developed and well nourished, alert, and oriented times 3  Affect: appropriate    Neurological Exam  Mitchell Evans is oriented to person, place, time, and situation and provides  Good  history.    Memory:Intact to recent and distant events  Attention:Intact  Fund of Knowledge: Good  Speech: Fluent    Cranial Nerves:   Eyes:EOMI   Pupils:PERRL   Facial: Symmetric   Hearing:Normal to spoken voice   Palate:Normal elevation   Shoulder shrug:5/5   Tongue protrusion:Midline  Gait: Normal, wide-based gait, Able to walk on heels/toes/tandem  Assist devices: None  Coordination: Normal   Musculoskeletal: TTP over the cervical and lumbar paraspinals BL     Right   Left   Deltoid 5 4+   Biceps 5 4+   Triceps 5 4+   Grip 5 4   Intrinsics 5 4   Hip Flex 4+ 4+   Knee  Flex 4+ 4+  Knee Ext 4+ 4+   Dorsal Flex 4+ 4+   Plantar Flex 4+ 4+     Deep Tendon Reflex   Right Left   Biceps   2+ 2+   Patellar   2+ 2+     Sensory: Normal to light touch  Hoffman: Negative  Spurling: Positive  SLR: Negative      IMAGING:  No recent imaging to review.        Assessment and Plan:  Mitchell Evans is a 63 y.o. male with a history of elevated PSA, HTN, [name], [name], and anxiety and past surgical history RT knee arthroscopy, ACDF (2003), and lumbar surgery who presents to clinic today for low back pain. This pain is chronic but was exacerbated s/p after he tripped but caught himself before falling in 11/2023. The severity of his pain is a 7/10 on average and worsens with activity. He has noticed worsening LUE pain and numbness. He also complains of BLE pain (R>L) and describes it as burning and tingling. He denies bowel or bladder changes or saddle anesthesia. He denies any falls but admits intermittent weakness of the LUE and BLE. He has never had a CESI but reports having had multiple LESIs with minimal relief and some even aggravating his symptoms. Patient notes it has been years since completing any formal PT for his spine. More recently, he has been managing his symptoms with Lyrica  225mg  POQ12HRS and Celebrex  200mg  POQ12HRS with moderate relief. He reports having followed-up with pain management in the past for opioids but did not tolerate these well.     He denies having had any recent imaging on his cervical spine or lumbar spine.    -I will order flexion/extension x-rays of the cervical and lumbar spine to evaluate for any dynamic instability.     -We discussed trying a course of formal physical therapy.  Physical therapy can help strengthen and stretch the muscles around the joints.     -Will order a CMRI w/o contrast to evaluate for worsening cervical symptoms and evaluate for etiology.      Mitchell Evans is in agreement with plan.         I have spent 45 minutes preparing to see the  patient, examining the patient, educating the patient, and documenting in the EHR.    Lenn Quint, [place]  Neurosurgery  [phone]  02/08/2024  11:49 AM       [1] No Known Drug Allergies or Adverse Reactions

## 2024-02-08 NOTE — Patient Instructions (Addendum)
 An After Visit Summary was printed and given to the patient.    Xrays have been ordered. Please complete them today.    MRI has been ordered. Please call [phone] to schedule.    Physical therapy has been ordered. Please call [phone] to schedule.     Work note has been printed.    Follow up with Lenn Quint after completing MRI. To schedule an appointment, please call [phone].

## 2024-02-08 NOTE — Progress Notes (Unsigned)
 Chart Prep    Am I scheduled with the correct MD/APP  yes  Am I scheduled with the correct visit type yes  All Imaging in pacs  yes  All testing and recommendations completed  n/a  Do I have a shunt no  Do I have an appointment scheduled 24 hours after MRI n/a  Do I have images placed prior to my appointment no  All imaging pulled from [facility] into pacs  n/a  All imaging requested from outside facilities sent to imagingrequests to merge  n/a  If pt is scheduled in [facility] and needs xrays prior did I call pt to have them arrive at least 30 minutes early and notify where xray is located?  N/a  Checked decision tree for images  yes  Checked and updated care everywhere for images, notes, etc  yes  Additional comments

## 2024-02-12 NOTE — Telephone Encounter (Signed)
 12/04/2023 [name], MD  02/15/2024 [name], MD

## 2024-02-14 MED ORDER — tamsulosin (FLOMAX) 0.4 mg Cap
0.4 | ORAL_CAPSULE | Freq: Every day | ORAL | 1 refills | 30.00000 days | Status: AC
Start: 2024-02-14 — End: ?

## 2024-02-15 ENCOUNTER — Ambulatory Visit: Payer: Medicare (Managed Care)

## 2024-02-19 ENCOUNTER — Ambulatory Visit: Admit: 2024-02-19 | Payer: Medicare (Managed Care)

## 2024-02-19 ENCOUNTER — Ambulatory Visit: Payer: Medicare (Managed Care)

## 2024-02-19 DIAGNOSIS — M5431 Sciatica, right side: Secondary | ICD-10-CM

## 2024-02-19 MED ORDER — pregabalin (LYRICA) 225 MG capsule
225 | ORAL_CAPSULE | Freq: Two times a day (BID) | ORAL | 0 refills | 30.00000 days
Start: 2024-02-19 — End: 2024-03-26

## 2024-02-19 NOTE — Assessment & Plan Note (Signed)
 Using black pepper and tumeric     Insurance wanting to do PT prior to MRI.  MRI needed to determine if needs epidural.

## 2024-02-19 NOTE — Progress Notes (Signed)
 Pre-Visit Preventative Plannning  Lab Results   Component Value Date    HGBA1C 5.0 08/01/2023     Health Maintenance   Topic Date Due    ASCVD Assessment  Never done    Immunization: Pneumococcal (2 of 2 - PCV) 06/16/2021    Colorectal Cancer Screening (MyChart)  02/01/2024    Renal Function/GFR  07/31/2024    Annual Medicare Wellness Visit  07/31/2024    Comprehensive Physical Exam  07/31/2024    Depression Screening  02/05/2025    Diabetes Screening  07/31/2026    Immunization: DTaP/Tdap/Td (2 - Td or Tdap) 11/15/2027    Hepatitis C Screening (MyChart)  Completed    HIV Screening  Completed    Immunization: Influenza (MyChart)  Completed    Immunization: Zoster  Completed    Immunization: RSV (Adult)  Completed     Immunization History   Administered Date(s) Administered    COVID-19, mRNA, Moderna monovalent, age 85+ 06/26/2023    COVID-19, mRNA, Pfizer bivalent booster, tris-sucrose, age 85+ 07/17/2021    COVID-19, mRNA, Pfizer monovalent 12/25/2019, 01/22/2020, 07/14/2020, 01/14/2021    COVID-19, mRNA, tris-sucrose, age 32+ (COMIRNATY) 08/04/2022    Influenza, quadrivalent, intradermal, preservative-free 06/26/2023    Influenza, quadrivalent, preservative-free 06/26/2018, 05/31/2019, 06/15/2020, 07/17/2021    Influenza, unspecified 06/28/2022    Pneumococcal polysaccharide, 23-valent 06/16/2020    RSV, recombinant, prefusion F, adjuvant (Arexvy) 06/28/2022    Zoster, recombinant 09/22/2020, 01/19/2021    tdap 11/14/2017     CC:  Chief Complaint   Patient presents with    Sciatica     His specialist told him that he should ask you about increasing lyrica       HPI: See A&P for details of HPI    ROS: Please see HPI    Past Medical History:  Past Medical History:   Diagnosis Date    Cervical radiculopathy 02/19/2024    Elevated prostate specific antigen (PSA) 08/02/2023    Essential hypertension     Hepatitis C 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 08/16/2021    Needs confirmed    High blood pressure     History of suicidal  ideation     Hyperlipidemia     Low serum HDL 08/16/2021    [name] (left ventricular hypertrophy) 11/28/2022    Nonrheumatic mitral valve regurgitation 11/28/2022    Normocytic anemia 11/09/2021    Prediabetes 08/16/2021    Spinal stenosis of cervical region       Current Outpatient Medications   Medication Sig    atorvastatin  TAKE ONE TABLET BY MOUTH EVERY DAY    azelastine  Use 1 spray into each nostril 2 times a day. Use in each nostril as directed    bisacodyL  Prep for Colonoscopy Procedure: The day before your procedure: take 4 tablets at 2pm. The day of your procedure take 2 tablets 4hrs prior to your arrival time.    celecoxib  Take 1 capsule (200 mg total) by mouth 2 times a day.    cholecalciferol (vitamin D3) Take by mouth.    docusate sodium  Take 1 capsule (100 mg total) by mouth 2 times a day as needed for Constipation.    ferrous sulfate  Take 1 tablet (325 mg total) by mouth daily with breakfast.    lidocaine  Place 1 patch onto the skin daily. Apply patch for 12 hours and then remove patch and leave off for 12 hours.    lisinopriL  TAKE ONE TABLET BY MOUTH EVERY DAY. NEW LOWER DOSE.    multivitamin Take 1 capsule  by mouth daily.    polyethylene glycol Prep for Colonoscopy Procedure: Mix 15 doses/capfuls of miralax  (255 g total) with 64 fluid ounces. Begin drinking day before procedure. 4hrs before arrival time, day of procedure: mix 7 additional doses/capfuls miralax  (119 g total) with 32-64 fluid ounces and drink until complete.    polyethylene glycol To Prep for colonoscopy. Drink 8oz every 20-30 minutes until the entire bottle is consumed.    psyllium Take 1 capsule (0.52 g total) by mouth daily.    tadalafiL  Take 1-2 tablets (10-20 mg total) by mouth daily as needed for Erectile Dysfunction.    tamsulosin  Take 1 capsule (0.4 mg total) by mouth daily.    [START ON 03/23/2024] pregabalin  Take 1 capsule (225 mg total) by mouth 2 times a day.     No current facility-administered medications for this visit.        PE:  BP 148/80 (BP Location: Left upper arm, Patient Position: Sitting, BP Cuff Size: Large)   Pulse 73   Resp 12   Wt 208 lb (94.3 kg)   SpO2 95%   BMI 29.01 kg/m   Wt Readings from Last 3 Encounters:   02/19/24 208 lb (94.3 kg)   02/08/24 203 lb 9.6 oz (92.4 kg)   01/24/24 204 lb (92.5 kg)     Today's body mass index is 29.01 kg/m.     General: Well appearing, well developed, well noursed. No acute distress.    Skin: No rashes or suspicious lesions  Cardiac: Regular rate and rhythm. No murmurs rubs or gallops.  Normal S1 and S2.    Respiratory: Clear to auscultation bilateral.  No respiratory distress.  No Tachypnea  Vascular: No edema  Psych: Alert and oriented.  Normal mood and affect    A/P: Mitchell Evans is a 63 y.o. male with a IN PERSON visit for bilateral sciatica     Mitchell Evans was seen today for sciatica.    Diagnoses and all orders for this visit:    Bilateral sciatica (Primary)  Assessment & Plan:  Discussed increasing Lyrica  to max dosage.  Advised unlikely to be more effective so go back to 225 BID if not helpful - after discussion elects to continue current dosage    Orders:  -     pregabalin  (LYRICA ) 225 MG capsule; Take 1 capsule (225 mg total) by mouth 2 times a day.    Cervical radiculopathy  Assessment & Plan:  Using black pepper and tumeric     Insurance wanting to do PT prior to MRI.  MRI needed to determine if needs epidural.          No follow-ups on file.    Mitchell Meiers, MD  [facility]

## 2024-02-19 NOTE — Assessment & Plan Note (Addendum)
 Discussed increasing Lyrica  to max dosage.  Advised unlikely to be more effective so go back to 225 BID if not helpful - after discussion elects to continue current dosage

## 2024-02-29 MED ORDER — tadalafiL (CIALIS) 10 MG tablet
10 | ORAL_TABLET | Freq: Every day | ORAL | 5 refills | 30.00000 days | Status: AC | PRN
Start: 2024-02-29 — End: 2024-03-31

## 2024-02-29 NOTE — Telephone Encounter (Signed)
 02/19/2024 [name], MD  Visit date not found

## 2024-03-04 MED ORDER — lisinopriL (PRINIVIL) 5 MG tablet
5 | ORAL_TABLET | ORAL | 1 refills | 30.00000 days | Status: AC
Start: 2024-03-04 — End: ?

## 2024-03-04 MED ORDER — atorvastatin (LIPITOR) 20 MG tablet
20 | ORAL_TABLET | Freq: Every day | ORAL | 1 refills | 30.00000 days | Status: AC
Start: 2024-03-04 — End: ?

## 2024-03-04 NOTE — Telephone Encounter (Signed)
 02/19/2024 [name], MD  Visit date not found

## 2024-03-04 NOTE — Telephone Encounter (Signed)
 02/19/2024 Mitchell Meiers, MD  Visit date not found

## 2024-03-14 MED ORDER — azelastine (ASTELIN) 137 mcg (0.1 %) nasal spray
137 | Freq: Two times a day (BID) | NASAL | 1 refills | 30.00000 days
Start: 2024-03-14 — End: 2024-05-05

## 2024-03-14 NOTE — Telephone Encounter (Signed)
 02/19/2024 [name], MD  Visit date not found

## 2024-03-20 ENCOUNTER — Ambulatory Visit: Payer: Medicare (Managed Care)

## 2024-03-27 MED ORDER — pregabalin (LYRICA) 225 MG capsule
225 | ORAL_CAPSULE | Freq: Two times a day (BID) | ORAL | 0 refills | 30.00000 days
Start: 2024-03-27 — End: 2024-04-22

## 2024-03-27 NOTE — Telephone Encounter (Signed)
 02/19/2024 Mitchell Meiers, MD      04/10/2024 Mitchell Meiers, MD

## 2024-04-01 MED ORDER — tadalafiL (CIALIS) 10 MG tablet
10 | ORAL_TABLET | Freq: Every day | ORAL | 0 refills | 30.00000 days | PRN
Start: 2024-04-01 — End: 2024-04-27

## 2024-04-01 NOTE — Telephone Encounter (Signed)
 02/19/2024 [name], MD  Visit date not found

## 2024-04-10 ENCOUNTER — Ambulatory Visit: Payer: Medicare (Managed Care)

## 2024-04-22 ENCOUNTER — Ambulatory Visit: Admit: 2024-04-22 | Discharge: 2024-04-27 | Payer: Medicare (Managed Care)

## 2024-04-22 DIAGNOSIS — M792 Neuralgia and neuritis, unspecified: Principal | ICD-10-CM

## 2024-04-22 MED ORDER — predniSONE (DELTASONE) 20 MG tablet
20 | ORAL_TABLET | Freq: Every day | ORAL | 0 refills | 30.00000 days | Status: AC
Start: 2024-04-22 — End: 2024-04-27

## 2024-04-22 MED ORDER — pregabalin (LYRICA) 300 MG capsule
300 | ORAL_CAPSULE | Freq: Two times a day (BID) | ORAL | 0 refills | 30.00000 days | Status: AC
Start: 2024-04-22 — End: 2024-07-21

## 2024-04-22 MED ORDER — DULoxetine (CYMBALTA) 30 MG capsule
30 | ORAL_CAPSULE | ORAL | 0 refills | 30.00000 days | Status: AC
Start: 2024-04-22 — End: 2024-07-21

## 2024-04-22 NOTE — Progress Notes (Signed)
 Pre-Visit Preventative Plannning  Lab Results   Component Value Date    HGBA1C 5.0 08/01/2023     Health Maintenance   Topic Date Due    ASCVD Assessment  Never done    Immunization: Pneumococcal (2 of 2 - PCV) 06/16/2021    Colorectal Cancer Screening (MyChart)  02/01/2024    Immunization: Influenza (MyChart) (1) 06/17/2024    Renal Function/GFR  07/31/2024    Annual Medicare Wellness Visit  07/31/2024    Comprehensive Physical Exam  07/31/2024    Depression Monitoring (PHQ-9)  11/13/2024    Diabetes Screening  07/31/2026    Immunization: DTaP/Tdap/Td (2 - Td or Tdap) 11/15/2027    Hepatitis C Screening (MyChart)  Completed    HIV Screening  Completed    Immunization: Zoster  Completed    Immunization: RSV (Adult)  Completed     Immunization History   Administered Date(s) Administered    COVID-19, mRNA, Moderna monovalent, age 10+ 06/26/2023    COVID-19, mRNA, Pfizer bivalent booster, tris-sucrose, age 10+ 07/17/2021    COVID-19, mRNA, Pfizer monovalent 12/25/2019, 01/22/2020, 07/14/2020, 01/14/2021    COVID-19, mRNA, tris-sucrose, age 57+ (COMIRNATY) 08/04/2022    Influenza, quadrivalent, intradermal, preservative-free 06/26/2023    Influenza, quadrivalent, preservative-free 06/26/2018, 05/31/2019, 06/15/2020, 07/17/2021    Influenza, unspecified 06/28/2022    Pneumococcal polysaccharide, 23-valent 06/16/2020    RSV, recombinant, prefusion F, adjuvant (Arexvy) 06/28/2022    Zoster, recombinant 09/22/2020, 01/19/2021    tdap 11/14/2017     CC:   Chief Complaint   Patient presents with    Sciatica     Nerve pain     HPI: See A&P for details of HPI    Ortho recommended to continue going to the gym instead of PT    Surgeron recommending surgery and patient declined.      ROS: Please see HPI    Home Meds:  Current Outpatient Medications   Medication Sig    atorvastatin  TAKE ONE TABLET BY MOUTH EVERY DAY    azelastine  Use 1 spray into each nostril 2 times a day. Use in each nostril as directed    bisacodyL  Prep for  Colonoscopy Procedure: The day before your procedure: take 4 tablets at 2pm. The day of your procedure take 2 tablets 4hrs prior to your arrival time.    celecoxib  Take 1 capsule (200 mg total) by mouth 2 times a day.    cholecalciferol (vitamin D3) Take by mouth.    docusate sodium  Take 1 capsule (100 mg total) by mouth 2 times a day as needed for Constipation.    ferrous sulfate  Take 1 tablet (325 mg total) by mouth daily with breakfast.    lidocaine  Place 1 patch onto the skin daily. Apply patch for 12 hours and then remove patch and leave off for 12 hours.    lisinopriL  TAKE ONE TABLET BY MOUTH EVERY DAY. NEW LOWER DOSE.    multivitamin Take 1 capsule by mouth daily.    polyethylene glycol Prep for Colonoscopy Procedure: Mix 15 doses/capfuls of miralax  (255 g total) with 64 fluid ounces. Begin drinking day before procedure. 4hrs before arrival time, day of procedure: mix 7 additional doses/capfuls miralax  (119 g total) with 32-64 fluid ounces and drink until complete.    polyethylene glycol To Prep for colonoscopy. Drink 8oz every 20-30 minutes until the entire bottle is consumed.    psyllium Take 1 capsule (0.52 g total) by mouth daily.    tadalafiL  Take 1-2 tablets (10-20 mg total)  by mouth daily as needed for Erectile Dysfunction.    tamsulosin  Take 1 capsule (0.4 mg total) by mouth daily.    DULoxetine  Take 1 capsule (30 mg total) by mouth daily for 7 days, THEN 2 capsules (60 mg total) daily for 83 days. Then continue 60 mg daily long term.    methocarbamoL  Take 1 tablet (500 mg total) by mouth at bedtime as needed.    predniSONE  Take 2 tablets (40 mg total) by mouth daily for 5 days. For 5 days then stop    pregabalin  Take 1 capsule (300 mg total) by mouth 2 times a day.     No current facility-administered medications for this visit.     PE:  BP 158/78   Pulse 70   Resp 12   Wt 208 lb (94.3 kg)   SpO2 96%   BMI 29.01 kg/m   Wt Readings from Last 3 Encounters:   04/24/24 208 lb (94.3 kg)   04/22/24  208 lb (94.3 kg)   02/19/24 208 lb (94.3 kg)     Today's body mass index is 29.01 kg/m.     General: Well appearing, well developed, well noursed. No acute distress.    Skin: No rashes or suspicious lesions  Cardiac: Regular rate and rhythm. No murmurs rubs or gallops.  Normal S1 and S2.    Respiratory: Clear to auscultation bilateral.  No respiratory distress.  No Tachypnea  Psych: Alert and oriented.  Normal mood and affect      A/P: Mitchell Evans is a 63 y.o. male here for worsening chronic pain - start Cymbalta , increase Lyrica     Mitchell Evans was seen today for sciatica.    Diagnoses and all orders for this visit:    Neuropathic pain (Primary)  -     DULoxetine  (CYMBALTA ) 30 MG capsule; Take 1 capsule (30 mg total) by mouth daily for 7 days, THEN 2 capsules (60 mg total) daily for 83 days. Then continue 60 mg daily long term.  -     predniSONE  (DELTASONE ) 20 MG tablet; Take 2 tablets (40 mg total) by mouth daily for 5 days. For 5 days then stop  -     pregabalin  (LYRICA ) 300 MG capsule; Take 1 capsule (300 mg total) by mouth 2 times a day.        No follow-ups on file.    Rocky Berke, MD  [facility]

## 2024-04-24 ENCOUNTER — Ambulatory Visit: Admit: 2024-04-24 | Discharge: 2024-04-24 | Payer: Medicare (Managed Care)

## 2024-04-24 DIAGNOSIS — F4323 Adjustment disorder with mixed anxiety and depressed mood: Principal | ICD-10-CM

## 2024-04-24 MED ORDER — methocarbamoL (ROBAXIN) 500 MG tablet
500 | ORAL_TABLET | Freq: Every evening | ORAL | 0 refills | 10.00000 days | Status: AC | PRN
Start: 2024-04-24 — End: ?

## 2024-04-24 NOTE — Progress Notes (Signed)
 Pre-Visit Preventative Plannning  Lab Results   Component Value Date    HGBA1C 5.0 08/01/2023     Health Maintenance   Topic Date Due    ASCVD Assessment  Never done    Immunization: Pneumococcal (2 of 2 - PCV) 06/16/2021    Colorectal Cancer Screening (MyChart)  02/01/2024    Immunization: Influenza (MyChart) (1) 06/17/2024    Renal Function/GFR  07/31/2024    Annual Medicare Wellness Visit  07/31/2024    Comprehensive Physical Exam  07/31/2024    Depression Screening  02/05/2025    Diabetes Screening  07/31/2026    Immunization: DTaP/Tdap/Td (2 - Td or Tdap) 11/15/2027    Hepatitis C Screening (MyChart)  Completed    HIV Screening  Completed    Immunization: Zoster  Completed    Immunization: RSV (Adult)  Completed     Immunization History   Administered Date(s) Administered    COVID-19, mRNA, Moderna monovalent, age 67+ 06/26/2023    COVID-19, mRNA, Pfizer bivalent booster, tris-sucrose, age 67+ 07/17/2021    COVID-19, mRNA, Pfizer monovalent 12/25/2019, 01/22/2020, 07/14/2020, 01/14/2021    COVID-19, mRNA, tris-sucrose, age 87+ (COMIRNATY) 08/04/2022    Influenza, quadrivalent, intradermal, preservative-free 06/26/2023    Influenza, quadrivalent, preservative-free 06/26/2018, 05/31/2019, 06/15/2020, 07/17/2021    Influenza, unspecified 06/28/2022    Pneumococcal polysaccharide, 23-valent 06/16/2020    RSV, recombinant, prefusion F, adjuvant (Arexvy) 06/28/2022    Zoster, recombinant 09/22/2020, 01/19/2021    tdap 11/14/2017     CC:  Chief Complaint   Patient presents with    Leg Problem     Right leg     HPI: See A&P for details of HPI    Right leg had been feeling good.  Right leg gave out and did not fall down stairs.  Using cane to ambulate.       ROS: Please see HPI    Past Medical History:  Past Medical History:   Diagnosis Date    Cervical radiculopathy 02/19/2024    Elevated prostate specific antigen (PSA) 08/02/2023    Essential hypertension     Hepatitis C 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 08/16/2021    Needs  confirmed    High blood pressure     History of suicidal ideation     Hyperlipidemia     Low serum HDL 08/16/2021    [name] (left ventricular hypertrophy) 11/28/2022    Nonrheumatic mitral valve regurgitation 11/28/2022    Normocytic anemia 11/09/2021    Prediabetes 08/16/2021    Spinal stenosis of cervical region       Current Outpatient Medications   Medication Sig    atorvastatin  TAKE ONE TABLET BY MOUTH EVERY DAY    azelastine  Use 1 spray into each nostril 2 times a day. Use in each nostril as directed    bisacodyL  Prep for Colonoscopy Procedure: The day before your procedure: take 4 tablets at 2pm. The day of your procedure take 2 tablets 4hrs prior to your arrival time.    celecoxib  Take 1 capsule (200 mg total) by mouth 2 times a day.    cholecalciferol (vitamin D3) Take by mouth.    docusate sodium  Take 1 capsule (100 mg total) by mouth 2 times a day as needed for Constipation.    DULoxetine  Take 1 capsule (30 mg total) by mouth daily for 7 days, THEN 2 capsules (60 mg total) daily for 83 days. Then continue 60 mg daily long term.    ferrous sulfate  Take 1 tablet (325 mg total) by  mouth daily with breakfast.    lidocaine  Place 1 patch onto the skin daily. Apply patch for 12 hours and then remove patch and leave off for 12 hours.    lisinopriL  TAKE ONE TABLET BY MOUTH EVERY DAY. NEW LOWER DOSE.    methocarbamoL  Take 1 tablet (500 mg total) by mouth at bedtime as needed.    multivitamin Take 1 capsule by mouth daily.    polyethylene glycol Prep for Colonoscopy Procedure: Mix 15 doses/capfuls of miralax  (255 g total) with 64 fluid ounces. Begin drinking day before procedure. 4hrs before arrival time, day of procedure: mix 7 additional doses/capfuls miralax  (119 g total) with 32-64 fluid ounces and drink until complete.    polyethylene glycol To Prep for colonoscopy. Drink 8oz every 20-30 minutes until the entire bottle is consumed.    predniSONE  Take 2 tablets (40 mg total) by mouth daily for 5 days. For 5 days  then stop    pregabalin  Take 1 capsule (300 mg total) by mouth 2 times a day.    psyllium Take 1 capsule (0.52 g total) by mouth daily.    tadalafiL  Take 1-2 tablets (10-20 mg total) by mouth daily as needed for Erectile Dysfunction.    tamsulosin  Take 1 capsule (0.4 mg total) by mouth daily.     No current facility-administered medications for this visit.       PE:  BP 153/74 (BP Location: Left upper arm, Patient Position: Sitting, BP Cuff Size: Large)   Pulse 70   Resp 16   Ht 5' 11 (1.803 m)   Wt 208 lb (94.3 kg)   BMI 29.01 kg/m   Wt Readings from Last 3 Encounters:   04/24/24 208 lb (94.3 kg)   04/22/24 208 lb (94.3 kg)   02/19/24 208 lb (94.3 kg)     Today's body mass index is 29.01 kg/m.     General: Well appearing, well developed, well noursed. No acute distress.    Skin: No rashes or suspicious lesions  Cardiac: Regular rate and rhythm. No murmurs rubs or gallops.  Normal S1 and S2.    Respiratory: Clear to auscultation bilateral.  No respiratory distress.  No Tachypnea  Psych: Alert and oriented.  Depressed mood and affect    A/P: Mitchell Evans is a 63 y.o. male with a IN PERSON visit for leg pain.  Give new Gabapentin and Cymbalta  time to work, low dosage muscle relaxer at night, look into TENS unit     Karrington Studnicka was seen today for leg problem.    Diagnoses and all orders for this visit:    Adjustment disorder with mixed anxiety and depressed mood (Primary)  -     Integrated [facility]al [facility]    Chronic pain syndrome  -     methocarbamoL  (ROBAXIN ) 500 MG tablet; Take 1 tablet (500 mg total) by mouth at bedtime as needed.      Rocky Berke, MD  [facility]

## 2024-04-29 NOTE — Telephone Encounter (Signed)
 04/24/2024 Rocky Berke, MD     05/01/2024 Auston LABOR Ringwood, LISW

## 2024-04-30 ENCOUNTER — Ambulatory Visit: Payer: Medicare (Managed Care)

## 2024-05-01 ENCOUNTER — Ambulatory Visit: Admit: 2024-05-01 | Payer: Medicare (Managed Care)

## 2024-05-01 DIAGNOSIS — F4323 Adjustment disorder with mixed anxiety and depressed mood: Principal | ICD-10-CM

## 2024-05-01 MED ORDER — tadalafiL (CIALIS) 10 MG tablet
10 | ORAL_TABLET | Freq: Every day | ORAL | 2 refills | 30.00000 days | Status: AC | PRN
Start: 2024-05-01 — End: 2024-05-28

## 2024-05-01 NOTE — Progress Notes (Signed)
 Mental Health Collaborative Care  Received referral from ERIN MOUSHEY, MD  for: stress management    Session summary:  Saw patient in treatment room for first appointment for assessment at 2:30 PM until 3:30 PM for a 60 minute(s) session.    Prompting event for beginning therapy: States having some leg complications that have alters the pt's current way of life.    Psychiatry Review of Symptoms:  Depression: depressed mood, sleep disturbance, and loss of pleasure/interest  Anxiety: excessive worry/anxiety, difficulty controlling worry, restlessness, easily fatigued, irritability, muscle tension, sleep disturbance, and rumination  Mania: None  Psychosis: None  Current auditory/visual hallucinations: Denies    Length of Time Symptoms of been present:   Recent: 12+ months    Ongoing: Middle Adulthood and on 40-65    Social History (Issues or major complications with meeting milestones):    Childhood/Adolescence/Young Adulthood None  Adulthood States he experienced a lot of racism 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   Experience with parents States some grief started and stress significantly increased since father passed in 1998.   Experience with school None  Experience with interpersonal relationships None  Experience with health States having significant amount of physical pain due to spine complications.   Legal History None  Experienced/Suspected Trauma:      Past Psychiatric History:   Prior diagnoses: None  Outpatient Treatment: States seeing a psychiatrist several years ago.   Hospitalizations: None  Suicide Attempts/Self harm: None  Aggression/Violence Towards Others: None  Access to guns: None  Medication Trials: None    Substance Use History:  Tobacco:Unable to access  Caffeine:Unable to access  Alcohol:Unable to access  Illicit Drug ldz:Wnwz    Risk Assessment:   Risk factors for violence to self/others:age < 25 or >50, male, co-morbid anxiety, decreased social support, chronic medical illness, and  chronic pain  Protective factors include:no previous suicide attempts, self-harm behaviors or aggression, marriage, spirituality, positive social support, denies hopelessness, no history of trauma, no psychosis, no cognitive dysfunction, no substance use, employed, denies SI/HI, future orientation, goal orientation, contracting for safety, collateral information is reassuring, close outpatient follow-up, compliant with recommended medications, and no access to guns or weapons    Problem List[1]    Current Outpatient Medications   Medication Sig    atorvastatin  TAKE ONE TABLET BY MOUTH EVERY DAY    azelastine  Use 1 spray into each nostril 2 times a day. Use in each nostril as directed    bisacodyL  Prep for Colonoscopy Procedure: The day before your procedure: take 4 tablets at 2pm. The day of your procedure take 2 tablets 4hrs prior to your arrival time.    celecoxib  Take 1 capsule (200 mg total) by mouth 2 times a day.    cholecalciferol (vitamin D3) Take by mouth.    docusate sodium  Take 1 capsule (100 mg total) by mouth 2 times a day as needed for Constipation.    DULoxetine  Take 1 capsule (30 mg total) by mouth daily for 7 days, THEN 2 capsules (60 mg total) daily for 83 days. Then continue 60 mg daily long term.    ferrous sulfate  Take 1 tablet (325 mg total) by mouth daily with breakfast.    lidocaine  Place 1 patch onto the skin daily. Apply patch for 12 hours and then remove patch and leave off for 12 hours.    lisinopriL  TAKE ONE TABLET BY MOUTH EVERY DAY. NEW LOWER DOSE.    methocarbamoL  Take 1 tablet (500 mg total) by  mouth at bedtime as needed.    multivitamin Take 1 capsule by mouth daily.    polyethylene glycol Prep for Colonoscopy Procedure: Mix 15 doses/capfuls of miralax  (255 g total) with 64 fluid ounces. Begin drinking day before procedure. 4hrs before arrival time, day of procedure: mix 7 additional doses/capfuls miralax  (119 g total) with 32-64 fluid ounces and drink until complete.     polyethylene glycol To Prep for colonoscopy. Drink 8oz every 20-30 minutes until the entire bottle is consumed.    pregabalin  Take 1 capsule (300 mg total) by mouth 2 times a day.    psyllium Take 1 capsule (0.52 g total) by mouth daily.    tadalafiL  Take 1-2 tablets (10-20 mg total) by mouth daily as needed for Erectile Dysfunction.    tamsulosin  Take 1 capsule (0.4 mg total) by mouth daily.     No current facility-administered medications for this visit.       Family History:   Psychiatric diagnoses: None  Completed suicide: None  Family History   Problem Relation Age of Onset    Colon Cancer Mother     Cancer Mother         Colon cancer    Alzheimer's disease Father     Diabetes Sister     Diabetes Sister        Social History: (e.g., living arrangements, social supports, quality of relationships with social supports, )  States currently living with wife and have been together for 20 years.      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 States he experienced a lot of opportunities for growth and experienced several different obstacles.     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      Health Literacy: (e.g., ability to navigate relevant service systems, understanding of medical issue being treated)  The pt understands services provided.     Strengths:  Flexibility, ability to communicate     Stressors/Areas of Improvement:   Vulnerability (self fulfillment)     Barriers to treatment:  None stated     Positive activities:   Unable to access     Sleep:  States has difficulty falling asleep and staying asleep.     Medical History/Diet/Exercise:  Pt reports being in significant amount of  pain that affects all areas and exercise.       Mental Status Exam:     Attitude:  cooperative  Speech:  normal rate.    Mood:  depressed and anxious   Affect:  stable and sad   Thought Processes:   well organized and goal directed  Thought content: no delusions    Suicidal ideation: none   Homicidal ideation: none  Perceptions:  no hallucinations or other abnormalities   Cognition:  short term and long term memory intact, alert, recall intact, executive function intact, well oriented to person/ place and time, and no deficits evident.   Fund of Knowledge:  average  Insight/ judgement: fair    Initial diagnosis: Adjustment Disorder    Recommendations for treatment: counseling per 2 weeks. Monitor with PHQ9 and GAD7 per 2 weeks. CBT.     Further screening needed: None needed    Patient ability and capacity to  respond to treatment: The pt has the ability to engage in a treatment relationship    Goal: To reduce symptoms of depression and/or anxiety to half of the baseline as measured by PHQ9 and GAD7 (for baseline of 10 or greater).  Baseline PHQ9 of 9 or lower   Baseline GAD7 of 9 or lower    PHQ-9 Scores:        No data to display                GAD7 Scores:        No data to display                 Patient identified goal:reduce symptom distress    Preferences:    Minutes:    Follow up:  Future Appointments   Date Time Provider Department Center   05/01/2024  2:30 PM Lainey Nelson A Angeliyah Kirkey, LISW [facility] [place]   05/31/2024  2:00 PM [facility]       Auston Calkins LISW  Behavioral Health Therapist  [facility]  [phone]        [1]   Patient Active Problem List  Diagnosis    Prediabetes    Low serum HDL    Hepatitis C 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    Iron  deficiency anemia due to chronic blood loss    Bilateral sciatica    Essential hypertension    Nonrheumatic mitral valve regurgitation    [name] (left ventricular hypertrophy)    Elevated prostate specific antigen (PSA)    Benign prostatic  hyperplasia with lower urinary tract symptoms    Erectile dysfunction    Atherosclerosis of aorta (CMS-HCC)    Cervical radiculopathy

## 2024-05-06 MED ORDER — azelastine (ASTELIN) 137 mcg (0.1 %) nasal spray
137 | Freq: Two times a day (BID) | NASAL | 1 refills | 30.00000 days | Status: AC
Start: 2024-05-06 — End: 2024-06-30

## 2024-05-06 NOTE — Telephone Encounter (Signed)
 05/01/2024 Daryl A Ringwood, LISW     05/15/2024 Daryl A Ringwood, LISW

## 2024-05-15 ENCOUNTER — Ambulatory Visit: Payer: Medicare (Managed Care)

## 2024-05-29 NOTE — Telephone Encounter (Signed)
 05/01/2024 Daryl A Ringwood, LISW     Visit date not found

## 2024-05-30 MED ORDER — tadalafiL (CIALIS) 10 MG tablet
10 | ORAL_TABLET | Freq: Every day | ORAL | 2 refills | 30.00000 days | PRN
Start: 2024-05-30 — End: 2024-06-30

## 2024-05-30 NOTE — Telephone Encounter (Signed)
 Burnard from [facility] called requesting clarification on the following order:    Order: MRI Cervical Spine WO Contrast    Clarification needed: States it needs to be coded under [address] in order to be done at [facility].    Case Number: [id number]    Burnard requesting this to be updated ASAP, states will be closed today if not.     Please return call to [phone] with any questions .

## 2024-05-30 NOTE — Telephone Encounter (Signed)
 Called and left VM for patient that his MRI orders may need to be faxed to [facility] due to insurance issues. Left call back number for patient to discuss further and let me know where he would like the orders faxed.

## 2024-05-31 ENCOUNTER — Inpatient Hospital Stay: Payer: Medicare (Managed Care)

## 2024-06-04 NOTE — Telephone Encounter (Signed)
 Pt returned call to discuss the insurance issues and the MRI order. Requesting a call back. Please advise.    [phone]

## 2024-06-04 NOTE — Telephone Encounter (Signed)
 Returned call to pt and relayed information to pt about insurance issue. Pt stated he will call back with [facility] location and fax number to send orders to.

## 2024-06-11 ENCOUNTER — Ambulatory Visit: Admit: 2024-06-11 | Discharge: 2024-06-11 | Payer: Medicare (Managed Care)

## 2024-06-11 ENCOUNTER — Other Ambulatory Visit: Admit: 2024-06-11 | Payer: Medicare (Managed Care)

## 2024-06-11 DIAGNOSIS — Z5181 Encounter for therapeutic drug level monitoring: Principal | ICD-10-CM

## 2024-06-11 LAB — BASIC METABOLIC PANEL
Anion Gap: 7 mmol/L (ref 3–16)
BUN: 19 mg/dL (ref 7–25)
CO2: 30 mmol/L (ref 21–33)
Calcium: 9.1 mg/dL (ref 8.6–10.3)
Chloride: 105 mmol/L (ref 98–110)
Creatinine: 0.74 mg/dL (ref 0.60–1.30)
EGFR: >90
Glucose: 88 mg/dL (ref 70–100)
Osmolality, Calculated: 296 mosm/kg (ref 278–305)
Potassium: 4.1 mmol/L (ref 3.5–5.3)
Sodium: 142 mmol/L (ref 133–146)

## 2024-06-11 MED ORDER — lisinopriL (PRINIVIL) 5 MG tablet
5 | Freq: Every day | ORAL | 1.00 refills | 30.00000 days | Status: AC
Start: 2024-06-11 — End: ?

## 2024-06-11 NOTE — Progress Notes (Signed)
 Pre-Visit Preventative Plannning  Lab Results   Component Value Date    HGBA1C 5.0 08/01/2023     Health Maintenance   Topic Date Due    ASCVD Assessment  Never done    Immunization: Pneumococcal (2 of 2 - PCV) 06/16/2021    Colorectal Cancer Screening (MyChart)  02/01/2024    Immunization: Influenza (MyChart) (1) 06/17/2024    Renal Function/GFR  07/31/2024    Annual Medicare Wellness Visit  07/31/2024    Comprehensive Physical Exam  07/31/2024    Depression Monitoring (PHQ-9)  11/13/2024    Diabetes Screening  07/31/2026    Immunization: DTaP/Tdap/Td (2 - Td or Tdap) 11/15/2027    Hepatitis C Screening (MyChart)  Completed    HIV Screening  Completed    Immunization: Zoster  Completed    Immunization: RSV (Adult)  Completed     Immunization History   Administered Date(s) Administered    COVID-19, mRNA, Moderna monovalent, age 60+ 06/26/2023    COVID-19, mRNA, Pfizer bivalent booster, tris-sucrose, age 60+ 07/17/2021    COVID-19, mRNA, Pfizer monovalent 12/25/2019, 01/22/2020, 07/14/2020, 01/14/2021    COVID-19, mRNA, tris-sucrose, age 63+ (COMIRNATY) 08/04/2022    Influenza, quadrivalent, intradermal, preservative-free 06/26/2023    Influenza, quadrivalent, preservative-free 06/26/2018, 05/31/2019, 06/15/2020, 07/17/2021    Influenza, unspecified 06/28/2022    Pneumococcal polysaccharide, 23-valent 06/16/2020    RSV, recombinant, prefusion F, adjuvant (Arexvy) 06/28/2022    Zoster, recombinant 09/22/2020, 01/19/2021    tdap 11/14/2017     CC:  Chief Complaint   Patient presents with    Medication Problem     Cymbalta      HPI: See A&P for details of HPI    ROS: Please see HPI    Past Medical History:  Past Medical History:   Diagnosis Date    Cervical radiculopathy 02/19/2024    Elevated prostate specific antigen (PSA) 08/02/2023    Essential hypertension     Hepatitis C 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 08/16/2021    Needs confirmed    High blood pressure     History of suicidal ideation     Hyperlipidemia     Low serum HDL  08/16/2021    [name] (left ventricular hypertrophy) 11/28/2022    Nonrheumatic mitral valve regurgitation 11/28/2022    Normocytic anemia 11/09/2021    Prediabetes 08/16/2021    Spinal stenosis of cervical region       Current Outpatient Medications   Medication Sig    atorvastatin  TAKE ONE TABLET BY MOUTH EVERY DAY    azelastine  Use 1 spray into each nostril 2 times a day. Use in each nostril as directed    bisacodyL  Prep for Colonoscopy Procedure: The day before your procedure: take 4 tablets at 2pm. The day of your procedure take 2 tablets 4hrs prior to your arrival time.    celecoxib  Take 1 capsule (200 mg total) by mouth 2 times a day.    cholecalciferol (vitamin D3) Take by mouth.    DULoxetine  Take 1 capsule (30 mg total) by mouth daily for 7 days, THEN 2 capsules (60 mg total) daily for 83 days. Then continue 60 mg daily long term.    lidocaine  Place 1 patch onto the skin daily. Apply patch for 12 hours and then remove patch and leave off for 12 hours.    lisinopriL  Take 2 tablets (10 mg total) by mouth daily.    methocarbamoL  Take 1 tablet (500 mg total) by mouth at bedtime as needed.    multivitamin Take 1  capsule by mouth daily.    pregabalin  Take 1 capsule (300 mg total) by mouth 2 times a day.    psyllium Take 1 capsule (0.52 g total) by mouth daily.    tadalafiL  Take 1-2 tablets (10-20 mg total) by mouth daily as needed for Erectile Dysfunction.    tamsulosin  Take 1 capsule (0.4 mg total) by mouth daily.    polyethylene glycol Prep for Colonoscopy Procedure: Mix 15 doses/capfuls of miralax  (255 g total) with 64 fluid ounces. Begin drinking day before procedure. 4hrs before arrival time, day of procedure: mix 7 additional doses/capfuls miralax  (119 g total) with 32-64 fluid ounces and drink until complete.    polyethylene glycol To Prep for colonoscopy. Drink 8oz every 20-30 minutes until the entire bottle is consumed.     No current facility-administered medications for this visit.       PE:  BP 160/71    Pulse 65   Resp 12   Ht 5' 11 (1.803 m)   Wt 206 lb 6.4 oz (93.6 kg)   SpO2 97%   BMI 28.79 kg/m   Wt Readings from Last 3 Encounters:   06/11/24 206 lb 6.4 oz (93.6 kg)   04/24/24 208 lb (94.3 kg)   04/22/24 208 lb (94.3 kg)     Today's body mass index is 28.79 kg/m.     General: Well appearing, well developed, well noursed. No acute distress.    Skin: No rashes or suspicious lesions  Cardiac: Regular rate and rhythm. No murmurs rubs or gallops.  Normal S1 and S2.    Respiratory: Clear to auscultation bilateral.  No respiratory distress.  No Tachypnea  Psych: Alert and oriented.  Normal mood and affect    A/P: Mitchell Evans is a 63 y.o. male with a IN PERSON visit for med check - having some ejaculation problems with Cymbalta  but has helped pain significantly so done not want to stop.  BP above goal on repeat.  Double dosage, check baseline lab, recheck in Oct at physical    Mitchell Evans was seen today for medication problem.    Diagnoses and all orders for this visit:    Medication monitoring encounter (Primary)  -     Basic metabolic panel; Future    Essential hypertension  -     lisinopriL  (PRINIVIL ) 5 MG tablet; Take 2 tablets (10 mg total) by mouth daily.      No follow-ups on file.    Rocky Berke, MD  [facility]

## 2024-06-27 ENCOUNTER — Ambulatory Visit: Admit: 2024-06-27 | Payer: Medicare (Managed Care)

## 2024-06-27 MED FILL — FLUCELVAX 0.5ML SUSY: 0.5 0.5 ML | 1 days supply | Qty: 0.5 | Fill #0 | Status: AC

## 2024-07-01 MED ORDER — AZELASTINE 137 MCG (0.1 %) NASAL SPRAY
137 | Freq: Two times a day (BID) | NASAL | 5 refills | 50.00000 days
Start: 2024-07-01 — End: 2024-07-31

## 2024-07-01 MED ORDER — TADALAFIL 10 MG TABLET
10 | ORAL_TABLET | Freq: Every day | ORAL | 5 refills | 30.00000 days | PRN
Start: 2024-07-01 — End: 2024-07-31

## 2024-07-01 NOTE — Telephone Encounter (Signed)
 06/11/2024 Rocky Berke, MD     07/03/2024 Rocky Berke, MD

## 2024-07-03 ENCOUNTER — Other Ambulatory Visit: Admit: 2024-07-03 | Payer: Medicare (Managed Care)

## 2024-07-03 ENCOUNTER — Ambulatory Visit: Admit: 2024-07-03 | Payer: Medicare (Managed Care)

## 2024-07-03 DIAGNOSIS — Z0000 Encounter for general adult medical examination without abnormal findings: Principal | ICD-10-CM

## 2024-07-03 LAB — COMPREHENSIVE METABOLIC PANEL, SERUM
ALT: 30 U/L (ref 7–52)
AST (SGOT): 36 U/L (ref 13–39)
Albumin: 4.3 g/dL (ref 3.5–5.7)
Alkaline Phosphatase: 82 U/L (ref 36–125)
Anion Gap: 11 mmol/L (ref 3–16)
BUN: 13 mg/dL (ref 7–25)
CO2: 25 mmol/L (ref 21–33)
Calcium: 8.9 mg/dL (ref 8.6–10.3)
Chloride: 103 mmol/L (ref 98–110)
Creatinine: 0.74 mg/dL (ref 0.60–1.30)
EGFR: >90
Glucose: 83 mg/dL (ref 70–100)
Osmolality, Calculated: 287 mosm/kg (ref 278–305)
Potassium: 4.1 mmol/L (ref 3.5–5.3)
Sodium: 139 mmol/L (ref 133–146)
Total Bilirubin: 0.8 mg/dL (ref 0.0–1.5)
Total Protein: 7 g/dL (ref 6.4–8.9)

## 2024-07-03 LAB — HEPATITIS C RNA, QUANTITATIVE PCR: International Units: NOT DETECTED [IU]/mL

## 2024-07-03 LAB — CHLAMYDIA / GONORRHOEAE DNA URINE
Chlamydia Trachomatis DNA Urine: NEGATIVE
Neisseria gonorrhoeae DNA Urine: NEGATIVE

## 2024-07-03 LAB — HIV 1+2 ANTIBODY/ANTIGEN WITH REFLEX: HIV 1+2 AB/AGN: NONREACTIVE

## 2024-07-03 LAB — CBC
Hematocrit: 37.8 % — ABNORMAL LOW (ref 38.5–50.0)
Hemoglobin: 13.4 g/dL (ref 13.2–17.1)
MCH: 34.4 pg — ABNORMAL HIGH (ref 27.0–33.0)
MCHC: 35.4 g/dL (ref 32.0–36.0)
MCV: 97.1 fL (ref 80.0–100.0)
MPV: 10.1 fL (ref 7.5–11.5)
Platelets: 194 10E3/uL (ref 140–400)
RBC: 3.89 10E6/uL — ABNORMAL LOW (ref 4.20–5.80)
RDW: 12.7 % (ref 11.0–15.0)
WBC: 8.9 10E3/uL (ref 3.8–10.8)

## 2024-07-03 LAB — PSA TOTAL, SCREENING: PSA: 5.04 ng/mL — ABNORMAL HIGH (ref 0.0–4.0)

## 2024-07-03 LAB — LIPID PANEL
Cholesterol, Total: 130 mg/dL (ref 0–200)
HDL: 38 mg/dL — ABNORMAL LOW (ref 60–92)
LDL Cholesterol: 79 mg/dL
Non-HDL Cholesterol, Calculated: 92 mg/dL (ref 0–129)
Triglycerides: 66 mg/dL (ref 10–149)

## 2024-07-03 LAB — TREPONEMA PALLIDUM AB WITH REFLEX: Treponema Pallidum: NEGATIVE

## 2024-07-03 LAB — HEMOGLOBIN A1C: Hemoglobin A1C: 5 % (ref 4.0–5.6)

## 2024-07-03 LAB — HEPATITIS B SURFACE ANTIGEN: Hep B Surface Ag: NONREACTIVE

## 2024-07-03 LAB — HEPATITIS C ANTIBODY: HCV Ab: REACTIVE — AB

## 2024-07-03 NOTE — Progress Notes (Signed)
 [facility]  [id number]  [place] [address]    Name:  Mitchell Evans   Date of Birth: Sep 27, 1961 (63 y.o.)     MRN: [id number]    Date of Service:  07/03/2024       Subjective:     History of Present Illness:  Mitchell Evans is a(n) 63 y.o. male here today for annual wellness exam.    I have reviewed the Health Risk Assessment Questionnaire (documented below or scanned in media tab) completed by patient.        07/01/2024 07/30/2023   HRA/AWV (Annual Wellness Visit Questionnaire)   During the past 4 weeks, would you rate your health in general as good or better? Yes No    In the past 4 weeks, has bodily pain caused daily problems for you?  No Yes   Do you suffer from chronic fatigue? No No   Do you have concerns about your vision or your hearing? No    Are you or your family concerned about your memory? No    Do you have a Medical Power of Attorney or Living Will for your healthcare goals?  No    Has food intake declined over the past 3 months due to dental problems, loss of appetite, chewing, or swallowing difficulties, or lack of access to food? Yes    Do you eat at least 5 servings of fruits and vegetables a day? No    Do you need help with eating, bathing, dressing, walking, or using the toilet?  No    Do you need help from others to use the phone, clean your home, shop, prepare food, manage money, or for transportation? No    Have you had any falls or near falls in the last 90 days? No    Has your home been evaluated to reduce the risk for falls?   No    Are you having difficulty driving or is anyone concerned about your driving? No    Do you always fasten your seat belt in a car? Yes Yes   Do you feel safe at home? Yes Yes   Do you frequently feel dizzy when you stand up? No No   If you take prescription medication(s): Do you have any difficulties with getting or taking medications every day as prescribed? No or N/A    Do you experience problems with leaking  urine frequently? No    If youre sexually active, do you have any concerns? No or N/A    Do you live alone? Yes    Do you have social and emotional support? No    Feeling angry Sometimes    Feeling lonely Sometimes    How often do you have a drink containing alcohol?  Never    How many drinks containing alcohol do you have on a typical day when you are drinking? Do not drink    How often do you have 6 or more drinks on one occasion? Never    AUDIT-C Score 0     Are you a tobacco user? Never No    Are you currently taking opioids/narcotics (such as morphine or oxycodone ) that are prescribed for you by a doctor? No No    How many times in past year have you used recreational drugs (Cannabis/Marijuana, cocaine, heroin)? 1 or more    On average, how many days per week do you engage in moderate to strenuous  exercise (like a brisk walk)? 7 days    On average, when you exercise, how many minutes do you engage in exercise at this level? > 60 min        Patient-reported    Data saved with a previous flowsheet row definition         07/01/2024 02/06/2024   PHQ-2/PHQ-9   Little interest or pleasure in doing things Several days Not at all   Feeling down, depressed, or hopeless Several days Not at all   PHQ-2 Total Score 2  0       Patient-reported     Denies fever, unintentional weight loss.  Denies CP, palpitations, SOB or cough.  Denies abdominal pain, blood in stool, diarrhea or constipation.  Denies leg swelling.      ASCVD Risk Assessment  The 10-year ASCVD risk score (Arnett DK, et al., 2019) is: 10.5%    Values used to calculate the score:      Age: 52 years      Sex: Male      Is Non-Hispanic African American: Yes      Diabetic: No      Tobacco smoker: No      Systolic Blood Pressure: 106 mmHg      Is BP treated: Yes      HDL Cholesterol: 35 mg/dL      Total Cholesterol: 132 mg/dL    I spent between 4-84 minutes in assessing for atherosclerotic cardiovascular disease (ASCVD) risk.      ASCVD Risk Counseling    We often  recommend that patients who have a risk greater than 7.5% take a cholesterol lowering medication to reduce their risk    Patient is currently on Lipitor 20 mg  Blood pressure is being managed.  Smoking, alcohol, other drug use status documented elsewhere in this encounter and have been addressed appropriately.  Physical activity and nutrition reviewed with patient  If applicable weight loss recommended. Handout given  Patient to report any concerns regarding current management plan.     I spent between 5-15 minutes counseling for atherosclerotic cardiovascular disease (ASCVD) risk.        Comments:     The following portions of the patient's history were reviewed and updated as appropriate.  past medical history, past surgical history, current medications, allergies, social history, family history, and immunization history    Reviewed patient care team including self - referred specialists.  Patient Care Team:  Rocky Berke, MD as PCP - General (Family Medicine)    Other HPI / Medical issues or concerns for this visit:  HPI      Review of Systems         Objective:     Vitals:    07/03/24 1003   BP: 106/71   BP Location: Right upper arm   Patient Position: Sitting   BP Cuff Size: Large   Pulse: 80   Resp: 12   SpO2: 97%   Weight: 199 lb (90.3 kg)   Height: 5' 11 (1.803 m)     Body mass index is 27.75 kg/m.    Vision screen:No results found.    Memory Screening  Mini-Cog:Pass      08/01/2023     2:00 PM   AMB MINI-COG   Word Administration V6   Clock Drawing 2   Three word recall: Ask the patient to repeat the 3 previously stated words.  3   Mini Cog Score 5  Physical Exam  General: Well appearing, well nourished, well developed. No acute distress.  Skin: No rash or suspicious lesions  HEENT: oral pharynx normal, TMs normal, no thyromegaly  Lymphatic: No LAD of anterior cervical and supraclavicular  Cardiac: Regular rate and rhythm. No murmurs rubs or gallops.  Normal S1 and S2.  No JVD. Radial pulse  strong  Respiratory: Clear to auscultation bilateral.  No respiratory distress.  No tachypnea  Abdominal: Soft, non-tender, non-distended, flat, normal BS, No masses or HSM   Vascular: No edema  Neuro: 5/5 strength in all extremities  Psych: Alert and oriented.  Normal mood and affect           Assessment/Plan:     Mitchell Evans is a 63 y.o. male who completed the Medicare Wellness Visit today.    Discussion of Advance Directives:  Advance directives were discussed today and patient will complete new documents to be returned at a later date.    Personalized Preventative Plan of Care reviewed:   Upcoming Health Maintenance       ASCVD Assessment (Yearly)  Never done    Diabetes Screening (Every 3 Years)  Order placed this encounter    Comprehensive Physical Exam (Yearly)  Next due on 07/31/2024    Renal Function/GFR (Yearly)  Next due on 06/11/2025    Depression Monitoring (PHQ-9) (Yearly)  Next due on 07/03/2025    Annual Medicare Wellness Visit (Yearly)  Next due on 07/03/2025    Colorectal Cancer Screening (MyChart) (Cologuard (FIT-DNA) - Every 3 Years)  Next due on 01/31/2027    Immunization: DTaP/Tdap/Td (2 - Td or Tdap)  Next due on 11/15/2027            Counseling performed based on HRA pertaining to ADLs/IADLs, Diet/Exercise, Psychosocial and Behavioral needs, Prevention of chronic diseases, and management and coordination of services.    Risk Factors and Conditions Identified:   No additional risk factors or conditions identified.      Recommended Interventions or Referrals:  Orders Placed This Encounter    Pneumococcal conjugate vaccine PCV20    CBC    Comprehensive Metabolic Panel, Serum    Hemoglobin A1c    Lipid Profile    PSA Total, Screening    Hepatitis C RNA, Quantitative PCR    Chlamydia / Gonorrhoeae DNA Urine    Hepatitis B surface antigen    Hepatitis C Antibody    HIV 1+2 Antibody/Antigen with Reflex    Syphilis Screening [facility])      In addition to [facility], the following conditions were  addressed today:   Problem List Items Addressed This Visit          Unprioritized    Benign prostatic hyperplasia with lower urinary tract symptoms    Essential hypertension    Hepatitis C 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    Relevant Orders    Hepatitis C RNA, Quantitative PCR     Other Visit Diagnoses         Well adult exam    -  Primary    Relevant Orders    CBC    Comprehensive Metabolic Panel, Serum    Hemoglobin A1c    Lipid Profile    PSA Total, Screening    Chlamydia / Gonorrhoeae DNA Urine    Hepatitis B surface antigen    Hepatitis C Antibody    HIV 1+2 Antibody/Antigen with Reflex    Syphilis Screening [facility])      Chronic pain syndrome  BMI is above normal. Discussion was deferred due to other visit priorities.         Return in about 4 months (around 11/02/2024) for In Office Visit - med check.     Catalea Labrecque, MD

## 2024-07-03 NOTE — Patient Instructions (Signed)
 LDL = your bad cholesterol, the lower the better  HDL = your good cholesterol, the higher the better    In general we recommend the following tips to help you improve your cholesterol   Quit smoking if you smoke  Try to look 5% of you body weight if your BMI is over 30.  Body mass index is 27.75 kg/m.  Start reading nutrition labels for all foods  Consume a dietary pattern that emphasizes intake of vegetables, fruits, and whole grains; includes low-fat dairy products, poultry, fish, legumes, non-tropical vegetable oils and nuts  Limits intake of sweets, sugar-sweetened beverages and red meat  Aim for aerobic physical activity (such as brisk walking) for >150 minutes/week (equal to >30 minutes/day, most days of the week). Higher levels of physical activity, approximately 200 to 300 minutes/week, are recommended to maintain lost weight or minimize weight regain long-term (>1 year).  Decrease saturated & trans fat - Saturated fat (found in meat, poultry fat, lard, butter, dairy) should be less than 7% of total calories per day  Dietary cholesterol should be less than 200 mg per day  Increase monounsaturated fats in your diet. Monounsaturated fats include canola oil, avocado oil, or olive oil  Increase soluble fiber in diet     Check out the following websites for good resources for healthy lifestyle recommendations:         http://www.harvey.com/         www.helpguide.org         www.diabetes.org

## 2024-07-10 ENCOUNTER — Ambulatory Visit: Payer: Medicare (Managed Care)

## 2024-07-18 ENCOUNTER — Ambulatory Visit: Payer: Medicare (Managed Care)

## 2024-07-19 MED ORDER — pregabalinLYRICA300MGcapsule
300 | ORAL_CAPSULE | ORAL | 0 refills | 30.00000 days | Status: AC
Start: 2024-07-19 — End: 2024-10-17

## 2024-07-19 MED ORDER — DULoxetineCYMBALTA60MGcapsule
60 | ORAL_CAPSULE | Freq: Every day | ORAL | 0 refills | 30.00000 days | Status: AC
Start: 2024-07-19 — End: ?

## 2024-07-19 NOTE — Telephone Encounter (Signed)
 [name] was pulled and printed for your review.   07/03/2024 Rocky Berke, MD  Visit date not found

## 2024-07-24 ENCOUNTER — Ambulatory Visit: Payer: Medicare (Managed Care)

## 2024-07-26 ENCOUNTER — Ambulatory Visit: Payer: Medicare (Managed Care)

## 2024-08-01 MED ORDER — AZELASTINE 137 MCG (0.1 %) NASAL SPRAY
137 | Freq: Two times a day (BID) | NASAL | 0 refills | 50.00000 days
Start: 2024-08-01 — End: 2024-09-02

## 2024-08-01 MED ORDER — TADALAFIL 10 MG TABLET
10 | ORAL_TABLET | Freq: Every day | ORAL | 0 refills | 30.00000 days | PRN
Start: 2024-08-01 — End: 2024-09-05

## 2024-08-01 NOTE — Telephone Encounter (Signed)
 07/03/2024 Mitchell Berke, MD  Visit date not found

## 2024-08-15 ENCOUNTER — Ambulatory Visit: Admit: 2024-08-15 | Discharge: 2024-08-19 | Payer: Medicare (Managed Care)

## 2024-08-15 ENCOUNTER — Ambulatory Visit: Admit: 2024-08-15 | Payer: Medicare (Managed Care)

## 2024-08-15 DIAGNOSIS — R972 Elevated prostate specific antigen [PSA]: Secondary | ICD-10-CM

## 2024-08-15 LAB — PSA TOTAL, DIAGNOSTIC: PSA: 3.71 ng/mL (ref 0.0–4.0)

## 2024-08-15 MED ORDER — TAMSULOSIN 0.4 MG CAPSULE
0.4 | ORAL_CAPSULE | Freq: Every day | ORAL | 1 refills | 30.00000 days
Start: 2024-08-15 — End: 2024-11-02

## 2024-08-15 NOTE — Progress Notes (Signed)
 Review of Systems   Constitutional:  Negative for chills, fever and malaise/fatigue.   HENT:  Negative for congestion and sore throat.    Respiratory:  Negative for cough, shortness of breath and wheezing.    Cardiovascular:  Negative for chest pain, palpitations and leg swelling.   Gastrointestinal:  Negative for abdominal pain, constipation and diarrhea.   Genitourinary:  Negative for dysuria, flank pain, frequency, hematuria and urgency.   Musculoskeletal:  Negative for back pain, joint pain and neck pain.   Neurological:  Negative for dizziness and headaches.   Psychiatric/Behavioral:  Negative for depression. The patient is not nervous/anxious.

## 2024-08-15 NOTE — Progress Notes (Signed)
 UROLOGY H&P NOTE    Patient: Mitchell Evans    Chief Complaint: f/u    HPI: Mitchell Evans is a 64 y.o. male with PMHx of ED on Cialis  10 mg PRN (failed Viagra ), [name], HTN, LUTS on Flomax  0.4 mg nightly, [name], and other medical issues as listed below initially referred for evaluation of elevated PSA.   - Family hx of GU cancer: Maternal uncle - bladder cancer (died d/t complications).    PSA   - 11/08/21: 3.7   - 08/01/23: 5.29   - 08/25/23: 3.7  - 07/03/24: 5.04       Today, pt presents for follow up.   Multiple visits cancelled / no showed since initial office visit. Prostate MRI not completed.   Denies fever/chills, fatigue, abdominal/flank pain, dysuria, hematuria, unintentional weight loss, new abnormal bone pain.     Medical History:  Past Medical History:   Diagnosis Date    Cervical radiculopathy 02/19/2024    Elevated prostate specific antigen (PSA) 08/02/2023    Essential hypertension     Hepatitis C 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 08/16/2021    Needs confirmed    High blood pressure     History of suicidal ideation     Hyperlipidemia     Low serum HDL 08/16/2021    [name] (left ventricular hypertrophy) 11/28/2022    Nonrheumatic mitral valve regurgitation 11/28/2022    Normocytic anemia 11/09/2021    Prediabetes 08/16/2021    Spinal stenosis of cervical region      Past Surgical History:   Procedure Laterality Date    BACK SURGERY  2003    CERVICAL FUSION      KNEE SURGERY      SPINE SURGERY  2003    WISDOM TOOTH EXTRACTION       Medications:   Outpatient Meds:  Previous Medications    ATORVASTATIN  (LIPITOR) 20 MG TABLET    TAKE ONE TABLET BY MOUTH EVERY DAY    AZELASTINE  (ASTELIN ) 137 MCG (0.1 %) NASAL SPRAY    Use 1 spray into each nostril 2 times a day. Use in each nostril as directed    CELECOXIB  (CELEBREX ) 200 MG CAPSULE    Take 1 capsule (200 mg total) by mouth 2 times a day.    CHOLECALCIFEROL, VITAMIN D3, 125 MCG (5,000 UNIT) TAB    Take by mouth.    DULOXETINE  (CYMBALTA ) 60 MG CAPSULE    Take 1 capsule (60 mg  total) by mouth daily.    LIDOCAINE  (LIDODERM ) 5 %    Place 1 patch onto the skin daily. Apply patch for 12 hours and then remove patch and leave off for 12 hours.    LISINOPRIL  (PRINIVIL ) 5 MG TABLET    Take 2 tablets (10 mg total) by mouth daily.    METHOCARBAMOL  (ROBAXIN ) 500 MG TABLET    Take 1 tablet (500 mg total) by mouth at bedtime as needed.    MULTIVITAMIN CAPSULE    Take 1 capsule by mouth daily.    PREGABALIN  (LYRICA ) 300 MG CAPSULE    TAKE ONE CAPSULE BY MOUTH TWICE A DAY *DOSE INCREASE*    PSYLLIUM 0.52 GRAM CAPSULE    Take 1 capsule (0.52 g total) by mouth daily.    TADALAFIL  (CIALIS ) 10 MG TABLET    Take 1-2 tablets (10-20 mg total) by mouth daily as needed for Erectile Dysfunction.    TAMSULOSIN  (FLOMAX ) 0.4 MG CAP    Take 1 capsule (0.4 mg total) by mouth daily.  Allergies: No Known Drug Allergies or Adverse Reactions    SH:   Social History     Tobacco Use    Smoking status: Never     Passive exposure: Past    Smokeless tobacco: Never   Substance Use Topics    Alcohol use: Not Currently     FH: Uncle with bladder cancer    ROS:   Constitutional: Negative for chills, fever and malaise/fatigue.   HENT: Negative for congestion and sore throat.    Respiratory: Negative for cough, shortness of breath and wheezing.    Cardiovascular: Negative for chest pain, palpitations and leg swelling.   Gastrointestinal: Negative for abdominal pain, constipation and diarrhea.   Genitourinary: Negative for dysuria, flank pain, frequency, hematuria and urgency.   Musculoskeletal: Negative for back pain, joint pain and neck pain.   Neurological: Negative for dizziness and headaches.   Psychiatric/Behavioral: Negative for depression. The patient is not nervous/anxious.     Objective:  Vitals:    08/15/24 1324   BP: 156/78   Pulse: 57   SpO2: 98%     Physical Examination:  Gen: No apparent distress, alert and oriented x3  CV: Bradycardic  Chest: No respiratory distress    Labs:  PSA   Date Value Ref Range Status    07/03/2024 5.04 (H) 0.0 - 4.0 ng/mL Final   08/25/2023 3.7 0.0 - 4.0 ng/mL Final     Comment:     Roche ECLIA methodology.  According to the American Urological Association, Serum PSA should  decrease and remain at undetectable levels after radical  prostatectomy. The AUA defines biochemical recurrence as an initial  PSA value 0.2 ng/mL or greater followed by a subsequent confirmatory  PSA value 0.2 ng/mL or greater.  Values obtained with different assay methods or kits cannot be used  interchangeably. Results cannot be interpreted as absolute evidence  of the presence or absence of malignant disease.     08/01/2023 5.29 (H) 0.0 - 4.0 ng/mL Final   11/08/2021 3.70 0.0 - 4.0 ng/mL Final     Lab Results   Component Value Date    COLORU Colorless (A) 11/24/2022    CLARITYU Clear 11/24/2022    PROTEINUA Negative 11/24/2022    PHUR 6.0 11/24/2022    LABSPEC 1.009 11/24/2022    GLUCOSEU Negative 11/24/2022    BLOODU Negative 11/24/2022    LEUKOCYTESUR Negative 11/24/2022    NITRITE Negative 11/24/2022    BILIRUBINUR Negative 11/24/2022    UROBILINOGEN <2.0 11/24/2022    RBCUA 2 06/30/2022    WBCUA 14 (H) 06/30/2022    BACTERIA Rare (A) 06/30/2022     Imaging:   No results found.    Assessment:     ICD-10-CM    1. Elevated prostate specific antigen (PSA)  R97.20 PSA Total, Diagnostic     MRI Prostate W WO contrast     MRI 3D rendering on Workstation        63 yo M with PMHx of ED on Cialis  10 mg PRN (failed Viagra ), [name], HTN, LUTS on Flomax  0.4 mg nightly, [name] initially referred for evaluation of elevated PSA.   - Family hx of GU cancer: Maternal uncle - bladder cancer (died d/t complications).    PSA   - 11/08/21: 3.7   - 08/01/23: 5.29   - 08/25/23: 3.7  - 07/03/24: 5.04       - Presents for follow up.   - Multiple visits cancelled / no showed since initial office  visit. Prostate MRI not completed.   - Denies fever/chills, fatigue, abdominal/flank pain, dysuria, hematuria, unintentional weight loss, new abnormal bone  pain.     Plan:     - Re-check PSA today.   - Discussed implications of elevated PSA and risk of prostate cancer.   - Recommend further evaluation by prostate MRI followed by fusion bx of prostate (if MRI demonstrates suspicious lesions).  - Pt concerned with cost of MRI. Gave pt information for financial services. If MRI projected to be too expensive, discussed completing MRI at [facility].   - Follow up after imaging to review.     Ole Grippe, PA-C  08/15/2024

## 2024-08-15 NOTE — Telephone Encounter (Signed)
 07/03/2024 Mitchell Berke, MD  Visit date not found

## 2024-08-20 NOTE — Telephone Encounter (Signed)
 07/03/2024 Rocky Berke, MD  Visit date not found

## 2024-08-21 MED ORDER — CELECOXIB 200 MG CAPSULE
200 | ORAL_CAPSULE | Freq: Two times a day (BID) | ORAL | 5 refills | 30.00000 days
Start: 2024-08-21 — End: 2024-11-02

## 2024-08-28 ENCOUNTER — Ambulatory Visit: Payer: Medicare (Managed Care)

## 2024-09-03 MED ORDER — AZELASTINE 137 MCG (0.1 %) NASAL SPRAY
137 | Freq: Two times a day (BID) | NASAL | 5 refills | 50.00000 days
Start: 2024-09-03 — End: 2024-11-02

## 2024-09-03 NOTE — Telephone Encounter (Signed)
 10/30/2023 Mitchell Juneau, MD  Visit date not found

## 2024-09-06 NOTE — Telephone Encounter (Signed)
 Pt requesting MRI order to be faxed to [facility] in [place], fax #: [phone].

## 2024-09-06 NOTE — Telephone Encounter (Signed)
 Printed order and faxed to [phone]

## 2024-09-06 NOTE — Telephone Encounter (Signed)
 07/03/2024 Mitchell Berke, MD  Visit date not found

## 2024-09-07 ENCOUNTER — Inpatient Hospital Stay: Payer: Medicare (Managed Care)

## 2024-09-09 MED ORDER — TADALAFIL 10 MG TABLET
10 | ORAL_TABLET | Freq: Every day | ORAL | 1 refills | 30.00000 days | PRN
Start: 2024-09-09 — End: 2024-11-12

## 2024-09-11 ENCOUNTER — Ambulatory Visit: Payer: Medicare (Managed Care)

## 2024-10-25 NOTE — Telephone Encounter (Signed)
 07/03/2024 Rocky Berke, MD  Visit date not found

## 2024-10-28 MED ORDER — LISINOPRIL 5 MG TABLET
5 | ORAL_TABLET | Freq: Every day | ORAL | 1 refills | 90.00000 days | Status: AC
Start: 2024-10-28 — End: ?

## 2024-10-28 MED ORDER — ATORVASTATIN 20 MG TABLET
20 | ORAL_TABLET | Freq: Every day | ORAL | 1 refills | 90.00000 days | Status: AC
Start: 2024-10-28 — End: ?

## 2024-10-31 ENCOUNTER — Emergency Department: Admit: 2024-10-31 | Payer: Medicare (Managed Care)

## 2024-10-31 ENCOUNTER — Inpatient Hospital Stay
Admit: 2024-10-31 | Discharge: 2024-10-31 | Disposition: A | Discharge status: Home / Self Care | Payer: Medicare (Managed Care) | Arrived: VH

## 2024-10-31 DIAGNOSIS — R5381 Other malaise: Principal | ICD-10-CM

## 2024-10-31 DIAGNOSIS — R5383 Other fatigue: Secondary | ICD-10-CM

## 2024-10-31 LAB — BASIC METABOLIC PANEL
Anion Gap: 6 mmol/L (ref 3–16)
BUN: 13 mg/dL (ref 7–25)
CO2: 28 mmol/L (ref 21–33)
Calcium: 9.1 mg/dL (ref 8.6–10.3)
Chloride: 106 mmol/L (ref 98–110)
Creatinine: 0.7 mg/dL (ref 0.60–1.30)
EGFR: >90
Glucose: 106 mg/dL — ABNORMAL HIGH (ref 70–100)
Osmolality, Calculated: 291 mosm/kg (ref 278–305)
Potassium: 4.2 mmol/L (ref 3.5–5.3)
Sodium: 140 mmol/L (ref 133–146)

## 2024-10-31 LAB — HIGH SENSITIVITY TROPONIN: High Sensitivity Troponin: 5 ng/L (ref 0–20)

## 2024-10-31 LAB — INFLUENZA A AND B, COVID, RSV COMBINATION ASSAY, NAA
Influenza A: NEGATIVE
Influenza B: NEGATIVE
RSV: NEGATIVE
SARS-CoV-2: NEGATIVE

## 2024-10-31 LAB — CBC
Hematocrit: 40.5 % (ref 38.5–50.0)
Hemoglobin: 13.6 g/dL (ref 13.2–17.1)
MCH: 33.1 pg — ABNORMAL HIGH (ref 27.0–33.0)
MCHC: 33.5 g/dL (ref 32.0–36.0)
MCV: 98.7 fL (ref 80.0–100.0)
MPV: 10 fL (ref 7.5–11.5)
Platelets: 183 10E3/uL (ref 140–400)
RBC: 4.1 10E6/uL — ABNORMAL LOW (ref 4.20–5.80)
RDW: 13.2 % (ref 11.0–15.0)
WBC: 9.8 10E3/uL (ref 3.8–10.8)

## 2024-10-31 LAB — DIFFERENTIAL
Basophils Absolute: 78 /uL (ref 0–108)
Basophils Relative: 0.8 % (ref 0.0–1.0)
Eosinophils Absolute: 88 /uL (ref 0–864)
Eosinophils Relative: 0.9 % (ref 0.0–8.0)
Lymphocytes Absolute: 1784 /uL (ref 570–4860)
Lymphocytes Relative: 18.2 % (ref 15.0–45.0)
Monocytes Absolute: 490 /uL (ref 0–1296)
Monocytes Relative: 5 % (ref 0.0–12.0)
Neutrophils Absolute: 7360 /uL (ref 1520–8640)
Neutrophils Relative: 75.1 % (ref 40.0–80.0)
nRBC: 0 /100{WBCs} (ref 0–0)

## 2024-10-31 NOTE — ED Notes (Signed)
 Bed: C22W  Expected date:   Expected time:   Means of arrival: Car  Comments:

## 2024-10-31 NOTE — ED Provider Notes (Signed)
 [facility] Emergency Department Note    Reason for Visit: Mitchell Evans is a pleasant 64 y.o. male with a history of mitral valve regurgitation, hypertension and cervical spinal stenosis presenting to the emergency department with a chief complaint of dizziness and weakness.     Patient History     HPI: Patient reports for the last week he has been dealing with dizziness described as lightheadedness.  Seems to notice this mainly when standing up or changing positions.  When he tries to walk after this, he feels slightly unsteady on his feet and then improves after a few minutes.  Denies chest pain or shortness of breath associated with his symptoms.  Does report some weakness to the left side of his body which is baseline for him, denies any worsening to this weakness.  Denies weakness elsewhere.  Denies headaches or changes to vision.  Denies history of stroke.    With the exception of the above, there are no aggravating or alleviating factors.    Medical History:   Past Medical History:   Diagnosis Date    Cervical radiculopathy 02/19/2024    Elevated prostate specific antigen (PSA) 08/02/2023    Essential hypertension     Hepatitis C 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 08/16/2021    Needs confirmed    High blood pressure     History of suicidal ideation     Hyperlipidemia     Low serum HDL 08/16/2021    [name] (left ventricular hypertrophy) 11/28/2022    Nonrheumatic mitral valve regurgitation 11/28/2022    Normocytic anemia 11/09/2021    Prediabetes 08/16/2021    Spinal stenosis of cervical region      Surgical History:   Past Surgical History:   Procedure Laterality Date    BACK SURGERY  2003    CERVICAL FUSION      KNEE SURGERY      SPINE SURGERY  2003    WISDOM TOOTH EXTRACTION       Medications: No current facility-administered medications for this encounter.    Current Outpatient Medications:     atorvastatin  (LIPITOR ) 20 MG tablet, Take 1 tablet (20 mg total) by mouth  daily., Disp: 90 tablet, Rfl: 1    azelastine  (ASTELIN ) 137 mcg (0.1 %) nasal spray, Use 1 spray into each nostril 2 times a day. Use in each nostril as directed, Disp: 30 mL, Rfl: 5    celecoxib  (CELEBREX ) 200 MG capsule, Take 1 capsule (200 mg total) by mouth 2 times a day., Disp: 60 capsule, Rfl: 5    cholecalciferol, vitamin D3, 125 mcg (5,000 unit) Tab, Take by mouth., Disp: , Rfl:     DULoxetine  (CYMBALTA ) 60 MG capsule, Take 1 capsule (60 mg total) by mouth daily., Disp: 90 capsule, Rfl: 0    lidocaine  (LIDODERM ) 5 %, Place 1 patch onto the skin daily. Apply patch for 12 hours and then remove patch and leave off for 12 hours., Disp: 30 patch, Rfl: 0    lisinopriL  (PRINIVIL ) 5 MG tablet, Take 2 tablets (10 mg total) by mouth daily., Disp: 180 tablet, Rfl: 1    methocarbamoL  (ROBAXIN ) 500 MG tablet, Take 1 tablet (500 mg total) by mouth at bedtime as needed., Disp: 90 tablet, Rfl: 0    multivitamin capsule, Take 1 capsule by mouth daily., Disp: , Rfl:     psyllium 0.52 gram capsule, Take 1 capsule (0.52 g total) by mouth daily., Disp: , Rfl:     tadalafiL  (CIALIS ) 10 MG tablet, Take 1-2  tablets (10-20 mg total) by mouth daily as needed for Erectile Dysfunction., Disp: 90 tablet, Rfl: 1    tamsulosin  (FLOMAX ) 0.4 mg Cap, Take 1 capsule (0.4 mg total) by mouth daily., Disp: 90 capsule, Rfl: 1  Social History: Mitchell Evans  reports that he has never smoked. He has been exposed to tobacco smoke. He has never used smokeless tobacco. He reports that he does not currently use alcohol. He reports that he does not currently use drugs after having used the following drugs: Marijuana and Medical Marijuana.  Allergies:   Allergies as of 10/31/2024    (No Known Drug Allergies or Adverse Reactions)        Review of Systems     Pertinent positive and negative findings as documented throughout, otherwise all other systems were reviewed and negative     Physical Exam     Vitals:    10/31/24 1421 10/31/24 1422 10/31/24 1500  10/31/24 1712   BP:  169/82 169/82 146/85   BP Location:   Right upper arm Right upper arm   Patient Position:   Lying Lying   BP Cuff Size:   Regular Regular   Pulse: 71  68 62   Resp: 14  17 19    Temp: 98.2 ??F (36.8 ??C)      TempSrc: Oral      SpO2: 90%  94% 98%   Weight: 201 lb (91.2 kg)      Height: 5' 11 (1.803 m)        Physical Exam  General: Seated comfortably in stretcher  Head: Atraumatic.  Eyes:  Sclera anicteric. No conjunctival injection.  PERRLA.  ENT:  Mucous membranes moist.  Neck/Back:  Supple.  Range of motion intact.  Chest/Pulmonary:   No respiratory distress.  Lungs CTA bilaterally.  Cardiac:  Regular rate.  Regular rhythm.  Vascular:  Extremities warm and perfused.  Extremities:  No peripheral edema.  5 out of 5 strength throughout upper and lower extremities.  Patient able to ambulate without disequilibrium or balance issues.  Abdomen: Non-distended.   Neuro: Alert and oriented x 3. No gross neurological deficits.  Psych: Normal affect.  Skin:  No rash.    ED Course   Please see electronic medical record for complete overview of ED course from this visit.     Labs:   Labs Reviewed   BASIC METABOLIC PANEL - Abnormal; Notable for the following components:       Result Value    Glucose 106 (*)     All other components within normal limits   CBC - Abnormal; Notable for the following components:    RBC 4.10 (*)     MCH 33.1 (*)     All other components within normal limits   DIFFERENTIAL   INFLUENZA A AND B, COVID, RSV COMBINATION ASSAY, NAA   HIGH SENSITIVITY TROPONIN        Radiology:  X-ray Chest PA and Lateral   Final Result   IMPRESSION:       No acute findings.      Report Verified by: Neda Poisson, MD at 10/31/2024 4:14 PM EST           EKG: Shows NSR with a rate of 75 bpm and some PVCs in V2 and V3.  No obvious ST changes.    ED Medications Administered:  Medications - No data to display     Other ED Course:         ED Procedures  Procedures    Medical Decision Making      Differential: A wide array of differential diagnosis were considered based on the patient's past medical history and H&P performed in the emergency department, which is reflected in the evaluation with focused laboratory and imaging modalities.      Briefly, Mitchell Evans is a 64 y.o. male who presents to the emergency department with generalized fatigue.  The patient's presentation does not raise significant concerns for any acute conditions.  He is neurologically intact and ambulates without abnormalities making the concern for a stroke low.  Electrolyte disturbances are considered although the patient does not have a history of this.  Metabolic panel without obvious changes.  A viral illness is also considered show viral PCR swabs were performed but these were also negative.  Troponin level was within normal limits and there were no significant findings on EKG to raise concerns for ACS.  A chest x-ray was also without acute findings.  Therefore at this time, the exact source of the patient's symptoms is unknown but there are no acute or significant abnormalities to vitals, physical exam, or laboratory evaluation that would warrant further workup.  Therefore at this time, the patient will be discharged with close PCP follow-up.  Patient notes he has a visit with them later this month and he was encouraged to maintain this visit.  To return to the ED with new or concerning symptoms.    Return precautions discussed in detail as listed in the AVS. Patient expressed a clear understanding and was agreeable to this plan of care. All questions answered thoroughly.     The patient was evaluated by myself and the ED Attending Physician, Dr. Saunders. All management and disposition plans were discussed and agreed upon.     (Portions of this note were dictated using voice recognition software, which may lead to inadvertent typographical errors)     Clinical Impression     1. Malaise and fatigue        Disposition      Discharged from the ED. See AVS for prescriptions, followup, and discharge instructions.     DISCHARGE MEDICATIONS:  Discharge Medication List as of 10/31/2024  5:18 PM          PATIENT [place]ED [place]  [address] [address]  [address]  [place] [address]  [phone]    Schedule an appointment as soon as possible for a visit   As needed, If symptoms worsen      Mont Genin, PA-C  11/01/2024       Mont CHRISTELLA Genin, PA  11/01/24 1750

## 2024-10-31 NOTE — ED Notes (Signed)
 Pt to Xray

## 2024-10-31 NOTE — ED Notes (Signed)
 Pt discharged to home in good condition. Discharge instructions provided to patient and explained. Patient verbalized understanding. Medications reviewed and all of patient's questions and concerns answered. Pt A&Ox4, no signs of acute distress noted. Skin warm and dry. Pt ambulated out of ED without assistance

## 2024-10-31 NOTE — ED Attestation (Signed)
 This patient was seen by the advanced practice provider. I have seen and examined the patient, agree with the workup, evaluation, management and diagnosis. Care plan has been discussed.      I have reviewed the ECG and concur.       History of Present Illness  Mitchell Evans is a 64 year old male who presents with lack of energy and balance issues.    He has experienced a lack of energy and balance issues for the past couple of weeks, initially attributing these symptoms to long work hours and physical exertion. He describes feeling 'off balance' and notes that these symptoms have become more prevalent recently. He has a history of neck fusion surgery and ongoing concerns about his spine, which he believes might be contributing to his balance issues. He continues to work out regularly and has not had previous problems related to his workouts.    He experiences chills and sweating, which were noticed by his coworkers. He recalls having chills while sitting at home with the heat on and also at work, where a colleague commented on his appearance. He reports chills but is unsure about having a fever; he denies cough, chest pain, or trouble breathing.    He mentions having sinus issues for which he takes medication, though he cannot recall the name. He experiences some congestion.    His work schedule is demanding, often working long hours and having difficulty winding down, which he feels contributes to his fatigue. No vomiting or diarrhea, but he notes minor stomach cramps, which he attributes to dietary choices, including eating grass-fed meat and rice. No numbness or tingling reported, but he notes his left side is weak, which he attributes to spinal spurs.    Physical Exam  CONSTITUTIONAL: Well developed, well nourished, no acute distress. Resting comfortably in bed.  EYES: Sclera anicteric, conjunctiva normal.  HEENT: Atraumatic, external ears normal, moist mucous membranes.  NECK: Trachea midline, normal  range of motion.  RESPIRATORY: No respiratory distress. Lungs clear to auscultation.  CARDIOVASCULAR: Regular rate and rhythm. Distal pulses intact.  GI: Soft, nondistended, minimal lower abdominal tenderness without rebound or guarding.  MUSCULOSKELETAL: No edema, no deformities.  SKIN: No rash or nodules noted.  NEUROLOGIC: Awake and alert. Moves all four extremities. Light touch intact. Cranial nerves intact. Extraocular movements intact. Facial symmetry intact. Tongue midline without deviation. Normal finger to nose testing.  Left-sided weakness as compared to the right which is reported as baseline and unchanged.  PSYCHIATRIC: Normal mood. Behavior appropriate.

## 2024-10-31 NOTE — ED Triage Notes (Signed)
 Pt presents to the ED with c/o of weakness and having balance issues for approx 1 week.

## 2024-10-31 NOTE — Discharge Instructions (Addendum)
 Today, you were evaluated at [facility] for fatigue. After our evaluation, you are being discharged from the emergency department. Please follow these instructions for care.     Based on our evaluation, the exact source of your symptoms is not known at this time.  There is no obvious findings on our workup.  Therefore it is recommended that you maintain your visit with your PCP for this month and discuss your symptoms with them as they can order a more extensive workup outpatient.  Please return to the emergency department if you begin to develop any sudden onset severe headaches, changes to your vision or hearing, nausea or vomiting that is uncontrollable, unsteadiness on your feet to the point you are falling over, or new/concerning symptoms.    Please follow up with your primary care provider (PCP) via phone or personal visit within 7 days. If you do not have a PCP, a list of local providers is within your paperwork.

## 2024-11-05 NOTE — Telephone Encounter (Signed)
 07/03/2024 Mitchell Berke, MD  11/06/2024 Mitchell Berke, MD

## 2024-11-06 ENCOUNTER — Other Ambulatory Visit: Admit: 2024-11-06 | Payer: Medicare (Managed Care)

## 2024-11-06 ENCOUNTER — Ambulatory Visit: Admit: 2024-11-06 | Payer: Medicare (Managed Care)

## 2024-11-06 DIAGNOSIS — Z130 Encounter for screening for diseases of the blood and blood-forming organs and certain disorders involving the immune mechanism: Principal | ICD-10-CM

## 2024-11-06 LAB — IRON STUDIES
% Iron Saturation: 34.1 % (ref 15.0–55.0)
Iron: 115 ug/dL (ref 50–212)
TIBC: 337 ug/dL (ref 261–462)

## 2024-11-06 LAB — FERRITIN: Ferritin: 48 ng/mL (ref 23.9–336.2)

## 2024-11-06 LAB — THYROID FUNCTION CASCADE: TSH: 0.42 u[IU]/mL — ABNORMAL LOW (ref 0.45–4.12)

## 2024-11-06 LAB — T3: T3, Total: 110 ng/dL (ref 60.0–220.0)

## 2024-11-06 LAB — T4, FREE: Free T4: 0.87 ng/dL (ref 0.61–1.76)

## 2024-11-06 LAB — VITAMIN B12: Vitamin B-12: 987 pg/mL — ABNORMAL HIGH (ref 180–914)

## 2024-11-06 MED ORDER — DULOXETINE 60 MG CAPSULE,DELAYED RELEASE
60 | ORAL_CAPSULE | Freq: Every day | ORAL | 1 refills | Status: AC
Start: 2024-11-06 — End: ?

## 2024-11-06 MED ORDER — TAMSULOSIN 0.4 MG CAPSULE
0.4 | ORAL_CAPSULE | Freq: Every day | ORAL | 1 refills | Status: AC
Start: 2024-11-06 — End: ?

## 2024-11-06 MED ORDER — CELECOXIB 200 MG CAPSULE
200 | ORAL_CAPSULE | Freq: Two times a day (BID) | ORAL | 5 refills | Status: AC
Start: 2024-11-06 — End: ?

## 2024-11-06 MED ORDER — AZELASTINE 137 MCG (0.1 %) NASAL SPRAY
137 | Freq: Two times a day (BID) | NASAL | 5 refills | Status: AC
Start: 2024-11-06 — End: ?

## 2024-11-06 NOTE — Progress Notes (Signed)
 Pre-Visit Preventative Plannning  Lab Results   Component Value Date    HGBA1C 5.0 07/03/2024     Health Maintenance   Topic Date Due    Depression Monitoring (PHQ-9)  07/03/2025    ASCVD Assessment  07/03/2025    Annual Medicare Wellness Visit (MyChart)  07/03/2025    Comprehensive Physical Exam  07/03/2025    Renal Function/GFR  10/31/2025    Colorectal Cancer Screening (MyChart)  01/31/2027    Diabetes Screening  07/04/2027    Immunization: DTaP/Tdap/Td (2 - Td or Tdap) 11/15/2027    Hepatitis C Screening (MyChart)  Completed    HIV Screening  Completed    Immunization: Influenza (MyChart)  Completed    Immunization: Pneumococcal  Completed    Immunization: Zoster  Completed    Immunization: RSV (Adult)  Completed     Immunization History   Administered Date(s) Administered    COVID-19, mRNA, Moderna monovalent, age 17+ 06/26/2023    COVID-19, mRNA, Pfizer bivalent booster, tris-sucrose, age 17+ 07/17/2021    COVID-19, mRNA, Pfizer monovalent 12/25/2019, 01/22/2020, 07/14/2020, 01/14/2021    COVID-19, mRNA, tris-sucrose, age 11+ (COMIRNATY) 08/04/2022, 07/01/2024    Influenza, MDCK, Trivalent, Preservative-Free 06/27/2024    Influenza, quadrivalent, intradermal, preservative-free 06/26/2023    Influenza, quadrivalent, preservative-free 06/26/2018, 05/31/2019, 06/15/2020, 07/17/2021    Influenza, unspecified 06/28/2022    Pneumococcal conjugate, 20-valent 07/03/2024    Pneumococcal polysaccharide, 23-valent 06/16/2020    RSV, recombinant, prefusion F, adjuvant (Arexvy) 06/28/2022    Zoster, recombinant 09/22/2020, 01/19/2021    tdap 11/14/2017     CC:  Chief Complaint   Patient presents with    Lack Of Energy     Sleep.   Went to ER on Thursday- rbc was low.      HPI: See A&P for details of HPI    ROS: Please see HPI    Past Medical History:  Past Medical History:   Diagnosis Date    Cervical radiculopathy 02/19/2024    Elevated prostate specific antigen (PSA) 08/02/2023    Essential hypertension     Hepatitis C  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 08/16/2021    Needs confirmed    High blood pressure     History of suicidal ideation     Hyperlipidemia     Low serum HDL 08/16/2021    [name] (left ventricular hypertrophy) 11/28/2022    Nonrheumatic mitral valve regurgitation 11/28/2022    Normocytic anemia 11/09/2021    Prediabetes 08/16/2021    Spinal stenosis of cervical region       Current Outpatient Medications   Medication Sig    atorvastatin  Take 1 tablet (20 mg total) by mouth daily.    azelastine  Use 1 spray into each nostril 2 times a day. Use in each nostril as directed    celecoxib  Take 1 capsule (200 mg total) by mouth 2 times a day.    cholecalciferol (vitamin D3) Take by mouth.    DULoxetine  Take 1 capsule (60 mg total) by mouth daily.    lidocaine  Place 1 patch onto the skin daily. Apply patch for 12 hours and then remove patch and leave off for 12 hours.    lisinopril  Take 2 tablets (10 mg total) by mouth daily.    methocarbamol  Take 1 tablet (500 mg total) by mouth at bedtime as needed.    multivitamin Take 1 capsule by mouth daily.    psyllium Take 1 capsule (0.52 g total) by mouth daily.    tadalafil  Take 1-2 tablets (10-20 mg  total) by mouth daily as needed for Erectile Dysfunction.    tamsulosin  Take 1 capsule (0.4 mg total) by mouth daily.     No current facility-administered medications for this visit.       PE:  BP 121/75 (BP Location: Left upper arm, Patient Position: Sitting, BP Cuff Size: Large)   Pulse 82   Resp 16   Ht 5' 11 (1.803 m)   Wt 198 lb (89.8 kg)   SpO2 100%   BMI 27.62 kg/m??   Wt Readings from Last 3 Encounters:   11/06/24 198 lb (89.8 kg)   10/31/24 201 lb (91.2 kg)   08/15/24 202 lb (91.6 kg)     Today's body mass index is 27.62 kg/m??.     General: Well appearing, well developed, well noursed. No acute distress.    Skin: No rashes or suspicious lesions  Cardiac: Regular rate and rhythm. No murmurs rubs or gallops.  Normal S1 and S2.    Respiratory: Clear to auscultation bilateral.  No  respiratory distress.  No Tachypnea  Vascular: No edema  Psych: Alert and oriented.  Normal mood and affect    A/P: Mitchell Evans is a 64 y.o. male with a IN PERSON visit for fatigue and med check - continue current dosages, moving to [place], will do meds for 6 months while est.  Check labs    Mitchell Evans was seen today for lack of energy.    Diagnoses and all orders for this visit:    Screening, iron deficiency anemia (Primary)  -     Iron Studies (Iron + TIBC); Future  -     Ferritin; Future    Chronic fatigue  -     Vitamin B12; Future  -     Thyroid Function Cascade; Future      No follow-ups on file.    Rocky Berke, MD  [facility]

## 2024-11-13 MED ORDER — TADALAFIL 10 MG TABLET
10 | ORAL_TABLET | Freq: Every day | ORAL | 1 refills | 30.00000 days | Status: AC | PRN
Start: 2024-11-13 — End: ?

## 2024-11-13 NOTE — Telephone Encounter (Signed)
 11/06/2024 Rocky Berke, MD  Visit date not found

## 2024-11-14 ENCOUNTER — Ambulatory Visit: Payer: Medicare (Managed Care)

## 2024-11-26 ENCOUNTER — Ambulatory Visit: Admitting: Family Medicine
